# Patient Record
Sex: Female | Born: 1945 | Race: Black or African American | Hispanic: No | Marital: Married | Smoking: Never smoker
Health system: Southern US, Community
[De-identification: ages and names within clinical notes are randomized; demographics above are authoritative.]

## PROBLEM LIST (undated history)

## (undated) DIAGNOSIS — D649 Anemia, unspecified: Secondary | ICD-10-CM

## (undated) DIAGNOSIS — T7840XA Allergy, unspecified, initial encounter: Secondary | ICD-10-CM

## (undated) DIAGNOSIS — Z800 Family history of malignant neoplasm of digestive organs: Secondary | ICD-10-CM

## (undated) DIAGNOSIS — I10 Essential (primary) hypertension: Secondary | ICD-10-CM

## (undated) DIAGNOSIS — M1990 Unspecified osteoarthritis, unspecified site: Secondary | ICD-10-CM

## (undated) HISTORY — DX: Anemia, unspecified: D64.9

## (undated) HISTORY — PX: BUNIONECTOMY: SHX129

## (undated) HISTORY — PX: OTHER SURGICAL HISTORY: SHX169

## (undated) HISTORY — DX: Family history of malignant neoplasm of digestive organs: Z80.0

## (undated) HISTORY — PX: TOE SURGERY: SHX1073

## (undated) HISTORY — PX: COLONOSCOPY: SHX174

## (undated) HISTORY — DX: Allergy, unspecified, initial encounter: T78.40XA

---

## 2006-01-10 HISTORY — PX: JOINT REPLACEMENT: SHX530

## 2007-05-24 ENCOUNTER — Ambulatory Visit: Payer: Self-pay | Admitting: Orthopedic Surgery

## 2007-05-24 DIAGNOSIS — M23302 Other meniscus derangements, unspecified lateral meniscus, unspecified knee: Secondary | ICD-10-CM | POA: Insufficient documentation

## 2007-05-24 DIAGNOSIS — M25469 Effusion, unspecified knee: Secondary | ICD-10-CM | POA: Insufficient documentation

## 2007-05-24 DIAGNOSIS — M25569 Pain in unspecified knee: Secondary | ICD-10-CM | POA: Insufficient documentation

## 2007-05-24 DIAGNOSIS — M1710 Unilateral primary osteoarthritis, unspecified knee: Secondary | ICD-10-CM | POA: Insufficient documentation

## 2007-05-24 DIAGNOSIS — IMO0002 Reserved for concepts with insufficient information to code with codable children: Secondary | ICD-10-CM | POA: Insufficient documentation

## 2007-06-08 ENCOUNTER — Ambulatory Visit (HOSPITAL_COMMUNITY): Admission: RE | Admit: 2007-06-08 | Discharge: 2007-06-08 | Payer: Self-pay | Admitting: Orthopedic Surgery

## 2007-06-14 ENCOUNTER — Ambulatory Visit: Payer: Self-pay | Admitting: Orthopedic Surgery

## 2007-06-21 ENCOUNTER — Ambulatory Visit: Payer: Self-pay | Admitting: Orthopedic Surgery

## 2007-06-21 ENCOUNTER — Ambulatory Visit (HOSPITAL_COMMUNITY): Admission: RE | Admit: 2007-06-21 | Discharge: 2007-06-21 | Payer: Self-pay | Admitting: Orthopedic Surgery

## 2007-06-22 ENCOUNTER — Telehealth: Payer: Self-pay | Admitting: Orthopedic Surgery

## 2007-06-26 ENCOUNTER — Ambulatory Visit: Payer: Self-pay | Admitting: Orthopedic Surgery

## 2007-06-26 DIAGNOSIS — M23349 Other meniscus derangements, anterior horn of lateral meniscus, unspecified knee: Secondary | ICD-10-CM | POA: Insufficient documentation

## 2007-06-28 ENCOUNTER — Encounter: Payer: Self-pay | Admitting: Orthopedic Surgery

## 2007-07-11 ENCOUNTER — Telehealth: Payer: Self-pay | Admitting: Orthopedic Surgery

## 2007-07-19 ENCOUNTER — Ambulatory Visit: Payer: Self-pay | Admitting: Orthopedic Surgery

## 2007-08-16 ENCOUNTER — Telehealth: Payer: Self-pay | Admitting: Orthopedic Surgery

## 2007-08-20 ENCOUNTER — Ambulatory Visit: Payer: Self-pay | Admitting: Orthopedic Surgery

## 2007-09-24 ENCOUNTER — Ambulatory Visit (HOSPITAL_COMMUNITY): Admission: RE | Admit: 2007-09-24 | Discharge: 2007-09-24 | Payer: Self-pay | Admitting: Orthopedic Surgery

## 2007-09-24 ENCOUNTER — Telehealth: Payer: Self-pay | Admitting: Orthopedic Surgery

## 2007-09-24 ENCOUNTER — Ambulatory Visit: Payer: Self-pay | Admitting: Orthopedic Surgery

## 2007-09-24 DIAGNOSIS — I82409 Acute embolism and thrombosis of unspecified deep veins of unspecified lower extremity: Secondary | ICD-10-CM | POA: Insufficient documentation

## 2007-09-27 ENCOUNTER — Telehealth: Payer: Self-pay | Admitting: Orthopedic Surgery

## 2007-09-27 ENCOUNTER — Encounter: Payer: Self-pay | Admitting: Orthopedic Surgery

## 2007-11-28 ENCOUNTER — Ambulatory Visit: Payer: Self-pay | Admitting: Orthopedic Surgery

## 2007-11-28 DIAGNOSIS — M25819 Other specified joint disorders, unspecified shoulder: Secondary | ICD-10-CM | POA: Insufficient documentation

## 2007-11-28 DIAGNOSIS — M25519 Pain in unspecified shoulder: Secondary | ICD-10-CM | POA: Insufficient documentation

## 2007-11-28 DIAGNOSIS — M7580 Other shoulder lesions, unspecified shoulder: Secondary | ICD-10-CM

## 2008-01-28 ENCOUNTER — Telehealth: Payer: Self-pay | Admitting: Orthopedic Surgery

## 2008-01-30 ENCOUNTER — Ambulatory Visit: Payer: Self-pay | Admitting: Orthopedic Surgery

## 2008-01-30 DIAGNOSIS — M79604 Pain in right leg: Secondary | ICD-10-CM | POA: Insufficient documentation

## 2008-01-30 DIAGNOSIS — Q762 Congenital spondylolisthesis: Secondary | ICD-10-CM | POA: Insufficient documentation

## 2008-01-30 DIAGNOSIS — M5430 Sciatica, unspecified side: Secondary | ICD-10-CM | POA: Insufficient documentation

## 2008-01-30 DIAGNOSIS — M4800 Spinal stenosis, site unspecified: Secondary | ICD-10-CM | POA: Insufficient documentation

## 2008-01-30 DIAGNOSIS — M545 Low back pain: Secondary | ICD-10-CM

## 2008-01-30 DIAGNOSIS — M5416 Radiculopathy, lumbar region: Secondary | ICD-10-CM | POA: Insufficient documentation

## 2008-07-02 ENCOUNTER — Telehealth: Payer: Self-pay | Admitting: Orthopedic Surgery

## 2008-10-13 ENCOUNTER — Ambulatory Visit: Payer: Self-pay | Admitting: Orthopedic Surgery

## 2008-11-25 ENCOUNTER — Ambulatory Visit: Payer: Self-pay | Admitting: Orthopedic Surgery

## 2009-04-13 ENCOUNTER — Ambulatory Visit: Payer: Self-pay | Admitting: Orthopedic Surgery

## 2009-08-05 ENCOUNTER — Ambulatory Visit: Payer: Self-pay | Admitting: Orthopedic Surgery

## 2009-11-05 ENCOUNTER — Ambulatory Visit: Payer: Self-pay | Admitting: Orthopedic Surgery

## 2010-02-09 NOTE — Letter (Signed)
Summary: Historic Patient File  Historic Patient File   Imported By: Jacklynn Ganong 05/24/2007 14:57:22  _____________________________________________________________________  External Attachment:    Type:   Image     Comment:   history form

## 2010-02-09 NOTE — Assessment & Plan Note (Signed)
Summary: RT KNEE PAIN/REQ INJECTION/SELF-PAY/CAF   Visit Type:  Follow-up  CC:  right knee pain.  History of Present Illness: 60 years previous arthroscopy RIGHT knee in June of 2009 meniscectomy, had some arthritis  Comes in today complaining of pain in her back and leg as well as knee pain and increasing valgus deformity.  She would like an injection.  Current medications Norco 5 mg for pain  On July 27 I aspirated and injected the knee and it did well until about a week ago  Last film was 2009.  Knee exam shows increasing valgus deformity lateral joint line tenderness tenderness behind the knee and pain with weightbearing in front of the knee and an obvious limp favoring the RIGHT lower extremity.  Fortunately she has maintained her range of motion and strength in the limb and her ligaments are stable  Repeat injection RIGHT knee Verbal consent was obtained. The RIGHT knee was prepped with alcohol and ethyl chloride. 1 cc of depomedrol 40mg /cc and 4 cc of lidocaine 1% was injected. there were no complications.     Allergies: 1)  ! Celebrex   Impression & Recommendations:  Problem # 1:  LOWER LEG, ARTHRITIS, DEGEN./OSTEO (ICD-715.96) Assessment Deteriorated  The following medications were removed from the medication list:    Nabumetone 750 Mg Tabs (Nabumetone) .Marland Kitchen... 1 by mouth two times a day Her updated medication list for this problem includes:    Norco 5-325 Mg Tabs (Hydrocodone-acetaminophen) .Marland Kitchen... 1 by mouth q 4 as needed pain  Orders: Joint Aspirate / Injection, Large (20610) Depo- Medrol 40mg  (J1030)  Patient Instructions: 1)  You have received an injection of cortisone today. You may experience increased pain at the injection site. Apply ice pack to the area for 20 minutes every 2 hours and take 2 xtra strength tylenol every 8 hours. This increased pain will usually resolve in 24 hours. The injection will take effect in 3-10 days.  2)  Please schedule a  follow-up appointment as needed. Prescriptions: NORCO 5-325 MG TABS (HYDROCODONE-ACETAMINOPHEN) 1 by mouth q 4 as needed pain  #60 x 2   Entered and Authorized by:   Fuller Canada MD   Signed by:   Fuller Canada MD on 11/05/2009   Method used:   Print then Give to Patient   RxID:   [id number]    Orders Added: 1)  Joint Aspirate / Injection, Large [20610] 2)  Depo- Medrol 40mg  [J1030]

## 2010-02-09 NOTE — Progress Notes (Signed)
Summary: Patient's knee bothering her  Phone Note Call from Patient   Caller: Patient Summary of Call: Pt called c/o post op knee (from June 2009) "aching", following working 12 hrs on a few days last week; previously has been working reduced hours as advised.  States using ibuprofen. Asking if other recommendation or schedule appointment? If anything needs to be called in, pharmacy is [facility].   Please call pt on CELL # [phone]. Initial call taken by: Cammie Sickle,  January 28, 2008 3:08 PM  Follow-up for Phone Call        come in this week Follow-up by: Chasity Tereasa Coop,  January 28, 2008 3:22 PM  Additional Follow-up for Phone Call Additional follow up Details #1::        advised, and appt scheduled. Additional Follow-up by: Cammie Sickle,  January 28, 2008 4:30 PM

## 2010-02-09 NOTE — Assessment & Plan Note (Signed)
Summary: RT KNEE SWELLING/FLUID/POSS INJEC/[employer]/CAF   Visit Type:  Follow-up  CC:  right knee pain.  History of Present Illness: 60 years previous arthroscopy RIGHT knee in June of 2009 meniscectomy, have some arthritis  Currently on Norco 5 mg as needed for pain  She works 7 days a week complains of recurrent swelling pain and a back of the knee and along the joint line.  Currently using ibuprofen 800 twice a day and ice with equivocal results      Current Medications (verified): 1)  Amlodipine Besy-Benazepril Hcl 5-20 Mg  Caps (Amlodipine Besy-Benazepril Hcl) 2)  Neurontin 100 Mg Caps (Gabapentin) .Marland Kitchen.. 1 By Mouth Three Times A Day 3)  Norco 5-325 Mg Tabs (Hydrocodone-Acetaminophen) .Marland Kitchen.. 1 By Mouth Q 4 As Needed Pain 4)  Nabumetone 750 Mg Tabs (Nabumetone) .Marland Kitchen.. 1 By Mouth Two Times A Day  Allergies (verified): 1)  ! Celebrex  Physical Exam  Extremities:  large knee joint effusion, mildly antalgic gait.  Medial joint line tenderness.  Flexion arc is 120.  Motor strength grade 5.  Ligaments are normal and stable.  Sensation is normal and perfusion and pulse are normal Skin:  intact without lesions or rashes Psych:  alert and cooperative; normal mood and affect; normal attention span and concentration   Impression & Recommendations:  Problem # 1:  LOWER LEG, ARTHRITIS, DEGEN./OSTEO (ICD-715.96) Assessment Deteriorated  Her updated medication list for this problem includes:    Norco 5-325 Mg Tabs (Hydrocodone-acetaminophen) .Marland Kitchen... 1 by mouth q 4 as needed pain    Nabumetone 750 Mg Tabs (Nabumetone) .Marland Kitchen... 1 by mouth two times a day inject RIGHT knee after aspirating 30 cc of clear yellow fluid from a suprapatellar lateral approach  Verbal consent was obtained. The knee was prepped with alcohol and ethyl chloride. 1 cc of depomedrol 40mg /cc and 4 cc of lidocaine 1% was injected. there were no complications.  Orders: Est. Patient Level III (04540) Joint Aspirate / Injection,  Large (20610) Depo- Medrol 40mg  (J1030)  Patient Instructions: 1)  You have received an injection of cortisone today. You may experience increased pain at the injection site. Apply ice pack to the area for 20 minutes every 2 hours and take 2 xtra strength tylenol every 8 hours. This increased pain will usually resolve in 24 hours. The injection will take effect in 3-10 days.  2)  Please schedule a follow-up appointment as needed. Prescriptions: NORCO 5-325 MG TABS (HYDROCODONE-ACETAMINOPHEN) 1 by mouth q 4 as needed pain  #60 x 2   Entered and Authorized by:   Fuller Canada MD   Signed by:   Fuller Canada MD on 08/05/2009   Method used:   Print then Give to Patient   RxID:   [id number]

## 2010-02-09 NOTE — Progress Notes (Signed)
Summary: Call from patient c/o shoulder pain  ---- Converted from flag ---- ---- 07/02/2008 12:43 PM, Cammie Sickle wrote: advised patient. appointment scheduled.  ---- 07/02/2008 9:19 AM, Forde Dandy L Kandis Ban wrote: use ibuprofen and tylenol, use ice or heat which ever feels better, rest her shoulder  ---- 07/02/2008 8:59 AM, Cammie Sickle wrote: pt c/o shoulder pain (had injec+Xr at 11/28/07 visit) - wants injec - I offered her a couple of options for appts in am; states can only come pm Any recommendations in meantime till I can find a slot? ------------------------------

## 2010-02-09 NOTE — Assessment & Plan Note (Signed)
Summary: 2 m RE-CK KNEE/HAD KNEE SURG 06/21/07/BCBS/CAF    History of Present Illness: I saw Kaylee Warner in the office today for a 2 month followup visit.  She is a 65 years old woman with the complaint of:  post op right knee.  Patient states her knee is doing fine. She wants to try some arthritis medicine, but she cannot take Celebrex because it ran her blood pressure up. She aches at night. She tried the Lyrica , but she could not tell if it helped and she lost her prescription.   right knee post arthroscopy, DOS 06-21-07. she did have quite a bit of arthritis but at this point this is stable.  Her main complaint today is right shoulder pain exacerbated by forward elevation worse at night and with overhead activity    Updated Prior Medication List: AMLODIPINE BESY-BENAZEPRIL HCL 5-20 MG  CAPS (AMLODIPINE BESY-BENAZEPRIL HCL)  NABUMETONE 500 MG TABS (NABUMETONE) 1 by mouth two times a day  Current Allergies (reviewed today): No known allergies   Past Medical History:    Reviewed history from 05/24/2007 and no changes required:       htn  Past Surgical History:    Reviewed history from 09/24/2007 and no changes required:       [facility] 2009 Dr Romeo Apple      Review of Systems       improved knee function   Physical Exam  Constitutional: normal appearance   CDV: normal pulse and perfusion  Skin: normal  Neuro: normal sensation  Psyche: awake and alert  MSK:  .right knee examination reveals preserved knee flexion of 120 with near full extension no pain no swelling no tenderness  Right shoulder examination The shoulder is tender over the posterrolateral acromion, there is no swelling, the shoulder is stable, the SubScap is 5/5, the EXT/ROT are 5/5, the SSpinatus is 4/5. The impingement sign is positive. ROM:  FLEXION=  180 vs 150 (P/A)         ABDUCTION=          Impression & Recommendations:  Problem # 1:  SHOULDER PAIN (ICD-719.41) Assessment: New  Right  shoulder Verbal consent obtained/The shoulder was injected with depomedrol 40mg /cc and sensorcaine .25% . There were no complications  The following medications were removed from the medication list:    Norco 5-325 Mg Tabs (Hydrocodone-acetaminophen) .Marland Kitchen... 1-2 by mouth q4 as needed  Her updated medication list for this problem includes:    Nabumetone 500 Mg Tabs (Nabumetone) .Marland Kitchen... 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 1 by mouth two times a day  Other Orders: Est. Patient Level III (56213)   Patient Instructions: 1)  Please schedule a follow-up appointment in 6 months. 2)  x-rays of the right knee  3)  You have received an injection of cortisone today. You may experience increased pain at the injection site. Apply ice pack to the area for 20 minutes every 2 hours and take 2 xtra strength tylenol every 8 hours. This increased pain will usually resolve in 24 hours. The injection will take effect in 3-10 days.    Prescriptions: NABUMETONE 500 MG TABS (NABUMETONE) 1 by mouth two times a day  #60 x 5  Entered and Authorized by:   Fuller Canada MD   Signed by:   Fuller Canada MD on 11/28/2007   Method used:   Print then Give to Patient   RxID:   [id number]  ]

## 2010-02-09 NOTE — Assessment & Plan Note (Signed)
Summary: 4 week recheck knee/post op/bsf    History of Present Illness: I saw Kaylee Warner in the office today for a followup visit.  She is a 65 years old woman with the complaint of:  4 week recheck on right knee one month recheck on right knee post arthroscopy, DOS 06-21-07.   Rx: ice, ibuprofen and celebrex.   Wants refill on hydrocodone 7.5/650, helps.  Celebrex makes her BP run high, stopped taking that.  Right lower leg swelling for a week, no injury.   HISTORY   Last visit was advised to take one celebrex and one ultram a day for knee pain and ice for the swelling since she wanted to return to work early.  The fluid came back last week after working 6 hrs straight.  She is very clear that her preop pain has improved. We talked about surgical options in the future and we both agreed that she would like to keep this nonsurgical at least for 3 years; we may or may not make it to that point      Updated Prior Medication List: AMLODIPINE BESY-BENAZEPRIL HCL 5-20 MG  CAPS (AMLODIPINE BESY-BENAZEPRIL HCL)  NORCO 5-325 MG TABS (HYDROCODONE-ACETAMINOPHEN) 1-2 by mouth q4 as needed  Current Allergies: No known allergies   Past Medical History:    Reviewed history from 05/24/2007 and no changes required:       htn  Past Surgical History:    Reviewed history from 05/24/2007 and no changes required:       [facility] 2009 Dr Romeo Apple       Physical Exam  The knee actually looks good  No effusion No tenderness  the leg from the back of knee down is tender, in the calf and in the leg  the pulses are intact and both feet seem cool to touch     Impression & Recommendations:  Problem # 1:  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 Patient Level III (47829)   Medications Added to Medication List This Visit: 1)  Norco 5-325 Mg Tabs (Hydrocodone-acetaminophen) .Marland Kitchen.. 1-2 by mouth q4 as needed   Patient Instructions: 1)   Ultrasound for right leg r/o DVT  2)  if positive we will treat  3)  if negative just take the hydrocodone and   4)  return in 2 months    Prescriptions: NORCO 5-325 MG TABS (HYDROCODONE-ACETAMINOPHEN) 1-2 by mouth q4 as needed  #60 x 2   Entered and Authorized by:   Fuller Canada MD   Signed by:   Fuller Canada MD on 09/24/2007   Method used:   Print then Give to Patient   RxID:   [id number]  ]

## 2010-02-09 NOTE — Letter (Signed)
Summary: Surg order RT arth sched 06/21/07  Surg order RT arth sched 06/21/07   Imported By: Cammie Sickle 06/27/2007 17:56:58  _____________________________________________________________________  External Attachment:    Type:   Image     Comment:   External Document

## 2010-02-09 NOTE — Assessment & Plan Note (Signed)
Summary: mri results/disc here/bsf    History of Present Illness: I saw Kaylee Warner in the office today for a followup visit.  She is a 65 years old woman with the complaint of:  MRI results from [facility] on 06-08-07 of right knee.  Knee is somewhat better.  Pain level today is around 7 with standing, walking.  Rest and Ibuprofen helps.   IMPRESSION:   1.  Complex degenerative type tear involving the anterior horn and midbody regions of the lateral meniscus. 2.  Degenerative changes involving the the posterior horn of the medial meniscus without discrete tear. 3.  Intact ligamentous structures and no acute bony findings. 4.  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 4.  Low large joint effusion and moderate sized partially ruptured Baker's cyst. 5.  Septated cystic structure in the posterior joint space is most likely a ganglion cyst.  The   Read By:  Cyndie Chime,  M.D.     Released By:  Pecolia Ades,       Updated Prior Medication List: AMLODIPINE BESY-BENAZEPRIL HCL 5-20 MG  CAPS (AMLODIPINE BESY-BENAZEPRIL HCL)  FEXOFENADINE HCL 180 MG  TABS (FEXOFENADINE HCL)  INDOMETHACIN 50 MG  CAPS (INDOMETHACIN)   Current Allergies: No known allergies   Past Medical History:    Reviewed history from 05/24/2007 and no changes required:       htn  Past Surgical History:    Reviewed history from 05/24/2007 and no changes required:       none   Family History:    Reviewed history from 05/24/2007 and no changes required:       Family History of Diabetes       Family History of Arthritis  Social History:    Reviewed history from 05/24/2007 and no changes required:       Patient is married.        cook   Risk Factors: Tobacco use:  never Caffeine use:  2 drinks per day Alcohol use:  no    Physical Exam  Constitutional: vital signs see recorded values. General: normal development,  nutrition, and grooming. No deformity. Body Habitus is medium. CDV: Observation and palpation was normal  Lymph: palpation of the lymph nodes were normal Skin: inspection and palpation of the skin revealed no abnormalities  Neuro: coordination: normal              DTR's normal              Sensation was normal  Psyche: Mood was normal.  Affect: normal  MSK: Gait: ab- normal    The upper extremities have normal appearance, ROM, strength and stability.   The left knee has range of motion zero 125 with no ligamentous instability no tenderness swelling. Muscle function and tone normal. Alignment seems normal.  In contrast the right knee has swelling 5-10 loss of extension flexion only to 90 joint effusion. Perhaps valgus malalignment. Tenderness over the medial joint line. Positive McMurray sign. Muscle tone is normal.      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  Prognosis guarded due to arthritis.  I discussed this with her.  She agrees to go ahead with surgery   Orders: Est. Patient Level IV (40102)   Medications Added to Medication List This Visit: 1)  Celebrex 200 Mg Caps (Celecoxib) .Marland Kitchen.. 1 daily   Patient Instructions: 1)  Informed consent process: I have discussed the procedure with the patient. I have answered their questions. The risks of bleeding, infection, nerve and vascualr injury have been discussed. The diagnosis and reason for surgery have been explained. The patient demonstrates understanding of this discussion. Specific to this procedure risks include: pain swelling stiffness 2)  surgery June 11, post op June 16   Prescriptions: CELEBREX 200 MG  CAPS (CELECOXIB) 1 daily  #60 x 5   Entered and Authorized by:   Fuller Canada MD   Signed by:   Fuller Canada MD on 06/14/2007    Method used:   Print then Give to Patient   RxID:   [id number]  ]

## 2010-02-09 NOTE — Assessment & Plan Note (Signed)
Summary: RT SHOULDER PAIN,REQ INJEC/CAF   Visit Type:  Follow-up  CC:  rt shoulder pain .  History of Present Illness: I saw Kaylee Warner in the office today for a followup visit.  She is a 65 years old woman with the complaint of:  right shoulder pain.  The patient requests an injection for what is presumed to be shoulder impingement syndrome  She is also on Relafen 750 mg Norco 5 mg for pain and needs a refill  She continues to have night pain and pain with forward elevation.  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  She does have full passive range of motion there is some pain in the arc of motion of 120 plus, and the shoulder remained stable, the supraspinatus strength remains normal.  Skin is intact.  She is oriented times per some placement and affect are normal.    RIGHT shoulder subacromial injection Verbal consent obtained/The shoulder was injected with depomedrol 40mg /cc and sensorcaine .25% . There were no complications  Exercise program emphasized Norco for pain Follow up as needed    Allergies: 1)  ! 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 Patient Level III (28413) Joint Aspirate / Injection, Large (20610) Depo- Medrol 40mg  (J1030)  Problem # 2:  SHOULDER PAIN (ICD-719.41) Assessment: Deteriorated  Her updated medication list for this problem includes:    Norco 5-325 Mg Tabs (Hydrocodone-acetaminophen) .Marland Kitchen... 1 by mouth q 4 as needed pain    Nabumetone 750 Mg Tabs (Nabumetone) .Marland Kitchen... 1 by mouth two times a day  Orders: Est. Patient Level III (24401) Joint Aspirate / Injection, Large (20610) Depo- Medrol 40mg  (J1030)  Patient Instructions: 1)  You have received an injection of cortisone today. You  may experience increased pain at the injection site. Apply ice pack to the area for 20 minutes every 2 hours and take 2 xtra strength tylenol every 8 hours. This increased pain will usually resolve in 24 hours. The injection will take effect in 3-10 days.  2)  Please schedule a follow-up appointment as needed. Prescriptions: NORCO 5-325 MG TABS (HYDROCODONE-ACETAMINOPHEN) 1 by mouth q 4 as needed pain  #40 x 1   Entered and Authorized by:   Fuller Canada MD   Signed by:   Fuller Canada MD on 04/13/2009   Method used:   Print then Give to Patient   RxID:   [id number]

## 2010-02-09 NOTE — Assessment & Plan Note (Signed)
Summary: POST OP 1/ARTH RT KNEE/CAF    History of Present Illness: I saw Kaylee Warner in the office today for a followup visit.  She is a 65 years old woman with the complaint of:  post op #1 right knee.  DOS 06-21-07. Right knee arthroscopy, partial lateral meniscectomy.  Patient states that she feels okay, she has not started physical therapy.  She is taking lorcet plus and she has constipation.    Current Allergies: No known allergies       Physical Exam  the patient's knee is slightly swollen but looks good. Dressing changed ace wrap applied.    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 1 daily  Orders: Post-Op Check (84132)   Problem # 3:  DERANGEMENT OF ANTERIOR HORN OF LATERAL MENISCUS (ICD-717.42) Assessment: Improved  Orders: Post-Op Check (44010)   Medications Added to Medication List This Visit: 1)  Colace 100 Mg Caps (Docusate sodium) .Marland Kitchen.. 1 by mouth two times a day   Patient Instructions: 1)  take Milk of Magnesia 30 cc daily until bowel movement  2)  Resume celebrex  3)  Colace new prescription  4)  start PT  5)  return 3 weeks   Prescriptions: COLACE 100 MG  CAPS (DOCUSATE SODIUM) 1 by mouth two times a day  #60 x 0   Entered and Authorized by:   Fuller Canada MD   Signed by:   Fuller Canada MD on 06/26/2007   Method used:   Print then Give to Patient   RxID:   [id number] COLACE 100 MG  CAPS (DOCUSATE SODIUM) 1 by mouth two times a day  #60 x 0   Entered and Authorized by:   Fuller Canada MD   Signed by:   Fuller Canada MD on 06/26/2007   Method used:   Print then Give  to Patient   RxID:   [id number]  ]

## 2010-02-09 NOTE — Medication Information (Signed)
Summary: Tax adviser   Imported By: Elvera Maria 10/12/2007 09:01:57  _____________________________________________________________________  External Attachment:    Type:   Image     Comment:   hose

## 2010-02-09 NOTE — Progress Notes (Signed)
Summary: RE: U/S Results per Dr Romeo Apple  ---- Converted from flag ---- ---- 09/24/2007 2:00 PM, Cammie Sickle wrote: BEST PH # TO CALL PT W/ RESULTS U/S - Cell# [phone] ------------------------------  Phone Note Outgoing Call   Call placed to: Patient Summary of Call: As per Dr Alba Destine of U/S: results negative. Per Dr Romeo Apple, call pt to report:  Done Initial call taken by: Cammie Sickle,  September 24, 2007 5:30 PM

## 2010-02-09 NOTE — Assessment & Plan Note (Signed)
Summary: RE-CK RT SHOULDER/[employer]BS/CAF   Visit Type:  Follow-up  CC:  shoulder.  History of Present Illness: I saw Kaylee Warner in the office today for a followup visit.  She is a 65 years old woman with the complaint of:    DX: Right shoulder Impingement Syndrome.  Treatment: injection 6 weeks ago.  MEDS:  Norco 5 and Relafen 750 two times a day, doing good with the meds.  Complaints: doing better, around 80 percent better, ROM is better.  Today, scheduled for:  6 week recheck right shoulder after injection.   Allergies: 1)  ! Celebrex  Physical Exam  Additional Exam:  full forward elevation, abduction, external rotation with mild decrease in internal rotation RIGHT shoulder. No tenderness.   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 Patient Level II (04540)  Patient Instructions: 1)  Please schedule a follow-up appointment as needed.

## 2010-02-09 NOTE — Assessment & Plan Note (Signed)
Summary: RT KNEE PAIN,SWELLING/NO FILM/BCBS/CAF   Vital Signs:  Patient Profile:   65 Years Old Female Weight:      193 pounds Pulse rate:   88 / minute Resp:     16 per minute  Vitals Entered By: Fuller Canada MD (May 24, 2007 9:25 AM)                 Chief Complaint:  right knee pain.  History of Present Illness: I saw Kaylee Warner in the office today for an initial visit.  She is a 42 years black female with the complaint of:  right knee pain  05-24-07 went to [facility] and had fluid drawn off of knee       This is a 65 year old woman who presents with 3 weeks ofknee pain, no trauma.  The patient complains of left knee pain, but denies right knee pain, bilateral knee pain, left hip pain, right hip pain, bilateral hip pain, left ankle pain, right ankle pain, and bilateral ankle pain.  Patient notes history of grinding, swelling..  Previous treatment has included seen in [facility] hospital, seen by Primary Ross Ludwig still where she received an injection, and medications:ibuprofen 800 mg which helps, also received Indocin 50 mg which made her feel funny.  Knee pain improves with ibuprofen: and ice.  Diagnostics to date include plain films.      Updated Prior Medication List: AMLODIPINE BESY-BENAZEPRIL HCL 5-20 MG  CAPS (AMLODIPINE BESY-BENAZEPRIL HCL)  FEXOFENADINE HCL 180 MG  TABS (FEXOFENADINE HCL)  INDOMETHACIN 50 MG  CAPS (INDOMETHACIN)   Current Allergies (reviewed today): No known allergies   Past Medical History:    htn  Past Surgical History:    none   Family History:    Family History of Diabetes    Family History of Arthritis  Social History:    Patient is married.     cook   Risk Factors:  Tobacco use:  never Caffeine use:  2 drinks per day Alcohol use:  no   Review of Systems  General      Complains of weight gain.      Denies weight loss, fever, chills, and fatigue.  Cardiac      Complains of poor circulation.      Denies chest pain,  angina, heart attack, heart failure, blood clots, and phlebitis.  Resp      Denies short of breath, difficulty breathing, COPD, cough, and pneumonia.  GI      Complains of constipation and difficulty swallowing.      Denies nausea, vomiting, diarrhea, ulcers, GERD, and reflux.  GU      Denies kidney failure, kidney transplant, kidney stones, burning, poor stream, testicular cancer, blood in urine, and .  Neuro      Complains of dizziness.      Denies headache, migraines, numbness, weakness, tremor, and unsteady walking.  MS      Complains of joint pain and joint swelling.      Denies rheumatoid arthritis, gout, bone cancer, osteoporosis, and .  Endo      Denies thyroid disease, goiter, and diabetes.  Psych      Complains of depression.      Denies mood swings, anxiety, panic attack, bipolar, and schizophrenia.  Derm      Denies eczema, cancer, and itching.  EENT      Denies poor vision, cataracts, glaucoma, poor hearing, vertigo, ears ringing, sinusitis, hoarseness, toothaches, and bleeding gums.  Immunology  Complains of sinus problems.      Denies seasonal allergies and allergic to bee stings.  Lymphatic      Denies lymph node cancer and lymph edema.   Physical Exam  Constitutional: vital signs see recorded values. General: normal development, nutrition, and grooming. No deformity. Body Habitus is medium. CDV: Observation and palpation was normal  Lymph: palpation of the lymph nodes were normal Skin: inspection and palpation of the skin revealed no abnormalities  Neuro: coordination: normal              DTR's normal              Sensation was normal  Psyche: Mood was normal.  Affect: normal  MSK: Gait: ab- normal    The upper extremities have normal appearance, ROM, strength and stability.   The left knee has range of motion zero 125 with no ligamentous instability no tenderness swelling. Muscle function and tone normal. Alignment seems normal.  In  contrast the right knee has swelling 5-10 loss of extension flexion only to 90 joint effusion. Perhaps valgus malalignment. Tenderness over the medial joint line. Positive McMurray sign. Muscle tone is normal.      Impression & Recommendations:  Problem # 1:  KNEE PAIN (BJY-782.95) Assessment: New  Her updated medication list for this problem includes:    Indomethacin 50 Mg Caps (Indomethacin)  Orders: New Patient Level IV (62130) Knee x-ray,  3 views (86578)   Problem # 2:  JOINT EFFUSION, RIGHT KNEE (ICD-719.06) Assessment: New  Orders: New Patient Level IV (46962)   Problem # 3:  DERANGEMENT MENISCUS (ICD-717.5)  Orders: New Patient Level IV (95284)   Problem # 4:  LOWER LEG, ARTHRITIS, DEGEN./OSTEO (ICD-715.96) Assessment: New  Her updated medication list for this problem includes:    Indomethacin 50 Mg Caps (Indomethacin)  Orders: New Patient Level IV (13244) Knee x-ray,  3 views (73562)radiographs taken in the office. Degenerative changes are seen the patellofemoral joint there may be some degenerative changes in the tibiofemoral joint as well. impression is arthritis.  Plan: continue to ice, ibuprofen, relative rest. MRI to evaluate for meniscal tear with strong clinical symptoms and failure of ibuprofen, Indocin, injection.  Orders: New Patient Level IV (01027) Knee x-ray,  3 views (25366)   Medications Added to Medication List This Visit: 1)  Amlodipine Besy-benazepril Hcl 5-20 Mg Caps (Amlodipine besy-benazepril hcl) 2)  Fexofenadine Hcl 180 Mg Tabs (Fexofenadine hcl) 3)  Indomethacin 50 Mg Caps (Indomethacin)   Patient Instructions: 1)  Continue ice and ibuprofen  2)  MRI will be scheduled after the mri you will come back here for results and plan of treatment    ]

## 2010-02-24 ENCOUNTER — Telehealth: Payer: Self-pay | Admitting: Orthopedic Surgery

## 2010-03-03 NOTE — Progress Notes (Signed)
Summary: refill on norco 5 for a month  Phone Note Call from Patient   Caller: Kaylee Warner Summary of Call: would like to know if you can send her pain medication into [facility].  She states they have sent a fax however Steward Drone or I saw the fax.  Her number is [phone], and she said she was waiting on Medicaid to kick in before she came in to see the Doctor. Initial call taken by: Curtis Sites,  February 24, 2010 4:11 PM    Prescriptions: NORCO 5-325 MG TABS (HYDROCODONE-ACETAMINOPHEN) 1 by mouth q 4 as needed pain  #60 x 2   Entered by:   Ether Griffins   Authorized by:   Fuller Canada MD   Signed by:   Ether Griffins on 02/24/2010   Method used:   Telephoned to ...         RxID:   [id number]

## 2010-04-26 ENCOUNTER — Encounter: Payer: Self-pay | Admitting: Orthopedic Surgery

## 2010-05-12 ENCOUNTER — Ambulatory Visit: Payer: Self-pay | Admitting: Orthopedic Surgery

## 2010-05-12 ENCOUNTER — Encounter: Payer: Self-pay | Admitting: Orthopedic Surgery

## 2010-05-25 NOTE — H&P (Signed)
NAME:  Kaylee Warner, Kaylee Warner               ACCOUNT NO.:  [id number]   MEDICAL RECORD NO.:  [id number]          PATIENT TYPE:  AMB   LOCATION:  DAY                           FACILITY:  [facility]   PHYSICIAN:  Vickki Hearing, M.D.DATE OF BIRTH:  05/14/1945   DATE OF ADMISSION:  DATE OF DISCHARGE:  LH                              HISTORY & PHYSICAL   CHIEF COMPLAINT:  Right knee pain.  Surgery scheduled for June 21, 2007,  for arthroscopy right knee at [facility].   This is a 65 year old black female who started having right knee pain  back in May 2009.  She was seen in the emergency room on 1 occasion to  have fluid drawn from the knee.  She complains of gradual onset of pain  without trauma,  denies any radicular symptoms.  Notes history of  grinding and swelling.  She has also received an injection, been taking  ibuprofen, received Indocin.  She had plain films as well, which showed  mild degenerative changes in the patellofemoral joint and mild  degenerative changes in the tibiofemoral joint.  She was evaluated with  MRI, which was reviewed and showed complex degenerative tear anterior  horn and mid body of lateral meniscus, degenerative changes in the  posterior horn of the medial meniscus, but no tear.  Ligaments intact.  Three compartment arthritis, especially lateral and patellofemoral  joint.  Large joint effusion with moderate-sized partially ruptured  Baker's cyst.   Informed consent was done in the office.  Risk of stiffness, swelling,  and continued pain from arthritis and possible retear of meniscus noted.   MEDICATIONS:  1. Amlodipine 5-20 mg daily.  2. Fexofenadine 180 mg.   ALLERGIES:  No known drug allergies.   MEDICAL HISTORY:  Hypertension.   SURGICAL HISTORY:  No previous surgery.   FAMILY HISTORY:  Diabetes and arthritis.   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  No tobacco use.  Two drinks per day of caffeine.  No  alcohol use.   REVIEW OF SYSTEMS:  Denied constitutional symptoms,  cardiac,  respiratory, and GI symptoms with exception of occasional constipation  and difficulty swallowing.  Denied GU symptoms, complained of some  dizziness, joint pain, and depression.  Denied thyroid disease, goiter,  diabetes, eczema, cancer, itching, ear, nose, and throat problems.  Denies seasonal allergies.  Does have some sinusitis.  Lymph system:  Denied cancer and lymphedema.   PHYSICAL EXAM:  VITAL SIGNS:  Weight 193, pulse 88, respiratory rate 16.  CONSTITUTIONAL SIGNS:  Include normal development, nutrition, grooming,  and hygiene.  No deformity.  Body habitus medium.  CARDIOVASCULAR EXAM:  Showed normal palpation with good pulse perfusion.  Observed no swelling.  LYMPH NODES:  Negative.  SKIN INSPECTION:  Palpation revealed no abnormalities.  SENSATION:  Intact.  PSYCH:  Mood and affect were normal.  NEUROLOGIC:  She was alert and oriented x3.  Gait was abnormal.  She  favored the right leg with antalgic gait pattern.  EXTREMITIES:  Upper extremities were normal in appearance.  Range  of  motion, strength, stability.  Deep tendon reflexes were normal.   Left knee range of motion was 125.  No ligamentous instability, no  tenderness, swelling; and muscle function and tone were normal.  Alignment seemed normal.   In contrast, the right knee showed swelling with a 5-10 degrees loss of  extension, flexion to 90 degrees perhaps valgus malalignment.  Tenderness over the medial lateral joint lines with positive McMurray  sign.  Muscle tone was normal.   IMPRESSION:  1. Knee pain.  2. Joint effusion.  3. Derangement of meniscus  4. Arthritis.   PLAN:  1. Arthroscopy, right knee.  2. Partial lateral meniscectomy.   The patient will follow up on next Tuesday, June 26, 2007.      Vickki Hearing, M.D.  Electronically Signed     SEH/MEDQ  D:  06/20/2007  T:  06/21/2007  Job:  [id number]

## 2010-05-25 NOTE — Op Note (Signed)
NAME:  Kaylee Warner, Kaylee Warner NO.:  [id number]   MEDICAL RECORD NO.:  [id number]          PATIENT TYPE:  AMB   LOCATION:  DAY                           FACILITY:  [facility]   PHYSICIAN:  Vickki Hearing, M.D.DATE OF BIRTH:  1945-05-02   DATE OF PROCEDURE:  06/21/2007  DATE OF DISCHARGE:                               OPERATIVE REPORT   The history is as follows; this is a 65 year old female who was started  having right knee pain in May 2009.  She was seen in the emergency room  on two occasions.  On one occasion, an aspiration of the knee revealed  inflammatory fluid.  She complained of gradual onset of pain with no  major trauma, denied any radicular pain, had grinding swelling.  Symptoms were unrelieved by injection, ibuprofen, Indocin, rest, and  activity modification.   MRI showed a complex degenerative tear in anterior horn and body of  lateral meniscus with some degenerative changes in the three  compartments, especially lateral and patellofemoral.   PREOPERATIVE DIAGNOSES:  Lateral meniscal tear, osteoarthritis of the  right knee.   POSTOPERATIVE DIAGNOSIS:  Lateral meniscal tear, osteoarthritis of the  right knee.   PROCEDURES:  1. Arthroscopy right knee.  2. Partial lateral meniscectomy.   SURGEON:  Vickki Hearing, MD.   ANESTHETIC:  General by LMA.   OPERATIVE FINDINGS:  There was a complex tear of the mid body and  anterior horn of the lateral meniscus associated with grade 4 changes of  the lateral tibial plateau and grade 2 changes of the lateral femoral  condyle and medial compartment was essentially clean with grade 4 and 3  changes of the trochlea and patella respectively.   COMPLICATIONS:  None.   BLOOD LOSS:  Minimal.   COUNTS:  Correct.   The procedure was done as follows; the patient was identified as Kaylee Warner.  Her right knee was marked by the patient and countersigned by  the surgeon.  The history and physical was updated.   The patient was  taken to surgery, had IV antibiotics 1 g of Ancef and general LMA  anesthesia.  After successful induction of anesthesia, the right leg was  placed in an arthroscopic leg holder.  The left leg was placed and  padded in a well-leg holder.   The right lower extremity was then prepped with DuraPrep and draped  sterilely.   The time-out procedure was then performed.  The planned arthroscopy of  the right knee with lateral meniscal tear was verbalized along with  confirmation of antibiotics equipment imaging and all agreed.   The portal sites were injected with dilute Marcaine with epinephrine  solution.  A lateral portal was established with an 11 blade.  The scope  was introduced into the lateral compartment to the medial compartment  where diagnostic arthroscopy was started and completed by touring the  knee.  Through the medial portal, a probe was placed and the intra-  articular structures were evaluated visually and manually with a probe.  The lateral meniscal tear was flipped into the notch and then  a portion  was still folded onto the body of the meniscus.  The lateral tibial  plateau had a grade 4 fissure and the lateral femoral condyle had  diffuse grade 3 changes.   The patellofemoral joint was then evaluated where a grade 4 changes were  noted on the trochlea.  A grade 2 changes were noted on the patella.   Using a combination of instruments which included a duckbill forceps,  curved shaver, and 90-degree ArthroCare wand, the meniscal tear was  resected.  The meniscus was balanced until a stable rim was obtained.  No major chondroplasties were done.  The knee was irrigated and closed  with Steri-Strips.  We did inject Marcaine with epinephrine into the  joint, applied sterile dressings, Ace bandage, and cryo cuff, and the  patient was extubated and taken to recovery room in stable condition.   PLAN:  Physical therapy to be ordered on Tuesday during her  followup to  be done at [facility].  She was given Lorcet Plus and Phenergan for pain  and nausea.  She is instructed to remove her dressings tomorrow, apply  Band-Aids, and start range of motion exercises.  She is full  weightbearing with a walker.      Vickki Hearing, M.D.  Electronically Signed     SEH/MEDQ  D:  06/21/2007  T:  06/22/2007  Job:  [id number]

## 2010-06-22 ENCOUNTER — Ambulatory Visit (INDEPENDENT_AMBULATORY_CARE_PROVIDER_SITE_OTHER): Payer: Medicare Other | Admitting: Orthopedic Surgery

## 2010-06-22 DIAGNOSIS — M719 Bursopathy, unspecified: Secondary | ICD-10-CM

## 2010-06-22 DIAGNOSIS — M67919 Unspecified disorder of synovium and tendon, unspecified shoulder: Secondary | ICD-10-CM

## 2010-06-22 DIAGNOSIS — M75101 Unspecified rotator cuff tear or rupture of right shoulder, not specified as traumatic: Secondary | ICD-10-CM | POA: Insufficient documentation

## 2010-06-22 MED ORDER — NABUMETONE 750 MG PO TABS: 750.0000 mg | ORAL_TABLET | Freq: Two times a day (BID) | ORAL | Status: DC

## 2010-06-22 MED ORDER — HYDROCODONE-ACETAMINOPHEN 5-325 MG PO TABS: 1.0000 | ORAL_TABLET | ORAL | Status: AC | PRN

## 2010-06-22 NOTE — Progress Notes (Signed)
   Followup visit.  RIGHT shoulder pain.  Previous imaging x-rays no fracture, dislocation, or arthritis.  Impression rotator cuff syndrome.  Returns today for possible repeat injection.  Painful forward elevation, no real weakness. No neck pain no numbness no tingling.  Review of systems otherwise negative.  Forward elevation strength is normal. Positive impingement sign. Neurovascular exam is normal.  RIGHT shoulder subacromial injection.  Continue Relafen and Norco for pain.  Followup as needed

## 2010-06-22 NOTE — Procedures (Signed)
Separately identifiable procedure report  Informed consent was obtained verbally.  Time out was taken.  RIGHT shoulder was injected subacromial space.   Alcohol was used to prep the shoulder, along with ethyl chloride.  40 mg of Depo-Medrol and 3 cc of 1% lidocaine was injected into the subacromial space.  There were no complications 

## 2010-06-22 NOTE — Patient Instructions (Signed)
You have received a steroid shot. 15% of patients experience increased pain at the injection site with in the next 24 hours. This is best treated with ice and tylenol extra strength 2 tabs every 8 hours. If you are still having pain please call the office.    

## 2010-10-07 LAB — CBC
HCT: 34.4 — ABNORMAL LOW
Hemoglobin: 11.4 — ABNORMAL LOW
MCHC: 33.1
MCV: 75.2 — ABNORMAL LOW
Platelets: 249
RBC: 4.58
RDW: 17.2 — ABNORMAL HIGH
WBC: 5.9

## 2010-10-07 LAB — BASIC METABOLIC PANEL
BUN: 12
CO2: 24
Calcium: 9.7
Chloride: 108
Creatinine, Ser: 0.68
GFR calc Af Amer: >60
GFR calc non Af Amer: >60
Glucose, Bld: 84
Potassium: 3.6
Sodium: 140

## 2011-03-02 ENCOUNTER — Encounter: Payer: Self-pay | Admitting: Orthopedic Surgery

## 2011-03-02 ENCOUNTER — Ambulatory Visit (INDEPENDENT_AMBULATORY_CARE_PROVIDER_SITE_OTHER): Payer: Medicare Other | Admitting: Orthopedic Surgery

## 2011-03-02 DIAGNOSIS — M25569 Pain in unspecified knee: Secondary | ICD-10-CM | POA: Insufficient documentation

## 2011-03-02 DIAGNOSIS — M67919 Unspecified disorder of synovium and tendon, unspecified shoulder: Secondary | ICD-10-CM

## 2011-03-02 DIAGNOSIS — M75101 Unspecified rotator cuff tear or rupture of right shoulder, not specified as traumatic: Secondary | ICD-10-CM

## 2011-03-02 DIAGNOSIS — G5790 Unspecified mononeuropathy of unspecified lower limb: Secondary | ICD-10-CM | POA: Insufficient documentation

## 2011-03-02 MED ORDER — IBUPROFEN 800 MG PO TABS: 800.0000 mg | ORAL_TABLET | Freq: Three times a day (TID) | ORAL | Status: DC | PRN

## 2011-03-02 MED ORDER — HYDROCODONE-ACETAMINOPHEN 5-325 MG PO TABS: 1.0000 | ORAL_TABLET | ORAL | Status: DC | PRN

## 2011-03-02 MED ORDER — GABAPENTIN 100 MG PO CAPS: 100.0000 mg | ORAL_CAPSULE | Freq: Three times a day (TID) | ORAL | Status: DC

## 2011-03-02 NOTE — Progress Notes (Signed)
Patient ID: Kaylee Warner, female   DOB: Dec 11, 1945, 66 y.o.   MRN: [id number] Chief Complaint  Patient presents with  . Follow-up    Right knee pain.       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  Today however she would like an injection in her RIGHT shoulder and she would like to wait in terms of getting MRI of her back and just refill her medications which are hydrocodone Neurontin and ibuprofen  Review of systems she does note next if this and pain in the lumbar spine but no red flags.  She still has quite a bit of pain in the RIGHT shoulder especially at night and with forward elevation  BP 104/68  Ht 5\' 9"  (1.753 m)  Wt 177 lb (80.287 kg)  BMI 26.14 kg/m2 The exam shows that she indeed has decreased range of motion in the cervical spine there is increased muscle tension.  Has a positive impingement sign in the RIGHT shoulder despite normal internal and external rotation.  She does have forward elevation limitations of 150 with a positive impingement sign.  Inject RIGHT shoulder subacromial space  Refill 3 medications listed.

## 2011-03-02 NOTE — Patient Instructions (Signed)
You have been scheduled for an MRI scan.  Your insurance company requires advocate precertification prior to scheduling the MRI.  If her MRI scan is not improved we will let you know and make further treatment recommendations according to your insurance's guidelines.   We will call you with the results and review a new treatment plan

## 2011-03-02 NOTE — Progress Notes (Signed)
Shoulder Injection Procedure Note   Pre-operative Diagnosis: right  RC Syndrome  Post-operative Diagnosis: same  Indications: pain   Anesthesia: ethyl chloride   Procedure Details   Verbal consent was obtained for the procedure. The shoulder was prepped withalcohol and the skin was anesthetized. A 20 gauge needle was advanced into the subacromial space through posterior approach without difficulty  The space was then injected with 3 ml 1% lidocaine and 1 ml of depomedrol. The injection site was cleansed with isopropyl alcohol and a dressing was applied.  Complications:  None; patient tolerated the procedure well.   

## 2011-07-27 ENCOUNTER — Telehealth: Payer: Self-pay | Admitting: Orthopedic Surgery

## 2011-07-27 NOTE — Telephone Encounter (Signed)
No note/error/bsf

## 2011-10-07 ENCOUNTER — Other Ambulatory Visit: Payer: Self-pay | Admitting: *Deleted

## 2011-10-07 DIAGNOSIS — M25569 Pain in unspecified knee: Secondary | ICD-10-CM

## 2011-10-07 DIAGNOSIS — G5790 Unspecified mononeuropathy of unspecified lower limb: Secondary | ICD-10-CM

## 2011-10-07 MED ORDER — HYDROCODONE-ACETAMINOPHEN 5-325 MG PO TABS: 1.0000 | ORAL_TABLET | ORAL | Status: DC | PRN

## 2011-11-14 ENCOUNTER — Other Ambulatory Visit: Payer: Self-pay | Admitting: Orthopedic Surgery

## 2011-11-14 DIAGNOSIS — G5790 Unspecified mononeuropathy of unspecified lower limb: Secondary | ICD-10-CM

## 2011-11-14 DIAGNOSIS — M25569 Pain in unspecified knee: Secondary | ICD-10-CM

## 2011-11-14 MED ORDER — HYDROCODONE-ACETAMINOPHEN 5-325 MG PO TABS: 1.0000 | ORAL_TABLET | ORAL | Status: DC | PRN

## 2012-02-15 ENCOUNTER — Ambulatory Visit (INDEPENDENT_AMBULATORY_CARE_PROVIDER_SITE_OTHER): Payer: Medicare Other | Admitting: Orthopedic Surgery

## 2012-02-15 VITALS — BP 138/80 | Ht 69.0 in | Wt 180.0 lb

## 2012-02-15 DIAGNOSIS — M67919 Unspecified disorder of synovium and tendon, unspecified shoulder: Secondary | ICD-10-CM

## 2012-02-15 DIAGNOSIS — M25569 Pain in unspecified knee: Secondary | ICD-10-CM

## 2012-02-15 DIAGNOSIS — G5790 Unspecified mononeuropathy of unspecified lower limb: Secondary | ICD-10-CM

## 2012-02-15 DIAGNOSIS — M75101 Unspecified rotator cuff tear or rupture of right shoulder, not specified as traumatic: Secondary | ICD-10-CM

## 2012-02-15 MED ORDER — HYDROCODONE-ACETAMINOPHEN 5-325 MG PO TABS: 1.0000 | ORAL_TABLET | ORAL | Status: DC | PRN

## 2012-02-15 NOTE — Progress Notes (Signed)
Patient ID: Kaylee Warner, female   DOB: August 07, 1945, 68 y.o.   MRN: [id number] Chief Complaint  Patient presents with  . Follow-up    Recheck on right shoulder with injection.    Forward elevation is 150   Neer is positive  Shoulder Injection Procedure Note   Pre-operative Diagnosis: right  RC Syndrome  Post-operative Diagnosis: same  Indications: pain   Anesthesia: ethyl chloride   Procedure Details   Verbal consent was obtained for the procedure. The shoulder was prepped withalcohol and the skin was anesthetized. A 20 gauge needle was advanced into the subacromial space through posterior approach without difficulty  The space was then injected with 3 ml 1% lidocaine and 1 ml of depomedrol. The injection site was cleansed with isopropyl alcohol and a dressing was applied.  Complications:  None; patient tolerated the procedure well.

## 2012-02-15 NOTE — Patient Instructions (Addendum)
You have received a steroid shot. 15% of patients experience increased pain at the injection site with in the next 24 hours. This is best treated with ice and tylenol extra strength 2 tabs every 8 hours. If you are still having pain please call the office.  Impingement Syndrome, Rotator Cuff, Bursitis with Rehab Impingement syndrome is a condition that involves inflammation of the tendons of the rotator cuff and the subacromial bursa, that causes pain in the shoulder. The rotator cuff consists of four tendons and muscles that control much of the shoulder and upper arm function. The subacromial bursa is a fluid filled sac that helps reduce friction between the rotator cuff and one of the bones of the shoulder (acromion). Impingement syndrome is usually an overuse injury that causes swelling of the bursa (bursitis), swelling of the tendon (tendonitis), and/or a tear of the tendon (strain). Strains are classified into three categories. Grade 1 strains cause pain, but the tendon is not lengthened. Grade 2 strains include a lengthened ligament, due to the ligament being stretched or partially ruptured. With grade 2 strains there is still function, although the function may be decreased. Grade 3 strains include a complete tear of the tendon or muscle, and function is usually impaired. SYMPTOMS    Pain around the shoulder, often at the outer portion of the upper arm.   Pain that gets worse with shoulder function, especially when reaching overhead or lifting.   Sometimes, aching when not using the arm.   Pain that wakes you up at night.   Sometimes, tenderness, swelling, warmth, or redness over the affected area.   Loss of strength.   Limited motion of the shoulder, especially reaching behind the back (to the back pocket or to unhook bra) or across your body.   Crackling sound (crepitation) when moving the arm.   Biceps tendon pain and inflammation (in the front of the shoulder). Worse when bending the  elbow or lifting.  CAUSES   Impingement syndrome is often an overuse injury, in which chronic (repetitive) motions cause the tendons or bursa to become inflamed. A strain occurs when a force is paced on the tendon or muscle that is greater than it can withstand. Common mechanisms of injury include: Stress from sudden increase in duration, frequency, or intensity of training.  Direct hit (trauma) to the shoulder.   Aging, erosion of the tendon with normal use.   Bony bump on shoulder (acromial spur).  RISK INCREASES WITH:  Contact sports (football, wrestling, boxing).   Throwing sports (baseball, tennis, volleyball).   Weightlifting and bodybuilding.   Heavy labor.   Previous injury to the rotator cuff, including impingement.   Poor shoulder strength and flexibility.   Failure to warm up properly before activity.   Inadequate protective equipment.   Old age.   Bony bump on shoulder (acromial spur).  PREVENTION    Warm up and stretch properly before activity.   Allow for adequate recovery between workouts.   Maintain physical fitness:   Strength, flexibility, and endurance.   Cardiovascular fitness.   Learn and use proper exercise technique.  PROGNOSIS   If treated properly, impingement syndrome usually goes away within 6 weeks. Sometimes surgery is required.   RELATED COMPLICATIONS    Longer healing time if not properly treated, or if not given enough time to heal.   Recurring symptoms, that result in a chronic condition.   Shoulder stiffness, frozen shoulder, or loss of motion.   Rotator cuff tendon tear.  Recurring symptoms, especially if activity is resumed too soon, with overuse, with a direct blow, or when using poor technique.  TREATMENT   Treatment first involves the use of ice and medicine, to reduce pain and inflammation. The use of strengthening and stretching exercises may help reduce pain with activity. These exercises may be performed at home or  with a therapist. If non-surgical treatment is unsuccessful after more than 6 months, surgery may be advised. After surgery and rehabilitation, activity is usually possible in 3 months.   MEDICATION  If pain medicine is needed, nonsteroidal anti-inflammatory medicines (aspirin and ibuprofen), or other minor pain relievers (acetaminophen), are often advised.   Do not take pain medicine for 7 days before surgery.   Prescription pain relievers may be given, if your caregiver thinks they are needed. Use only as directed and only as much as you need.   Corticosteroid injections may be given by your caregiver. These injections should be reserved for the most serious cases, because they may only be given a certain number of times.  HEAT AND COLD  Cold treatment (icing) should be applied for 10 to 15 minutes every 2 to 3 hours for inflammation and pain, and immediately after activity that aggravates your symptoms. Use ice packs or an ice massage.   Heat treatment may be used before performing stretching and strengthening activities prescribed by your caregiver, physical therapist, or athletic trainer. Use a heat pack or a warm water soak.  SEEK MEDICAL CARE IF:    Symptoms get worse or do not improve in 4 to 6 weeks, despite treatment.   New, unexplained symptoms develop. (Drugs used in treatment may produce side effects.)  EXERCISES    Do 3 sets of 10 of each exercise RANGE OF MOTION (ROM) AND STRETCHING EXERCISES - Impingement Syndrome (Rotator Cuff  Tendinitis, Bursitis) These exercises may help you when beginning to rehabilitate your injury. Your symptoms may go away with or without further involvement from your physician, physical therapist or athletic trainer. While completing these exercises, remember:    Restoring tissue flexibility helps normal motion to return to the joints. This allows healthier, less painful movement and activity.   An effective stretch should be held for at least 30  seconds.   A stretch should never be painful. You should only feel a gentle lengthening or release in the stretched tissue.  STRETCH  Flexion, Standing  Stand with good posture. With an underhand grip on your right / left hand, and an overhand grip on the opposite hand, grasp a broomstick or cane so that your hands are a little more than shoulder width apart.   Keeping your right / left elbow straight and shoulder muscles relaxed, push the stick with your opposite hand, to raise your right / left arm in front of your body and then overhead. Raise your arm until you feel a stretch in your right / left shoulder, but before you have increased shoulder pain.   Try to avoid shrugging your right / left shoulder as your arm rises, by keeping your shoulder blade tucked down and toward your mid-back spine. Hold for __________ seconds.   Slowly return to the starting position.  Repeat __________ times. Complete this exercise __________ times per day. STRETCH  Abduction, Supine  Lie on your back. With an underhand grip on your right / left hand and an overhand grip on the opposite hand, grasp a broomstick or cane so that your hands are a little more than shoulder  width apart.   Keeping your right / left elbow straight and your shoulder muscles relaxed, push the stick with your opposite hand, to raise your right / left arm out to the side of your body and then overhead. Raise your arm until you feel a stretch in your right / left shoulder, but before you have increased shoulder pain.   Try to avoid shrugging your right / left shoulder as your arm rises, by keeping your shoulder blade tucked down and toward your mid-back spine. Hold for __________ seconds.   Slowly return to the starting position.  Repeat __________ times. Complete this exercise __________ times per day. ROM  Flexion, Active-Assisted  Lie on your back. You may bend your knees for comfort.   Grasp a broomstick or cane so your hands are  about shoulder width apart. Your right / left hand should grip the end of the stick, so that your hand is positioned "thumbs-up," as if you were about to shake hands.   Using your healthy arm to lead, raise your right / left arm overhead, until you feel a gentle stretch in your shoulder. Hold for __________ seconds.   Use the stick to assist in returning your right / left arm to its starting position.  Repeat __________ times. Complete this exercise __________ times per day.   ROM - Internal Rotation, Supine   Lie on your back on a firm surface. Place your right / left elbow about 60 degrees away from your side. Elevate your elbow with a folded towel, so that the elbow and shoulder are the same height.   Using a broomstick or cane and your strong arm, pull your right / left hand toward your body until you feel a gentle stretch, but no increase in your shoulder pain. Keep your shoulder and elbow in place throughout the exercise.   Hold for __________ seconds. Slowly return to the starting position.  Repeat __________ times. Complete this exercise __________ times per day. STRETCH - Internal Rotation  Place your right / left hand behind your back, palm up.   Throw a towel or belt over your opposite shoulder. Grasp the towel with your right / left hand.   While keeping an upright posture, gently pull up on the towel, until you feel a stretch in the front of your right / left shoulder.   Avoid shrugging your right / left shoulder as your arm rises, by keeping your shoulder blade tucked down and toward your mid-back spine.   Hold for __________ seconds. Release the stretch, by lowering your healthy hand.  Repeat __________ times. Complete this exercise __________ times per day. ROM - Internal Rotation   Using an underhand grip, grasp a stick behind your back with both hands.   While standing upright with good posture, slide the stick up your back until you feel a mild stretch in the front of  your shoulder.   Hold for __________ seconds. Slowly return to your starting position.  Repeat __________ times. Complete this exercise __________ times per day.   STRETCH  Posterior Shoulder Capsule   Stand or sit with good posture. Grasp your right / left elbow and draw it across your chest, keeping it at the same height as your shoulder.   Pull your elbow, so your upper arm comes in closer to your chest. Pull until you feel a gentle stretch in the back of your shoulder.   Hold for __________ seconds.  Repeat __________ times. Complete this exercise __________  times per day. STRENGTHENING EXERCISES - Impingement Syndrome (Rotator Cuff Tendinitis, Bursitis) These exercises may help you when beginning to rehabilitate your injury. They may resolve your symptoms with or without further involvement from your physician, physical therapist or athletic trainer. While completing these exercises, remember:  Muscles can gain both the endurance and the strength needed for everyday activities through controlled exercises.   Complete these exercises as instructed by your physician, physical therapist or athletic trainer. Increase the resistance and repetitions only as guided.   You may experience muscle soreness or fatigue, but the pain or discomfort you are trying to eliminate should never worsen during these exercises. If this pain does get worse, stop and make sure you are following the directions exactly. If the pain is still present after adjustments, discontinue the exercise until you can discuss the trouble with your clinician.   During your recovery, avoid activity or exercises which involve actions that place your injured hand or elbow above your head or behind your back or head. These positions stress the tissues which you are trying to heal.  STRENGTH - Scapular Depression and Adduction   With good posture, sit on a firm chair. Support your arms in front of you, with pillows, arm rests, or on  a table top. Have your elbows in line with the sides of your body.   Gently draw your shoulder blades down and toward your mid-back spine. Gradually increase the tension, without tensing the muscles along the top of your shoulders and the back of your neck.   Hold for __________ seconds. Slowly release the tension and relax your muscles completely before starting the next repetition.   After you have practiced this exercise, remove the arm support and complete the exercise in standing as well as sitting position.  Repeat __________ times. Complete this exercise __________ times per day.   STRENGTH - Shoulder Abductors, Isometric  With good posture, stand or sit about 4-6 inches from a wall, with your right / left side facing the wall.   Bend your right / left elbow. Gently press your right / left elbow into the wall. Increase the pressure gradually, until you are pressing as hard as you can, without shrugging your shoulder or increasing any shoulder discomfort.   Hold for __________ seconds.   Release the tension slowly. Relax your shoulder muscles completely before you begin the next repetition.  Repeat __________ times. Complete this exercise __________ times per day.   STRENGTH - External Rotators, Isometric  Keep your right / left elbow at your side and bend it 90 degrees.   Step into a door frame so that the outside of your right / left wrist can press against the door frame without your upper arm leaving your side.   Gently press your right / left wrist into the door frame, as if you were trying to swing the back of your hand away from your stomach. Gradually increase the tension, until you are pressing as hard as you can, without shrugging your shoulder or increasing any shoulder discomfort.   Hold for __________ seconds.   Release the tension slowly. Relax your shoulder muscles completely before you begin the next repetition.  Repeat __________ times. Complete this exercise  __________ times per day.   STRENGTH - Supraspinatus   Stand or sit with good posture. Grasp a __________ weight, or an exercise band or tubing, so that your hand is "thumbs-up," like you are shaking hands.   Slowly lift your right /  left arm in a "V" away from your thigh, diagonally into the space between your side and straight ahead. Lift your hand to shoulder height or as far as you can, without increasing any shoulder pain. At first, many people do not lift their hands above shoulder height.   Avoid shrugging your right / left shoulder as your arm rises, by keeping your shoulder blade tucked down and toward your mid-back spine.   Hold for __________ seconds. Control the descent of your hand, as you slowly return to your starting position.  Repeat __________ times. Complete this exercise __________ times per day.   STRENGTH - External Rotators  Secure a rubber exercise band or tubing to a fixed object (table, pole) so that it is at the same height as your right / left elbow when you are standing or sitting on a firm surface.   Stand or sit so that the secured exercise band is at your uninjured side.   Bend your right / left elbow 90 degrees. Place a folded towel or small pillow under your right / left arm, so that your elbow is a few inches away from your side.   Keeping the tension on the exercise band, pull it away from your body, as if pivoting on your elbow. Be sure to keep your body steady, so that the movement is coming only from your rotating shoulder.   Hold for __________ seconds. Release the tension in a controlled manner, as you return to the starting position.  Repeat __________ times. Complete this exercise __________ times per day.   STRENGTH - Internal Rotators   Secure a rubber exercise band or tubing to a fixed object (table, pole) so that it is at the same height as your right / left elbow when you are standing or sitting on a firm surface.   Stand or sit so that the  secured exercise band is at your right / left side.   Bend your elbow 90 degrees. Place a folded towel or small pillow under your right / left arm so that your elbow is a few inches away from your side.   Keeping the tension on the exercise band, pull it across your body, toward your stomach. Be sure to keep your body steady, so that the movement is coming only from your rotating shoulder.   Hold for __________ seconds. Release the tension in a controlled manner, as you return to the starting position.  Repeat __________ times. Complete this exercise __________ times per day.   STRENGTH - Scapular Protractors, Standing   Stand arms length away from a wall. Place your hands on the wall, keeping your elbows straight.   Begin by dropping your shoulder blades down and toward your mid-back spine.   To strengthen your protractors, keep your shoulder blades down, but slide them forward on your rib cage. It will feel as if you are lifting the back of your rib cage away from the wall. This is a subtle motion and can be challenging to complete. Ask your caregiver for further instruction, if you are not sure you are doing the exercise correctly.   Hold for __________ seconds. Slowly return to the starting position, resting the muscles completely before starting the next repetition.  Repeat __________ times. Complete this exercise __________ times per day. STRENGTH - Scapular Protractors, Supine  Lie on your back on a firm surface. Extend your right / left arm straight into the air while holding a __________ weight in your hand.  Keeping your head and back in place, lift your shoulder off the floor.   Hold for __________ seconds. Slowly return to the starting position, and allow your muscles to relax completely before starting the next repetition.  Repeat __________ times. Complete this exercise __________ times per day. STRENGTH - Scapular Protractors, Quadruped  Get onto your hands and knees, with  your shoulders directly over your hands (or as close as you can be, comfortably).   Keeping your elbows locked, lift the back of your rib cage up into your shoulder blades, so your mid-back rounds out. Keep your neck muscles relaxed.   Hold this position for __________ seconds. Slowly return to the starting position and allow your muscles to relax completely before starting the next repetition.  Repeat __________ times. Complete this exercise __________ times per day.   STRENGTH - Scapular Retractors  Secure a rubber exercise band or tubing to a fixed object (table, pole), so that it is at the height of your shoulders when you are either standing, or sitting on a firm armless chair.   With a palm down grip, grasp an end of the band in each hand. Straighten your elbows and lift your hands straight in front of you, at shoulder height. Step back, away from the secured end of the band, until it becomes tense.   Squeezing your shoulder blades together, draw your elbows back toward your sides, as you bend them. Keep your upper arms lifted away from your body throughout the exercise.   Hold for __________ seconds. Slowly ease the tension on the band, as you reverse the directions and return to the starting position.  Repeat __________ times. Complete this exercise __________ times per day. STRENGTH - Shoulder Extensors   Secure a rubber exercise band or tubing to a fixed object (table, pole) so that it is at the height of your shoulders when you are either standing, or sitting on a firm armless chair.   With a thumbs-up grip, grasp an end of the band in each hand. Straighten your elbows and lift your hands straight in front of you, at shoulder height. Step back, away from the secured end of the band, until it becomes tense.   Squeezing your shoulder blades together, pull your hands down to the sides of your thighs. Do not allow your hands to go behind you.   Hold for __________ seconds. Slowly ease  the tension on the band, as you reverse the directions and return to the starting position.  Repeat __________ times. Complete this exercise __________ times per day.   STRENGTH - Scapular Retractors and External Rotators   Secure a rubber exercise band or tubing to a fixed object (table, pole) so that it is at the height as your shoulders, when you are either standing, or sitting on a firm armless chair.   With a palm down grip, grasp an end of the band in each hand. Bend your elbows 90 degrees and lift your elbows to shoulder height, at your sides. Step back, away from the secured end of the band, until it becomes tense.   Squeezing your shoulder blades together, rotate your shoulders so that your upper arms and elbows remain stationary, but your fists travel upward to head height.   Hold for __________ seconds. Slowly ease the tension on the band, as you reverse the directions and return to the starting position.  Repeat __________ times. Complete this exercise __________ times per day.   STRENGTH - Scapular Retractors and  External Rotators, Rowing   Secure a rubber exercise band or tubing to a fixed object (table, pole) so that it is at the height of your shoulders, when you are either standing, or sitting on a firm armless chair.   With a palm down grip, grasp an end of the band in each hand. Straighten your elbows and lift your hands straight in front of you, at shoulder height. Step back, away from the secured end of the band, until it becomes tense.   Step 1: Squeeze your shoulder blades together. Bending your elbows, draw your hands to your chest, as if you are rowing a boat. At the end of this motion, your hands and elbow should be at shoulder height and your elbows should be out to your sides.   Step 2: Rotate your shoulders, to raise your hands above your head. Your forearms should be vertical and your upper arms should be horizontal.   Hold for __________ seconds. Slowly ease the  tension on the band, as you reverse the directions and return to the starting position.  Repeat __________ times. Complete this exercise __________ times per day.   STRENGTH  Scapular Depressors  Find a sturdy chair without wheels, such as a dining room chair.   Keeping your feet on the floor, and your hands on the chair arms, lift your bottom up from the seat, and lock your elbows.   Keeping your elbows straight, allow gravity to pull your body weight down. Your shoulders will rise toward your ears.   Raise your body against gravity by drawing your shoulder blades down your back, shortening the distance between your shoulders and ears. Although your feet should always maintain contact with the floor, your feet should progressively support less body weight, as you get stronger.   Hold for __________ seconds. In a controlled and slow manner, lower your body weight to begin the next repetition.  Repeat __________ times. Complete this exercise __________ times per day.   Document Released: 12/27/2004 Document Revised: 03/21/2011 Document Reviewed: 04/10/2008 [facility] Patient Information 2013 [place], [place].

## 2012-05-04 ENCOUNTER — Other Ambulatory Visit: Payer: Self-pay | Admitting: Podiatrist

## 2012-05-04 DIAGNOSIS — M659 Synovitis and tenosynovitis, unspecified: Secondary | ICD-10-CM

## 2012-05-04 DIAGNOSIS — M7750 Other enthesopathy of unspecified foot: Secondary | ICD-10-CM

## 2012-05-09 ENCOUNTER — Ambulatory Visit
Admission: RE | Admit: 2012-05-09 | Discharge: 2012-05-09 | Disposition: A | Discharge status: Home / Self Care | Payer: Medicare Other | Source: Ambulatory Visit | Attending: Podiatrist | Admitting: Podiatrist

## 2012-05-09 DIAGNOSIS — M7750 Other enthesopathy of unspecified foot: Secondary | ICD-10-CM

## 2012-05-09 DIAGNOSIS — M659 Synovitis and tenosynovitis, unspecified: Secondary | ICD-10-CM

## 2012-05-14 ENCOUNTER — Other Ambulatory Visit: Payer: Self-pay | Admitting: *Deleted

## 2012-05-14 DIAGNOSIS — M75101 Unspecified rotator cuff tear or rupture of right shoulder, not specified as traumatic: Secondary | ICD-10-CM

## 2012-05-14 MED ORDER — IBUPROFEN 800 MG PO TABS: 800.0000 mg | ORAL_TABLET | Freq: Three times a day (TID) | ORAL | Status: DC | PRN

## 2012-10-04 ENCOUNTER — Ambulatory Visit (INDEPENDENT_AMBULATORY_CARE_PROVIDER_SITE_OTHER): Payer: Medicare Other

## 2012-10-04 ENCOUNTER — Ambulatory Visit (INDEPENDENT_AMBULATORY_CARE_PROVIDER_SITE_OTHER): Payer: Medicare Other | Admitting: Orthopedic Surgery

## 2012-10-04 VITALS — BP 134/83 | Ht 69.0 in | Wt 180.0 lb

## 2012-10-04 DIAGNOSIS — M25561 Pain in right knee: Secondary | ICD-10-CM

## 2012-10-04 DIAGNOSIS — S8010XA Contusion of unspecified lower leg, initial encounter: Secondary | ICD-10-CM | POA: Insufficient documentation

## 2012-10-04 DIAGNOSIS — S8011XA Contusion of right lower leg, initial encounter: Secondary | ICD-10-CM

## 2012-10-04 DIAGNOSIS — G894 Chronic pain syndrome: Secondary | ICD-10-CM

## 2012-10-04 DIAGNOSIS — M25569 Pain in unspecified knee: Secondary | ICD-10-CM

## 2012-10-04 MED ORDER — HYDROCODONE-ACETAMINOPHEN 10-325 MG PO TABS: 1.0000 | ORAL_TABLET | ORAL | Status: DC | PRN
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
## 2012-10-04 NOTE — Patient Instructions (Signed)
Heat  Support hose

## 2012-10-18 ENCOUNTER — Ambulatory Visit: Payer: Medicare Other | Admitting: Orthopedic Surgery

## 2013-04-04 ENCOUNTER — Ambulatory Visit: Payer: Medicare Other | Admitting: Orthopedic Surgery

## 2013-05-09 ENCOUNTER — Encounter: Payer: Self-pay | Admitting: Orthopedic Surgery

## 2013-05-09 ENCOUNTER — Ambulatory Visit (INDEPENDENT_AMBULATORY_CARE_PROVIDER_SITE_OTHER): Payer: Medicare Other | Admitting: Orthopedic Surgery

## 2013-05-09 VITALS — BP 139/92 | Ht 69.0 in | Wt 180.0 lb

## 2013-05-09 DIAGNOSIS — M76829 Posterior tibial tendinitis, unspecified leg: Secondary | ICD-10-CM

## 2013-05-09 DIAGNOSIS — IMO0002 Reserved for concepts with insufficient information to code with codable children: Secondary | ICD-10-CM

## 2013-05-09 DIAGNOSIS — R609 Edema, unspecified: Secondary | ICD-10-CM

## 2013-05-09 DIAGNOSIS — M6789 Other specified disorders of synovium and tendon, multiple sites: Secondary | ICD-10-CM

## 2013-05-09 DIAGNOSIS — M1710 Unilateral primary osteoarthritis, unspecified knee: Secondary | ICD-10-CM | POA: Insufficient documentation

## 2013-05-09 DIAGNOSIS — M179 Osteoarthritis of knee, unspecified: Secondary | ICD-10-CM

## 2013-05-09 DIAGNOSIS — M1711 Unilateral primary osteoarthritis, right knee: Secondary | ICD-10-CM | POA: Insufficient documentation

## 2013-05-09 MED ORDER — MELOXICAM 7.5 MG PO TABS: 7.5000 mg | ORAL_TABLET | Freq: Every day | ORAL | Status: DC

## 2013-05-09 MED ORDER — HYDROCODONE-ACETAMINOPHEN 5-325 MG PO TABS: 1.0000 | ORAL_TABLET | Freq: Four times a day (QID) | ORAL | Status: DC | PRN

## 2013-05-09 NOTE — Progress Notes (Signed)
Patient ID: Kaylee Warner, female   DOB: 09-03-1945, 68 y.o.   MRN: [id number]  Chief Complaint  Patient presents with  . Knee Pain    Right knee pain, no injury   Along with pain over the medial ankle with swelling and distal and leg edema with swelling  Previous right knee arthroscopy complains of leg pain, "does take the whole leg off"  She's basically having peripheral edema with discoloration of both legs with intermittent swelling depending on how much she stands during the day. She also has pain over the medial malleolar area and foot with chronic pes planus bunion deformity and second digit hammertoe right foot  Constitutional symptoms no fever or chills neurologic symptoms no numbness or tingling she is having some radiating pain from the right hip into the right knee and right leg based on comments pain she's having in her knee  No past medical history on file. Vital signs:  BP 139/92  Ht 5\' 9"  (1.753 m)  Wt 180 lb (81.647 kg)  BMI 26.57 kg/m2  General the patient is well-developed and well-nourished grooming and hygiene are normal Oriented x3 Mood and affect normal Ambulation normal  Inspection of the right foot swelling over the medial malleolar area and posterior tibial tendon tenderness and flatfoot deformity bunion deformity and hammertoe deformity  Knee is in valgus exacerbated by standing. Gait pattern remains normal except for the valgus. Knee flexion ARC is 125, joint is stable. McMurray sign is negative. Motor exam is 5 muscle testing skin is clean dry and intact with discoloration bilaterally Cardiovascular bilateral discoloration of peripheral edema mild  Sensory exam normal  Encounter Diagnoses  Name Primary?  . OA (osteoarthritis) of knee Yes  . PTTD (posterior tibial tendon dysfunction)   . Edema    Meds ordered this encounter  Medications  . meloxicam (MOBIC) 7.5 MG tablet    Sig: Take 1 tablet (7.5 mg total) by mouth daily.    Dispense:  60 tablet     Refill:  5  . HYDROcodone-acetaminophen (NORCO) 5-325 MG per tablet    Sig: Take 1 tablet by mouth every 6 (six) hours as needed for moderate pain.    Dispense:  30 tablet    Refill:  0    Recommend injection right knee Foot orthotic TED hose  3 month followup  Knee  Injection Procedure Note  Pre-operative Diagnosis: right knee oa  Post-operative Diagnosis: same  Indications: pain  Anesthesia: ethyl chloride   Procedure Details   Verbal consent was obtained for the procedure. Time out was completed.The joint was prepped with alcohol, followed by  Ethyl chloride spray and A 20 gauge needle was inserted into the knee via lateral approach; 87ml 1% lidocaine and 1 ml of depomedrol  was then injected into the joint . The needle was removed and the area cleansed and dressed.  Complications:  None; patient tolerated the procedure well.

## 2013-05-09 NOTE — Patient Instructions (Signed)
You have received a steroid shot. 15% of patients experience increased pain at the injection site with in the next 24 hours. This is best treated with ice and tylenol extra strength 2 tabs every 8 hours. If you are still having pain please call the office.    

## 2013-05-16 ENCOUNTER — Ambulatory Visit: Payer: Medicare Other | Admitting: Orthopedic Surgery

## 2013-08-05 ENCOUNTER — Telehealth: Payer: Self-pay | Admitting: Orthopedic Surgery

## 2013-08-05 NOTE — Telephone Encounter (Signed)
Patient called to relay that she needs to cancel her upcoming appointment for 08/08/13, due to other medical issues; states having to have an MRI regarding nerve pain, then to see a neurosurgeon. States will call back to re-schedule.

## 2013-08-08 ENCOUNTER — Ambulatory Visit: Payer: Medicare Other | Admitting: Orthopedic Surgery

## 2014-01-04 ENCOUNTER — Emergency Department (HOSPITAL_COMMUNITY)
Admission: EM | Admit: 2014-01-04 | Discharge: 2014-01-04 | Disposition: A | Discharge status: Home / Self Care | Payer: Medicare Other | Attending: Emergency Medicine | Admitting: Emergency Medicine

## 2014-01-04 ENCOUNTER — Encounter (HOSPITAL_COMMUNITY): Payer: Self-pay | Admitting: Emergency Medicine

## 2014-01-04 ENCOUNTER — Emergency Department (HOSPITAL_COMMUNITY): Payer: Medicare Other

## 2014-01-04 DIAGNOSIS — Z791 Long term (current) use of non-steroidal anti-inflammatories (NSAID): Secondary | ICD-10-CM | POA: Insufficient documentation

## 2014-01-04 DIAGNOSIS — I10 Essential (primary) hypertension: Secondary | ICD-10-CM | POA: Diagnosis present

## 2014-01-04 DIAGNOSIS — R51 Headache: Secondary | ICD-10-CM | POA: Diagnosis not present

## 2014-01-04 DIAGNOSIS — Z79899 Other long term (current) drug therapy: Secondary | ICD-10-CM | POA: Diagnosis not present

## 2014-01-04 DIAGNOSIS — R519 Headache, unspecified: Secondary | ICD-10-CM

## 2014-01-04 HISTORY — DX: Essential (primary) hypertension: I10

## 2014-01-04 LAB — COMPREHENSIVE METABOLIC PANEL
ALT: 14 U/L (ref 0–35)
AST: 13 U/L (ref 0–37)
Albumin: 3.8 g/dL (ref 3.5–5.2)
Alkaline Phosphatase: 60 U/L (ref 39–117)
Anion gap: 5 (ref 5–15)
BUN: 13 mg/dL (ref 6–23)
CO2: 25 mmol/L (ref 19–32)
Calcium: 9 mg/dL (ref 8.4–10.5)
Chloride: 110 mEq/L (ref 96–112)
Creatinine, Ser: 0.52 mg/dL (ref 0.50–1.10)
GFR calc Af Amer: >90 mL/min (ref >90)
GFR calc non Af Amer: >90 mL/min (ref >90)
Glucose, Bld: 105 mg/dL — ABNORMAL HIGH (ref 70–99)
Potassium: 3.3 mmol/L — ABNORMAL LOW (ref 3.5–5.1)
Sodium: 140 mmol/L (ref 135–145)
Total Bilirubin: 0.6 mg/dL (ref 0.3–1.2)
Total Protein: 6.5 g/dL (ref 6.0–8.3)

## 2014-01-04 LAB — CBC WITH DIFFERENTIAL/PLATELET
Basophils Absolute: 0 10*3/uL (ref 0.0–0.1)
Basophils Relative: 0 % (ref 0–1)
Eosinophils Absolute: 0.2 10*3/uL (ref 0.0–0.7)
Eosinophils Relative: 2 % (ref 0–5)
HCT: 35.8 % — ABNORMAL LOW (ref 36.0–46.0)
Hemoglobin: 11.3 g/dL — ABNORMAL LOW (ref 12.0–15.0)
Lymphocytes Relative: 26 % (ref 12–46)
Lymphs Abs: 2.1 10*3/uL (ref 0.7–4.0)
MCH: 23.4 pg — ABNORMAL LOW (ref 26.0–34.0)
MCHC: 31.6 g/dL (ref 30.0–36.0)
MCV: 74.1 fL — ABNORMAL LOW (ref 78.0–100.0)
Monocytes Absolute: 0.5 10*3/uL (ref 0.1–1.0)
Monocytes Relative: 6 % (ref 3–12)
Neutro Abs: 5.3 10*3/uL (ref 1.7–7.7)
Neutrophils Relative %: 66 % (ref 43–77)
Platelets: 287 10*3/uL (ref 150–400)
RBC: 4.83 MIL/uL (ref 3.87–5.11)
RDW: 18.6 % — ABNORMAL HIGH (ref 11.5–15.5)
WBC: 8.1 10*3/uL (ref 4.0–10.5)

## 2014-01-04 MED ORDER — HYDRALAZINE HCL 20 MG/ML IJ SOLN
5.0000 mg | Freq: Once | INTRAMUSCULAR | Status: AC
  Administered 2014-01-04: 5 mg via INTRAVENOUS
  Filled 2014-01-04: qty 1

## 2014-01-04 MED ORDER — HYDROCODONE-ACETAMINOPHEN 5-325 MG PO TABS: 1.0000 | ORAL_TABLET | Freq: Four times a day (QID) | ORAL | Status: DC | PRN

## 2014-01-04 MED ORDER — LORAZEPAM 2 MG/ML IJ SOLN
0.5000 mg | Freq: Once | INTRAMUSCULAR | Status: AC
  Administered 2014-01-04: 0.5 mg via INTRAVENOUS
  Filled 2014-01-04: qty 1

## 2014-01-04 MED ORDER — HYDROMORPHONE HCL 1 MG/ML IJ SOLN
1.0000 mg | Freq: Once | INTRAMUSCULAR | Status: AC
  Administered 2014-01-04: 1 mg via INTRAVENOUS
  Filled 2014-01-04: qty 1

## 2014-01-04 NOTE — Discharge Instructions (Signed)
Follow up with your md next week to check bp

## 2014-01-04 NOTE — ED Notes (Signed)
Pt alert & oriented x4, stable gait. Patient given discharge instructions, paperwork & prescription(s). Patient verbalized understanding. Pt left department by wheelchair w/ no further questions. 

## 2014-01-04 NOTE — ED Notes (Signed)
Pt reports her b/p was 210/115 which she checked it at home a little while ago. Pt reports headaches at this time
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
## 2014-04-05 ENCOUNTER — Encounter (HOSPITAL_COMMUNITY): Payer: Self-pay | Admitting: Emergency Medicine

## 2014-04-05 ENCOUNTER — Emergency Department (HOSPITAL_COMMUNITY)
Admission: EM | Admit: 2014-04-05 | Discharge: 2014-04-05 | Disposition: A | Discharge status: Home / Self Care | Payer: Medicare Other | Attending: Emergency Medicine | Admitting: Emergency Medicine

## 2014-04-05 DIAGNOSIS — M545 Low back pain, unspecified: Secondary | ICD-10-CM

## 2014-04-05 DIAGNOSIS — I10 Essential (primary) hypertension: Secondary | ICD-10-CM | POA: Diagnosis not present

## 2014-04-05 DIAGNOSIS — Z79899 Other long term (current) drug therapy: Secondary | ICD-10-CM | POA: Insufficient documentation

## 2014-04-05 DIAGNOSIS — Z791 Long term (current) use of non-steroidal anti-inflammatories (NSAID): Secondary | ICD-10-CM | POA: Insufficient documentation

## 2014-04-05 DIAGNOSIS — M1990 Unspecified osteoarthritis, unspecified site: Secondary | ICD-10-CM | POA: Diagnosis not present

## 2014-04-05 HISTORY — DX: Unspecified osteoarthritis, unspecified site: M19.90

## 2014-04-05 MED ORDER — HYDROMORPHONE HCL 1 MG/ML IJ SOLN
1.0000 mg | Freq: Once | INTRAMUSCULAR | Status: AC
  Administered 2014-04-05: 1 mg via INTRAMUSCULAR
  Filled 2014-04-05: qty 1

## 2014-04-05 MED ORDER — ONDANSETRON 8 MG PO TBDP
8.0000 mg | ORAL_TABLET | Freq: Once | ORAL | Status: AC
  Administered 2014-04-05: 8 mg via ORAL
  Filled 2014-04-05: qty 1

## 2014-04-05 MED ORDER — HYDROMORPHONE HCL 4 MG PO TABS: 4.0000 mg | ORAL_TABLET | Freq: Four times a day (QID) | ORAL | Status: DC | PRN

## 2014-04-05 NOTE — Discharge Instructions (Signed)
Follow up with your md this week.  Take the pain medicine if the ibuprofen does not help

## 2014-04-05 NOTE — ED Notes (Signed)
Pt seen by Neurosurgeon for epidural injections on Wed. Pt states she is having low back pain with radiation into her groin and down her legs.

## 2014-04-05 NOTE — ED Provider Notes (Signed)
CSN: [id number]     Arrival date & time 04/05/14  1744 History   First MD Initiated Contact with Patient 04/05/14 1800     Chief Complaint  Patient presents with  . Back Pain     (Consider location/radiation/quality/duration/timing/severity/associated sxs/prior Treatment) Patient is a 69 y.o. female presenting with back pain. The history is provided by the patient (the pt complains of lower back pain.  she got an injection in her back by neurosurgery, but it has not helped).  Back Pain Location:  Lumbar spine Quality:  Aching Radiates to:  R posterior upper leg Pain severity:  Moderate Pain is:  Same all the time Onset quality:  Gradual Timing:  Constant Progression:  Worsening Associated symptoms: no abdominal pain, no chest pain and no headaches     Past Medical History  Diagnosis Date  . Hypertension   . Arthritis    Past Surgical History  Procedure Laterality Date  . Sark 2009      Dr. Jolee Ewing    Family History  Problem Relation Age of Onset  . Diabetes      family history   . Arthritis      family history    History  Substance Use Topics  . Smoking status: Never Smoker   . Smokeless tobacco: Never Used  . Alcohol Use: No   OB History    Gravida Para Term Preterm AB TAB SAB Ectopic Multiple Living   4 4 4             Review of Systems  Constitutional: Negative for appetite change and fatigue.  HENT: Negative for congestion, ear discharge and sinus pressure.   Eyes: Negative for discharge.  Respiratory: Negative for cough.   Cardiovascular: Negative for chest pain.  Gastrointestinal: Negative for abdominal pain and diarrhea.  Genitourinary: Negative for frequency and hematuria.  Musculoskeletal: Positive for back pain.  Skin: Negative for rash.  Neurological: Negative for seizures and headaches.  Psychiatric/Behavioral: Negative for hallucinations.      Allergies  Celecoxib and Codeine  Home Medications   Prior to Admission medications    Medication Sig Start Date End Date Taking? Authorizing Provider  amLODipine (NORVASC) 10 MG tablet  01/24/11   Historical Provider, MD  amLODipine-benazepril (LOTREL) 5-20 MG per capsule Take 1 capsule by mouth daily.      Historical Provider, MD  DULoxetine (CYMBALTA) 30 MG capsule Take 30 mg by mouth daily.    Historical Provider, MD  DULoxetine (CYMBALTA) 60 MG capsule Take 60 mg by mouth daily. 12/02/13   Historical Provider, MD  gabapentin (NEURONTIN) 100 MG capsule Take 1 capsule (100 mg total) by mouth 3 (three) times daily. 03/02/11 03/01/12  Carole Civil, MD  gabapentin (NEURONTIN) 100 MG tablet Take 100 mg by mouth 3 (three) times daily.      Historical Provider, MD  HYDROcodone-acetaminophen (NORCO/VICODIN) 5-325 MG per tablet Take 1 tablet by mouth every 6 (six) hours as needed for moderate pain. 01/04/14   Milton Ferguson, MD  HYDROmorphone (DILAUDID) 4 MG tablet Take 1 tablet (4 mg total) by mouth every 6 (six) hours as needed for severe pain. 04/05/14   Milton Ferguson, MD  ibuprofen (ADVIL,MOTRIN) 800 MG tablet Take 1 tablet (800 mg total) by mouth every 8 (eight) hours as needed. 05/14/12   Carole Civil, MD  lisinopril (PRINIVIL,ZESTRIL) 40 MG tablet  01/24/11   Historical Provider, MD  meloxicam (MOBIC) 7.5 MG tablet Take 1 tablet (7.5 mg total) by mouth daily.  05/09/13   Carole Civil, MD  methylPREDNIsolone (MEDROL DOSPACK) 4 MG tablet  02/09/12   Historical Provider, MD  nabumetone (RELAFEN) 750 MG tablet Take 1 tablet (750 mg total) by mouth 2 (two) times daily. 06/22/10   Carole Civil, MD  Sertraline HCl (ZOLOFT PO) Take by mouth.      Historical Provider, MD   BP 177/103 mmHg  Pulse 79  Temp(Src) 98.1 F (36.7 C) (Oral)  Resp 20  Ht 5\' 9"  (1.753 m)  Wt 170 lb (77.111 kg)  BMI 25.09 kg/m2  SpO2 100% Physical Exam  Constitutional: She is oriented to person, place, and time. She appears well-developed.  HENT:  Head: Normocephalic.  Eyes: Conjunctivae and  EOM are normal. No scleral icterus.  Neck: Neck supple. No thyromegaly present.  Cardiovascular: Normal rate and regular rhythm.  Exam reveals no gallop and no friction rub.   No murmur heard. Pulmonary/Chest: No stridor. She has no wheezes. She has no rales. She exhibits no tenderness.  Abdominal: She exhibits no distension. There is no tenderness. There is no rebound.  Musculoskeletal: Normal range of motion. She exhibits no edema.  Tender lower lumbar spine  Lymphadenopathy:    She has no cervical adenopathy.  Neurological: She is oriented to person, place, and time. She displays normal reflexes. She exhibits normal muscle tone. Coordination normal.  Post straight leg raise on the right.  Normal strength in all extr   Skin: No rash noted. No erythema.  Psychiatric: She has a normal mood and affect. Her behavior is normal.    ED Course  Procedures (including critical care time) Labs Review Labs Reviewed - No data to display  Imaging Review No results found.   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

## 2014-04-05 NOTE — ED Notes (Signed)
Patient vomited when sitting up.  Provided 8mg  Zofran ODT and ice chips. Waited until nausea subsided.  Left unit with steady gait.  Skin warm and dry.

## 2014-04-09 ENCOUNTER — Emergency Department (HOSPITAL_COMMUNITY)
Admission: EM | Admit: 2014-04-09 | Discharge: 2014-04-09 | Disposition: A | Discharge status: Home / Self Care | Payer: Medicare Other | Attending: Emergency Medicine | Admitting: Emergency Medicine

## 2014-04-09 ENCOUNTER — Encounter (HOSPITAL_COMMUNITY): Payer: Self-pay | Admitting: Emergency Medicine

## 2014-04-09 DIAGNOSIS — M1990 Unspecified osteoarthritis, unspecified site: Secondary | ICD-10-CM | POA: Insufficient documentation

## 2014-04-09 DIAGNOSIS — I10 Essential (primary) hypertension: Secondary | ICD-10-CM | POA: Insufficient documentation

## 2014-04-09 DIAGNOSIS — Z791 Long term (current) use of non-steroidal anti-inflammatories (NSAID): Secondary | ICD-10-CM | POA: Insufficient documentation

## 2014-04-09 DIAGNOSIS — M79604 Pain in right leg: Secondary | ICD-10-CM | POA: Diagnosis not present

## 2014-04-09 DIAGNOSIS — Z79899 Other long term (current) drug therapy: Secondary | ICD-10-CM | POA: Diagnosis not present

## 2014-04-09 DIAGNOSIS — R102 Pelvic and perineal pain: Secondary | ICD-10-CM | POA: Insufficient documentation

## 2014-04-09 DIAGNOSIS — K6289 Other specified diseases of anus and rectum: Secondary | ICD-10-CM | POA: Diagnosis not present

## 2014-04-09 LAB — URINALYSIS, ROUTINE W REFLEX MICROSCOPIC
Bilirubin Urine: NEGATIVE
Glucose, UA: NEGATIVE mg/dL
Hgb urine dipstick: NEGATIVE
Ketones, ur: NEGATIVE mg/dL
Leukocytes, UA: NEGATIVE
Nitrite: NEGATIVE
Protein, ur: NEGATIVE mg/dL
Specific Gravity, Urine: 1.026 (ref 1.005–1.030)
Urobilinogen, UA: 0.2 mg/dL (ref 0.0–1.0)
pH: 6 (ref 5.0–8.0)

## 2014-04-09 LAB — WET PREP, GENITAL
Clue Cells Wet Prep HPF POC: NONE SEEN
Trich, Wet Prep: NONE SEEN
Yeast Wet Prep HPF POC: NONE SEEN

## 2014-04-09 MED ORDER — IBUPROFEN 200 MG PO TABS
600.0000 mg | ORAL_TABLET | Freq: Once | ORAL | Status: AC
  Administered 2014-04-09: 600 mg via ORAL
  Filled 2014-04-09: qty 3

## 2014-04-09 MED ORDER — HYDROCODONE-ACETAMINOPHEN 5-325 MG PO TABS: 0.5000 | ORAL_TABLET | ORAL | Status: DC | PRN

## 2014-04-09 NOTE — ED Notes (Signed)
Patient verbalizes understanding of discharge instructions, prescription medications, home care and follow up care. Patient out of department at this time with family. 

## 2014-04-09 NOTE — ED Notes (Addendum)
Pt c/o rectum and pelvic pain that will radiate down her right leg. Pt states that she has been having problems over the past year but got worse in the past 2 weeks.  Pt had injection in her lower back past week and cant get back into see him until April 14th.  The doctor's office told her the pain isnt related to the injections. Pain is worse with movement

## 2014-04-09 NOTE — ED Provider Notes (Signed)
CSN: [id number]     Arrival date & time 04/09/14  1804 History   First MD Initiated Contact with Patient 04/09/14 1920     Chief Complaint  Patient presents with  . Rectal Pain  . Pelvic Pain  . Leg Pain     (Consider location/radiation/quality/duration/timing/severity/associated sxs/prior Treatment) Patient is a 69 y.o. female presenting with female genitourinary complaint.  Female GU Problem This is a new problem. Episode onset: 2 weeks ago. The problem occurs constantly. The problem has been gradually worsening. Pertinent negatives include no chest pain and no abdominal pain. Associated symptoms comments: No fevers, no urinary symptoms, no vaginal bleeding or discharge. Nothing aggravates the symptoms. Relieved by: Standing, walking, having bowel movements. Treatments tried: Ibuprofen. The treatment provided mild relief.    Past Medical History  Diagnosis Date  . Hypertension   . Arthritis    Past Surgical History  Procedure Laterality Date  . Sark 2009      Dr. Jolee Ewing    Family History  Problem Relation Age of Onset  . Diabetes      family history   . Arthritis      family history    History  Substance Use Topics  . Smoking status: Never Smoker   . Smokeless tobacco: Never Used  . Alcohol Use: No   OB History    Gravida Para Term Preterm AB TAB SAB Ectopic Multiple Living   4 4 4             Review of Systems  Cardiovascular: Negative for chest pain.  Gastrointestinal: Negative for abdominal pain.  All other systems reviewed and are negative.     Allergies  Celecoxib and Codeine  Home Medications   Prior to Admission medications   Medication Sig Start Date End Date Taking? Authorizing Provider  azithromycin (ZITHROMAX) 250 MG tablet Take 250-500 mg by mouth daily. Take 2 tablets (500 mg) on Day 1 then Take 1 tablet (250 mg) Daily for 4 Days.   Yes Historical Provider, MD  DICLOFENAC PO Take 10 mg by mouth daily.   Yes Historical Provider, MD   Diphenhydramine-APAP, sleep, (GOODYS PM PO) Take 1 packet by mouth daily as needed (headache).   Yes Historical Provider, MD  DULoxetine (CYMBALTA) 60 MG capsule Take 60 mg by mouth daily. 12/02/13  Yes Historical Provider, MD  ibuprofen (ADVIL,MOTRIN) 800 MG tablet Take 1 tablet (800 mg total) by mouth every 8 (eight) hours as needed. 05/14/12  Yes Carole Civil, MD  lisinopril (PRINIVIL,ZESTRIL) 40 MG tablet Take 40 mg by mouth daily.  01/24/11  Yes Historical Provider, MD  meloxicam (MOBIC) 7.5 MG tablet Take 1 tablet (7.5 mg total) by mouth daily. 05/09/13  Yes Carole Civil, MD  gabapentin (NEURONTIN) 100 MG capsule Take 1 capsule (100 mg total) by mouth 3 (three) times daily. 03/02/11 03/01/12  Carole Civil, MD  HYDROcodone-acetaminophen (NORCO/VICODIN) 5-325 MG per tablet Take 1 tablet by mouth every 6 (six) hours as needed for moderate pain. Patient not taking: Reported on 04/09/2014 01/04/14   Milton Ferguson, MD  HYDROmorphone (DILAUDID) 4 MG tablet Take 1 tablet (4 mg total) by mouth every 6 (six) hours as needed for severe pain. 04/05/14   Milton Ferguson, MD  nabumetone (RELAFEN) 750 MG tablet Take 1 tablet (750 mg total) by mouth 2 (two) times daily. Patient not taking: Reported on 04/09/2014 06/22/10   Carole Civil, MD   BP 164/100 mmHg  Pulse 79  Temp(Src) 98 F (  36.7 C) (Oral)  Resp 18  SpO2 94% Physical Exam  Constitutional: She is oriented to person, place, and time. She appears well-developed and well-nourished. No distress.  HENT:  Head: Normocephalic and atraumatic.  Mouth/Throat: Oropharynx is clear and moist.  Eyes: Conjunctivae are normal. Pupils are equal, round, and reactive to light. No scleral icterus.  Neck: Neck supple.  Cardiovascular: Normal rate, regular rhythm, normal heart sounds and intact distal pulses.   No murmur heard. Pulmonary/Chest: Effort normal and breath sounds normal. No stridor. No respiratory distress. She has no rales.   Abdominal: Soft. Bowel sounds are normal. She exhibits no distension. There is no tenderness.  Genitourinary: There is no rash or tenderness on the right labia. There is no rash or tenderness on the left labia. Uterus is tender. Cervix exhibits no motion tenderness. Right adnexum displays no mass and no tenderness. Left adnexum displays no mass and no tenderness. There is tenderness in the vagina. No bleeding in the vagina. No vaginal discharge found.  No visible masses  Musculoskeletal: Normal range of motion.  Neurological: She is alert and oriented to person, place, and time.  Skin: Skin is warm and dry. No rash noted.  Psychiatric: She has a normal mood and affect. Her behavior is normal.  Nursing note and vitals reviewed.   ED Course  Procedures (including critical care time) Labs Review Labs Reviewed  WET PREP, GENITAL - Abnormal; Notable for the following:    WBC, Wet Prep HPF POC MODERATE (*)    All other components within normal limits  URINALYSIS, ROUTINE W REFLEX MICROSCOPIC    Imaging Review No results found.   EKG Interpretation None      MDM   Final diagnoses:  Pelvic pain in female    69 year old female with history of multiple vaginal deliveries who presents with pelvic pain. She states been ongoing for the past 2 weeks, but on further history, she reports some discomfort for at least the past year. She thought it was related to her back pain which has been treated with spinal injections by her orthopedist. Well-appearing on exam, abdomen soft with mild pelvic tenderness. Pelvic exam is unremarkable. UA reassuring. History not consistent with appendicitis, bowel traction, colitis, or other emergent abdominal pathology. I suspect her pain is likely gynecologic. Advised, in follow-up and she has an appointment already in about a week. She was given return precautions.    Serita Grit, MD 04/10/14 [phone]

## 2014-04-09 NOTE — Discharge Instructions (Signed)
Pelvic Pain Female pelvic pain can be caused by many different things and start from a variety of places. Pelvic pain refers to pain that is located in the lower half of the abdomen and between your hips. The pain may occur over a short period of time (acute) or may be reoccurring (chronic). The cause of pelvic pain may be related to disorders affecting the female reproductive organs (gynecologic), but it may also be related to the bladder, kidney stones, an intestinal complication, or muscle or skeletal problems. Getting help right away for pelvic pain is important, especially if there has been severe, sharp, or a sudden onset of unusual pain. It is also important to get help right away because some types of pelvic pain can be life threatening.  CAUSES  Below are only some of the causes of pelvic pain. The causes of pelvic pain can be in one of several categories.   Gynecologic.  Pelvic inflammatory disease.  Sexually transmitted infection.  Ovarian cyst or a twisted ovarian ligament (ovarian torsion).  Uterine lining that grows outside the uterus (endometriosis).  Fibroids, cysts, or tumors.  Ovulation.  Pregnancy.  Pregnancy that occurs outside the uterus (ectopic pregnancy).  Miscarriage.  Labor.  Abruption of the placenta or ruptured uterus.  Infection.  Uterine infection (endometritis).  Bladder infection.  Diverticulitis.  Miscarriage related to a uterine infection (septic abortion).  Bladder.  Inflammation of the bladder (cystitis).  Kidney stone(s).  Gastrointestinal.  Constipation.  Diverticulitis.  Neurologic.  Trauma.  Feeling pelvic pain because of mental or emotional causes (psychosomatic).  Cancers of the bowel or pelvis. EVALUATION  Your caregiver will want to take a careful history of your concerns. This includes recent changes in your health, a careful gynecologic history of your periods (menses), and a sexual history. Obtaining your family  history and medical history is also important. Your caregiver may suggest a pelvic exam. A pelvic exam will help identify the location and severity of the pain. It also helps in the evaluation of which organ system may be involved. In order to identify the cause of the pelvic pain and be properly treated, your caregiver may order tests. These tests may include:   A pregnancy test.  Pelvic ultrasonography.  An X-ray exam of the abdomen.  A urinalysis or evaluation of vaginal discharge.  Blood tests. HOME CARE INSTRUCTIONS   Only take over-the-counter or prescription medicines for pain, discomfort, or fever as directed by your caregiver.   Rest as directed by your caregiver.   Eat a balanced diet.   Drink enough fluids to make your urine clear or pale yellow, or as directed.   Avoid sexual intercourse if it causes pain.   Apply warm or cold compresses to the lower abdomen depending on which one helps the pain.   Avoid stressful situations.   Keep a journal of your pelvic pain. Write down when it started, where the pain is located, and if there are things that seem to be associated with the pain, such as food or your menstrual cycle.  Follow up with your caregiver as directed.  SEEK MEDICAL CARE IF:  Your medicine does not help your pain.  You have abnormal vaginal discharge. SEEK IMMEDIATE MEDICAL CARE IF:   You have heavy bleeding from the vagina.   Your pelvic pain increases.   You feel light-headed or faint.   You have chills.   You have pain with urination or blood in your urine.   You have uncontrolled diarrhea   or vomiting.   You have a fever or persistent symptoms for more than 3 days.  You have a fever and your symptoms suddenly get worse.   You are being physically or sexually abused.  MAKE SURE YOU:  Understand these instructions.  Will watch your condition.  Will get help if you are not doing well or get worse. Document Released:  11/24/2003 Document Revised: 05/13/2013 Document Reviewed: 04/18/2011 ExitCare Patient Information 2015 [employer]. This information is not intended to replace advice given to you by your health care provider. Make sure you discuss any questions you have with your health care provider.  

## 2014-04-17 DIAGNOSIS — M4806 Spinal stenosis, lumbar region: Secondary | ICD-10-CM | POA: Diagnosis not present

## 2014-04-17 DIAGNOSIS — M461 Sacroiliitis, not elsewhere classified: Secondary | ICD-10-CM | POA: Diagnosis not present

## 2014-04-17 DIAGNOSIS — M47816 Spondylosis without myelopathy or radiculopathy, lumbar region: Secondary | ICD-10-CM | POA: Diagnosis not present

## 2014-04-18 ENCOUNTER — Ambulatory Visit: Payer: Self-pay | Admitting: Internal Medicine

## 2014-04-22 DIAGNOSIS — M47816 Spondylosis without myelopathy or radiculopathy, lumbar region: Secondary | ICD-10-CM | POA: Diagnosis not present

## 2014-05-02 DIAGNOSIS — D251 Intramural leiomyoma of uterus: Secondary | ICD-10-CM | POA: Diagnosis not present

## 2014-05-02 DIAGNOSIS — R102 Pelvic and perineal pain: Secondary | ICD-10-CM | POA: Diagnosis not present

## 2014-05-02 DIAGNOSIS — Z124 Encounter for screening for malignant neoplasm of cervix: Secondary | ICD-10-CM | POA: Diagnosis not present

## 2014-05-02 DIAGNOSIS — Z1231 Encounter for screening mammogram for malignant neoplasm of breast: Secondary | ICD-10-CM | POA: Diagnosis not present

## 2014-05-14 DIAGNOSIS — M5431 Sciatica, right side: Secondary | ICD-10-CM | POA: Diagnosis not present

## 2014-05-14 DIAGNOSIS — M545 Low back pain: Secondary | ICD-10-CM | POA: Diagnosis not present

## 2014-05-21 ENCOUNTER — Ambulatory Visit: Payer: Self-pay | Admitting: Internal Medicine

## 2014-05-21 DIAGNOSIS — M545 Low back pain: Secondary | ICD-10-CM | POA: Diagnosis not present

## 2014-05-21 DIAGNOSIS — M5431 Sciatica, right side: Secondary | ICD-10-CM | POA: Diagnosis not present

## 2014-05-28 DIAGNOSIS — M545 Low back pain: Secondary | ICD-10-CM | POA: Diagnosis not present

## 2014-05-28 DIAGNOSIS — M5431 Sciatica, right side: Secondary | ICD-10-CM | POA: Diagnosis not present

## 2014-06-11 ENCOUNTER — Encounter: Payer: Self-pay | Admitting: *Deleted

## 2014-06-12 DIAGNOSIS — M5431 Sciatica, right side: Secondary | ICD-10-CM | POA: Diagnosis not present

## 2014-06-12 DIAGNOSIS — M545 Low back pain: Secondary | ICD-10-CM | POA: Diagnosis not present

## 2014-06-13 ENCOUNTER — Encounter: Payer: Self-pay | Admitting: Internal Medicine

## 2014-06-13 ENCOUNTER — Ambulatory Visit (INDEPENDENT_AMBULATORY_CARE_PROVIDER_SITE_OTHER): Payer: Medicare Other | Admitting: Internal Medicine

## 2014-06-13 VITALS — BP 170/110 | HR 89 | Temp 98.0°F | Resp 18 | Ht 68.0 in | Wt 167.0 lb

## 2014-06-13 DIAGNOSIS — G8929 Other chronic pain: Secondary | ICD-10-CM | POA: Diagnosis not present

## 2014-06-13 DIAGNOSIS — M159 Polyosteoarthritis, unspecified: Secondary | ICD-10-CM | POA: Diagnosis not present

## 2014-06-13 DIAGNOSIS — M79604 Pain in right leg: Secondary | ICD-10-CM

## 2014-06-13 DIAGNOSIS — M545 Low back pain: Secondary | ICD-10-CM

## 2014-06-13 DIAGNOSIS — M549 Dorsalgia, unspecified: Secondary | ICD-10-CM

## 2014-06-13 DIAGNOSIS — F4329 Adjustment disorder with other symptoms: Secondary | ICD-10-CM

## 2014-06-13 DIAGNOSIS — I10 Essential (primary) hypertension: Secondary | ICD-10-CM

## 2014-06-13 MED ORDER — HYDROCODONE-ACETAMINOPHEN 5-325 MG PO TABS: 1.0000 | ORAL_TABLET | Freq: Four times a day (QID) | ORAL | Status: DC | PRN

## 2014-06-13 MED ORDER — TIZANIDINE HCL 4 MG PO TABS: 4.0000 mg | ORAL_TABLET | Freq: Three times a day (TID) | ORAL | Status: DC | PRN

## 2014-06-13 NOTE — Progress Notes (Signed)
Patient ID: Kaylee Warner, female   DOB: [birth year], 69 y.o.   MRN: [id number]    Location:    [facility]   Place of Service:   OFFICE   Advanced Directive information  NONE  Chief Complaint  Patient presents with  . Establish Care    Establish care    HPI:  69 yo female seen today as a new pt. She c/o severe back pain and sees neurology (Dr Maryruth Eve on [facility]). She has rec'd injections in back for spinal stenosis (last one in 4/16). Last MRI L spine about 6 mos ago. She is due for injection in next 2 weeks. She reports effects of steroid wears off in 1 month. Pain today is 10/10 on scale and interrupts sleep. Pain worsens with movement. She has been taking sister's pain medicine. No relief with tylenol. No longer takes goody powder and 800mg  ibuprofen. No loss of bowel/bladder control. No numbness or tingling. She has constipation. She goes to PT. She has not tried accupuncture yet. Topical -biofreeze, bengay, icy hot - agents temporarily helps. Heat helps more than ice  She has a hx arthritis. She had right knee injection in 2015 but underwent right knee arthroscopy several yrs ago. She was given norco by ortho last yr for knee pain  BP elevated and has been over the last few mos. She takes lisinopril daily. She has taken another med in past but does not recall the name.  She has increased stressors due to being self employed. 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    Past Medical History  Diagnosis Date  . Hypertension   . Arthritis     Past Surgical History  Procedure Laterality Date  . Sark 2009      Dr. Jolee Ewing   . Joint replacement  2008    Patient Care Team: Adrian Prows, PA-C as PCP - General (Physician Assistant)  History   Social History  . Marital Status: Married    Spouse Name: N/A  . Number of Children: N/A  . Years of Education: N/A   Occupational History  . cook     Social History Main Topics  . Smoking status: Never Smoker   . Smokeless  tobacco: Never Used  . Alcohol Use: No  . Drug Use: No  . Sexual Activity: Not on file   Other Topics Concern  . Not on file   Social History Narrative   Diet:Regular   Do you drink/eat things with caffeine? Yes   Marital status:  Married                             What year were you married?    Do you live in a house, apartment, assisted living, condo, trailer, etc)? House   Is it one or more stories? 2   How many persons live in your home? 2  Do you have any pets in your home? No   Current or past profession: Regulatory affairs officer   Do you exercise?   Yes                                                  Type & how often: stretches   Do you have a living will? No    Do you have a  DNR Form?  No   Do you have a POA/HPOA forms? No     reports that she has never smoked. She has never used smokeless tobacco. She reports that she does not drink alcohol or use illicit drugs.  Family History  Problem Relation Age of Onset  . Diabetes      family history   . Arthritis      family history   . Diabetes Sister   . Sjogren's syndrome Daughter   . Diabetes Son   . Hypertension Son   . Diabetes Son    Family Status  Relation Status Death Age  . Sister Alive   . Daughter Alive   . Son Alive   . Son Alive   . Son Alive   . Sister Alive   . Mother Alive   . Father Alive      There is no immunization history on file for this patient.  Allergies  Allergen Reactions  . Celecoxib     REACTION: blood pressure went up  . Codeine Itching    Medications: Patient's Medications  New Prescriptions   No medications on file  Previous Medications   AZITHROMYCIN (ZITHROMAX) 250 MG TABLET    Take 250-500 mg by mouth daily. Take 2 tablets (500 mg) on Day 1 then Take 1 tablet (250 mg) Daily for 4 Days.   DICLOFENAC PO    Take 10 mg by mouth daily.   DIPHENHYDRAMINE-APAP, SLEEP, (GOODYS PM PO)    Take 1 packet by mouth daily as needed (headache).   DULOXETINE (CYMBALTA) 60 MG CAPSULE     Take 60 mg by mouth daily.   GABAPENTIN (NEURONTIN) 100 MG CAPSULE    Take 1 capsule (100 mg total) by mouth 3 (three) times daily.   HYDROCODONE-ACETAMINOPHEN (NORCO/VICODIN) 5-325 MG PER TABLET    Take 0.5-1 tablets by mouth every 4 (four) hours as needed for moderate pain.   HYDROMORPHONE (DILAUDID) 4 MG TABLET    Take 1 tablet (4 mg total) by mouth every 6 (six) hours as needed for severe pain.   IBUPROFEN (ADVIL,MOTRIN) 800 MG TABLET    Take 1 tablet (800 mg total) by mouth every 8 (eight) hours as needed.   LISINOPRIL (PRINIVIL,ZESTRIL) 40 MG TABLET    Take 40 mg by mouth daily.    MELOXICAM (MOBIC) 7.5 MG TABLET    Take 1 tablet (7.5 mg total) by mouth daily.   NABUMETONE (RELAFEN) 750 MG TABLET    Take 1 tablet (750 mg total) by mouth 2 (two) times daily.  Modified Medications   No medications on file  Discontinued Medications   No medications on file    Review of Systems  Constitutional: Positive for fatigue. Negative for fever, chills, diaphoresis, activity change and appetite change.  HENT: Negative for ear pain and sore throat.   Eyes: Negative for visual disturbance.  Respiratory: Positive for cough. Negative for chest tightness and shortness of breath.   Cardiovascular: Negative for chest pain, palpitations and leg swelling.  Gastrointestinal: Positive for constipation. Negative for nausea, vomiting, abdominal pain, diarrhea and blood in stool.  Genitourinary: Negative for dysuria.  Musculoskeletal: Positive for back pain. Negative for arthralgias.  Neurological: Negative for dizziness, tremors, numbness and headaches.  Psychiatric/Behavioral: Positive for dysphoric mood (due to depression). Negative for sleep disturbance. The patient is nervous/anxious (due to stress).     Filed Vitals:   06/13/14 1101  BP: 170/110  Pulse: 89  Temp: 98 F (36.7 C)  TempSrc: Oral  Resp: 18  Height: 5\' 8"  (1.727 m)  Weight: 167 lb (75.751 kg)  SpO2: 97%   Body mass index is 25.4  kg/(m^2).  Physical Exam  Constitutional: She is oriented to person, place, and time. She appears well-developed and well-nourished. No distress.  Looks uncomfortable in NAD. Posture stooped forward  HENT:  Mouth/Throat: Oropharynx is clear and moist. No oropharyngeal exudate.  Eyes: Pupils are equal, round, and reactive to light. No scleral icterus.  Neck: Neck supple. Carotid bruit is not present. No tracheal deviation present. No thyromegaly present.  Cardiovascular: Normal rate, regular rhythm, normal heart sounds and intact distal pulses.  Exam reveals no gallop and no friction rub.   No murmur heard. No LE edema b/l. no calf TTP.   Pulmonary/Chest: Effort normal and breath sounds normal. No stridor. No respiratory distress. She has no wheezes. She has no rales.  Abdominal: Soft. Bowel sounds are normal. She exhibits no distension and no mass. There is no tenderness. There is no rebound and no guarding.  Musculoskeletal:       Back:  Gait antalgic; left short leg; right ASIS TTP with SI joint restriction; (+) SLR R>L; strength 3/5 in RLE and 4/5 in LLE; DTRs reduced R>L knee  Lymphadenopathy:    She has no cervical adenopathy.  Neurological: She is alert and oriented to person, place, and time. She has normal reflexes.  Skin: Skin is warm and dry. No rash noted.  Psychiatric: Her behavior is normal. Judgment and thought content normal. She exhibits a depressed mood.     Labs reviewed: Admission on 04/09/2014, Discharged on 04/09/2014  Component Date Value Ref Range Status  . Color, Urine 04/09/2014 YELLOW  YELLOW Final  . APPearance 04/09/2014 CLEAR  CLEAR Final  . Specific Gravity, Urine 04/09/2014 1.026  1.005 - 1.030 Final  . pH 04/09/2014 6.0  5.0 - 8.0 Final  . Glucose, UA 04/09/2014 NEGATIVE  NEGATIVE mg/dL Final  . Hgb urine dipstick 04/09/2014 NEGATIVE  NEGATIVE Final  . Bilirubin Urine 04/09/2014 NEGATIVE  NEGATIVE Final  . Ketones, ur 04/09/2014 NEGATIVE  NEGATIVE  mg/dL Final  . Protein, ur 04/09/2014 NEGATIVE  NEGATIVE mg/dL Final  . Urobilinogen, UA 04/09/2014 0.2  0.0 - 1.0 mg/dL Final  . Nitrite 04/09/2014 NEGATIVE  NEGATIVE Final  . Leukocytes, UA 04/09/2014 NEGATIVE  NEGATIVE Final   MICROSCOPIC NOT DONE ON URINES WITH NEGATIVE PROTEIN, BLOOD, LEUKOCYTES, NITRITE, OR GLUCOSE <1000 mg/dL.  [name] Wet Prep HPF POC 04/09/2014 NONE SEEN  NONE SEEN Final  . Trich, Wet Prep 04/09/2014 NONE SEEN  NONE SEEN Final  . Clue Cells Wet Prep HPF POC 04/09/2014 NONE SEEN  NONE SEEN Final  . WBC, Wet Prep HPF POC 04/09/2014 MODERATE* NONE SEEN Final    No results found.   Assessment/Plan    ICD-9-CM ICD-10-CM   1. Lumbar pain with radiation down right leg - uncontrolled 724.2 M54.5   2. Chronic back pain greater than 3 months duration - uncontrolled 724.5 M54.9    338.29 G89.29   3. Essential hypertension, benign - with elevated BP today 401.1 I10   4. Generalized OA  715.00 M15.9   5. Stress and adjustment reaction - stable 309.89 F43.29    --Recommend colace 200 mg daily and miralax daily to reduce constipation from narcotic use. Recommend f/u with neurology for epidural sooner rather than later. Continue PT as tolerated  --discussed advanced directives  --Continue blood pressure medicine as ordered  --Follow up  in 2 weeks for re-eval of blood pressure and pain. If pain med effective, she will need to sign a pain contract  Brier Firebaugh S. Perlie Gold  [facility] [address] [place], [place] [address] [phone] Cell (Monday-Friday 8 AM - 5 PM) [phone] After 5 PM and follow prompts

## 2014-06-13 NOTE — Patient Instructions (Signed)
Recommend colace 200 mg daily and miralax daily  Continue blood pressure medicine as ordered  Follow up in 2 weeks for re-eval of blood pressure and pain

## 2014-06-18 DIAGNOSIS — M47816 Spondylosis without myelopathy or radiculopathy, lumbar region: Secondary | ICD-10-CM | POA: Diagnosis not present

## 2014-06-25 DIAGNOSIS — M5431 Sciatica, right side: Secondary | ICD-10-CM | POA: Diagnosis not present

## 2014-06-25 DIAGNOSIS — M545 Low back pain: Secondary | ICD-10-CM | POA: Diagnosis not present

## 2014-06-27 ENCOUNTER — Ambulatory Visit: Payer: Medicare Other | Admitting: Internal Medicine

## 2014-07-02 DIAGNOSIS — M545 Low back pain: Secondary | ICD-10-CM | POA: Diagnosis not present

## 2014-07-02 DIAGNOSIS — M5431 Sciatica, right side: Secondary | ICD-10-CM | POA: Diagnosis not present

## 2014-07-09 DIAGNOSIS — M545 Low back pain: Secondary | ICD-10-CM | POA: Diagnosis not present

## 2014-07-09 DIAGNOSIS — M5431 Sciatica, right side: Secondary | ICD-10-CM | POA: Diagnosis not present

## 2014-07-11 ENCOUNTER — Ambulatory Visit (INDEPENDENT_AMBULATORY_CARE_PROVIDER_SITE_OTHER): Payer: Medicare Other | Admitting: Internal Medicine

## 2014-07-11 ENCOUNTER — Encounter: Payer: Self-pay | Admitting: Internal Medicine

## 2014-07-11 VITALS — BP 138/92 | HR 85 | Temp 97.9°F | Ht 68.0 in | Wt 168.0 lb

## 2014-07-11 DIAGNOSIS — M549 Dorsalgia, unspecified: Secondary | ICD-10-CM | POA: Diagnosis not present

## 2014-07-11 DIAGNOSIS — M159 Polyosteoarthritis, unspecified: Secondary | ICD-10-CM | POA: Diagnosis not present

## 2014-07-11 DIAGNOSIS — M545 Low back pain: Secondary | ICD-10-CM | POA: Diagnosis not present

## 2014-07-11 DIAGNOSIS — G8929 Other chronic pain: Secondary | ICD-10-CM | POA: Diagnosis not present

## 2014-07-11 DIAGNOSIS — M79604 Pain in right leg: Secondary | ICD-10-CM

## 2014-07-11 DIAGNOSIS — I10 Essential (primary) hypertension: Secondary | ICD-10-CM

## 2014-07-11 MED ORDER — HYDROCODONE-ACETAMINOPHEN 7.5-325 MG PO TABS: 1.0000 | ORAL_TABLET | Freq: Four times a day (QID) | ORAL | Status: DC | PRN

## 2014-07-11 NOTE — Progress Notes (Signed)
Patient ID: Kaylee Warner, female   DOB: August 20, 1945, 69 y.o.   MRN: [id number]    Location:    [facility]   Place of Service:   OFFICE  Chief Complaint  Patient presents with  . Follow-up    2 week follow-up on high blood pressure   . Medication Management    Discuss Zanaflex and side effects.     HPI:  69 yo female seen today for f/u BP.  She is currently taking lisinopril daily. Pain was better controlled while taking pain med which helped her BP. No HA, dizziness, CP or SOB.  She tried zanaflex but it caused her to be "evil" and she did not take it again. She ran out of pain pills and has been taking her spouse's. She reports interruption of sleep due to pain. She has paresthesias. No recent falls. No loss of bowel/bladder control.  Past Medical History  Diagnosis Date  . Hypertension   . Arthritis     Past Surgical History  Procedure Laterality Date  . Sark 2009      Dr. Jolee Ewing   . Joint replacement  2008    Patient Care Team: Adrian Prows, PA-C as PCP - General (Physician Assistant)  History   Social History  . Marital Status: Married    Spouse Name: N/A  . Number of Children: N/A  . Years of Education: N/A   Occupational History  . cook     Social History Main Topics  . Smoking status: Never Smoker   . Smokeless tobacco: Never Used  . Alcohol Use: No  . Drug Use: No  . Sexual Activity: Not on file   Other Topics Concern  . Not on file   Social History Narrative   Diet:Regular   Do you drink/eat things with caffeine? Yes   Marital status:  Married                             What year were you married?    Do you live in a house, apartment, assisted living, condo, trailer, etc)? House   Is it one or more stories? 2   How many persons live in your home? 2  Do you have any pets in your home? No   Current or past profession: Regulatory affairs officer   Do you exercise?   Yes                                                  Type & how often: stretches   Do you have  a living will? No    Do you have a DNR Form?  No   Do you have a POA/HPOA forms? No     reports that she has never smoked. She has never used smokeless tobacco. She reports that she does not drink alcohol or use illicit drugs.  Allergies  Allergen Reactions  . Celecoxib     REACTION: blood pressure went up  . Codeine Itching  . Penicillins Rash    Medications: Patient's Medications  New Prescriptions   No medications on file  Previous Medications   DULOXETINE (CYMBALTA) 60 MG CAPSULE    Take 60 mg by mouth daily.   HYDROCODONE-ACETAMINOPHEN (NORCO) 5-325 MG PER TABLET    Take 1-2 tablets by mouth every  6 (six) hours as needed for moderate pain or severe pain.   LISINOPRIL (PRINIVIL,ZESTRIL) 40 MG TABLET    Take 40 mg by mouth daily.    MELOXICAM (MOBIC) 7.5 MG TABLET    Take 1 tablet (7.5 mg total) by mouth daily.   TIZANIDINE (ZANAFLEX) 4 MG TABLET    Take 1 tablet (4 mg total) by mouth every 8 (eight) hours as needed for muscle spasms.  Modified Medications   No medications on file  Discontinued Medications   GABAPENTIN (NEURONTIN) 100 MG CAPSULE    Take 1 capsule (100 mg total) by mouth 3 (three) times daily.    Review of Systems  Constitutional: Positive for activity change and fatigue. Negative for fever, chills and appetite change.  Respiratory: Negative for shortness of breath.   Cardiovascular: Negative for chest pain.  Musculoskeletal: Positive for back pain, arthralgias and gait problem.  Skin: Negative for rash.  Neurological: Positive for weakness and numbness. Negative for dizziness, tremors, seizures and headaches.  Psychiatric/Behavioral: Positive for sleep disturbance, dysphoric mood and decreased concentration. Negative for behavioral problems and confusion. The patient is nervous/anxious.     Filed Vitals:   07/11/14 1540  BP: 138/92  Pulse: 85  Temp: 97.9 F (36.6 C)  TempSrc: Oral  Height: 5\' 8"  (1.727 m)  Weight: 168 lb (76.204 kg)  SpO2: 97%    Body mass index is 25.55 kg/(m^2).  Physical Exam  Constitutional: She appears well-developed and well-nourished. No distress.  Looks uncomfortable  Musculoskeletal:  Gait antalgic  Neurological: She is alert.  Skin: Skin is warm and dry. No rash noted.  Psychiatric: Her speech is normal and behavior is normal. Thought content normal. Her mood appears anxious.     Labs reviewed: No visits with results within 3 Month(s) from this visit. Latest known visit with results is:  Admission on 04/09/2014, Discharged on 04/09/2014  Component Date Value Ref Range Status  . Color, Urine 04/09/2014 YELLOW  YELLOW Final  . APPearance 04/09/2014 CLEAR  CLEAR Final  . Specific Gravity, Urine 04/09/2014 1.026  1.005 - 1.030 Final  . pH 04/09/2014 6.0  5.0 - 8.0 Final  . Glucose, UA 04/09/2014 NEGATIVE  NEGATIVE mg/dL Final  . Hgb urine dipstick 04/09/2014 NEGATIVE  NEGATIVE Final  . Bilirubin Urine 04/09/2014 NEGATIVE  NEGATIVE Final  . Ketones, ur 04/09/2014 NEGATIVE  NEGATIVE mg/dL Final  . Protein, ur 04/09/2014 NEGATIVE  NEGATIVE mg/dL Final  . Urobilinogen, UA 04/09/2014 0.2  0.0 - 1.0 mg/dL Final  . Nitrite 04/09/2014 NEGATIVE  NEGATIVE Final  . Leukocytes, UA 04/09/2014 NEGATIVE  NEGATIVE Final   MICROSCOPIC NOT DONE ON URINES WITH NEGATIVE PROTEIN, BLOOD, LEUKOCYTES, NITRITE, OR GLUCOSE <1000 mg/dL.  [name] Wet Prep HPF POC 04/09/2014 NONE SEEN  NONE SEEN Final  . Trich, Wet Prep 04/09/2014 NONE SEEN  NONE SEEN Final  . Clue Cells Wet Prep HPF POC 04/09/2014 NONE SEEN  NONE SEEN Final  . WBC, Wet Prep HPF POC 04/09/2014 MODERATE* NONE SEEN Final    No results found.   Assessment/Plan   ICD-9-CM ICD-10-CM   1. Lumbar pain with radiation down right leg - unchanged 724.2 M54.5   2. Chronic back pain greater than 3 months duration - pain improved on pain med 724.5 M54.9    338.29 G89.29   3. Generalized OA 715.00 M15.9   4. Essential hypertension, benign - BP improved today  with pain control 401.1 I10    --start topical pain med. Rx completed  and faxed to specialized pharmacy  --Continue current medications as ordered  --Resume muscle relaxer (zanaflex)  --No heavy lifting, pushing, pulling, climbing, stooping x 5 days  --Follow up in 1 month for routine visit  Lakiya Cottam S. Perlie Gold  [facility] [address] [place], [place] [address] [phone] Cell (Monday-Friday 8 AM - 5 PM) [phone] After 5 PM and follow prompts

## 2014-07-11 NOTE — Patient Instructions (Addendum)
Topical pain cream pharmacy will contact you regarding medicine  Continue current medications as ordered  Resume muscle relaxer (zanaflex)  No heavy lifting, pushing, pulling, climbing, stooping x 5 days  Follow up in 1 month for routine visit

## 2014-07-21 ENCOUNTER — Other Ambulatory Visit: Payer: Self-pay | Admitting: Orthopedic Surgery

## 2014-07-24 DIAGNOSIS — M545 Low back pain: Secondary | ICD-10-CM | POA: Diagnosis not present

## 2014-07-24 DIAGNOSIS — M5431 Sciatica, right side: Secondary | ICD-10-CM | POA: Diagnosis not present

## 2014-07-24 DIAGNOSIS — M47816 Spondylosis without myelopathy or radiculopathy, lumbar region: Secondary | ICD-10-CM | POA: Diagnosis not present

## 2014-07-28 ENCOUNTER — Other Ambulatory Visit: Payer: Self-pay | Admitting: *Deleted

## 2014-07-28 DIAGNOSIS — M1710 Unilateral primary osteoarthritis, unspecified knee: Secondary | ICD-10-CM

## 2014-07-28 DIAGNOSIS — M179 Osteoarthritis of knee, unspecified: Secondary | ICD-10-CM

## 2014-07-28 MED ORDER — MELOXICAM 7.5 MG PO TABS: 7.5000 mg | ORAL_TABLET | Freq: Every day | ORAL | Status: DC

## 2014-07-31 DIAGNOSIS — M545 Low back pain: Secondary | ICD-10-CM | POA: Diagnosis not present

## 2014-07-31 DIAGNOSIS — M5431 Sciatica, right side: Secondary | ICD-10-CM | POA: Diagnosis not present

## 2014-08-07 DIAGNOSIS — M545 Low back pain: Secondary | ICD-10-CM | POA: Diagnosis not present

## 2014-08-07 DIAGNOSIS — M5431 Sciatica, right side: Secondary | ICD-10-CM | POA: Diagnosis not present

## 2014-08-13 DIAGNOSIS — M47816 Spondylosis without myelopathy or radiculopathy, lumbar region: Secondary | ICD-10-CM | POA: Diagnosis not present

## 2014-08-13 DIAGNOSIS — I10 Essential (primary) hypertension: Secondary | ICD-10-CM | POA: Diagnosis not present

## 2014-08-15 ENCOUNTER — Ambulatory Visit: Payer: Medicare Other | Admitting: Internal Medicine

## 2014-08-27 ENCOUNTER — Encounter: Payer: Self-pay | Admitting: Internal Medicine

## 2014-08-27 ENCOUNTER — Ambulatory Visit (INDEPENDENT_AMBULATORY_CARE_PROVIDER_SITE_OTHER): Payer: Medicare Other | Admitting: Internal Medicine

## 2014-08-27 VITALS — BP 160/100 | HR 101 | Temp 98.0°F | Resp 20 | Ht 68.0 in | Wt 167.0 lb

## 2014-08-27 DIAGNOSIS — I10 Essential (primary) hypertension: Secondary | ICD-10-CM

## 2014-08-27 DIAGNOSIS — M5431 Sciatica, right side: Secondary | ICD-10-CM | POA: Diagnosis not present

## 2014-08-27 DIAGNOSIS — M159 Polyosteoarthritis, unspecified: Secondary | ICD-10-CM

## 2014-08-27 DIAGNOSIS — M25559 Pain in unspecified hip: Secondary | ICD-10-CM

## 2014-08-27 DIAGNOSIS — M545 Low back pain: Secondary | ICD-10-CM | POA: Diagnosis not present

## 2014-08-27 DIAGNOSIS — F4329 Adjustment disorder with other symptoms: Secondary | ICD-10-CM

## 2014-08-27 MED ORDER — OXYCODONE HCL 5 MG PO TABS: 5.0000 mg | ORAL_TABLET | ORAL | Status: DC | PRN

## 2014-08-27 NOTE — Progress Notes (Signed)
Patient ID: Kaylee Warner, female   DOB: 1945/07/07, 69 y.o.   MRN: [id number]    Location:    [facility]   Place of Service:  OFFICE   Chief Complaint  Patient presents with  . Medical Management of Chronic Issues    1 month follow-up    HPI:  69 yo female seen today for f/u. She had nerve block at beginning of August which has helped posture and pain migrated to groin R>L. She continues to have RLE pain radiating from back. Pain also intense in perineum and vaginal area. She never rec'd topical analgesic agent. She ran out norco and is taking a friend's dose. No loss of bowel/bladder control. No falls. She feels depressed due to the pain. 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 She does not recall the name of her previous BP med. Occasional HA but no dizziness, CP or SOB  Past Medical History  Diagnosis Date  . Hypertension   . Arthritis     Past Surgical History  Procedure Laterality Date  . Sark 2009      Dr. Jolee Ewing   . Joint replacement  2008    Patient Care Team: Adrian Prows, PA-C as PCP - General (Physician Assistant)  Social History   Social History  . Marital Status: Married    Spouse Name: N/A  . Number of Children: N/A  . Years of Education: N/A   Occupational History  . cook     Social History Main Topics  . Smoking status: Never Smoker   . Smokeless tobacco: Never Used  . Alcohol Use: No  . Drug Use: No  . Sexual Activity: Not on file   Other Topics Concern  . Not on file   Social History Narrative   Diet:Regular   Do you drink/eat things with caffeine? Yes   Marital status:  Married                             What year were you married?    Do you live in a house, apartment, assisted living, condo, trailer, etc)? House   Is it one or more stories? 2   How many persons live in your home? 2  Do you have any pets in your home? No   Current or past profession: Nurse, mental health   Do you exercise?   Yes                                                  Type & how often: stretches   Do you have a living will? No    Do you have a DNR Form?  No   Do you have a POA/HPOA forms? No     reports that she has never smoked. She has never used smokeless tobacco. She reports that she does not drink alcohol or use illicit drugs.  Allergies  Allergen Reactions  . Celecoxib     REACTION: blood pressure went up  . Codeine Itching  . Penicillins Rash    Medications: Patient's Medications  New Prescriptions   No medications on file  Previous Medications   DULOXETINE (CYMBALTA) 60 MG CAPSULE    Take 60 mg  by mouth daily.   HYDROCODONE-ACETAMINOPHEN (NORCO) 7.5-325 MG PER TABLET    Take 1 tablet by mouth every 6 (six) hours as needed for moderate pain or severe pain.   LISINOPRIL (PRINIVIL,ZESTRIL) 40 MG TABLET    Take 40 mg by mouth daily.    MELOXICAM (MOBIC) 7.5 MG TABLET    Take 1 tablet (7.5 mg total) by mouth daily.   TIZANIDINE (ZANAFLEX) 4 MG TABLET    Take 1 tablet (4 mg total) by mouth every 8 (eight) hours as needed for muscle spasms.  Modified Medications   No medications on file  Discontinued Medications   No medications on file    Review of Systems  Constitutional: Positive for activity change and appetite change. Negative for fever, chills, diaphoresis and fatigue.  HENT: Negative for ear pain and sore throat.   Eyes: Negative for visual disturbance.  Respiratory: Negative for cough, chest tightness and shortness of breath.   Cardiovascular: Negative for chest pain, palpitations and leg swelling.  Gastrointestinal: Negative for nausea, vomiting, abdominal pain, diarrhea, constipation and blood in stool.  Genitourinary: Negative for dysuria.  Musculoskeletal: Positive for back pain, arthralgias and gait problem.  Neurological: Positive for weakness, numbness and headaches. Negative for dizziness and tremors.  Psychiatric/Behavioral: Positive for  sleep disturbance and dysphoric mood. The patient is not nervous/anxious.     Filed Vitals:   08/27/14 1601  BP: 160/100  Pulse: 101  Temp: 98 F (36.7 C)  TempSrc: Oral  Resp: 20  Height: 5\' 8"  (1.727 m)  Weight: 167 lb (75.751 kg)  SpO2: 96%   Body mass index is 25.4 kg/(m^2).  Physical Exam  Constitutional: She is oriented to person, place, and time. She appears well-developed and well-nourished.  Looks uncomfortable in NAD  HENT:  Mouth/Throat: Oropharynx is clear and moist. No oropharyngeal exudate.  Eyes: Pupils are equal, round, and reactive to light. No scleral icterus.  Neck: Neck supple. Carotid bruit is not present. No tracheal deviation present. No thyromegaly present.  Cardiovascular: Regular rhythm and intact distal pulses.  Tachycardia present.  Exam reveals no gallop and no friction rub.   Murmur heard.  Systolic murmur is present with a grade of 1/6  No LE edema b/l. no calf TTP.   Pulmonary/Chest: Effort normal and breath sounds normal. No stridor. No respiratory distress. She has no wheezes. She has no rales.  Abdominal: Soft. Bowel sounds are normal. She exhibits no distension and no mass. There is no hepatomegaly. There is no tenderness. There is no rebound and no guarding.  Musculoskeletal: She exhibits edema and tenderness.  Reduced right hip internal rotation; no pubic symphysis TTP; left short leg; (+) left SLR; gait antalgic; right knee swelling and warmth to touch with reduced ROM; no greater trochanteric TTP; right iliotibial band hypertrophy with ropy tissue texture changes  Lymphadenopathy:    She has no cervical adenopathy.  Neurological: She is alert and oriented to person, place, and time. She has normal reflexes.  Skin: Skin is warm and dry. No rash noted.  Psychiatric: Thought content normal. She is agitated. She exhibits a depressed mood.     Labs reviewed: No visits with results within 3 Month(s) from this visit. Latest known visit with  results is:  Admission on 04/09/2014, Discharged on 04/09/2014  Component Date Value Ref Range Status  . Color, Urine 04/09/2014 YELLOW  YELLOW Final  . APPearance 04/09/2014 CLEAR  CLEAR Final  . Specific Gravity, Urine 04/09/2014 1.026  1.005 - 1.030 Final  .  pH 04/09/2014 6.0  5.0 - 8.0 Final  . Glucose, UA 04/09/2014 NEGATIVE  NEGATIVE mg/dL Final  . Hgb urine dipstick 04/09/2014 NEGATIVE  NEGATIVE Final  . Bilirubin Urine 04/09/2014 NEGATIVE  NEGATIVE Final  . Ketones, ur 04/09/2014 NEGATIVE  NEGATIVE mg/dL Final  . Protein, ur 04/09/2014 NEGATIVE  NEGATIVE mg/dL Final  . Urobilinogen, UA 04/09/2014 0.2  0.0 - 1.0 mg/dL Final  . Nitrite 04/09/2014 NEGATIVE  NEGATIVE Final  . Leukocytes, UA 04/09/2014 NEGATIVE  NEGATIVE Final   MICROSCOPIC NOT DONE ON URINES WITH NEGATIVE PROTEIN, BLOOD, LEUKOCYTES, NITRITE, OR GLUCOSE <1000 mg/dL.  [name] Wet Prep HPF POC 04/09/2014 NONE SEEN  NONE SEEN Final  . Trich, Wet Prep 04/09/2014 NONE SEEN  NONE SEEN Final  . Clue Cells Wet Prep HPF POC 04/09/2014 NONE SEEN  NONE SEEN Final  . WBC, Wet Prep HPF POC 04/09/2014 MODERATE* NONE SEEN Final    No results found.   Assessment/Plan   ICD-9-CM ICD-10-CM   1. Pain in joint, pelvic region and thigh, unspecified laterality - back pain and sciatica improved but pelvic pain now present 719.45 M25.559 oxyCODONE (OXY IR/ROXICODONE) 5 MG immediate release tablet     DG HIPS BILAT WITH PELVIS 3-4 VIEWS  2. Generalized OA 715.00 M15.9 oxyCODONE (OXY IR/ROXICODONE) 5 MG immediate release tablet     DG HIPS BILAT WITH PELVIS 3-4 VIEWS  3. Essential hypertension, benign - suboptimally controlled due to pain 401.1 I10   4. Stress and adjustment reaction - due to uncontrolled pain 309.89 F43.29     Check blood pressure at home daily. If remains >160/100, call office.  Check pelvic xrays and hip xrays  Follow up in 2 weeks and as needed  Take new pain medicine with food. Do not drive or operate heavy  machinery until you know how medicine will affect you.  Camree Wigington S. Perlie Gold  [facility] [address] [place], [place] [address] [phone] Cell (Monday-Friday 8 AM - 5 PM) [phone] After 5 PM and follow prompts

## 2014-08-27 NOTE — Patient Instructions (Signed)
Check blood pressure at home daily. If remains >160/100, call office.  Follow up in 2 weeks and as needed  Take new pain medicine with food. Do not drive or operate heavy machinery until you know how medicine will affect you.

## 2014-09-03 DIAGNOSIS — M545 Low back pain: Secondary | ICD-10-CM | POA: Diagnosis not present

## 2014-09-03 DIAGNOSIS — M5431 Sciatica, right side: Secondary | ICD-10-CM | POA: Diagnosis not present

## 2014-09-04 ENCOUNTER — Telehealth: Payer: Self-pay

## 2014-09-04 NOTE — Telephone Encounter (Signed)
Message left on triage voicemail- patient with ongoing pain and would like rx for cream to help with pain. Patient would like rx sent to [facility] on file. Please advise   Last OV 08/27/14, pending OV 09/10/14

## 2014-09-04 NOTE — Telephone Encounter (Signed)
There is a specialty pharmacy that handles pain cream. Form will need to be completed when I am in the office tomorrow

## 2014-09-05 NOTE — Telephone Encounter (Signed)
Form completed and given to Dr.Carter to sign, then to be faxed.   Left message on voicemail for patient to return call when available, reason for call: give status update

## 2014-09-08 NOTE — Telephone Encounter (Signed)
Spoke with patient, patient aware form faxed to speciality pharmacy

## 2014-09-09 ENCOUNTER — Encounter: Payer: Self-pay | Admitting: Orthopedic Surgery

## 2014-09-09 ENCOUNTER — Ambulatory Visit (INDEPENDENT_AMBULATORY_CARE_PROVIDER_SITE_OTHER): Payer: Medicare Other | Admitting: Orthopedic Surgery

## 2014-09-09 ENCOUNTER — Ambulatory Visit (INDEPENDENT_AMBULATORY_CARE_PROVIDER_SITE_OTHER): Payer: Medicare Other

## 2014-09-09 VITALS — BP 139/90 | Ht 68.0 in | Wt 167.0 lb

## 2014-09-09 DIAGNOSIS — M25561 Pain in right knee: Secondary | ICD-10-CM | POA: Diagnosis not present

## 2014-09-09 DIAGNOSIS — G894 Chronic pain syndrome: Secondary | ICD-10-CM | POA: Diagnosis not present

## 2014-09-09 DIAGNOSIS — M4806 Spinal stenosis, lumbar region: Secondary | ICD-10-CM

## 2014-09-09 DIAGNOSIS — M48061 Spinal stenosis, lumbar region without neurogenic claudication: Secondary | ICD-10-CM

## 2014-09-09 MED ORDER — METHYLPREDNISOLONE ACETATE 40 MG/ML IJ SUSP
40.0000 mg | Freq: Once | INTRAMUSCULAR | Status: AC
  Administered 2014-09-09: 40 mg via INTRAMUSCULAR

## 2014-09-09 MED ORDER — HYDROCODONE-ACETAMINOPHEN 5-325 MG PO TABS
1.0000 | ORAL_TABLET | Freq: Four times a day (QID) | ORAL | Status: DC | PRN
Start: 2014-09-09 — End: 2014-09-10

## 2014-09-09 NOTE — Progress Notes (Signed)
Patient ID: Kaylee Warner, female   DOB: 1945-07-20, 69 y.o.   MRN: [id number]  Follow up visit  Chief Complaint  Patient presents with  . Follow-up    follow up right knee pain + swelling    BP 139/90 mmHg  Ht 5\' 8"  (1.727 m)  Wt 167 lb (75.751 kg)  BMI 25.40 kg/m2  Encounter Diagnoses  Name Primary?  . Right knee pain Yes  . Chronic pain syndrome   . Spinal stenosis of lumbar region     The patient was sent back to me for evaluation of her right knee for swelling. 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 She has had injections, nerve ablation in physical therapy she has not improved and was "looking for miracle" from me today. Her knee is not swollen in terms of an effusion she is not having knee pain.  However she is in a lot of pain.  I did go ahead and reevaluate the knee however. She seems to walk normally has minimal joint line tenderness laterally she has a functional range of motion her knee is stable motor exam is normal she's neurovascularly intact  I advised her to seek neurosurgical follow-up as her nonoperative therapies all seem to be helping upper on 60 tablets of hydrocodone 5 mg every 4 when necessary continue her anti-inflammatory follow-up with me as needed.

## 2014-09-09 NOTE — Patient Instructions (Signed)
Return to [facility], ask to see the neurosurgeon

## 2014-09-09 NOTE — Addendum Note (Signed)
Addended by: Baldomero Lamy B on: 09/09/2014 05:29 PM   Modules accepted: Orders

## 2014-09-10 ENCOUNTER — Ambulatory Visit
Admission: RE | Admit: 2014-09-10 | Discharge: 2014-09-10 | Disposition: A | Discharge status: Home / Self Care | Payer: Medicare Other | Source: Ambulatory Visit | Attending: Internal Medicine | Admitting: Internal Medicine

## 2014-09-10 ENCOUNTER — Encounter: Payer: Self-pay | Admitting: Internal Medicine

## 2014-09-10 ENCOUNTER — Ambulatory Visit (INDEPENDENT_AMBULATORY_CARE_PROVIDER_SITE_OTHER): Payer: Medicare Other | Admitting: Internal Medicine

## 2014-09-10 VITALS — BP 130/82 | HR 95 | Temp 97.9°F | Resp 20 | Ht 68.0 in | Wt 169.2 lb

## 2014-09-10 DIAGNOSIS — M25559 Pain in unspecified hip: Secondary | ICD-10-CM | POA: Diagnosis not present

## 2014-09-10 DIAGNOSIS — M159 Polyosteoarthritis, unspecified: Secondary | ICD-10-CM

## 2014-09-10 DIAGNOSIS — I10 Essential (primary) hypertension: Secondary | ICD-10-CM

## 2014-09-10 DIAGNOSIS — M545 Low back pain, unspecified: Secondary | ICD-10-CM

## 2014-09-10 DIAGNOSIS — M25552 Pain in left hip: Secondary | ICD-10-CM | POA: Diagnosis not present

## 2014-09-10 DIAGNOSIS — M5431 Sciatica, right side: Secondary | ICD-10-CM | POA: Diagnosis not present

## 2014-09-10 DIAGNOSIS — M79604 Pain in right leg: Secondary | ICD-10-CM

## 2014-09-10 DIAGNOSIS — M25551 Pain in right hip: Secondary | ICD-10-CM | POA: Diagnosis not present

## 2014-09-10 MED ORDER — HYDROCODONE-ACETAMINOPHEN 10-325 MG PO TABS: ORAL_TABLET | ORAL | Status: DC

## 2014-09-10 NOTE — Patient Instructions (Signed)
Resume Norco  Start topical analgesic as directed  Continue other medications as ordered  Follow up in 3 weeks or prn for pain. Get pelvic xray as soon as possible

## 2014-09-10 NOTE — Progress Notes (Signed)
Patient ID: Kaylee Warner, female   DOB: Nov 12, 1945, 69 y.o.   MRN: [id number]    Location:    [facility]   Place of Service:  OFFICE   Chief Complaint  Patient presents with  . Medical Management of Chronic Issues    2 week for medical management    HPI:  69 yo female seen today for f/u pelvic pain. She never went for pelvic xray after her last OV. She saw Ortho for right knee swelling and was told effusion not large enough for drainage. It was recommended that she return to back specialist for further eval of sciatica. She still has pain radiating into vaginal area. Pain is 6-7/10 on scale. Posture is better. She ran out of oxyIR but states it only lasted for 2 hrs before she had to take more pills. Currently taking goody powder, ibuprofen. She takes cymbalta, meloxicam. norco worked better. She rec'd the topical analgesic by [employer] today but has not tried it yet.  She resumed amlodipine 10mg  daily 1 week ago and BP improved.  Past Medical History  Diagnosis Date  . Hypertension   . Arthritis     Past Surgical History  Procedure Laterality Date  . Sark 2009      Dr. Jolee Ewing   . Joint replacement  2008    Patient Care Team: Gildardo Cranker, DO as PCP - General (Internal Medicine)  Social History   Social History  . Marital Status: Married    Spouse Name: N/A  . Number of Children: N/A  . Years of Education: N/A   Occupational History  . cook     Social History Main Topics  . Smoking status: Never Smoker   . Smokeless tobacco: Never Used  . Alcohol Use: No  . Drug Use: No  . Sexual Activity: Not on file   Other Topics Concern  . Not on file   Social History Narrative   Diet:Regular   Do you drink/eat things with caffeine? Yes   Marital status:  Married                             What year were you married?    Do you live in a house, apartment, assisted living, condo, trailer, etc)? House   Is it one or more stories? 2   How many persons live in your home? 2  Do you  have any pets in your home? No   Current or past profession: Regulatory affairs officer   Do you exercise?   Yes                                                  Type & how often: stretches   Do you have a living will? No    Do you have a DNR Form?  No   Do you have a POA/HPOA forms? No     reports that she has never smoked. She has never used smokeless tobacco. She reports that she does not drink alcohol or use illicit drugs.  Allergies  Allergen Reactions  . Celecoxib     REACTION: blood pressure went up  . Codeine Itching  . Penicillins Rash    Medications: Patient's Medications  New Prescriptions   No medications on file  Previous Medications   DULOXETINE (CYMBALTA)  60 MG CAPSULE    Take 60 mg by mouth daily.   HYDROCODONE-ACETAMINOPHEN (NORCO/VICODIN) 5-325 MG PER TABLET    Take 1 tablet by mouth every 6 (six) hours as needed for moderate pain.   LISINOPRIL (PRINIVIL,ZESTRIL) 40 MG TABLET    Take 40 mg by mouth daily.    MELOXICAM (MOBIC) 7.5 MG TABLET    Take 1 tablet (7.5 mg total) by mouth daily.  Modified Medications   No medications on file  Discontinued Medications   No medications on file    Review of Systems  Constitutional: Positive for activity change. Negative for fever, chills and appetite change.  Gastrointestinal: Negative for nausea, vomiting and abdominal pain.  Genitourinary: Positive for pelvic pain. Negative for dysuria, frequency, hematuria, flank pain, difficulty urinating and dyspareunia.  Musculoskeletal: Positive for back pain, joint swelling, arthralgias and gait problem.  Psychiatric/Behavioral: Positive for sleep disturbance and dysphoric mood. The patient is not nervous/anxious.     Filed Vitals:   09/10/14 1425  BP: 130/82  Pulse: 95  Temp: 97.9 F (36.6 C)  TempSrc: Oral  Resp: 20  Height: 5\' 8"  (1.727 m)  Weight: 169 lb 3.2 oz (76.749 kg)  SpO2: 98%   Body mass index is 25.73 kg/(m^2).  Physical Exam  Constitutional: She is oriented to  person, place, and time. She appears well-developed and well-nourished. No distress.  Musculoskeletal: She exhibits edema and tenderness.  Posture improved. Gait is antalgic  Neurological: She is alert and oriented to person, place, and time.  Skin: Skin is warm and dry. No rash noted.  Psychiatric: Her speech is normal and behavior is normal. Judgment and thought content normal. Cognition and memory are normal. She exhibits a depressed mood.     Labs reviewed: No visits with results within 3 Month(s) from this visit. Latest known visit with results is:  Admission on 04/09/2014, Discharged on 04/09/2014  Component Date Value Ref Range Status  . Color, Urine 04/09/2014 YELLOW  YELLOW Final  . APPearance 04/09/2014 CLEAR  CLEAR Final  . Specific Gravity, Urine 04/09/2014 1.026  1.005 - 1.030 Final  . pH 04/09/2014 6.0  5.0 - 8.0 Final  . Glucose, UA 04/09/2014 NEGATIVE  NEGATIVE mg/dL Final  . Hgb urine dipstick 04/09/2014 NEGATIVE  NEGATIVE Final  . Bilirubin Urine 04/09/2014 NEGATIVE  NEGATIVE Final  . Ketones, ur 04/09/2014 NEGATIVE  NEGATIVE mg/dL Final  . Protein, ur 04/09/2014 NEGATIVE  NEGATIVE mg/dL Final  . Urobilinogen, UA 04/09/2014 0.2  0.0 - 1.0 mg/dL Final  . Nitrite 04/09/2014 NEGATIVE  NEGATIVE Final  . Leukocytes, UA 04/09/2014 NEGATIVE  NEGATIVE Final   MICROSCOPIC NOT DONE ON URINES WITH NEGATIVE PROTEIN, BLOOD, LEUKOCYTES, NITRITE, OR GLUCOSE <1000 mg/dL.  [name] Wet Prep HPF POC 04/09/2014 NONE SEEN  NONE SEEN Final  . Dalbert Batman, Wet Prep 04/09/2014 NONE SEEN  NONE SEEN Final  . Clue Cells Wet Prep HPF POC 04/09/2014 NONE SEEN  NONE SEEN Final  . WBC, Wet Prep HPF POC 04/09/2014 MODERATE* NONE SEEN Final    Dg Knee Ap/lat W/sunrise Right  09/09/2014   3 views of the right knee  The patient is well centered patella with patellofemoral disease involving  the medial facet although lateral and medial osteophytes are seen.  The knee is in excessive valgus with  osteophytes medially and laterally  joint space narrowing lateral compartment moderate to severe greater than  70%.  Impression valgus osteoarthritis with some patellofemoral arthritis as  well. (14 valgus )  Assessment/Plan   ICD-9-CM ICD-10-CM   1. Pain in joint, pelvic region and thigh, unspecified laterality- failing to change as exected 719.45 M25.559 HYDROcodone-acetaminophen (NORCO) 10-325 MG per tablet  2. Generalized OA - stable 715.00 M15.9 HYDROcodone-acetaminophen (NORCO) 10-325 MG per tablet  3. Essential hypertension, benign - stable 401.1 I10   4. Lumbar pain with radiation down right leg - failing to change as expected 724.2 M54.5 HYDROcodone-acetaminophen (NORCO) 10-325 MG per tablet    --pain control - resume norco at 10/325 q4-6hrs prn mod - sev pain  --cont other meds as ordered  --get pelvic xrays  --follow up in 3 weeks. If pain no better, will try hysingla.  Bharat Antillon S. Perlie Gold  [facility] [address] [place], [place] [address] [phone] Cell (Monday-Friday 8 AM - 5 PM) [phone] After 5 PM and follow prompts

## 2014-09-18 DIAGNOSIS — M47816 Spondylosis without myelopathy or radiculopathy, lumbar region: Secondary | ICD-10-CM | POA: Diagnosis not present

## 2014-09-18 DIAGNOSIS — M4806 Spinal stenosis, lumbar region: Secondary | ICD-10-CM | POA: Diagnosis not present

## 2014-09-18 DIAGNOSIS — M461 Sacroiliitis, not elsewhere classified: Secondary | ICD-10-CM | POA: Diagnosis not present

## 2014-09-24 DIAGNOSIS — M5431 Sciatica, right side: Secondary | ICD-10-CM | POA: Diagnosis not present

## 2014-09-24 DIAGNOSIS — M545 Low back pain: Secondary | ICD-10-CM | POA: Diagnosis not present

## 2014-10-01 ENCOUNTER — Ambulatory Visit (INDEPENDENT_AMBULATORY_CARE_PROVIDER_SITE_OTHER): Payer: Medicare Other | Admitting: Internal Medicine

## 2014-10-01 ENCOUNTER — Encounter: Payer: Self-pay | Admitting: Internal Medicine

## 2014-10-01 VITALS — BP 136/86 | HR 91 | Temp 97.9°F | Resp 18 | Ht 68.0 in | Wt 166.4 lb

## 2014-10-01 DIAGNOSIS — M5431 Sciatica, right side: Secondary | ICD-10-CM | POA: Diagnosis not present

## 2014-10-01 DIAGNOSIS — M25559 Pain in unspecified hip: Secondary | ICD-10-CM | POA: Diagnosis not present

## 2014-10-01 DIAGNOSIS — M545 Low back pain: Secondary | ICD-10-CM | POA: Diagnosis not present

## 2014-10-01 DIAGNOSIS — M79604 Pain in right leg: Secondary | ICD-10-CM

## 2014-10-01 NOTE — Patient Instructions (Signed)
Continue current medications as ordered  Follow up with PT as scheduled  Follow up in 8 weeks for routine visit

## 2014-10-01 NOTE — Progress Notes (Signed)
Patient ID: Kaylee Warner, female   DOB: 03-07-1945, 69 y.o.   MRN: [id number]    Location:    [facility]   Place of Service:   OFFICE  Chief Complaint  Patient presents with  . Medical Management of Chronic Issues    3 week follow-up for pain    HPI:  69 yo female seen today for f/u hip/thigh pain. She returned to pain specialist after her last OV and was given medrol dose pak which helped tremendously. Pain is 2-3/10 on scale today. She is still going to PT. She is able to stand to work for 4-5 hrs prior to pain returning. Currently takes 2 pain pills daily and muscle relaxer at bedtime. She is sleeping well. No constipation but is taking miralax daily.  She is planning a trip to [place] this weekend.   She has 5 time per night nocturia and has end stream aching sensation. Not interested in starting med at this time. She has never seen urology  Past Medical History  Diagnosis Date  . Hypertension   . Arthritis     Past Surgical History  Procedure Laterality Date  . Sark 2009      Dr. Jolee Ewing   . Joint replacement  2008    Patient Care Team: Gildardo Cranker, DO as PCP - General (Internal Medicine)  Social History   Social History  . Marital Status: Married    Spouse Name: N/A  . Number of Children: N/A  . Years of Education: N/A   Occupational History  . cook     Social History Main Topics  . Smoking status: Never Smoker   . Smokeless tobacco: Never Used  . Alcohol Use: No  . Drug Use: No  . Sexual Activity: Not on file   Other Topics Concern  . Not on file   Social History Narrative   Diet:Regular   Do you drink/eat things with caffeine? Yes   Marital status:  Married                             What year were you married?    Do you live in a house, apartment, assisted living, condo, trailer, etc)? House   Is it one or more stories? 2   How many persons live in your home? 2  Do you have any pets in your home? No   Current or past profession: Regulatory affairs officer     Do you exercise?   Yes                                                  Type & how often: stretches   Do you have a living will? No    Do you have a DNR Form?  No   Do you have a POA/HPOA forms? No     reports that she has never smoked. She has never used smokeless tobacco. She reports that she does not drink alcohol or use illicit drugs.  Allergies  Allergen Reactions  . Celecoxib     REACTION: blood pressure went up  . Codeine Itching  . Penicillins Rash    Medications: Patient's Medications  New Prescriptions   No medications on file  Previous Medications   AMLODIPINE (NORVASC) 10 MG TABLET    Take 10  mg by mouth daily.   DULOXETINE (CYMBALTA) 60 MG CAPSULE    Take 60 mg by mouth daily.   HYDROCODONE-ACETAMINOPHEN (NORCO) 10-325 MG PER TABLET    Take 1 tab q4 - 6hrs prn moderate - severe pain   LISINOPRIL (PRINIVIL,ZESTRIL) 40 MG TABLET    Take 40 mg by mouth daily.    MELOXICAM (MOBIC) 7.5 MG TABLET    Take 1 tablet (7.5 mg total) by mouth daily.   TIZANIDINE (ZANAFLEX) 4 MG TABLET    Take 1 tablet by mouth every 8 hours as needed for muscle spasm  Modified Medications   No medications on file  Discontinued Medications   No medications on file    Review of Systems  Constitutional: Negative for activity change, appetite change and fatigue.  Musculoskeletal: Positive for back pain, arthralgias and gait problem.  Neurological: Negative for weakness and numbness.    Filed Vitals:   10/01/14 1336  BP: 136/86  Pulse: 91  Temp: 97.9 F (36.6 C)  TempSrc: Oral  Resp: 18  Height: 5\' 8"  (1.727 m)  Weight: 166 lb 6.4 oz (75.479 kg)  SpO2: 96%   Body mass index is 25.31 kg/(m^2).  Physical Exam  Constitutional: She is oriented to person, place, and time. She appears well-developed and well-nourished. No distress.  Neurological: She is alert and oriented to person, place, and time.  Skin: Skin is warm and dry. No rash noted.  Psychiatric: She has a normal mood and  affect. Her behavior is normal. Judgment and thought content normal.     Labs reviewed: No visits with results within 3 Month(s) from this visit. Latest known visit with results is:  Admission on 04/09/2014, Discharged on 04/09/2014  Component Date Value Ref Range Status  . Color, Urine 04/09/2014 YELLOW  YELLOW Final  . APPearance 04/09/2014 CLEAR  CLEAR Final  . Specific Gravity, Urine 04/09/2014 1.026  1.005 - 1.030 Final  . pH 04/09/2014 6.0  5.0 - 8.0 Final  . Glucose, UA 04/09/2014 NEGATIVE  NEGATIVE mg/dL Final  . Hgb urine dipstick 04/09/2014 NEGATIVE  NEGATIVE Final  . Bilirubin Urine 04/09/2014 NEGATIVE  NEGATIVE Final  . Ketones, ur 04/09/2014 NEGATIVE  NEGATIVE mg/dL Final  . Protein, ur 04/09/2014 NEGATIVE  NEGATIVE mg/dL Final  . Urobilinogen, UA 04/09/2014 0.2  0.0 - 1.0 mg/dL Final  . Nitrite 04/09/2014 NEGATIVE  NEGATIVE Final  . Leukocytes, UA 04/09/2014 NEGATIVE  NEGATIVE Final   MICROSCOPIC NOT DONE ON URINES WITH NEGATIVE PROTEIN, BLOOD, LEUKOCYTES, NITRITE, OR GLUCOSE <1000 mg/dL.  Kaylee Warner Wet Prep HPF POC 04/09/2014 NONE SEEN  NONE SEEN Final  . Trich, Wet Prep 04/09/2014 NONE SEEN  NONE SEEN Final  . Clue Cells Wet Prep HPF POC 04/09/2014 NONE SEEN  NONE SEEN Final  . WBC, Wet Prep HPF POC 04/09/2014 MODERATE* NONE SEEN Final    Dg Hips Bilat With Pelvis Min 5 Views  09/10/2014   CLINICAL DATA:  Bilateral hip and vaginal pain for 1 month since a nerve ablation. Pain is worse on the right. Initial encounter.  EXAM: DG HIP (WITH OR WITHOUT PELVIS) 5+V BILAT  COMPARISON:  None.  FINDINGS: There is no evidence of hip fracture or dislocation. There is no evidence of arthropathy or other focal bone abnormality.  IMPRESSION: Negative exam.   Electronically Signed   By: Inge Rise M.D.   On: 09/10/2014 16:31   Dg Knee Ap/lat W/sunrise Right  09/09/2014   3 views of the right knee  The patient is well centered patella with patellofemoral disease involving  the  medial facet although lateral and medial osteophytes are seen.  The knee is in excessive valgus with osteophytes medially and laterally  joint space narrowing lateral compartment moderate to severe greater than  70%.  Impression valgus osteoarthritis with some patellofemoral arthritis as  well. (14 valgus )    Assessment/Plan   ICD-9-CM ICD-10-CM   1. Pain in joint, pelvic region and thigh, unspecified laterality - improved 719.45 M25.559   2. Lumbar pain with radiation down right leg - stable 724.2 M54.5     Continue current medications as ordered  Follow up with PT as scheduled  Follow up in 8 weeks for routine visit  Sign pain contract  She declined flu shot today  Modjeska S. Perlie Gold  [facility] [address] [place], [place] [address] [phone] Cell (Monday-Friday 8 AM - 5 PM) [phone] After 5 PM and follow prompts

## 2014-10-14 DIAGNOSIS — M545 Low back pain: Secondary | ICD-10-CM | POA: Diagnosis not present

## 2014-10-14 DIAGNOSIS — M5431 Sciatica, right side: Secondary | ICD-10-CM | POA: Diagnosis not present

## 2014-10-29 ENCOUNTER — Other Ambulatory Visit: Payer: Self-pay | Admitting: *Deleted

## 2014-10-29 DIAGNOSIS — M25559 Pain in unspecified hip: Secondary | ICD-10-CM

## 2014-10-29 DIAGNOSIS — M79604 Pain in right leg: Secondary | ICD-10-CM

## 2014-10-29 DIAGNOSIS — M545 Low back pain: Secondary | ICD-10-CM

## 2014-10-29 DIAGNOSIS — M159 Polyosteoarthritis, unspecified: Secondary | ICD-10-CM

## 2014-10-29 MED ORDER — HYDROCODONE-ACETAMINOPHEN 10-325 MG PO TABS: ORAL_TABLET | ORAL | Status: DC

## 2014-10-29 NOTE — Telephone Encounter (Signed)
Patient was a walk in for a refill on her hydrocodone medication.

## 2014-11-26 ENCOUNTER — Ambulatory Visit (INDEPENDENT_AMBULATORY_CARE_PROVIDER_SITE_OTHER): Payer: Medicare Other | Admitting: Internal Medicine

## 2014-11-26 ENCOUNTER — Encounter: Payer: Self-pay | Admitting: Internal Medicine

## 2014-11-26 VITALS — BP 102/76 | HR 103 | Temp 98.2°F | Resp 20 | Ht 68.0 in | Wt 165.8 lb

## 2014-11-26 DIAGNOSIS — M159 Polyosteoarthritis, unspecified: Secondary | ICD-10-CM | POA: Diagnosis not present

## 2014-11-26 DIAGNOSIS — I10 Essential (primary) hypertension: Secondary | ICD-10-CM | POA: Diagnosis not present

## 2014-11-26 DIAGNOSIS — M545 Low back pain: Secondary | ICD-10-CM | POA: Diagnosis not present

## 2014-11-26 DIAGNOSIS — F4329 Adjustment disorder with other symptoms: Secondary | ICD-10-CM

## 2014-11-26 DIAGNOSIS — M79604 Pain in right leg: Secondary | ICD-10-CM

## 2014-11-26 MED ORDER — HYDROCODONE BITARTRATE ER 40 MG PO T24A: 30.0000 | EXTENDED_RELEASE_TABLET | Freq: Every day | ORAL | Status: DC

## 2014-11-26 NOTE — Patient Instructions (Addendum)
START  HYSINGLA for pain daily. May take norco for breakthrough pain for 1 st 3 days  Reduce norvasc to 5mg  daily  Continue other medications as ordered  Follow up in 1 month to reck pain

## 2014-11-26 NOTE — Progress Notes (Signed)
Patient ID: Kaylee Warner, female   DOB: September 12, 1945, 69 y.o.   MRN: [id number]    Location:    [facility]   Place of Service:  OFFICE   Chief Complaint  Patient presents with  . Medical Management of Chronic Issues    8 week follow-up for Pain in joint, pelvic region and thigh, unspecified laterality ,  Lumbar pain with radiation down right leg    HPI:  69 yo female seen today for f/u of chronic pain . She is currently taking norco, meloxicam and tizanidine. She also take cymbalta. No falls. No nee numbness or tingling. She is taking at least 4 tabs norco daily which helps with pain.  HTN - BP running low. 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 Hypertension   . Arthritis     Past Surgical History  Procedure Laterality Date  . Sark 2009      Dr. Jolee Ewing   . Joint replacement  2008    Patient Care Team: Gildardo Cranker, DO as PCP - General (Internal Medicine)  Social History   Social History  . Marital Status: Married    Spouse Name: N/A  . Number of Children: N/A  . Years of Education: N/A   Occupational History  . cook     Social History Main Topics  . Smoking status: Never Smoker   . Smokeless tobacco: Never Used  . Alcohol Use: No  . Drug Use: No  . Sexual Activity: Not on file   Other Topics Concern  . Not on file   Social History Narrative   Diet:Regular   Do you drink/eat things with caffeine? Yes   Marital status:  Married                             What year were you married?    Do you live in a house, apartment, assisted living, condo, trailer, etc)? House   Is it one or more stories? 2   How many persons live in your home? 2  Do you have any pets in your home? No   Current or past profession: Regulatory affairs officer   Do you exercise?   Yes                                                  Type &  how often: stretches   Do you have a living will? No    Do you have a DNR Form?  No   Do you have a POA/HPOA forms? No     reports that she has never smoked. She has never used smokeless tobacco. She reports that she does not drink alcohol or use illicit drugs.  Allergies  Allergen Reactions  . Celecoxib     REACTION: blood pressure went up  . Codeine Itching  . Penicillins Rash    Medications: Patient's Medications  New Prescriptions   No medications on file  Previous Medications   AMLODIPINE (NORVASC) 10 MG TABLET    Take 10 mg by mouth daily.   DULOXETINE (CYMBALTA) 60 MG CAPSULE    Take  60 mg by mouth daily.   HYDROCODONE-ACETAMINOPHEN (NORCO) 10-325 MG TABLET    Take 1 tab q4 - 6hrs prn moderate - severe pain   LISINOPRIL (PRINIVIL,ZESTRIL) 40 MG TABLET    Take 40 mg by mouth daily.    MELOXICAM (MOBIC) 7.5 MG TABLET    Take 1 tablet (7.5 mg total) by mouth daily.   TIZANIDINE (ZANAFLEX) 4 MG TABLET    Take 1 tablet by mouth every 8 hours as needed for muscle spasm  Modified Medications   No medications on file  Discontinued Medications   No medications on file    Review of Systems  Musculoskeletal: Positive for back pain, joint swelling, arthralgias and gait problem.  Psychiatric/Behavioral: Positive for sleep disturbance and dysphoric mood.  All other systems reviewed and are negative.   Filed Vitals:   11/26/14 1542  BP: 102/76  Pulse: 103  Temp: 98.2 F (36.8 C)  TempSrc: Oral  Resp: 20  Height: 5\' 8"  (1.727 m)  Weight: 165 lb 12.8 oz (75.206 kg)  SpO2: 97%   Body mass index is 25.22 kg/(m^2).  Physical Exam  Constitutional: She is oriented to person, place, and time. She appears well-developed and well-nourished. No distress.  Cardiovascular: Regular rhythm and intact distal pulses.  Tachycardia present.  Exam reveals no gallop and no friction rub.   No murmur heard. No LE edema b/l. No calf TTP  Pulmonary/Chest: Effort normal. No respiratory  distress. She has no wheezes. She has no rales.  Musculoskeletal: She exhibits edema and tenderness.  Neurological: She is alert and oriented to person, place, and time.  Skin: Skin is warm and dry. No rash noted.  Psychiatric: She has a normal mood and affect. Her behavior is normal. Judgment and thought content normal.     Labs reviewed: No visits with results within 3 Month(s) from this visit. Latest known visit with results is:  Admission on 04/09/2014, Discharged on 04/09/2014  Component Date Value Ref Range Status  . Color, Urine 04/09/2014 YELLOW  YELLOW Final  . APPearance 04/09/2014 CLEAR  CLEAR Final  . Specific Gravity, Urine 04/09/2014 1.026  1.005 - 1.030 Final  . pH 04/09/2014 6.0  5.0 - 8.0 Final  . Glucose, UA 04/09/2014 NEGATIVE  NEGATIVE mg/dL Final  . Hgb urine dipstick 04/09/2014 NEGATIVE  NEGATIVE Final  . Bilirubin Urine 04/09/2014 NEGATIVE  NEGATIVE Final  . Ketones, ur 04/09/2014 NEGATIVE  NEGATIVE mg/dL Final  . Protein, ur 04/09/2014 NEGATIVE  NEGATIVE mg/dL Final  . Urobilinogen, UA 04/09/2014 0.2  0.0 - 1.0 mg/dL Final  . Nitrite 04/09/2014 NEGATIVE  NEGATIVE Final  . Leukocytes, UA 04/09/2014 NEGATIVE  NEGATIVE Final   MICROSCOPIC NOT DONE ON URINES WITH NEGATIVE PROTEIN, BLOOD, LEUKOCYTES, NITRITE, OR GLUCOSE <1000 mg/dL.  Kaylee Warner Wet Prep HPF POC 04/09/2014 NONE SEEN  NONE SEEN Final  . Trich, Wet Prep 04/09/2014 NONE SEEN  NONE SEEN Final  . Clue Cells Wet Prep HPF POC 04/09/2014 NONE SEEN  NONE SEEN Final  . WBC, Wet Prep HPF POC 04/09/2014 MODERATE* NONE SEEN Final    No results found.   Assessment/Plan   ICD-9-CM ICD-10-CM   1. Essential hypertension, benign - BP low 401.1 I10   2. Lumbar pain with radiation down right leg 724.2 M54.5   3. Generalized OA 715.00 M15.9   4. 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 May take norco for breakthrough pain for 1 st 3 days  Reduce norvasc to 5mg   daily  Continue other medications as ordered  Follow up in 1 month to reck pain  Kaylee Warner  [facility] and [facility] [address] [place], [place] [address] [phone] Cell (Monday-Friday 8 AM - 5 PM) [phone] After 5 PM and follow prompts

## 2014-12-18 ENCOUNTER — Telehealth: Payer: Self-pay

## 2014-12-18 DIAGNOSIS — Z6825 Body mass index (BMI) 25.0-25.9, adult: Secondary | ICD-10-CM | POA: Diagnosis not present

## 2014-12-18 DIAGNOSIS — M461 Sacroiliitis, not elsewhere classified: Secondary | ICD-10-CM | POA: Diagnosis not present

## 2014-12-18 DIAGNOSIS — M25551 Pain in right hip: Secondary | ICD-10-CM | POA: Diagnosis not present

## 2014-12-18 DIAGNOSIS — M47816 Spondylosis without myelopathy or radiculopathy, lumbar region: Secondary | ICD-10-CM | POA: Diagnosis not present

## 2014-12-18 DIAGNOSIS — M4806 Spinal stenosis, lumbar region: Secondary | ICD-10-CM | POA: Diagnosis not present

## 2014-12-18 NOTE — Telephone Encounter (Signed)
I called patient to question when she had her last colonoscopy. Patient not sure, patient had colonoscopy at [facility] in [place]. Patient will call [facility] and she when she had her last one and if it was normal or abnormal.

## 2014-12-23 DIAGNOSIS — M47816 Spondylosis without myelopathy or radiculopathy, lumbar region: Secondary | ICD-10-CM | POA: Diagnosis not present

## 2014-12-23 DIAGNOSIS — M4806 Spinal stenosis, lumbar region: Secondary | ICD-10-CM | POA: Diagnosis not present

## 2014-12-31 ENCOUNTER — Telehealth: Payer: Self-pay

## 2014-12-31 DIAGNOSIS — M545 Low back pain, unspecified: Secondary | ICD-10-CM

## 2014-12-31 DIAGNOSIS — M159 Polyosteoarthritis, unspecified: Secondary | ICD-10-CM

## 2014-12-31 DIAGNOSIS — M79604 Pain in right leg: Secondary | ICD-10-CM

## 2014-12-31 DIAGNOSIS — M25559 Pain in unspecified hip: Secondary | ICD-10-CM

## 2014-12-31 MED ORDER — SERTRALINE HCL 100 MG PO TABS: 100.0000 mg | ORAL_TABLET | Freq: Every day | ORAL | Status: DC

## 2014-12-31 MED ORDER — HYDROCODONE-ACETAMINOPHEN 10-325 MG PO TABS: ORAL_TABLET | ORAL | Status: DC

## 2014-12-31 NOTE — Telephone Encounter (Signed)
1.) Patient called requesting refill on hydrocodone, patient states Hysingla did not work for her. Side note: patient was suppose to switch from hydrocodone to hysingla and follow-up in 1 month.  2.) Patient stopped cymbalta because it was ineffective and switched back to Zoloft 100 mg, patient would like to know if you can give her a RX for Zoloft 100 mg. Please advise  No pending appointment, note made on hydrocodone rx to schedule follow-up

## 2014-12-31 NOTE — Telephone Encounter (Signed)
RX printed for zoloft per patient request, patient will pick-up

## 2014-12-31 NOTE — Telephone Encounter (Signed)
Noted. May send Rx for zoloft to take 1 daily with 4 RF

## 2015-01-30 ENCOUNTER — Encounter: Payer: Self-pay | Admitting: Internal Medicine

## 2015-01-30 ENCOUNTER — Ambulatory Visit (INDEPENDENT_AMBULATORY_CARE_PROVIDER_SITE_OTHER): Payer: Medicare Other | Admitting: Internal Medicine

## 2015-01-30 VITALS — BP 116/72 | HR 95 | Temp 97.9°F | Resp 20 | Ht 68.0 in | Wt 168.6 lb

## 2015-01-30 DIAGNOSIS — M25559 Pain in unspecified hip: Secondary | ICD-10-CM

## 2015-01-30 DIAGNOSIS — M159 Polyosteoarthritis, unspecified: Secondary | ICD-10-CM

## 2015-01-30 DIAGNOSIS — L259 Unspecified contact dermatitis, unspecified cause: Secondary | ICD-10-CM

## 2015-01-30 DIAGNOSIS — J301 Allergic rhinitis due to pollen: Secondary | ICD-10-CM | POA: Diagnosis not present

## 2015-01-30 DIAGNOSIS — M545 Low back pain: Secondary | ICD-10-CM | POA: Diagnosis not present

## 2015-01-30 DIAGNOSIS — M79604 Pain in right leg: Secondary | ICD-10-CM

## 2015-01-30 DIAGNOSIS — F4329 Adjustment disorder with other symptoms: Secondary | ICD-10-CM | POA: Diagnosis not present

## 2015-01-30 MED ORDER — HYDROCODONE-ACETAMINOPHEN 10-325 MG PO TABS: ORAL_TABLET | ORAL | Status: DC

## 2015-01-30 MED ORDER — CITALOPRAM HYDROBROMIDE 20 MG PO TABS: 20.0000 mg | ORAL_TABLET | Freq: Every day | ORAL | Status: DC

## 2015-01-30 NOTE — Progress Notes (Signed)
Patient ID: Kaylee Warner, female   DOB: 01/26/1945, 70 y.o.   MRN: [id number]    Location:    [facility]   Place of Service:  OFFICE   Chief Complaint  Patient presents with  . Medical Management of Chronic Issues    1 month follow-up for Hypertension, patient would like to to check her right ear lobe    HPI:  70 yo female seen today for f/u. She retired from [employer] on December 23rd after 43 years. She feels "like a new person". She did receive an epidural injection prior to her retirement and she feels much better. Joints feel stiff now. Takes 2 pain pills daily. She also takes meloxicam and zanaflex  No HA, dizziness. She has sinus congestion occasionally  Mood - switched from cymbalta to sertraline due to cost. She finds that it does not help her self-esteem.  HTN - stable BP on amlodipine, lisinopril   Past Medical History  Diagnosis Date  . Hypertension   . Arthritis     Past Surgical History  Procedure Laterality Date  . Sark 2009      Dr. Jolee Ewing   . Joint replacement  2008    Patient Care Team: Gildardo Cranker, DO as PCP - General (Internal Medicine)  Social History   Social History  . Marital Status: Married    Spouse Name: N/A  . Number of Children: N/A  . Years of Education: N/A   Occupational History  . cook     Social History Main Topics  . Smoking status: Never Smoker   . Smokeless tobacco: Never Used  . Alcohol Use: No  . Drug Use: No  . Sexual Activity: Not on file   Other Topics Concern  . Not on file   Social History Narrative   Diet:Regular   Do you drink/eat things with caffeine? Yes   Marital status:  Married                             What year were you married?    Do you live in a house, apartment, assisted living, condo, trailer, etc)? House   Is it one or more stories? 2   How many persons live in your home? 2  Do you have any pets in your home? No   Current or past profession: Regulatory affairs officer   Do you exercise?   Yes                                                   Type & how often: stretches   Do you have a living will? No    Do you have a DNR Form?  No   Do you have a POA/HPOA forms? No     reports that she has never smoked. She has never used smokeless tobacco. She reports that she does not drink alcohol or use illicit drugs.  Allergies  Allergen Reactions  . Celecoxib     REACTION: blood pressure went up  . Codeine Itching  . Penicillins Rash    Medications: Patient's Medications  New Prescriptions   No medications on file  Previous Medications   AMLODIPINE (NORVASC) 10 MG TABLET    Take 5 mg by mouth daily.    HYDROCODONE-ACETAMINOPHEN (NORCO) 10-325 MG TABLET  Take 1 tab q4 - 6hrs prn moderate - severe pain   LISINOPRIL (PRINIVIL,ZESTRIL) 40 MG TABLET    Take 40 mg by mouth daily.    MELOXICAM (MOBIC) 7.5 MG TABLET    Take 1 tablet (7.5 mg total) by mouth daily.   SERTRALINE (ZOLOFT) 100 MG TABLET    Take 1 tablet (100 mg total) by mouth daily.   TIZANIDINE (ZANAFLEX) 4 MG TABLET    Take 1 tablet by mouth every 8 hours as needed for muscle spasm  Modified Medications   No medications on file  Discontinued Medications   No medications on file    Review of Systems  HENT: Positive for congestion and ear pain.   Musculoskeletal: Positive for back pain and arthralgias.  Skin: Positive for wound.  Psychiatric/Behavioral: Positive for dysphoric mood.  All other systems reviewed and are negative.   Filed Vitals:   01/30/15 1528  BP: 116/72  Pulse: 95  Temp: 97.9 F (36.6 C)  TempSrc: Oral  Resp: 20  Height: 5\' 8"  (1.727 m)  Weight: 168 lb 9.6 oz (76.476 kg)  SpO2: 95%   Body mass index is 25.64 kg/(m^2).  Physical Exam  Constitutional: She is oriented to person, place, and time. She appears well-developed and well-nourished.  HENT:  Ears:  Mouth/Throat: Oropharynx is clear and moist. No oropharyngeal exudate.  Eyes: Pupils are equal, round, and reactive to light. No  scleral icterus.  Neck: Neck supple. Carotid bruit is not present. No tracheal deviation present. No thyromegaly present.  Cardiovascular: Normal rate, regular rhythm, normal heart sounds and intact distal pulses.  Exam reveals no gallop and no friction rub.   No murmur heard. No LE edema b/l. no calf TTP.   Pulmonary/Chest: Effort normal and breath sounds normal. No stridor. No respiratory distress. She has no wheezes. She has no rales.  Abdominal: Soft. Bowel sounds are normal. She exhibits no distension and no mass. There is no hepatomegaly. There is no tenderness. There is no rebound and no guarding.  Musculoskeletal: She exhibits edema and tenderness.  Lymphadenopathy:    She has no cervical adenopathy.  Neurological: She is alert and oriented to person, place, and time.  Skin: Skin is warm and dry. No rash noted.  Psychiatric: She has a normal mood and affect. Her behavior is normal. Judgment and thought content normal.     Labs reviewed: No visits with results within 3 Month(s) from this visit. Latest known visit with results is:  Admission on 04/09/2014, Discharged on 04/09/2014  Component Date Value Ref Range Status  . Color, Urine 04/09/2014 YELLOW  YELLOW Final  . APPearance 04/09/2014 CLEAR  CLEAR Final  . Specific Gravity, Urine 04/09/2014 1.026  1.005 - 1.030 Final  . pH 04/09/2014 6.0  5.0 - 8.0 Final  . Glucose, UA 04/09/2014 NEGATIVE  NEGATIVE mg/dL Final  . Hgb urine dipstick 04/09/2014 NEGATIVE  NEGATIVE Final  . Bilirubin Urine 04/09/2014 NEGATIVE  NEGATIVE Final  . Ketones, ur 04/09/2014 NEGATIVE  NEGATIVE mg/dL Final  . Protein, ur 04/09/2014 NEGATIVE  NEGATIVE mg/dL Final  . Urobilinogen, UA 04/09/2014 0.2  0.0 - 1.0 mg/dL Final  . Nitrite 04/09/2014 NEGATIVE  NEGATIVE Final  . Leukocytes, UA 04/09/2014 NEGATIVE  NEGATIVE Final   MICROSCOPIC NOT DONE ON URINES WITH NEGATIVE PROTEIN, BLOOD, LEUKOCYTES, NITRITE, OR GLUCOSE <1000 mg/dL.  [name] Wet Prep HPF POC  04/09/2014 NONE SEEN  NONE SEEN Final  . Trich, Wet Prep 04/09/2014 NONE SEEN  NONE SEEN  Final  . Clue Cells Wet Prep HPF POC 04/09/2014 NONE SEEN  NONE SEEN Final  . WBC, Wet Prep HPF POC 04/09/2014 MODERATE* NONE SEEN Final    No results found.   Assessment/Plan   ICD-9-CM ICD-10-CM   1. Pain in joint, pelvic region and thigh, unspecified laterality 719.45 M25.559 HYDROcodone-acetaminophen (NORCO) 10-325 MG tablet  2. Generalized OA 715.00 M15.9 HYDROcodone-acetaminophen (NORCO) 10-325 MG tablet  3. Lumbar pain with radiation down right leg 724.2 M54.5 HYDROcodone-acetaminophen (NORCO) 10-325 MG tablet  4. Allergic rhinitis due to pollen 477.0 J30.1   5. 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 Contact dermatitis 692.9 L25.9    right ear lobe   Continue current medications as ordered  Resume allegra for seasonal allergy  May use OTC hydrocortisone cream for ear lobe rash.  Follow up with back specialist as scheduled  Follow up in 3 mos for routine visit  Lennox Dolberry S. Perlie Gold  [facility] and [facility] Medicine [address] [place], [place] [address] [phone] Cell (Monday-Friday 8 AM - 5 PM) [phone] After 5 PM and follow prompts

## 2015-01-30 NOTE — Patient Instructions (Signed)
Continue current medications as ordered  Resume allegra for seasonal allergy  May use OTC hydrocortisone cream for ear lobe rash  Follow up with back specialist as scheduled  Follow up in 3 mos for routine visit

## 2015-02-10 ENCOUNTER — Telehealth: Payer: Self-pay | Admitting: *Deleted

## 2015-02-10 MED ORDER — AMBULATORY NON FORMULARY MEDICATION: Status: DC

## 2015-02-10 NOTE — Telephone Encounter (Signed)
Patient called and stated that she just saw you. She is still having the Drainage in the back of her throat and it wakes her up at night. Nasty taste, no fever, no cough. Patient is taking her Allegra. Would like to know if there is something else she can do for the discomfort. Please Advise.

## 2015-02-10 NOTE — Telephone Encounter (Signed)
Recommend medrol dosepak #1 take as directed no RF

## 2015-02-10 NOTE — Telephone Encounter (Signed)
Patient notified and faxed to pharmacy.  

## 2015-02-17 ENCOUNTER — Other Ambulatory Visit: Payer: Self-pay | Admitting: Orthopedic Surgery

## 2015-02-25 ENCOUNTER — Other Ambulatory Visit: Payer: Self-pay | Admitting: *Deleted

## 2015-02-25 DIAGNOSIS — M9902 Segmental and somatic dysfunction of thoracic region: Secondary | ICD-10-CM | POA: Diagnosis not present

## 2015-02-25 DIAGNOSIS — M9903 Segmental and somatic dysfunction of lumbar region: Secondary | ICD-10-CM | POA: Diagnosis not present

## 2015-02-25 DIAGNOSIS — M1710 Unilateral primary osteoarthritis, unspecified knee: Secondary | ICD-10-CM

## 2015-02-25 DIAGNOSIS — M179 Osteoarthritis of knee, unspecified: Secondary | ICD-10-CM

## 2015-02-25 DIAGNOSIS — M546 Pain in thoracic spine: Secondary | ICD-10-CM | POA: Diagnosis not present

## 2015-02-25 DIAGNOSIS — M9901 Segmental and somatic dysfunction of cervical region: Secondary | ICD-10-CM | POA: Diagnosis not present

## 2015-02-25 DIAGNOSIS — M47812 Spondylosis without myelopathy or radiculopathy, cervical region: Secondary | ICD-10-CM | POA: Diagnosis not present

## 2015-02-25 MED ORDER — MELOXICAM 7.5 MG PO TABS: 7.5000 mg | ORAL_TABLET | Freq: Every day | ORAL | Status: DC

## 2015-02-27 DIAGNOSIS — M9901 Segmental and somatic dysfunction of cervical region: Secondary | ICD-10-CM | POA: Diagnosis not present

## 2015-02-27 DIAGNOSIS — M47812 Spondylosis without myelopathy or radiculopathy, cervical region: Secondary | ICD-10-CM | POA: Diagnosis not present

## 2015-02-27 DIAGNOSIS — M546 Pain in thoracic spine: Secondary | ICD-10-CM | POA: Diagnosis not present

## 2015-02-27 DIAGNOSIS — M9903 Segmental and somatic dysfunction of lumbar region: Secondary | ICD-10-CM | POA: Diagnosis not present

## 2015-02-27 DIAGNOSIS — M9902 Segmental and somatic dysfunction of thoracic region: Secondary | ICD-10-CM | POA: Diagnosis not present

## 2015-03-12 DIAGNOSIS — H5203 Hypermetropia, bilateral: Secondary | ICD-10-CM | POA: Diagnosis not present

## 2015-03-12 DIAGNOSIS — H524 Presbyopia: Secondary | ICD-10-CM | POA: Diagnosis not present

## 2015-03-12 DIAGNOSIS — H2513 Age-related nuclear cataract, bilateral: Secondary | ICD-10-CM | POA: Diagnosis not present

## 2015-03-12 DIAGNOSIS — H52223 Regular astigmatism, bilateral: Secondary | ICD-10-CM | POA: Diagnosis not present

## 2015-04-08 ENCOUNTER — Telehealth: Payer: Self-pay

## 2015-04-08 DIAGNOSIS — M25559 Pain in unspecified hip: Secondary | ICD-10-CM

## 2015-04-08 DIAGNOSIS — M545 Low back pain: Secondary | ICD-10-CM

## 2015-04-08 DIAGNOSIS — M159 Polyosteoarthritis, unspecified: Secondary | ICD-10-CM

## 2015-04-08 DIAGNOSIS — Z1211 Encounter for screening for malignant neoplasm of colon: Secondary | ICD-10-CM

## 2015-04-08 DIAGNOSIS — M79604 Pain in right leg: Secondary | ICD-10-CM

## 2015-04-08 MED ORDER — HYDROCODONE-ACETAMINOPHEN 10-325 MG PO TABS: ORAL_TABLET | ORAL | Status: DC

## 2015-04-08 NOTE — Telephone Encounter (Signed)
Refill hydrocodone and needs FOBT kit. Patient will pick up on Friday

## 2015-05-01 ENCOUNTER — Encounter: Payer: Self-pay | Admitting: Internal Medicine

## 2015-05-01 ENCOUNTER — Ambulatory Visit (INDEPENDENT_AMBULATORY_CARE_PROVIDER_SITE_OTHER): Payer: Medicare Other | Admitting: Internal Medicine

## 2015-05-01 VITALS — BP 140/82 | HR 85 | Temp 97.7°F | Resp 20 | Ht 68.0 in | Wt 172.4 lb

## 2015-05-01 DIAGNOSIS — F4329 Adjustment disorder with other symptoms: Secondary | ICD-10-CM

## 2015-05-01 DIAGNOSIS — R413 Other amnesia: Secondary | ICD-10-CM | POA: Diagnosis not present

## 2015-05-01 DIAGNOSIS — M79604 Pain in right leg: Secondary | ICD-10-CM

## 2015-05-01 DIAGNOSIS — I10 Essential (primary) hypertension: Secondary | ICD-10-CM

## 2015-05-01 DIAGNOSIS — M549 Dorsalgia, unspecified: Secondary | ICD-10-CM | POA: Diagnosis not present

## 2015-05-01 DIAGNOSIS — Z1211 Encounter for screening for malignant neoplasm of colon: Secondary | ICD-10-CM

## 2015-05-01 DIAGNOSIS — M1710 Unilateral primary osteoarthritis, unspecified knee: Secondary | ICD-10-CM

## 2015-05-01 DIAGNOSIS — M545 Low back pain, unspecified: Secondary | ICD-10-CM

## 2015-05-01 DIAGNOSIS — G8929 Other chronic pain: Secondary | ICD-10-CM | POA: Diagnosis not present

## 2015-05-01 DIAGNOSIS — M179 Osteoarthritis of knee, unspecified: Secondary | ICD-10-CM | POA: Diagnosis not present

## 2015-05-01 MED ORDER — PAROXETINE HCL 20 MG PO TABS: 20.0000 mg | ORAL_TABLET | Freq: Every day | ORAL | Status: DC

## 2015-05-01 MED ORDER — MELOXICAM 7.5 MG PO TABS: 7.5000 mg | ORAL_TABLET | Freq: Every day | ORAL | Status: DC

## 2015-05-01 MED ORDER — DONEPEZIL HCL 5 MG PO TABS: 5.0000 mg | ORAL_TABLET | Freq: Every day | ORAL | Status: DC

## 2015-05-01 NOTE — Patient Instructions (Addendum)
Change citalopram to paroxetine 20mg  daily  Start low dose aricept at bedtime  continue other medications as ordered  Follow up with back specialist as scheduled  Follow up in 1 month for memory loss/pain

## 2015-05-01 NOTE — Progress Notes (Signed)
Location:    [facility]   Place of Service:   OFFICE  Chief Complaint  Patient presents with  . Medical Management of Chronic Issues    MMSE done    HPI:  70 yo female seen today for f/u. She retired from [employer] on December 23rd after 43 years. She feels "like a new person". She did receive an epidural injection prior to her retirement and she was feeling better. Back hurting again and has appt with specialist next week. Joints feel stiff now. Takes 3 pain pills daily. She also takes meloxicam and zanaflex. She needs new rx meloxicam. 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  No HA, dizziness. She has sinus congestion occasionally  Mood - switched from cymbalta to sertraline due to cost. She finds that it does not help her self-esteem. celexa ineffective and would like to try something different  HTN - stable BP on amlodipine, lisinopril  Memory loss - MMSE 28/30. She still c/o short term memory loss. She was taking vayacog rx by previous PCP but too expensive   Past Medical History  Diagnosis Date  . Hypertension   . Arthritis     Past Surgical History  Procedure Laterality Date  . Sark 2009      Dr. Jolee Ewing   . Joint replacement  2008    Patient Care Team: Gildardo Cranker, DO as PCP - General (Internal Medicine)  Social History   Social History  . Marital Status: Married    Spouse Name: N/A  . Number of Children: N/A  . Years of Education: N/A   Occupational History  . cook     Social History Main Topics  . Smoking status: Never Smoker   . Smokeless tobacco: Never Used  . Alcohol Use: No  . Drug Use: No  . Sexual Activity: Not on file   Other Topics Concern  . Not on file   Social History Narrative   Diet:Regular   Do you drink/eat things with caffeine? Yes   Marital status:  Married                             What year were you married?    Do you live in a house, apartment, assisted living, condo, trailer, etc)? House   Is it one or more  stories? 2   How many persons live in your home? 2  Do you have any pets in your home? No   Current or past profession: Regulatory affairs officer   Do you exercise?   Yes                                                  Type & how often: stretches   Do you have a living will? No    Do you have a DNR Form?  No   Do you have a POA/HPOA forms? No     reports that she has never smoked. She has never used smokeless tobacco. She reports that she does not drink alcohol or use illicit drugs.  Allergies  Allergen Reactions  . Celecoxib     REACTION: blood pressure went up  . Codeine Itching  . Penicillins Rash    Medications: Patient's Medications  New Prescriptions   No medications on file  Previous Medications   AMBULATORY NON FORMULARY MEDICATION    Medrol Dosepak #1 Take as Directed   AMLODIPINE (NORVASC) 10 MG TABLET    Take 5 mg by mouth daily.    CITALOPRAM (CELEXA) 20 MG TABLET    Take 1 tablet (20 mg total) by mouth daily.   HYDROCODONE-ACETAMINOPHEN (NORCO) 10-325 MG TABLET    Take 1 tab q4 - 6hrs prn moderate - severe pain   LISINOPRIL (PRINIVIL,ZESTRIL) 40 MG TABLET    Take 40 mg by mouth daily.    MELOXICAM (MOBIC) 7.5 MG TABLET    Take 1 tablet (7.5 mg total) by mouth daily.   TIZANIDINE (ZANAFLEX) 4 MG TABLET    Take 1 tablet by mouth every 8 hours as needed for muscle spasm  Modified Medications   No medications on file  Discontinued Medications   No medications on file    Review of Systems  Unable to perform ROS: Other  memory loss  Filed Vitals:   05/01/15 1358  BP: 140/82  Pulse: 85  Temp: 97.7 F (36.5 C)  TempSrc: Oral  Resp: 20  Height: 5\' 8"  (1.727 m)  Weight: 172 lb 6.4 oz (78.2 kg)  SpO2: 96%   Body mass index is 26.22 kg/(m^2).  Physical Exam  Constitutional: She appears well-developed and well-nourished. No distress.  Neurological: She is alert.  Skin: Skin is warm and dry. No rash noted.  Psychiatric: Her speech is normal and behavior is normal.  Thought content normal. Cognition and memory are impaired. She exhibits a depressed mood.    MMSE - Mini Mental State Exam 05/01/2015  Not completed: (No Data)  Orientation to time 4  Orientation to Place 4  Registration 3  Attention/ Calculation 5  Recall 3  Language- name 2 objects 2  Language- repeat 1  Language- follow 3 step command 3  Language- read & follow direction 1  Write a sentence 1  Copy design 1  Total score 28    Labs reviewed: No visits with results within 3 Month(s) from this visit. Latest known visit with results is:  Admission on 04/09/2014, Discharged on 04/09/2014  Component Date Value Ref Range Status  . Color, Urine 04/09/2014 YELLOW  YELLOW Final  . APPearance 04/09/2014 CLEAR  CLEAR Final  . Specific Gravity, Urine 04/09/2014 1.026  1.005 - 1.030 Final  . pH 04/09/2014 6.0  5.0 - 8.0 Final  . Glucose, UA 04/09/2014 NEGATIVE  NEGATIVE mg/dL Final  . Hgb urine dipstick 04/09/2014 NEGATIVE  NEGATIVE Final  . Bilirubin Urine 04/09/2014 NEGATIVE  NEGATIVE Final  . Ketones, ur 04/09/2014 NEGATIVE  NEGATIVE mg/dL Final  . Protein, ur 04/09/2014 NEGATIVE  NEGATIVE mg/dL Final  . Urobilinogen, UA 04/09/2014 0.2  0.0 - 1.0 mg/dL Final  . Nitrite 04/09/2014 NEGATIVE  NEGATIVE Final  . Leukocytes, UA 04/09/2014 NEGATIVE  NEGATIVE Final   MICROSCOPIC NOT DONE ON URINES WITH NEGATIVE PROTEIN, BLOOD, LEUKOCYTES, NITRITE, OR GLUCOSE <1000 mg/dL.  [name] Wet Prep HPF POC 04/09/2014 NONE SEEN  NONE SEEN Final  . Trich, Wet Prep 04/09/2014 NONE SEEN  NONE SEEN Final  . Clue Cells Wet Prep HPF POC 04/09/2014 NONE SEEN  NONE SEEN Final  . WBC, Wet Prep HPF POC 04/09/2014 MODERATE* NONE SEEN Final    No results found.   Assessment/Plan   ICD-9-CM ICD-10-CM   1. Stress and adjustment reaction 309.89 F43.29 PARoxetine (PAXIL) 20 MG tablet     CMP     CBC with Differential  2. Chronic back pain greater than 3 months duration 724.5 M54.9    338.29 G89.29   3.  Lumbar pain with radiation down right leg 724.2 M54.5   4. Essential hypertension, benign 401.1 I10   5. Memory loss or impairment 780.93 R41.3 donepezil (ARICEPT) 5 MG tablet     CMP     TSH     CBC with Differential  6. Osteoarthritis of knee, unspecified laterality, unspecified osteoarthritis type 715.96 M17.9 meloxicam (MOBIC) 7.5 MG tablet  7. Screening for colon cancer V76.51 Z12.11 Fecal occult blood, imunochemical   Change citalopram to paroxetine 20mg  daily  Start low dose aricept at bedtime  continue other medications as ordered  Follow up with back specialist as scheduled  Follow up in 1 month for memory loss/pain  Sidney Silberman S. Perlie Gold  [facility] [address] [place], [place] [address] [phone] Cell (Monday-Friday 8 AM - 5 PM) [phone] After 5 PM and follow prompts

## 2015-05-02 LAB — CBC WITH DIFFERENTIAL/PLATELET
Basophils Absolute: 0 10*3/uL (ref 0.0–0.2)
Basos: 0 %
EOS (ABSOLUTE): 0.1 10*3/uL (ref 0.0–0.4)
Eos: 3 %
Hematocrit: 38.5 % (ref 34.0–46.6)
Hemoglobin: 12 g/dL (ref 11.1–15.9)
Immature Grans (Abs): 0 10*3/uL (ref 0.0–0.1)
Immature Granulocytes: 0 %
Lymphocytes Absolute: 1.8 10*3/uL (ref 0.7–3.1)
Lymphs: 35 %
MCH: 23.6 pg — ABNORMAL LOW (ref 26.6–33.0)
MCHC: 31.2 g/dL — ABNORMAL LOW (ref 31.5–35.7)
MCV: 76 fL — ABNORMAL LOW (ref 79–97)
Monocytes Absolute: 0.4 10*3/uL (ref 0.1–0.9)
Monocytes: 8 %
Neutrophils Absolute: 2.7 10*3/uL (ref 1.4–7.0)
Neutrophils: 54 %
Platelets: 333 10*3/uL (ref 150–379)
RBC: 5.09 x10E6/uL (ref 3.77–5.28)
RDW: 17.4 % — ABNORMAL HIGH (ref 12.3–15.4)
WBC: 5 10*3/uL (ref 3.4–10.8)

## 2015-05-02 LAB — FECAL OCCULT BLOOD, IMMUNOCHEMICAL: Fecal Occult Bld: NEGATIVE

## 2015-05-02 LAB — COMPREHENSIVE METABOLIC PANEL
ALT: 9 IU/L (ref 0–32)
AST: 15 IU/L (ref 0–40)
Albumin/Globulin Ratio: 1.5 (ref 1.2–2.2)
Albumin: 4 g/dL (ref 3.5–4.8)
Alkaline Phosphatase: 73 IU/L (ref 39–117)
BUN/Creatinine Ratio: 17 (ref 12–28)
BUN: 11 mg/dL (ref 8–27)
Bilirubin Total: 0.5 mg/dL (ref 0.0–1.2)
CO2: 23 mmol/L (ref 18–29)
Calcium: 10 mg/dL (ref 8.7–10.3)
Chloride: 105 mmol/L (ref 96–106)
Creatinine, Ser: 0.65 mg/dL (ref 0.57–1.00)
GFR calc Af Amer: 104 mL/min/{1.73_m2} (ref >59)
GFR calc non Af Amer: 90 mL/min/{1.73_m2} (ref >59)
Globulin, Total: 2.7 g/dL (ref 1.5–4.5)
Glucose: 88 mg/dL (ref 65–99)
Potassium: 4.5 mmol/L (ref 3.5–5.2)
Sodium: 145 mmol/L — ABNORMAL HIGH (ref 134–144)
Total Protein: 6.7 g/dL (ref 6.0–8.5)

## 2015-05-02 LAB — TSH: TSH: 1.26 u[IU]/mL (ref 0.450–4.500)

## 2015-05-04 ENCOUNTER — Encounter: Payer: Self-pay | Admitting: *Deleted

## 2015-05-05 DIAGNOSIS — M4806 Spinal stenosis, lumbar region: Secondary | ICD-10-CM | POA: Diagnosis not present

## 2015-05-05 DIAGNOSIS — I10 Essential (primary) hypertension: Secondary | ICD-10-CM | POA: Diagnosis not present

## 2015-05-27 ENCOUNTER — Telehealth: Payer: Self-pay

## 2015-05-27 MED ORDER — AMLODIPINE BESYLATE 10 MG PO TABS: 5.0000 mg | ORAL_TABLET | Freq: Every day | ORAL | Status: DC

## 2015-05-27 NOTE — Telephone Encounter (Signed)
Patient called stating she needs a refill on one of her b/p medications. Patient stated she thinks its amitriptyline. I reviewed medication list and patient is on Amlodipine. RX sent

## 2015-06-10 ENCOUNTER — Ambulatory Visit: Payer: Medicare Other | Admitting: Internal Medicine

## 2015-07-15 ENCOUNTER — Ambulatory Visit (INDEPENDENT_AMBULATORY_CARE_PROVIDER_SITE_OTHER): Payer: Medicare Other | Admitting: Internal Medicine

## 2015-07-15 ENCOUNTER — Encounter: Payer: Self-pay | Admitting: Internal Medicine

## 2015-07-15 VITALS — BP 142/82 | HR 68 | Temp 98.1°F | Ht 68.0 in | Wt 172.4 lb

## 2015-07-15 DIAGNOSIS — G8929 Other chronic pain: Secondary | ICD-10-CM | POA: Diagnosis not present

## 2015-07-15 DIAGNOSIS — I10 Essential (primary) hypertension: Secondary | ICD-10-CM

## 2015-07-15 DIAGNOSIS — J301 Allergic rhinitis due to pollen: Secondary | ICD-10-CM

## 2015-07-15 DIAGNOSIS — R413 Other amnesia: Secondary | ICD-10-CM

## 2015-07-15 DIAGNOSIS — H6691 Otitis media, unspecified, right ear: Secondary | ICD-10-CM | POA: Diagnosis not present

## 2015-07-15 DIAGNOSIS — M549 Dorsalgia, unspecified: Secondary | ICD-10-CM

## 2015-07-15 MED ORDER — DONEPEZIL HCL 5 MG PO TABS: 5.0000 mg | ORAL_TABLET | Freq: Every day | ORAL | Status: DC

## 2015-07-15 MED ORDER — CEFUROXIME AXETIL 250 MG PO TABS: 250.0000 mg | ORAL_TABLET | Freq: Two times a day (BID) | ORAL | Status: DC

## 2015-07-15 NOTE — Progress Notes (Signed)
Patient ID: Kaylee Warner, female   DOB: 22-Aug-1945, 70 y.o.   MRN: [id number]    Location:  [facility] Place of Service: OFFICE  Chief Complaint  Patient presents with  . Medical Management of Chronic Issues    1 month follow up    HPI:  70 yo female seen today for f/u. 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 She reports increased back pain since resuming work schedule. She has an appt with back specialist for epidural later this month. She has been taking more hydrocodone tabs  She has increased post nasal drip that smells foul. No sore throat. Right ear itching more. No pain. No sinus pressure/pain. No HA or dizziness. She has a cough occasionally productive. No wheezing  Mood - switched from cymbalta to sertraline due to cost. She finds that it does not help her self-esteem. celexa ineffective and would like to try something different  HTN - stable BP on amlodipine, lisinopril  Memory loss - MMSE 28/30. She still c/o short term memory loss. She was taking vayacog rx by previous PCP but too expensive. She stopped her aricept due to c/a out-of-body experience.    Past Medical History  Diagnosis Date  . Hypertension   . Arthritis     Past Surgical History  Procedure Laterality Date  . Sark 2009      Dr. Jolee Ewing   . Joint replacement  2008    Patient Care Team: Gildardo Cranker, DO as PCP - General (Internal Medicine)  Social History   Social History  . Marital Status: Married    Spouse Name: N/A  . Number of Children: N/A  . Years of Education: N/A   Occupational History  . cook     Social History Main Topics  . Smoking status: Never Smoker   . Smokeless tobacco: Never Used  . Alcohol Use: No  . Drug Use: No  . Sexual Activity: Not on file   Other Topics Concern  . Not on file   Social History Narrative   Diet:Regular   Do you drink/eat things with caffeine? Yes   Marital status:  Married                             What year were you  married?    Do you live in a house, apartment, assisted living, condo, trailer, etc)? House   Is it one or more stories? 2   How many persons live in your home? 2  Do you have any pets in your home? No   Current or past profession: Regulatory affairs officer   Do you exercise?   Yes                                                  Type & how often: stretches   Do you have a living will? No    Do you have a DNR Form?  No   Do you have a POA/HPOA forms? No     reports that she has never smoked. She has never used smokeless tobacco. She reports that she does not drink alcohol or use illicit drugs.  Family History  Problem Relation Age of Onset  . Diabetes      family history   .  Arthritis      family history   . Diabetes Sister   . Sjogren's syndrome Daughter   . Diabetes Son   . Hypertension Son   . Diabetes Son    Family Status  Relation Status Death Age  . Sister Alive   . Daughter Alive   . Son Alive   . Son Alive   . Son Alive   . Sister Alive   . Mother Alive   . Father Alive      Allergies  Allergen Reactions  . Celecoxib     REACTION: blood pressure went up  . Codeine Itching  . Penicillins Rash    Medications: Patient's Medications  New Prescriptions   No medications on file  Previous Medications   AMLODIPINE (NORVASC) 10 MG TABLET    Take 0.5 tablets (5 mg total) by mouth daily.   HYDROCODONE-ACETAMINOPHEN (NORCO) 10-325 MG TABLET    Take 1 tab q4 - 6hrs prn moderate - severe pain   LISINOPRIL (PRINIVIL,ZESTRIL) 40 MG TABLET    Take 40 mg by mouth daily.    MELOXICAM (MOBIC) 7.5 MG TABLET    Take 1 tablet (7.5 mg total) by mouth daily.   PAROXETINE (PAXIL) 20 MG TABLET    Take 1 tablet (20 mg total) by mouth daily.   TIZANIDINE (ZANAFLEX) 4 MG TABLET    Take 1 tablet by mouth every 8 hours as needed for muscle spasm  Modified Medications   No medications on file  Discontinued Medications   AMBULATORY NON FORMULARY MEDICATION    Medrol Dosepak #1 Take as  Directed   DONEPEZIL (ARICEPT) 5 MG TABLET    Take 1 tablet (5 mg total) by mouth at bedtime.    Review of Systems  Unable to perform ROS: Other  impaired memory  Filed Vitals:   07/15/15 1529  BP: 142/82  Pulse: 68  Temp: 98.1 F (36.7 C)  TempSrc: Oral  Height: 5\' 8"  (1.727 m)  Weight: 172 lb 6.4 oz (78.2 kg)  SpO2: 91%   Body mass index is 26.22 kg/(m^2).  Physical Exam  Constitutional: She appears well-developed and well-nourished. No distress.  HENT:  Mouth/Throat: No oropharyngeal exudate.  Right TM red and retracted. Left TM appears nml. Nares with red enlarged turbinates. No sinus TTP. Oropharynx with cobblestoning and red but no exudate  Eyes: Pupils are equal, round, and reactive to light. No scleral icterus.  Neck: Neck supple. Carotid bruit is not present. No tracheal deviation present.  Cardiovascular: Normal rate, regular rhythm, normal heart sounds and intact distal pulses.  Exam reveals no gallop and no friction rub.   No murmur heard. No LE edema b/l. no calf TTP.   Pulmonary/Chest: Effort normal. No stridor. No respiratory distress. She has wheezes (with cough). She has no rales.  Abdominal: Soft. Bowel sounds are normal. She exhibits no distension and no mass. There is no hepatomegaly. There is no tenderness. There is no rebound and no guarding.  Musculoskeletal: She exhibits edema and tenderness.  Lymphadenopathy:    She has cervical adenopathy (right proximal cervical).  Neurological: She is alert.  Skin: Skin is warm and dry. No rash noted.  Psychiatric: Her speech is normal and behavior is normal. Thought content normal. Cognition and memory are impaired. She exhibits a depressed mood.     Labs reviewed: Office Visit on 05/01/2015  Component Date Value Ref Range Status  . Glucose 05/01/2015 88  65 - 99 mg/dL Final  . BUN 05/01/2015  11  8 - 27 mg/dL Final  . Creatinine, Ser 05/01/2015 0.65  0.57 - 1.00 mg/dL Final  . GFR calc non Af Amer  05/01/2015 90  >59 mL/min/1.73 Final  . GFR calc Af Amer 05/01/2015 104  >59 mL/min/1.73 Final  . BUN/Creatinine Ratio 05/01/2015 17  12 - 28 Final  . Sodium 05/01/2015 145* 134 - 144 mmol/L Final  . Potassium 05/01/2015 4.5  3.5 - 5.2 mmol/L Final  . Chloride 05/01/2015 105  96 - 106 mmol/L Final  . CO2 05/01/2015 23  18 - 29 mmol/L Final  . Calcium 05/01/2015 10.0  8.7 - 10.3 mg/dL Final  . Total Protein 05/01/2015 6.7  6.0 - 8.5 g/dL Final  . Albumin 05/01/2015 4.0  3.5 - 4.8 g/dL Final  . Globulin, Total 05/01/2015 2.7  1.5 - 4.5 g/dL Final  . Albumin/Globulin Ratio 05/01/2015 1.5  1.2 - 2.2 Final  . Bilirubin Total 05/01/2015 0.5  0.0 - 1.2 mg/dL Final  . Alkaline Phosphatase 05/01/2015 73  39 - 117 IU/L Final  . AST 05/01/2015 15  0 - 40 IU/L Final  . ALT 05/01/2015 9  0 - 32 IU/L Final  . TSH 05/01/2015 1.260  0.450 - 4.500 uIU/mL Final  . WBC 05/01/2015 5.0  3.4 - 10.8 x10E3/uL Final  . RBC 05/01/2015 5.09  3.77 - 5.28 x10E6/uL Final  . Hemoglobin 05/01/2015 12.0  11.1 - 15.9 g/dL Final  . Hematocrit 05/01/2015 38.5  34.0 - 46.6 % Final  . MCV 05/01/2015 76* 79 - 97 fL Final  . MCH 05/01/2015 23.6* 26.6 - 33.0 pg Final  . MCHC 05/01/2015 31.2* 31.5 - 35.7 g/dL Final  . RDW 05/01/2015 17.4* 12.3 - 15.4 % Final  . Platelets 05/01/2015 333  150 - 379 x10E3/uL Final  . Neutrophils 05/01/2015 54   Final  . Lymphs 05/01/2015 35   Final  . Monocytes 05/01/2015 8   Final  . Eos 05/01/2015 3   Final  . Basos 05/01/2015 0   Final  . Neutrophils Absolute 05/01/2015 2.7  1.4 - 7.0 x10E3/uL Final  . Lymphocytes Absolute 05/01/2015 1.8  0.7 - 3.1 x10E3/uL Final  . Monocytes Absolute 05/01/2015 0.4  0.1 - 0.9 x10E3/uL Final  . EOS (ABSOLUTE) 05/01/2015 0.1  0.0 - 0.4 x10E3/uL Final  . Basophils Absolute 05/01/2015 0.0  0.0 - 0.2 x10E3/uL Final  . Immature Granulocytes 05/01/2015 0   Final  . Immature Grans (Abs) 05/01/2015 0.0  0.0 - 0.1 x10E3/uL Final  . Fecal Occult Bld 05/01/2015  Negative  Negative Final    No results found.   Assessment/Plan   ICD-9-CM ICD-10-CM   1. Acute right otitis media, recurrence not specified, unspecified otitis media type 382.9 H66.91 cefUROXime (CEFTIN) 250 MG tablet  2. Memory loss or impairment 780.93 R41.3 donepezil (ARICEPT) 5 MG tablet  3. Chronic back pain greater than 3 months duration 724.5 M54.9    338.29 G89.29   4. Essential hypertension, benign 401.1 I10   5. Seasonal allergic rhinitis due to pollen 477.0 J30.1    Resume aricept  Recommend change allergy pill from allegra to zyrtec or xyzal daily  She notes allergy to PCN only NOT entire penicillin class. She can take amoxicillin without reaction  Continue other medications as ordered  Follow up with back specialist as scheduled  Push fluids and rest  Follow up in 3 mos for routine visi   Rickard Kennerly S. Rae Lips., Fredric Mare  [facility] [address] [place], [place] [address] [phone] Cell (Monday-Friday 8 AM - 5 PM) [phone] After 5 PM and follow prompts

## 2015-07-15 NOTE — Patient Instructions (Addendum)
Recommend change allergy pill from allegra to zyrtec or xyzal daily  Continue other medications as ordered  Follow up with back specialist as scheduled  Push fluids and rest  Follow up in 3 mos for routine visit

## 2015-07-16 ENCOUNTER — Other Ambulatory Visit: Payer: Self-pay | Admitting: *Deleted

## 2015-07-16 DIAGNOSIS — M159 Polyosteoarthritis, unspecified: Secondary | ICD-10-CM

## 2015-07-16 DIAGNOSIS — M545 Low back pain: Secondary | ICD-10-CM

## 2015-07-16 DIAGNOSIS — M25559 Pain in unspecified hip: Secondary | ICD-10-CM

## 2015-07-16 DIAGNOSIS — M79604 Pain in right leg: Secondary | ICD-10-CM

## 2015-07-16 MED ORDER — HYDROCODONE-ACETAMINOPHEN 10-325 MG PO TABS: ORAL_TABLET | ORAL | Status: DC

## 2015-07-16 NOTE — Telephone Encounter (Signed)
Patient called to get her pain medication(hydrocodone) she stated that she forgot to get it at her appointment yesterday. She also stated that she did not received her antiboitic, I informed her that I would call the pharmacy to check on these medications.

## 2015-08-05 DIAGNOSIS — M4806 Spinal stenosis, lumbar region: Secondary | ICD-10-CM | POA: Diagnosis not present

## 2015-08-31 ENCOUNTER — Telehealth: Payer: Self-pay | Admitting: *Deleted

## 2015-08-31 NOTE — Telephone Encounter (Signed)
She needs to resume her lisinopril and take all meds at same time every day. Cramps in legs probably related to uncontrolled BP. If continues, recommend OV +/- UC. Take BP 1 hr after taking meds for more accurate reading and keep log.  F/u as scheduled.

## 2015-08-31 NOTE — Telephone Encounter (Signed)
Patient called and stated that she cannot get her blood pressures under control been running between 155-179/110. Patient has not been keeping a log or saving them. States that she takes it first thing in the morning when she gets up before taking her BP medications. Also stated that she was out of her Lisinopril for awhile and have just started taking it again about a week ago. Also complains about having cramps in her legs at night. Please Advise.

## 2015-09-01 NOTE — Telephone Encounter (Signed)
LMOM to return call.

## 2015-09-01 NOTE — Telephone Encounter (Signed)
Patient notified and agreed.  

## 2015-09-07 ENCOUNTER — Ambulatory Visit (INDEPENDENT_AMBULATORY_CARE_PROVIDER_SITE_OTHER): Payer: Medicare Other | Admitting: Nurse Practitioner

## 2015-09-07 VITALS — BP 152/84 | HR 74 | Temp 98.1°F | Ht 68.0 in | Wt 170.6 lb

## 2015-09-07 DIAGNOSIS — I10 Essential (primary) hypertension: Secondary | ICD-10-CM | POA: Diagnosis not present

## 2015-09-07 DIAGNOSIS — G894 Chronic pain syndrome: Secondary | ICD-10-CM

## 2015-09-07 LAB — BASIC METABOLIC PANEL WITH GFR
BUN: 13 mg/dL (ref 7–25)
CO2: 24 mmol/L (ref 20–31)
Calcium: 9.7 mg/dL (ref 8.6–10.4)
Chloride: 108 mmol/L (ref 98–110)
Creat: 0.64 mg/dL (ref 0.60–0.93)
GFR, Est African American: >89 mL/min (ref >=60)
GFR, Est Non African American: >89 mL/min (ref >=60)
Glucose, Bld: 74 mg/dL (ref 65–99)
Potassium: 4.7 mmol/L (ref 3.5–5.3)
Sodium: 142 mmol/L (ref 135–146)

## 2015-09-07 LAB — CBC WITH DIFFERENTIAL/PLATELET
Basophils Absolute: 0 cells/uL (ref 0–200)
Basophils Relative: 0 %
Eosinophils Absolute: 122 cells/uL (ref 15–500)
Eosinophils Relative: 2 %
HCT: 39.7 % (ref 35.0–45.0)
Hemoglobin: 12.4 g/dL (ref 11.7–15.5)
Lymphocytes Relative: 29 %
Lymphs Abs: 1769 cells/uL (ref 850–3900)
MCH: 23.9 pg — ABNORMAL LOW (ref 27.0–33.0)
MCHC: 31.2 g/dL — ABNORMAL LOW (ref 32.0–36.0)
MCV: 76.6 fL — ABNORMAL LOW (ref 80.0–100.0)
MPV: 10.6 fL (ref 7.5–12.5)
Monocytes Absolute: 305 cells/uL (ref 200–950)
Monocytes Relative: 5 %
Neutro Abs: 3904 cells/uL (ref 1500–7800)
Neutrophils Relative %: 64 %
Platelets: 346 10*3/uL (ref 140–400)
RBC: 5.18 MIL/uL — ABNORMAL HIGH (ref 3.80–5.10)
RDW: 17.2 % — ABNORMAL HIGH (ref 11.0–15.0)
WBC: 6.1 10*3/uL (ref 3.8–10.8)

## 2015-09-07 MED ORDER — TIZANIDINE HCL 4 MG PO TABS: 2.0000 mg | ORAL_TABLET | Freq: Three times a day (TID) | ORAL | 3 refills | Status: DC | PRN

## 2015-09-07 MED ORDER — METOPROLOL TARTRATE 25 MG PO TABS: 25.0000 mg | ORAL_TABLET | Freq: Two times a day (BID) | ORAL | 0 refills | Status: DC

## 2015-09-07 NOTE — Progress Notes (Signed)
Careteam: Patient Care Team: Gildardo Cranker, DO as PCP - General (Internal Medicine)   Allergies  Allergen Reactions  . Celecoxib     REACTION: blood pressure went up  . Codeine Itching  . Penicillins Rash    Chief Complaint  Patient presents with  . Acute Visit    Complains of Leg Cramps and Elevated blood pressure     HPI: Patient is a 70 y.o. female seen in the office today due to elevated blood pressure. Has been taking blood pressure at home. Blood pressure ranging 138-162/80-97 after she takes medication. Taking norvasc 10 mg daily and lisinopril 40 mg daily at 630 in the morning.  Does not eat a lot of processed food or foods high in sodium Compliant with medication.  Starting taking blood pressure when she started having headaches, then realized her blood pressure was running high.   Having increase cramps in foot and legs. Off and on all of her life. Really bad in the last 2 months. Not doing her stretches or exercises as she used to and it has gotten worse.   Declines influenza and pneumonia vaccines today Review of Systems:  Review of Systems  Constitutional: Negative for activity change, appetite change, fatigue and unexpected weight change.  HENT: Negative for congestion and hearing loss.   Eyes: Negative.   Respiratory: Negative for cough and shortness of breath.   Cardiovascular: Negative for chest pain, palpitations and leg swelling.  Gastrointestinal: Negative for abdominal pain, constipation and diarrhea.  Genitourinary: Negative for difficulty urinating and dysuria.  Musculoskeletal: Negative for arthralgias and myalgias.  Skin: Negative for color change and wound.  Neurological: Positive for headaches. Negative for dizziness and weakness.  Psychiatric/Behavioral: Negative for agitation, behavioral problems and confusion.    Past Medical History:  Diagnosis Date  . Arthritis   . Hypertension    Past Surgical History:  Procedure Laterality Date   . JOINT REPLACEMENT  2008  . SARK 2009     Dr. Jolee Ewing    Social History:   reports that she has never smoked. She has never used smokeless tobacco. She reports that she does not drink alcohol or use drugs.  Family History  Problem Relation Age of Onset  . Diabetes      family history   . Arthritis      family history   . Diabetes Sister   . Sjogren's syndrome Daughter   . Diabetes Son   . Hypertension Son   . Diabetes Son     Medications: Patient's Medications  New Prescriptions   No medications on file  Previous Medications   AMLODIPINE (NORVASC) 10 MG TABLET    Take 0.5 tablets (5 mg total) by mouth daily.   DONEPEZIL (ARICEPT) 5 MG TABLET    Take 1 tablet (5 mg total) by mouth at bedtime.   HYDROCODONE-ACETAMINOPHEN (NORCO) 10-325 MG TABLET    Take 1 tab q4 - 6hrs prn moderate - severe pain   LISINOPRIL (PRINIVIL,ZESTRIL) 40 MG TABLET    Take 40 mg by mouth daily.    MELOXICAM (MOBIC) 7.5 MG TABLET    Take 1 tablet (7.5 mg total) by mouth daily.   PAROXETINE (PAXIL) 20 MG TABLET    Take 1 tablet (20 mg total) by mouth daily.   TIZANIDINE (ZANAFLEX) 4 MG TABLET    Take 1 tablet by mouth every 8 hours as needed for muscle spasm  Modified Medications   No medications on file  Discontinued Medications  CEFUROXIME (CEFTIN) 250 MG TABLET    Take 1 tablet (250 mg total) by mouth 2 (two) times daily with a meal.     Physical Exam:  Vitals:   09/07/15 1255  BP: (!) 152/84  Pulse: 74  Temp: 98.1 F (36.7 C)  TempSrc: Oral  Weight: 170 lb 9.6 oz (77.4 kg)  Height: _0  (1.727 m)   Body mass index is 25.94 kg/m.  Physical Exam  Constitutional: She is oriented to person, place, and time. She appears well-developed and well-nourished. No distress.  Cardiovascular: Normal rate, regular rhythm and normal heart sounds.  Exam reveals no gallop and no friction rub.   No murmur heard. Pulmonary/Chest: Effort normal and breath sounds normal. No respiratory distress. She  has no wheezes. She has no rales.  Musculoskeletal: She exhibits no edema.  Neurological: She is alert and oriented to person, place, and time.  Skin: Skin is warm and dry. No rash noted.  Psychiatric: She has a normal mood and affect. Her behavior is normal. Judgment and thought content normal.   Labs reviewed: Basic Metabolic Panel:  Recent Labs  05/01/15 1458  NA 145*  K 4.5  CL 105  CO2 23  GLUCOSE 88  BUN 11  CREATININE 0.65  CALCIUM 10.0  TSH 1.260   Liver Function Tests:  Recent Labs  05/01/15 1458  AST 15  ALT 9  ALKPHOS 73  BILITOT 0.5  PROT 6.7  ALBUMIN 4.0   No results for input(s): LIPASE, AMYLASE in the last 8760 hours. No results for input(s): AMMONIA in the last 8760 hours. CBC:  Recent Labs  05/01/15 1458  WBC 5.0  NEUTROABS 2.7  HCT 38.5  MCV 76*  PLT 333   Lipid Panel: No results for input(s): CHOL, HDL, LDLCALC, TRIG, CHOLHDL, LDLDIRECT in the last 8760 hours. TSH:  Recent Labs  05/01/15 1458  TSH 1.260   A1C: No results found for: HGBA1C   Assessment/Plan 1. Essential hypertension -blood pressure elevated despite medications, dicussed diet compliance.  -will add metoprolol tartrate (LOPRESSOR) 25 MG tablet; Take 1 tablet (25 mg total) by mouth 2 (two) times daily.  Dispense: 60 tablet; Refill: 0 To cont lisinopril and norvasc - BMP with eGFR - CBC with Differential/Platelets  2. Chronic pain syndrome -with increase leg cramps, worse since she has stopped stretching. Encouraged to get back into stretching regimen.  - tiZANidine (ZANAFLEX) 4 MG tablet; Take 0.5-1 tablets (2-4 mg total) by mouth every 8 (eight) hours as needed for muscle spasms. as needed for muscle spasm  Dispense: 60 tablet; Refill: 3 -will follow up labs today  Follow up in 2 weeks on blood pressure   Shericka Johnstone K. Harle Battiest  [facility] [phone] 8 am - 5 pm) [phone] (after hours)

## 2015-09-07 NOTE — Patient Instructions (Addendum)
Cont to write down blood pressure and heart rate after your medication   Low-Sodium Eating Plan Sodium raises blood pressure and causes water to be held in the body. Getting less sodium from food will help lower your blood pressure, reduce any swelling, and protect your heart, liver, and kidneys. We get sodium by adding salt (sodium chloride) to food. Most of our sodium comes from canned, boxed, and frozen foods. Restaurant foods, fast foods, and pizza are also very high in sodium. Even if you take medicine to lower your blood pressure or to reduce fluid in your body, getting less sodium from your food is important. WHAT IS MY PLAN? Most people should limit their sodium intake to 2,300 mg a day.   WHAT DO I NEED TO KNOW ABOUT THIS EATING PLAN? For the low-sodium eating plan, you will follow these general guidelines:  Choose foods with a % Daily Value for sodium of less than 5% (as listed on the food label).   Use salt-free seasonings or herbs instead of table salt or sea salt.   Check with your health care provider or pharmacist before using salt substitutes.   Eat fresh foods.  Eat more vegetables and fruits.  Limit canned vegetables. If you do use them, rinse them well to decrease the sodium.   Limit cheese to 1 oz (28 g) per day.   Eat lower-sodium products, often labeled as "lower sodium" or "no salt added."  Avoid foods that contain monosodium glutamate (MSG). MSG is sometimes added to [place] food and some canned foods.  Check food labels (Nutrition Facts labels) on foods to learn how much sodium is in one serving.  Eat more home-cooked food and less restaurant, buffet, and fast food.  When eating at a restaurant, ask that your food be prepared with less salt, or no salt if possible.  HOW DO I READ FOOD LABELS FOR SODIUM INFORMATION? The Nutrition Facts label lists the amount of sodium in one serving of the food. If you eat more than one serving, you must multiply  the listed amount of sodium by the number of servings. Food labels may also identify foods as:  Sodium free--Less than 5 mg in a serving.  Very low sodium--35 mg or less in a serving.  Low sodium--140 mg or less in a serving.  Light in sodium--50% less sodium in a serving. For example, if a food that usually has 300 mg of sodium is changed to become light in sodium, it will have 150 mg of sodium.  Reduced sodium--25% less sodium in a serving. For example, if a food that usually has 400 mg of sodium is changed to reduced sodium, it will have 300 mg of sodium. WHAT FOODS CAN I EAT? Grains Low-sodium cereals, including oats, puffed wheat and rice, and shredded wheat cereals. Low-sodium crackers. Unsalted rice and pasta. Lower-sodium bread.  Vegetables Frozen or fresh vegetables. Low-sodium or reduced-sodium canned vegetables. Low-sodium or reduced-sodium tomato sauce and paste. Low-sodium or reduced-sodium tomato and vegetable juices.  Fruits Fresh, frozen, and canned fruit. Fruit juice.  Meat and Other Protein Products Low-sodium canned tuna and salmon. Fresh or frozen meat, poultry, seafood, and fish. Lamb. Unsalted nuts. Dried beans, peas, and lentils without added salt. Unsalted canned beans. Homemade soups without salt. Eggs.  Dairy Milk. Soy milk. Ricotta cheese. Low-sodium or reduced-sodium cheeses. Yogurt.  Condiments Fresh and dried herbs and spices. Salt-free seasonings. Onion and garlic powders. Low-sodium varieties of mustard and ketchup. Fresh or refrigerated horseradish. [place]  juice.  Fats and Oils Reduced-sodium salad dressings. Unsalted butter.  Other Unsalted popcorn and pretzels.  The items listed above may not be a complete list of recommended foods or beverages. Contact your dietitian for more options. WHAT FOODS ARE NOT RECOMMENDED? Grains Instant hot cereals. Bread stuffing, pancake, and biscuit mixes. Croutons. Seasoned rice or pasta mixes. Noodle soup  cups. Boxed or frozen macaroni and cheese. Self-rising flour. Regular salted crackers. Vegetables Regular canned vegetables. Regular canned tomato sauce and paste. Regular tomato and vegetable juices. Frozen vegetables in sauces. Salted [place] fries. Olives. [name]. Relishes. Sauerkraut. Salsa. Meat and Other Protein Products Salted, canned, smoked, spiced, or pickled meats, seafood, or fish. Bacon, ham, sausage, hot dogs, corned beef, chipped beef, and packaged luncheon meats. Salt pork. Jerky. Pickled herring. Anchovies, regular canned tuna, and sardines. Salted nuts. Dairy Processed cheese and cheese spreads. Cheese curds. Blue cheese and cottage cheese. Buttermilk.  Condiments Onion and garlic salt, seasoned salt, table salt, and sea salt. Canned and packaged gravies. Worcestershire sauce. Tartar sauce. Barbecue sauce. Teriyaki sauce. Soy sauce, including reduced sodium. Steak sauce. Fish sauce. Oyster sauce. Cocktail sauce. Horseradish that you find on the shelf. Regular ketchup and mustard. Meat flavorings and tenderizers. Bouillon cubes. Hot sauce. Tabasco sauce. Marinades. Taco seasonings. Relishes. Fats and Oils Regular salad dressings. Salted butter. Margarine. Ghee. Bacon fat.  Other Potato and tortilla chips. Corn chips and puffs. Salted popcorn and pretzels. Canned or dried soups. Pizza. Frozen entrees and pot pies.  The items listed above may not be a complete list of foods and beverages to avoid. Contact your dietitian for more information.   This information is not intended to replace advice given to you by your health care provider. Make sure you discuss any questions you have with your health care provider.   Document Released: 06/18/2001 Document Revised: 01/17/2014 Document Reviewed: 10/31/2012 Elsevier Interactive Patient Education [employer].

## 2015-09-24 ENCOUNTER — Encounter: Payer: Self-pay | Admitting: Nurse Practitioner

## 2015-09-24 ENCOUNTER — Ambulatory Visit (INDEPENDENT_AMBULATORY_CARE_PROVIDER_SITE_OTHER): Payer: Medicare Other | Admitting: Nurse Practitioner

## 2015-09-24 ENCOUNTER — Other Ambulatory Visit: Payer: Self-pay | Admitting: Internal Medicine

## 2015-09-24 VITALS — BP 128/82 | HR 62 | Temp 98.0°F | Resp 17 | Ht 68.0 in | Wt 175.6 lb

## 2015-09-24 DIAGNOSIS — Z23 Encounter for immunization: Secondary | ICD-10-CM

## 2015-09-24 DIAGNOSIS — F4329 Adjustment disorder with other symptoms: Secondary | ICD-10-CM

## 2015-09-24 DIAGNOSIS — M79604 Pain in right leg: Secondary | ICD-10-CM

## 2015-09-24 DIAGNOSIS — M545 Low back pain: Secondary | ICD-10-CM

## 2015-09-24 DIAGNOSIS — I10 Essential (primary) hypertension: Secondary | ICD-10-CM

## 2015-09-24 MED ORDER — HYDROCODONE-ACETAMINOPHEN 10-325 MG PO TABS: ORAL_TABLET | ORAL | 0 refills | Status: DC

## 2015-09-24 MED ORDER — METOPROLOL TARTRATE 25 MG PO TABS: 25.0000 mg | ORAL_TABLET | Freq: Two times a day (BID) | ORAL | 2 refills | Status: DC

## 2015-09-24 MED ORDER — LISINOPRIL 40 MG PO TABS: 40.0000 mg | ORAL_TABLET | Freq: Every day | ORAL | 2 refills | Status: DC

## 2015-09-24 MED ORDER — AMLODIPINE BESYLATE 10 MG PO TABS: 5.0000 mg | ORAL_TABLET | Freq: Every day | ORAL | 1 refills | Status: DC

## 2015-09-24 MED ORDER — PAROXETINE HCL 20 MG PO TABS: 20.0000 mg | ORAL_TABLET | Freq: Every day | ORAL | 3 refills | Status: DC

## 2015-09-24 NOTE — Patient Instructions (Signed)
Cont lisinipril, norvasc and metoprolol  Keep follow up with Dr Eulas Post

## 2015-09-24 NOTE — Progress Notes (Signed)
Careteam: Patient Care Team: Gildardo Cranker, DO as PCP - General (Internal Medicine)  Advanced Directive information Does patient have an advance directive?: Yes, Type of Advance Directive: [place];Living will;Out of facility DNR (pink MOST or yellow form)  Allergies  Allergen Reactions  . Celecoxib     REACTION: blood pressure went up  . Codeine Itching  . Penicillins Rash    Chief Complaint  Patient presents with  . Medical Management of Chronic Issues    2 week check up for blood pressure. needs refill on paxil, norco  . Other    Does not want flu vaccine     HPI: Patient is a 70 y.o. female seen in the office today to follow up blood pressure.  Blood pressure was elevated at last visit. Pt was on norvasc and lisinopril. Metoprolol was added.  Pt reports blood pressure is better at home. HR staying in the 60s Denies side effects   Willing to get flu shot Review of Systems:  Review of Systems  Constitutional: Negative for activity change, appetite change, fatigue and unexpected weight change.  HENT: Negative for congestion and hearing loss.   Eyes: Negative.   Respiratory: Negative for cough and shortness of breath.   Cardiovascular: Negative for chest pain, palpitations and leg swelling.  Gastrointestinal: Negative for abdominal pain, constipation and diarrhea.  Genitourinary: Negative for difficulty urinating and dysuria.  Musculoskeletal: Negative for arthralgias and myalgias.  Skin: Negative for color change and wound.  Neurological: Negative for dizziness, weakness and headaches.  Psychiatric/Behavioral: Negative for agitation, behavioral problems and confusion.    Past Medical History:  Diagnosis Date  . Arthritis   . Hypertension    Past Surgical History:  Procedure Laterality Date  . JOINT REPLACEMENT  2008  . SARK 2009     Dr. Jolee Ewing    Social History:   reports that she has never smoked. She has never used smokeless  tobacco. She reports that she does not drink alcohol or use drugs.  Family History  Problem Relation Age of Onset  . Diabetes Sister   . Sjogren's syndrome Daughter   . Diabetes Son   . Hypertension Son   . Diabetes Son   . Diabetes      family history   . Arthritis      family history     Medications: Patient's Medications  New Prescriptions   No medications on file  Previous Medications   AMLODIPINE (NORVASC) 10 MG TABLET    Take 0.5 tablets (5 mg total) by mouth daily.   DONEPEZIL (ARICEPT) 5 MG TABLET    Take 1 tablet (5 mg total) by mouth at bedtime.   HYDROCODONE-ACETAMINOPHEN (NORCO) 10-325 MG TABLET    Take 1 tab q4 - 6hrs prn moderate - severe pain   LISINOPRIL (PRINIVIL,ZESTRIL) 40 MG TABLET    Take 40 mg by mouth daily.    MELOXICAM (MOBIC) 7.5 MG TABLET    Take 1 tablet (7.5 mg total) by mouth daily.   METOPROLOL TARTRATE (LOPRESSOR) 25 MG TABLET    Take 1 tablet (25 mg total) by mouth 2 (two) times daily.   PAROXETINE (PAXIL) 20 MG TABLET    Take 1 tablet (20 mg total) by mouth daily.   TIZANIDINE (ZANAFLEX) 4 MG TABLET    Take 0.5-1 tablets (2-4 mg total) by mouth every 8 (eight) hours as needed for muscle spasms. as needed for muscle spasm  Modified Medications   No medications on  file  Discontinued Medications   No medications on file     Physical Exam:  Vitals:   09/24/15 1319  BP: 128/82  Pulse: 62  Resp: 17  Temp: 98 F (36.7 C)  TempSrc: Oral  SpO2: 95%  Weight: 175 lb 9.6 oz (79.7 kg)  Height: 5\' 8"  (1.727 m)   Body mass index is 26.7 kg/m.  Physical Exam  Constitutional: She is oriented to person, place, and time. She appears well-developed and well-nourished. No distress.  Cardiovascular: Normal rate, regular rhythm and normal heart sounds.  Exam reveals no gallop and no friction rub.   No murmur heard. Pulmonary/Chest: Effort normal and breath sounds normal. No respiratory distress. She has no wheezes. She has no rales.    Musculoskeletal: She exhibits no edema.  Neurological: She is alert and oriented to person, place, and time.  Skin: Skin is warm and dry. No rash noted.  Psychiatric: She has a normal mood and affect. Her behavior is normal. Judgment and thought content normal.    Labs reviewed: Basic Metabolic Panel:  Recent Labs  05/01/15 1458 09/07/15 1343  NA 145* 142  K 4.5 4.7  CL 105 108  CO2 23 24  GLUCOSE 88 74  BUN 11 13  CREATININE 0.65 0.64  CALCIUM 10.0 9.7  TSH 1.260  --    Liver Function Tests:  Recent Labs  05/01/15 1458  AST 15  ALT 9  ALKPHOS 73  BILITOT 0.5  PROT 6.7  ALBUMIN 4.0   No results for input(s): LIPASE, AMYLASE in the last 8760 hours. No results for input(s): AMMONIA in the last 8760 hours. CBC:  Recent Labs  05/01/15 1458 09/07/15 1343  WBC 5.0 6.1  NEUTROABS 2.7 3,904  HGB  --  12.4  HCT 38.5 39.7  MCV 76* 76.6*  PLT 333 346   Lipid Panel: No results for input(s): CHOL, HDL, LDLCALC, TRIG, CHOLHDL, LDLDIRECT in the last 8760 hours. TSH:  Recent Labs  05/01/15 1458  TSH 1.260   A1C: No results found for: HGBA1C   Assessment/Plan 1. Lumbar pain with radiation down right leg Worse while she is working longer hours. Plans to see specialist for injection. - refill provided for HYDROcodone-acetaminophen (NORCO) 10-325 MG tablet; Take 1 tablet every 4 hours as needed moderate - severe pain  Dispense: 120 tablet; Refill: 0  2. Essential hypertension Improved with lopressor, cont medication and lifestyle modifications  - metoprolol tartrate (LOPRESSOR) 25 MG tablet; Take 1 tablet (25 mg total) by mouth 2 (two) times daily.  Dispense: 60 tablet; Refill: 2 - lisinopril (PRINIVIL,ZESTRIL) 40 MG tablet; Take 1 tablet (40 mg total) by mouth daily.  Dispense: 30 tablet; Refill: 2 - amLODipine (NORVASC) 10 MG tablet; Take 0.5 tablets (5 mg total) by mouth daily.  Dispense: 45 tablet; Refill: 1  3. Stress and adjustment reaction - PARoxetine  (PAXIL) 20 MG tablet; Take 1 tablet (20 mg total) by mouth daily.  Dispense: 30 tablet; Refill: 3  4. Encounter for immunization - Flu Vaccine QUAD 36+ mos IM  Jessica K. Harle Battiest  [facility] [phone] 8 am - 5 pm) [phone] (after hours)

## 2015-10-16 ENCOUNTER — Ambulatory Visit: Payer: Medicare Other | Admitting: Internal Medicine

## 2015-10-21 DIAGNOSIS — M5416 Radiculopathy, lumbar region: Secondary | ICD-10-CM | POA: Diagnosis not present

## 2015-10-21 DIAGNOSIS — M48062 Spinal stenosis, lumbar region with neurogenic claudication: Secondary | ICD-10-CM | POA: Diagnosis not present

## 2015-12-16 ENCOUNTER — Encounter: Payer: Self-pay | Admitting: Internal Medicine

## 2015-12-16 ENCOUNTER — Ambulatory Visit (INDEPENDENT_AMBULATORY_CARE_PROVIDER_SITE_OTHER): Payer: Medicare Other

## 2015-12-16 ENCOUNTER — Ambulatory Visit (INDEPENDENT_AMBULATORY_CARE_PROVIDER_SITE_OTHER): Payer: Medicare Other | Admitting: Internal Medicine

## 2015-12-16 VITALS — BP 170/80 | HR 80 | Temp 97.8°F | Ht 68.0 in | Wt 171.2 lb

## 2015-12-16 VITALS — BP 170/80 | HR 80 | Temp 97.8°F | Ht 68.0 in | Wt 171.0 lb

## 2015-12-16 DIAGNOSIS — M25552 Pain in left hip: Secondary | ICD-10-CM | POA: Diagnosis not present

## 2015-12-16 DIAGNOSIS — Z23 Encounter for immunization: Secondary | ICD-10-CM | POA: Diagnosis not present

## 2015-12-16 DIAGNOSIS — I10 Essential (primary) hypertension: Secondary | ICD-10-CM

## 2015-12-16 DIAGNOSIS — R413 Other amnesia: Secondary | ICD-10-CM

## 2015-12-16 DIAGNOSIS — M79604 Pain in right leg: Secondary | ICD-10-CM

## 2015-12-16 DIAGNOSIS — M545 Low back pain: Secondary | ICD-10-CM | POA: Diagnosis not present

## 2015-12-16 DIAGNOSIS — G894 Chronic pain syndrome: Secondary | ICD-10-CM

## 2015-12-16 DIAGNOSIS — Z0000 Encounter for general adult medical examination without abnormal findings: Secondary | ICD-10-CM | POA: Diagnosis not present

## 2015-12-16 DIAGNOSIS — F4329 Adjustment disorder with other symptoms: Secondary | ICD-10-CM

## 2015-12-16 DIAGNOSIS — K1370 Unspecified lesions of oral mucosa: Secondary | ICD-10-CM

## 2015-12-16 LAB — COMPLETE METABOLIC PANEL WITH GFR
ALT: 12 U/L (ref 6–29)
AST: 14 U/L (ref 10–35)
Albumin: 4 g/dL (ref 3.6–5.1)
Alkaline Phosphatase: 53 U/L (ref 33–130)
BUN: 18 mg/dL (ref 7–25)
CO2: 23 mmol/L (ref 20–31)
Calcium: 9.2 mg/dL (ref 8.6–10.4)
Chloride: 108 mmol/L (ref 98–110)
Creat: 0.61 mg/dL (ref 0.60–0.93)
GFR, Est African American: >89 mL/min (ref >=60)
GFR, Est Non African American: >89 mL/min (ref >=60)
Glucose, Bld: 85 mg/dL (ref 65–99)
Potassium: 4 mmol/L (ref 3.5–5.3)
Sodium: 140 mmol/L (ref 135–146)
Total Bilirubin: 0.4 mg/dL (ref 0.2–1.2)
Total Protein: 6.5 g/dL (ref 6.1–8.1)

## 2015-12-16 MED ORDER — HYDROCODONE-ACETAMINOPHEN 10-325 MG PO TABS: ORAL_TABLET | ORAL | 0 refills | Status: DC

## 2015-12-16 MED ORDER — HYDROCHLOROTHIAZIDE 12.5 MG PO CAPS: 12.5000 mg | ORAL_CAPSULE | Freq: Every day | ORAL | 6 refills | Status: DC

## 2015-12-16 NOTE — Patient Instructions (Signed)
START hydrochlorothiazide 1 cap daily  Continue current medications as ordered  GET hip XRAY  Follow up in 2 mos for blood pressure. Will call with lab results

## 2015-12-16 NOTE — Progress Notes (Signed)
Patient ID: Kaylee Warner, female   DOB: 1945/05/23, 70 y.o.   MRN: [id number]    Location:  [facility] Place of Service: OFFICE  Chief Complaint  Patient presents with  . Medical Management of Chronic Issues    3 months routine visit  . Other    MMSE 30/30 passed clock drawing  . Medication Management    Paxil is not working, would like to be back on Zoloft    HPI:  70 yo female seen today for f/u. She is still working and has taken over the [employer]. She reports increased stressors. Pain has been severe in lower back and she is out of pain meds. She is scheduled for epidural injection on Dec 19th with Dr Maryruth Eve.   She has 2 month hx oral ulceration. No relief with gargling warm salt water  She has increased post nasal drip that smells foul. No sore throat. Right ear itching more. No pain. No sinus pressure/pain. No HA or dizziness. She has a cough occasionally productive. No wheezing  Mood - switched from cymbalta to sertraline due to cost. She finds that it does not help her self-esteem. celexa ineffective. She is currently on paxil with some improvement  HTN - uncontrolled on amlodipine, lisinopril, and metoprolol  Memory loss - MMSE 30/30 (previous 28/30). She was taking vayacog rx by previous PCP but too expensive. She takes aricept   Past Medical History:  Diagnosis Date  . Arthritis   . Hypertension     Past Surgical History:  Procedure Laterality Date  . JOINT REPLACEMENT  2008  . SARK 2009     Dr. Jolee Ewing     Patient Care Team: Gildardo Cranker, DO as PCP - General (Internal Medicine)  Social History   Social History  . Marital status: Married    Spouse name: N/A  . Number of children: N/A  . Years of education: N/A   Occupational History  . cook     Social History Main Topics  . Smoking status: Never Smoker  . Smokeless tobacco: Never Used  . Alcohol use 4.2 oz/week    7 Glasses of wine per week     Comment: 1 Glass a day   . Drug use: No  .  Sexual activity: No   Other Topics Concern  . Not on file   Social History Narrative   Diet:Regular   Do you drink/eat things with caffeine? Yes   Marital status:  Married                             What year were you married?    Do you live in a house, apartment, assisted living, condo, trailer, etc)? House   Is it one or more stories? 2   How many persons live in your home? 2  Do you have any pets in your home? No   Current or past profession: Regulatory affairs officer   Do you exercise?   Yes                                                  Type & how often: stretches   Do you have a living will? No    Do you have a DNR Form?  No   Do you have a POA/HPOA  forms? No     reports that she has never smoked. She has never used smokeless tobacco. She reports that she drinks about 4.2 oz of alcohol per week . She reports that she does not use drugs.  Family History  Problem Relation Age of Onset  . Diabetes Sister   . Sjogren's syndrome Daughter   . Diabetes Son   . Hypertension Son   . Diabetes Son   . Diabetes      family history   . Arthritis      family history    Family Status  Relation Status  . Sister Alive  . Daughter Alive  . Son Alive  . Son Alive  . Son Alive  . Sister Alive  . Mother Alive  . Father Alive  .       Allergies  Allergen Reactions  . Celecoxib     REACTION: blood pressure went up  . Codeine Itching  . Penicillins Rash    Medications: Patient's Medications  New Prescriptions   No medications on file  Previous Medications   AMLODIPINE (NORVASC) 10 MG TABLET    Take 0.5 tablets (5 mg total) by mouth daily.   DONEPEZIL (ARICEPT) 5 MG TABLET    Take 1 tablet (5 mg total) by mouth at bedtime.   HYDROCODONE-ACETAMINOPHEN (NORCO) 10-325 MG TABLET    Take 1 tablet every 4 hours as needed moderate - severe pain   LISINOPRIL (PRINIVIL,ZESTRIL) 40 MG TABLET    Take 1 tablet (40 mg total) by mouth daily.   MELOXICAM (MOBIC) 7.5 MG TABLET    Take 1 tablet  (7.5 mg total) by mouth daily.   METOPROLOL TARTRATE (LOPRESSOR) 25 MG TABLET    Take 1 tablet (25 mg total) by mouth 2 (two) times daily.   PAROXETINE (PAXIL) 20 MG TABLET    Take 1 tablet (20 mg total) by mouth daily.   TIZANIDINE (ZANAFLEX) 4 MG TABLET    Take 0.5-1 tablets (2-4 mg total) by mouth every 8 (eight) hours as needed for muscle spasms. as needed for muscle spasm  Modified Medications   No medications on file  Discontinued Medications   No medications on file    Review of Systems  Constitutional: Positive for fatigue.  HENT: Positive for mouth sores (x 2 mos).   Musculoskeletal: Positive for arthralgias, back pain, gait problem and joint swelling.  All other systems reviewed and are negative.   Vitals:   12/16/15 1454  BP: (!) 170/80  Pulse: 80  Temp: 97.8 F (36.6 C)  TempSrc: Oral  SpO2: 97%  Weight: 171 lb (77.6 kg)  Height: _0  (1.727 m)   Body mass index is 26 kg/m.  Physical Exam  Constitutional: She is oriented to person, place, and time. She appears well-developed and well-nourished.  HENT:  Mouth/Throat: Oropharynx is clear and moist. Oral lesions (left buccal) present. No oropharyngeal exudate.    Eyes: Pupils are equal, round, and reactive to light. No scleral icterus.  Neck: Neck supple. Carotid bruit is not present. No tracheal deviation present. No thyromegaly present.  Cardiovascular: Normal rate, regular rhythm, normal heart sounds and intact distal pulses.  Exam reveals no gallop and no friction rub.   No murmur heard. No LE edema b/l. no calf TTP.   Pulmonary/Chest: Effort normal and breath sounds normal. No stridor. No respiratory distress. She has no wheezes. She has no rales.  Abdominal: Soft. Bowel sounds are normal. She exhibits no distension and no mass.  There is no hepatomegaly. There is no tenderness. There is no rebound and no guarding.  Musculoskeletal: She exhibits edema and tenderness.       Back:  Reduced left hip internal  rotation with pain on ROM  Lymphadenopathy:       Head (right side): No submental and no submandibular adenopathy present.       Head (left side): No submental and no submandibular adenopathy present.    She has no cervical adenopathy.  Neurological: She is alert and oriented to person, place, and time. She has normal reflexes. Gait abnormal.  Skin: Skin is warm and dry. No rash noted.  Psychiatric: She has a normal mood and affect. Her behavior is normal. Judgment and thought content normal.     Labs reviewed: No visits with results within 3 Month(s) from this visit.  Latest known visit with results is:  Office Visit on 09/07/2015  Component Date Value Ref Range Status  . Sodium 09/07/2015 142  135 - 146 mmol/L Final  . Potassium 09/07/2015 4.7  3.5 - 5.3 mmol/L Final  . Chloride 09/07/2015 108  98 - 110 mmol/L Final  . CO2 09/07/2015 24  20 - 31 mmol/L Final  . Glucose, Bld 09/07/2015 74  65 - 99 mg/dL Final  . BUN 09/07/2015 13  7 - 25 mg/dL Final  . Creat 09/07/2015 0.64  0.60 - 0.93 mg/dL Final   Comment:   For patients > or = 70 years of age: The upper reference limit for Creatinine is approximately 13% higher for people identified as African-American.     . Calcium 09/07/2015 9.7  8.6 - 10.4 mg/dL Final  . GFR, Est African American 09/07/2015 >89  >=60 mL/min Final  . GFR, Est Non African American 09/07/2015 >89  >=60 mL/min Final  . WBC 09/07/2015 6.1  3.8 - 10.8 K/uL Final  . RBC 09/07/2015 5.18* 3.80 - 5.10 MIL/uL Final  . Hemoglobin 09/07/2015 12.4  11.7 - 15.5 g/dL Final  . HCT 09/07/2015 39.7  35.0 - 45.0 % Final  . MCV 09/07/2015 76.6* 80.0 - 100.0 fL Final  . MCH 09/07/2015 23.9* 27.0 - 33.0 pg Final  . MCHC 09/07/2015 31.2* 32.0 - 36.0 g/dL Final  . RDW 09/07/2015 17.2* 11.0 - 15.0 % Final  . Platelets 09/07/2015 346  140 - 400 K/uL Final  . MPV 09/07/2015 10.6  7.5 - 12.5 fL Final  . Neutro Abs 09/07/2015 3904  1,500 - 7,800 cells/uL Final  . Lymphs Abs  09/07/2015 1769  850 - 3,900 cells/uL Final  . Monocytes Absolute 09/07/2015 305  200 - 950 cells/uL Final  . Eosinophils Absolute 09/07/2015 122  15 - 500 cells/uL Final  . Basophils Absolute 09/07/2015 0  0 - 200 cells/uL Final  . Neutrophils Relative % 09/07/2015 64  % Final  . Lymphocytes Relative 09/07/2015 29  % Final  . Monocytes Relative 09/07/2015 5  % Final  . Eosinophils Relative 09/07/2015 2  % Final  . Basophils Relative 09/07/2015 0  % Final  . Smear Review 09/07/2015 Criteria for review not met   Final    No results found.   Assessment/Plan   ICD-9-CM ICD-10-CM   1. Essential hypertension 401.9 I10 hydrochlorothiazide (MICROZIDE) 12.5 MG capsule     CMP with eGFR  2. Lumbar pain with radiation down right leg 724.2 M54.5 HYDROcodone-acetaminophen (NORCO) 10-325 MG tablet  3. Chronic pain syndrome 338.4 G89.4 CMP with eGFR  4. Memory loss or impairment 780.93 R41.3  CMP with eGFR  5. Stress and adjustment reaction 309.89 F43.29   6. Left hip pain 719.45 M25.552 DG HIP UNILAT W OR W/O PELVIS MIN 4 VIEWS LEFT  7. Oral lesion 528.9 K13.70 Ambulatory referral to ENT   START hydrochlorothiazide 1 cap daily  Continue current medications as ordered  GET hip XRAY  Will call with referral to ENT  Follow up in 2 mos for blood pressure. Will call with lab results    [facility] S. Perlie Gold  [facility] [address] [place], [place] [address] [phone] Cell (Monday-Friday 8 AM - 5 PM) [phone] After 5 PM and follow prompts

## 2015-12-16 NOTE — Progress Notes (Signed)
Subjective:   Kaylee Warner is a 70 y.o. female who presents for an Initial Medicare Annual Wellness Visit.  Review of Systems     Cardiac Risk Factors include: advanced age (>38men, >76 women);hypertension     Objective:    Today's Vitals   12/16/15 1447  BP: (!) 170/80  Pulse: 80  Temp: 97.8 F (36.6 C)  TempSrc: Oral  SpO2: 97%  Weight: 171 lb 3.2 oz (77.7 kg)  Height: 5\' 8"  (1.727 m)  PainSc: 8    Body mass index is 26.03 kg/m.   Current Medications (verified) Outpatient Encounter Prescriptions as of 12/16/2015  Medication Sig  . amLODipine (NORVASC) 10 MG tablet Take 0.5 tablets (5 mg total) by mouth daily.  Marland Kitchen donepezil (ARICEPT) 5 MG tablet Take 1 tablet (5 mg total) by mouth at bedtime.  Marland Kitchen HYDROcodone-acetaminophen (NORCO) 10-325 MG tablet Take 1 tablet every 4 hours as needed moderate - severe pain  . lisinopril (PRINIVIL,ZESTRIL) 40 MG tablet Take 1 tablet (40 mg total) by mouth daily.  . meloxicam (MOBIC) 7.5 MG tablet Take 1 tablet (7.5 mg total) by mouth daily.  . metoprolol tartrate (LOPRESSOR) 25 MG tablet Take 1 tablet (25 mg total) by mouth 2 (two) times daily.  Marland Kitchen PARoxetine (PAXIL) 20 MG tablet Take 1 tablet (20 mg total) by mouth daily.  Marland Kitchen tiZANidine (ZANAFLEX) 4 MG tablet Take 0.5-1 tablets (2-4 mg total) by mouth every 8 (eight) hours as needed for muscle spasms. as needed for muscle spasm   No facility-administered encounter medications on file as of 12/16/2015.     Allergies (verified) Celecoxib; Codeine; and Penicillins   History: Past Medical History:  Diagnosis Date  . Arthritis   . Hypertension    Past Surgical History:  Procedure Laterality Date  . JOINT REPLACEMENT  2008  . SARK 2009     Dr. Jolee Ewing    Family History  Problem Relation Age of Onset  . Diabetes Sister   . Sjogren's syndrome Daughter   . Diabetes Son   . Hypertension Son   . Diabetes Son   . Diabetes      family history   . Arthritis      family history      Social History   Occupational History  . cook     Social History Main Topics  . Smoking status: Never Smoker  . Smokeless tobacco: Never Used  . Alcohol use 4.2 oz/week    7 Glasses of wine per week     Comment: 1 Glass a day   . Drug use: No  . Sexual activity: No    Tobacco Counseling Counseling given: No   Activities of Daily Living In your present state of health, do you have any difficulty performing the following activities: 12/16/2015  Hearing? N  Vision? N  Difficulty concentrating or making decisions? Y  Walking or climbing stairs? N  Dressing or bathing? N  Doing errands, shopping? N  Preparing Food and eating ? N  Using the Toilet? N  In the past six months, have you accidently leaked urine? N  Do you have problems with loss of bowel control? N  Managing your Medications? N  Managing your Finances? N  Housekeeping or managing your Housekeeping? N  Some recent data might be hidden    Immunizations and Health Maintenance Immunization History  Administered Date(s) Administered  . Influenza,inj,Quad PF,36+ Mos 09/24/2015  . Pneumococcal Conjugate-13 12/16/2015   Health Maintenance Due  Topic Date  Due  . Hepatitis C Screening  Nov 06, 1945  . COLONOSCOPY  04/04/1995  . DEXA SCAN  04/04/2010    Patient Care Team: Gildardo Cranker, DO as PCP - General (Internal Medicine)  Indicate any recent Medical Services you may have received from other than [facility] providers in the past year (date may be approximate).     Assessment:   This is a routine wellness examination for Kaylee Warner.  Hearing/Vision screen  Visual Acuity Screening   Right eye Left eye Both eyes  Without correction:     With correction: 20/30-1 20/30 20/30  Comments: Last eye exam done w/ Dr. Mayford Knife at [facility] in [place].   Hearing Screening Comments: Pt has never had a hearing screen done. Denies complications.   Dietary issues and exercise activities discussed: Current Exercise  Habits: Home exercise routine, Type of exercise: treadmill, Time (Minutes): 15, Frequency (Times/Week): 1, Weekly Exercise (Minutes/Week): 15, Intensity: Mild, Exercise limited by: orthopedic condition(s)  Goals    . Reduce fat intake to X grams per day          Starting 12/16/15, I will attempt to decrease my fat intake, (ie. Eating less snacks foods)       Depression Screen PHQ 2/9 Scores 12/16/2015 07/15/2015 06/13/2014  PHQ - 2 Score 1 0 0    Fall Risk Fall Risk  12/16/2015 09/24/2015 07/15/2015 01/30/2015 11/26/2014  Falls in the past year? No Yes Yes Yes No  Number falls in past yr: - 1 1 1  -  Injury with Fall? - - Yes Yes -    Cognitive Function: MMSE - Mini Mental State Exam 05/01/2015  Not completed: (No Data)  Orientation to time 4  Orientation to Place 4  Registration 3  Attention/ Calculation 5  Recall 3  Language- name 2 objects 2  Language- repeat 1  Language- follow 3 step command 3  Language- read & follow direction 1  Write a sentence 1  Copy design 1  Total score 28        Screening Tests Health Maintenance  Topic Date Due  . Hepatitis C Screening  1945/07/08  . COLONOSCOPY  04/04/1995  . DEXA SCAN  04/04/2010  . TETANUS/TDAP  01/09/2017 (Originally 04/03/1964)  . ZOSTAVAX  10/10/2018 (Originally 04/03/2005)  . MAMMOGRAM  04/10/2016  . PNA vac Low Risk Adult (2 of 2 - PPSV23) 12/15/2016  . INFLUENZA VACCINE  Completed      Plan:    I have personally reviewed and addressed the Medicare Annual Wellness questionnaire and have noted the following in the patient's chart:  A. Medical and social history B. Use of alcohol, tobacco or illicit drugs  C. Current medications and supplements D. Functional ability and status E.  Nutritional status F.  Physical activity G. Advance directives H. List of other physicians I.  Hospitalizations, surgeries, and ER visits in previous 12 months J.  [place] to include hearing, vision, cognitive,  depression L. Referrals and appointments - none  In addition, I have reviewed and discussed with patient certain preventive protocols, quality metrics, and best practice recommendations. A written personalized care plan for preventive services as well as general preventive health recommendations were provided to patient.  See attached scanned questionnaire for additional information.   Signed,   Allyn Kenner, LPN Health Advisor   I have reviewed the health advisor's note and was available for consultation. I agree with documentation and plan.   Hedy Garro S. Eulas Post, D. O., F. A. C.  [place]  [facility] [place],  [address] [phone] Cell (Monday-Friday 8 AM - 5 PM) [phone] After 5 PM and follow prompts

## 2015-12-16 NOTE — Progress Notes (Signed)
Quick Notes   Health Maintenance:   Pt states she has had colonoscopy, MMG, and  Dexa done w/in the past 3 yrs. Will work on getting records. Due for Hep C. Pn13 given today.    Abnormal Screen:  None; MMSE-30/30 Passed Clock Test   Patient Concerns:  Pt would like to change her Paxil, states it does not help. Previously on Zoloft, stopped b/c of decreased sexual desire, would like to re-start again.     Nurse Concerns:  None

## 2015-12-16 NOTE — Patient Instructions (Addendum)
Ms. Kraushaar , Thank you for taking time to come for your Medicare Wellness Visit. I appreciate your ongoing commitment to your health goals. Please review the following plan we discussed and let me know if I can assist you in the future.   These are the goals we discussed: Goals    . Reduce fat intake to X grams per day          Starting 12/16/15, I will attempt to decrease my fat intake, (ie. Eating less snacks foods)        This is a list of the screening recommended for you and due dates:  Health Maintenance  Topic Date Due  .  Hepatitis C: One time screening is recommended by Center for Disease Control  (CDC) for  adults born from [birth year] through 1965.   Jul 03, 1945  . Colon Cancer Screening  04/04/1995  . DEXA scan (bone density measurement)  04/04/2010  . Tetanus Vaccine  01/09/2017*  . Shingles Vaccine  10/10/2018*  . Mammogram  04/10/2016  . Pneumonia vaccines (2 of 2 - PPSV23) 12/15/2016  . Flu Shot  Completed  *Topic was postponed. The date shown is not the original due date.  Preventive Care for Adults  A healthy lifestyle and preventive care can promote health and wellness. Preventive health guidelines for adults include the following key practices.  . A routine yearly physical is a good way to check with your health care provider about your health and preventive screening. It is a chance to share any concerns and updates on your health and to receive a thorough exam.  . Visit your dentist for a routine exam and preventive care every 6 months. Brush your teeth twice a day and floss once a day. Good oral hygiene prevents tooth decay and gum disease.  . The frequency of eye exams is based on your age, health, family medical history, use  of contact lenses, and other factors. Follow your health care provider's ecommendations for frequency of eye exams.  . Eat a healthy diet. Foods like vegetables, fruits, whole grains, low-fat dairy products, and lean protein foods contain the  nutrients you need without too many calories. Decrease your intake of foods high in solid fats, added sugars, and salt. Eat the right amount of calories for you. Get information about a proper diet from your health care provider, if necessary.  . Regular physical exercise is one of the most important things you can do for your health. Most adults should get at least 150 minutes of moderate-intensity exercise (any activity that increases your heart rate and causes you to sweat) each week. In addition, most adults need muscle-strengthening exercises on 2 or more days a week.  Silver Sneakers may be a benefit available to you. To determine eligibility, you may visit the website: www.silversneakers.com or contact program at 769-101-0304 Mon-Fri between 8AM-8PM.   . Maintain a healthy weight. The body mass index (BMI) is a screening tool to identify possible weight problems. It provides an estimate of body fat based on height and weight. Your health care provider can find your BMI and can help you achieve or maintain a healthy weight.   For adults 20 years and older: ? A BMI below 18.5 is considered underweight. ? A BMI of 18.5 to 24.9 is normal. ? A BMI of 25 to 29.9 is considered overweight. ? A BMI of 30 and above is considered obese.   . Maintain normal blood lipids and cholesterol levels by exercising and  minimizing your intake of saturated fat. Eat a balanced diet with plenty of fruit and vegetables. Blood tests for lipids and cholesterol should begin at age 44 and be repeated every 5 years. If your lipid or cholesterol levels are high, you are over 50, or you are at high risk for heart disease, you may need your cholesterol levels checked more frequently. Ongoing high lipid and cholesterol levels should be treated with medicines if diet and exercise are not working.  . If you smoke, find out from your health care provider how to quit. If you do not use tobacco, please do not start.  . If you  choose to drink alcohol, please do not consume more than 2 drinks per day. One drink is considered to be 12 ounces (355 mL) of beer, 5 ounces (148 mL) of wine, or 1.5 ounces (44 mL) of liquor.  . If you are 67-71 years old, ask your health care provider if you should take aspirin to prevent strokes.  . Use sunscreen. Apply sunscreen liberally and repeatedly throughout the day. You should seek shade when your shadow is shorter than you. Protect yourself by wearing long sleeves, pants, a wide-brimmed hat, and sunglasses year round, whenever you are outdoors.  . Once a month, do a whole body skin exam, using a mirror to look at the skin on your back. Tell your health care provider of new moles, moles that have irregular borders, moles that are larger than a pencil eraser, or moles that have changed in shape or color.

## 2015-12-25 ENCOUNTER — Other Ambulatory Visit: Payer: Self-pay | Admitting: Pharmacist

## 2015-12-25 NOTE — Patient Outreach (Signed)
Outreach call to [employer] regarding her request for follow up from the [facility] Medication Adherence Campaign. Called and spoke with patient. HIPAA identifiers verified and verbal consent received.  Ms. Lomeli reports that she does not have time to talk right now or in the next week. Let patient know about the reason for my call. Ms. Lagorio states that she would like to talk to me, but asks if she can call me back after the holiday season. Provide patient with my phone number.   Will close pharmacy episode at this time.  Harlow Asa, PharmD, [facility] [phone]

## 2015-12-29 DIAGNOSIS — M5416 Radiculopathy, lumbar region: Secondary | ICD-10-CM | POA: Diagnosis not present

## 2015-12-29 DIAGNOSIS — M48062 Spinal stenosis, lumbar region with neurogenic claudication: Secondary | ICD-10-CM | POA: Diagnosis not present

## 2016-01-18 ENCOUNTER — Other Ambulatory Visit: Payer: Self-pay | Admitting: Nurse Practitioner

## 2016-01-18 DIAGNOSIS — F4329 Adjustment disorder with other symptoms: Secondary | ICD-10-CM

## 2016-02-24 ENCOUNTER — Ambulatory Visit (INDEPENDENT_AMBULATORY_CARE_PROVIDER_SITE_OTHER): Payer: Medicare Other | Admitting: Internal Medicine

## 2016-02-24 ENCOUNTER — Encounter: Payer: Self-pay | Admitting: Internal Medicine

## 2016-02-24 VITALS — BP 130/80 | HR 61 | Temp 97.7°F | Ht 68.0 in | Wt 161.2 lb

## 2016-02-24 DIAGNOSIS — R413 Other amnesia: Secondary | ICD-10-CM

## 2016-02-24 DIAGNOSIS — G894 Chronic pain syndrome: Secondary | ICD-10-CM

## 2016-02-24 DIAGNOSIS — I10 Essential (primary) hypertension: Secondary | ICD-10-CM | POA: Diagnosis not present

## 2016-02-24 DIAGNOSIS — M1710 Unilateral primary osteoarthritis, unspecified knee: Secondary | ICD-10-CM

## 2016-02-24 DIAGNOSIS — M545 Low back pain: Secondary | ICD-10-CM | POA: Diagnosis not present

## 2016-02-24 DIAGNOSIS — M79604 Pain in right leg: Secondary | ICD-10-CM

## 2016-02-24 DIAGNOSIS — K1370 Unspecified lesions of oral mucosa: Secondary | ICD-10-CM | POA: Diagnosis not present

## 2016-02-24 DIAGNOSIS — M179 Osteoarthritis of knee, unspecified: Secondary | ICD-10-CM

## 2016-02-24 MED ORDER — TIZANIDINE HCL 4 MG PO TABS: 2.0000 mg | ORAL_TABLET | Freq: Three times a day (TID) | ORAL | 3 refills | Status: DC | PRN

## 2016-02-24 MED ORDER — DONEPEZIL HCL 5 MG PO TABS: 5.0000 mg | ORAL_TABLET | Freq: Every day | ORAL | 6 refills | Status: DC

## 2016-02-24 MED ORDER — MELOXICAM 7.5 MG PO TABS: 7.5000 mg | ORAL_TABLET | Freq: Every day | ORAL | 5 refills | Status: DC

## 2016-02-24 MED ORDER — HYDROCODONE-ACETAMINOPHEN 10-325 MG PO TABS: ORAL_TABLET | ORAL | 0 refills | Status: DC

## 2016-02-24 MED ORDER — LISINOPRIL 40 MG PO TABS: 40.0000 mg | ORAL_TABLET | Freq: Every day | ORAL | 6 refills | Status: DC

## 2016-02-24 MED ORDER — METOPROLOL TARTRATE 25 MG PO TABS: 25.0000 mg | ORAL_TABLET | Freq: Two times a day (BID) | ORAL | 2 refills | Status: DC

## 2016-02-24 MED ORDER — HYDROCHLOROTHIAZIDE 12.5 MG PO CAPS: 12.5000 mg | ORAL_CAPSULE | Freq: Every day | ORAL | 6 refills | Status: DC

## 2016-02-24 NOTE — Progress Notes (Signed)
Patient ID: Kaylee Warner, female   DOB: January 31, 1945, 71 y.o.   MRN: [id number]    Location:  [facility] Place of Service: OFFICE  Chief Complaint  Patient presents with  . Follow-up    Follow up on B/P    HPI:  71 yo female seen today for f/u. She has painful callus on right 2nd toe that interferes with ambulation. She plans to see podiatry. No other concerns  Chronic pain syndrome - She is still working and has taken over the [employer]. She reports increased stressors. Pain has been severe in lower back and she is out of pain meds. She had an epidural injection on Dec 19th with Dr Maryruth Eve.   She continues to struggle with post nasal drip. She has a cough occasionally productive. No wheezing  Left buccal lesion - she cancelled her appt with ENT due to family death and personal concerns. She has not noticed any change in lesion  Mood - switched from cymbalta to sertraline due to cost. She finds that it does not help her self-esteem. celexa ineffective. She is currently on paxil with improvement  HTN - improved on HCTZ, amlodipine, lisinopril, and metoprolol. She admits that she ran out of meds on yesterday  Memory loss - MMSE 30/30 (previous 28/30). She was taking vayacog rx by previous PCP but too expensive. She takes aricept and feels that is helping her  She is a poor historian due to memory loss. Hx obtained from chart   Past Medical History:  Diagnosis Date  . Arthritis   . Hypertension     Past Surgical History:  Procedure Laterality Date  . JOINT REPLACEMENT  2008  . SARK 2009     Dr. Jolee Ewing     Patient Care Team: Gildardo Cranker, DO as PCP - General (Internal Medicine)  Social History   Social History  . Marital status: Married    Spouse name: N/A  . Number of children: N/A  . Years of education: N/A   Occupational History  . cook     Social History Main Topics  . Smoking status: Never Smoker  . Smokeless tobacco: Never Used  . Alcohol use 4.2  oz/week    7 Glasses of wine per week     Comment: 1 Glass a day   . Drug use: No  . Sexual activity: No   Other Topics Concern  . Not on file   Social History Narrative   Diet:Regular   Do you drink/eat things with caffeine? Yes   Marital status:  Married                             What year were you married?    Do you live in a house, apartment, assisted living, condo, trailer, etc)? House   Is it one or more stories? 2   How many persons live in your home? 2  Do you have any pets in your home? No   Current or past profession: Regulatory affairs officer   Do you exercise?   Yes                                                  Type & how often: stretches   Do you have a living will? No  Do you have a DNR Form?  No   Do you have a POA/HPOA forms? No     reports that she has never smoked. She has never used smokeless tobacco. She reports that she drinks about 4.2 oz of alcohol per week . She reports that she does not use drugs.  Family History  Problem Relation Age of Onset  . Diabetes Sister   . Sjogren's syndrome Daughter   . Diabetes Son   . Hypertension Son   . Diabetes Son   . Diabetes      family history   . Arthritis      family history    Family Status  Relation Status  . Sister Alive  . Daughter Alive  . Son Alive  . Son Alive  . Son Alive  . Sister Alive  . Mother Alive  . Father Alive  .       Allergies  Allergen Reactions  . Celecoxib     REACTION: blood pressure went up  . Codeine Itching  . Penicillins Rash    Medications: Patient's Medications  New Prescriptions   No medications on file  Previous Medications   AMLODIPINE (NORVASC) 10 MG TABLET    Take 0.5 tablets (5 mg total) by mouth daily.   DONEPEZIL (ARICEPT) 5 MG TABLET    Take 1 tablet (5 mg total) by mouth at bedtime.   HYDROCHLOROTHIAZIDE (MICROZIDE) 12.5 MG CAPSULE    Take 1 capsule (12.5 mg total) by mouth daily.   HYDROCODONE-ACETAMINOPHEN (NORCO) 10-325 MG TABLET    Take 1 tablet  every 4 hours as needed moderate - severe pain   LISINOPRIL (PRINIVIL,ZESTRIL) 40 MG TABLET    Take 1 tablet (40 mg total) by mouth daily.   MELOXICAM (MOBIC) 7.5 MG TABLET    Take 1 tablet (7.5 mg total) by mouth daily.   METOPROLOL TARTRATE (LOPRESSOR) 25 MG TABLET    Take 1 tablet (25 mg total) by mouth 2 (two) times daily.   PAROXETINE (PAXIL) 20 MG TABLET    TAKE ONE TABLET BY MOUTH ONCE DAILY   TIZANIDINE (ZANAFLEX) 4 MG TABLET    Take 0.5-1 tablets (2-4 mg total) by mouth every 8 (eight) hours as needed for muscle spasms. as needed for muscle spasm  Modified Medications   No medications on file  Discontinued Medications   No medications on file    Review of Systems  Unable to perform ROS: Dementia    Vitals:   02/24/16 1523  BP: 130/80  Pulse: 61  Temp: 97.7 F (36.5 C)  TempSrc: Oral  SpO2: 96%  Weight: 161 lb 3.2 oz (73.1 kg)  Height: 5\' 8"  (1.727 m)   Body mass index is 24.51 kg/m.  Physical Exam  Constitutional: She is oriented to person, place, and time. She appears well-developed and well-nourished.  HENT:  Mouth/Throat: Oropharynx is clear and moist. Oral lesions (left buccal) present. No oropharyngeal exudate.    Eyes: Pupils are equal, round, and reactive to light. No scleral icterus.  Neck: Neck supple. Carotid bruit is not present. No tracheal deviation present. No thyromegaly present.  Cardiovascular: Normal rate, regular rhythm, normal heart sounds and intact distal pulses.  Exam reveals no gallop and no friction rub.   No murmur heard. No LE edema b/l. no calf TTP.   Pulmonary/Chest: Effort normal and breath sounds normal. No stridor. No respiratory distress. She has no wheezes. She has no rales.  Abdominal: Soft. Bowel sounds are normal. She exhibits  no distension and no mass. There is no hepatomegaly. There is no tenderness. There is no rebound and no guarding.  Musculoskeletal: She exhibits edema and tenderness.       Back:  Reduced left hip  internal rotation with pain on ROM  Lymphadenopathy:       Head (right side): No submental and no submandibular adenopathy present.       Head (left side): No submental and no submandibular adenopathy present.    She has cervical adenopathy (right lateral).  Neurological: She is alert and oriented to person, place, and time. She has normal reflexes. Gait abnormal.  Skin: Skin is warm and dry. No rash noted.  Psychiatric: She has a normal mood and affect. Her behavior is normal. Judgment and thought content normal.     Labs reviewed: Office Visit on 12/16/2015  Component Date Value Ref Range Status  . Sodium 12/16/2015 140  135 - 146 mmol/L Final  . Potassium 12/16/2015 4.0  3.5 - 5.3 mmol/L Final  . Chloride 12/16/2015 108  98 - 110 mmol/L Final  . CO2 12/16/2015 23  20 - 31 mmol/L Final  . Glucose, Bld 12/16/2015 85  65 - 99 mg/dL Final  . BUN 12/16/2015 18  7 - 25 mg/dL Final  . Creat 12/16/2015 0.61  0.60 - 0.93 mg/dL Final   Comment:   For patients > or = 71 years of age: The upper reference limit for Creatinine is approximately 13% higher for people identified as African-American.     . Total Bilirubin 12/16/2015 0.4  0.2 - 1.2 mg/dL Final  . Alkaline Phosphatase 12/16/2015 53  33 - 130 U/L Final  . AST 12/16/2015 14  10 - 35 U/L Final  . ALT 12/16/2015 12  6 - 29 U/L Final  . Total Protein 12/16/2015 6.5  6.1 - 8.1 g/dL Final  . Albumin 12/16/2015 4.0  3.6 - 5.1 g/dL Final  . Calcium 12/16/2015 9.2  8.6 - 10.4 mg/dL Final  . GFR, Est African American 12/16/2015 >89  >=60 mL/min Final  . GFR, Est Non African American 12/16/2015 >89  >=60 mL/min Final    No results found.   Assessment/Plan   ICD-9-CM ICD-10-CM   1. Oral lesion 528.9 K13.70   2. Essential hypertension 401.9 I10 hydrochlorothiazide (MICROZIDE) 12.5 MG capsule     lisinopril (PRINIVIL,ZESTRIL) 40 MG tablet     metoprolol tartrate (LOPRESSOR) 25 MG tablet  3. Memory loss or impairment 780.93 R41.3  donepezil (ARICEPT) 5 MG tablet  4. Lumbar pain with radiation down right leg 724.2 M54.5 HYDROcodone-acetaminophen (NORCO) 10-325 MG tablet  5. Osteoarthritis of knee, unspecified laterality, unspecified osteoarthritis type 715.36 M17.10 meloxicam (MOBIC) 7.5 MG tablet  6. Chronic pain syndrome 338.4 G89.4 tiZANidine (ZANAFLEX) 4 MG tablet   Recommend follow up with ENT for mouth sore  [facility]; Dr. Izora Gala [address] [place], [place] [address] Phone: [phone] Fax: [phone])  Continue current medications as ordered  Follow up with back specialists as scheduled  Follow up in 3 mos for routine visit    Valiant Dills S. Perlie Gold  [facility] [address] [place],  [address] [phone] Cell (Monday-Friday 8 AM - 5 PM) [phone] After 5 PM and follow prompts

## 2016-02-24 NOTE — Patient Instructions (Addendum)
Recommend follow up with ENT for mouth sore  [facility]; Dr. Izora Gala [address] [place], [place] [address] Phone: [phone] Fax: [phone])  Continue current medications as ordered  Follow up with back specialists as scheduled  Follow up in 3 mos for routine visit

## 2016-03-22 ENCOUNTER — Other Ambulatory Visit: Payer: Self-pay | Admitting: Nurse Practitioner

## 2016-03-22 DIAGNOSIS — I10 Essential (primary) hypertension: Secondary | ICD-10-CM

## 2016-04-05 DIAGNOSIS — M5416 Radiculopathy, lumbar region: Secondary | ICD-10-CM | POA: Diagnosis not present

## 2016-04-05 DIAGNOSIS — M48062 Spinal stenosis, lumbar region with neurogenic claudication: Secondary | ICD-10-CM | POA: Diagnosis not present

## 2016-05-12 ENCOUNTER — Telehealth: Payer: Self-pay | Admitting: *Deleted

## 2016-05-12 NOTE — Telephone Encounter (Signed)
Pt calling requesting appt due to blood pressure increased for 2 days, pt states her DIL is a nurse and will tell her what to do. I advised we didn't have any openings or any providers in the office pt scheduled appt with Jessica for 05/16/16.

## 2016-05-16 ENCOUNTER — Encounter: Payer: Self-pay | Admitting: Nurse Practitioner

## 2016-05-16 ENCOUNTER — Ambulatory Visit (INDEPENDENT_AMBULATORY_CARE_PROVIDER_SITE_OTHER): Payer: Medicare Other | Admitting: Nurse Practitioner

## 2016-05-16 VITALS — BP 156/86 | HR 60 | Temp 98.3°F | Resp 18 | Ht 68.0 in | Wt 169.4 lb

## 2016-05-16 DIAGNOSIS — M545 Low back pain: Secondary | ICD-10-CM

## 2016-05-16 DIAGNOSIS — I10 Essential (primary) hypertension: Secondary | ICD-10-CM

## 2016-05-16 DIAGNOSIS — M79604 Pain in right leg: Secondary | ICD-10-CM

## 2016-05-16 LAB — BASIC METABOLIC PANEL WITH GFR
BUN: 16 mg/dL (ref 7–25)
CO2: 28 mmol/L (ref 20–31)
Calcium: 9.3 mg/dL (ref 8.6–10.4)
Chloride: 108 mmol/L (ref 98–110)
Creat: 0.8 mg/dL (ref 0.60–0.93)
GFR, Est African American: 86 mL/min (ref >=60)
GFR, Est Non African American: 74 mL/min (ref >=60)
Glucose, Bld: 99 mg/dL (ref 65–99)
Potassium: 3.7 mmol/L (ref 3.5–5.3)
Sodium: 144 mmol/L (ref 135–146)

## 2016-05-16 LAB — CBC WITH DIFFERENTIAL/PLATELET
Basophils Absolute: 0 cells/uL (ref 0–200)
Basophils Relative: 0 %
Eosinophils Absolute: 134 cells/uL (ref 15–500)
Eosinophils Relative: 2 %
HCT: 37.1 % (ref 35.0–45.0)
Hemoglobin: 11.5 g/dL — ABNORMAL LOW (ref 11.7–15.5)
Lymphocytes Relative: 31 %
Lymphs Abs: 2077 cells/uL (ref 850–3900)
MCH: 24.3 pg — ABNORMAL LOW (ref 27.0–33.0)
MCHC: 31 g/dL — ABNORMAL LOW (ref 32.0–36.0)
MCV: 78.4 fL — ABNORMAL LOW (ref 80.0–100.0)
MPV: 10 fL (ref 7.5–12.5)
Monocytes Absolute: 402 cells/uL (ref 200–950)
Monocytes Relative: 6 %
Neutro Abs: 4087 cells/uL (ref 1500–7800)
Neutrophils Relative %: 61 %
Platelets: 342 10*3/uL (ref 140–400)
RBC: 4.73 MIL/uL (ref 3.80–5.10)
RDW: 16.2 % — ABNORMAL HIGH (ref 11.0–15.0)
WBC: 6.7 10*3/uL (ref 3.8–10.8)

## 2016-05-16 MED ORDER — HYDROCODONE-ACETAMINOPHEN 10-325 MG PO TABS: 1.0000 | ORAL_TABLET | Freq: Four times a day (QID) | ORAL | 0 refills | Status: DC | PRN

## 2016-05-16 NOTE — Patient Instructions (Addendum)
Really work on diet Take blood pressure after you have been sitting for at least 5 mins and you have taken your blood pressure medication Bring log to next OV with Dr Eulas Post   [facility] Eating Plan DASH stands for "Dietary Approaches to Stop Hypertension." The DASH eating plan is a healthy eating plan that has been shown to reduce high blood pressure (hypertension). It may also reduce your risk for type 2 diabetes, heart disease, and stroke. The DASH eating plan may also help with weight loss. What are tips for following this plan? General guidelines   Avoid eating more than 2,300 mg (milligrams) of salt (sodium) a day. If you have hypertension, you may need to reduce your sodium intake to 1,500 mg a day.  Limit alcohol intake to no more than 1 drink a day for nonpregnant women and 2 drinks a day for men. One drink equals 12 oz of beer, 5 oz of wine, or 1 oz of hard liquor.  Work with your health care provider to maintain a healthy body weight or to lose weight. Ask what an ideal weight is for you.  Get at least 30 minutes of exercise that causes your heart to beat faster (aerobic exercise) most days of the week. Activities may include walking, swimming, or biking.  Work with your health care provider or diet and nutrition specialist (dietitian) to adjust your eating plan to your individual calorie needs. Reading food labels   Check food labels for the amount of sodium per serving. Choose foods with less than 5 percent of the Daily Value of sodium. Generally, foods with less than 300 mg of sodium per serving fit into this eating plan.  To find whole grains, look for the word "whole" as the first word in the ingredient list. Shopping   Buy products labeled as "low-sodium" or "no salt added."  Buy fresh foods. Avoid canned foods and premade or frozen meals. Cooking   Avoid adding salt when cooking. Use salt-free seasonings or herbs instead of table salt or sea salt. Check with your health  care provider or pharmacist before using salt substitutes.  Do not fry foods. Cook foods using healthy methods such as baking, boiling, grilling, and broiling instead.  Cook with heart-healthy oils, such as olive, canola, soybean, or sunflower oil. Meal planning    Eat a balanced diet that includes:  5 or more servings of fruits and vegetables each day. At each meal, try to fill half of your plate with fruits and vegetables.  Up to 6-8 servings of whole grains each day.  Less than 6 oz of lean meat, poultry, or fish each day. A 3-oz serving of meat is about the same size as a deck of cards. One egg equals 1 oz.  2 servings of low-fat dairy each day.  A serving of nuts, seeds, or beans 5 times each week.  Heart-healthy fats. Healthy fats called Omega-3 fatty acids are found in foods such as flaxseeds and coldwater fish, like sardines, salmon, and mackerel.  Limit how much you eat of the following:  Canned or prepackaged foods.  Food that is high in trans fat, such as fried foods.  Food that is high in saturated fat, such as fatty meat.  Sweets, desserts, sugary drinks, and other foods with added sugar.  Full-fat dairy products.  Do not salt foods before eating.  Try to eat at least 2 vegetarian meals each week.  Eat more home-cooked food and less restaurant, buffet, and fast  food.  When eating at a restaurant, ask that your food be prepared with less salt or no salt, if possible. What foods are recommended? The items listed may not be a complete list. Talk with your dietitian about what dietary choices are best for you. Grains  Whole-grain or whole-wheat bread. Whole-grain or whole-wheat pasta. Brown rice. [name]. Bulgur. Whole-grain and low-sodium cereals. Pita bread. Low-fat, low-sodium crackers. Whole-wheat flour tortillas. Vegetables  Fresh or frozen vegetables (raw, steamed, roasted, or grilled). Low-sodium or reduced-sodium tomato and vegetable juice.  Low-sodium or reduced-sodium tomato sauce and tomato paste. Low-sodium or reduced-sodium canned vegetables. Fruits  All fresh, dried, or frozen fruit. Canned fruit in natural juice (without added sugar). Meat and other protein foods  Skinless chicken or [place]. Ground chicken or [place]. Pork with fat trimmed off. Fish and seafood. Egg whites. Dried beans, peas, or lentils. Unsalted nuts, nut butters, and seeds. Unsalted canned beans. Lean cuts of beef with fat trimmed off. Low-sodium, lean deli meat. Dairy  Low-fat (1%) or fat-free (skim) milk. Fat-free, low-fat, or reduced-fat cheeses. Nonfat, low-sodium ricotta or cottage cheese. Low-fat or nonfat yogurt. Low-fat, low-sodium cheese. Fats and oils  Soft margarine without trans fats. Vegetable oil. Low-fat, reduced-fat, or light mayonnaise and salad dressings (reduced-sodium). Canola, safflower, olive, soybean, and sunflower oils. Avocado. Seasoning and other foods  Herbs. Spices. Seasoning mixes without salt. Unsalted popcorn and pretzels. Fat-free sweets. What foods are not recommended? The items listed may not be a complete list. Talk with your dietitian about what dietary choices are best for you. Grains  Baked goods made with fat, such as croissants, muffins, or some breads. Dry pasta or rice meal packs. Vegetables  Creamed or fried vegetables. Vegetables in a cheese sauce. Regular canned vegetables (not low-sodium or reduced-sodium). Regular canned tomato sauce and paste (not low-sodium or reduced-sodium). Regular tomato and vegetable juice (not low-sodium or reduced-sodium). [name]. Olives. Fruits  Canned fruit in a light or heavy syrup. Fried fruit. Fruit in cream or butter sauce. Meat and other protein foods  Fatty cuts of meat. Ribs. Fried meat. [name]. Sausage. Bologna and other processed lunch meats. Salami. Fatback. Hotdogs. Bratwurst. Salted nuts and seeds. Canned beans with added salt. Canned or smoked fish. Whole eggs or egg  yolks. Chicken or [place] with skin. Dairy  Whole or 2% milk, cream, and half-and-half. Whole or full-fat cream cheese. Whole-fat or sweetened yogurt. Full-fat cheese. Nondairy creamers. Whipped toppings. Processed cheese and cheese spreads. Fats and oils  Butter. Stick margarine. Lard. Shortening. Ghee. Bacon fat. Tropical oils, such as coconut, palm kernel, or palm oil. Seasoning and other foods  Salted popcorn and pretzels. Onion salt, garlic salt, seasoned salt, table salt, and sea salt. Worcestershire sauce. Tartar sauce. Barbecue sauce. Teriyaki sauce. Soy sauce, including reduced-sodium. Steak sauce. Canned and packaged gravies. Fish sauce. Oyster sauce. Cocktail sauce. Horseradish that you find on the shelf. Ketchup. Mustard. Meat flavorings and tenderizers. Bouillon cubes. Hot sauce and Tabasco sauce. Premade or packaged marinades. Premade or packaged taco seasonings. Relishes. Regular salad dressings. Where to find more information:  National Heart, Lung, and [place]: https://wilson-eaton.com/  American Heart Association: www.heart.org Summary  The DASH eating plan is a healthy eating plan that has been shown to reduce high blood pressure (hypertension). It may also reduce your risk for type 2 diabetes, heart disease, and stroke.  With the DASH eating plan, you should limit salt (sodium) intake to 2,300 mg a day. If you have hypertension, you may need to  reduce your sodium intake to 1,500 mg a day.  When on the DASH eating plan, aim to eat more fresh fruits and vegetables, whole grains, lean proteins, low-fat dairy, and heart-healthy fats.  Work with your health care provider or diet and nutrition specialist (dietitian) to adjust your eating plan to your individual calorie needs. This information is not intended to replace advice given to you by your health care provider. Make sure you discuss any questions you have with your health care provider. Document Released: 12/16/2010 Document  Revised: 12/21/2015 Document Reviewed: 12/21/2015 Elsevier Interactive Patient Education  2017 [employer].

## 2016-05-16 NOTE — Progress Notes (Signed)
Careteam: Patient Care Team: Gildardo Cranker, DO as PCP - General (Internal Medicine)  Advanced Directive information Does Patient Have a Medical Advance Directive?: No  Allergies  Allergen Reactions  . Celecoxib     REACTION: blood pressure went up  . Codeine Itching  . Penicillins Rash    Chief Complaint  Patient presents with  . Acute Visit    Pt is being seen due to increased blood pressure for 1 week. Pt reports taking all medications correctly. Pt states that she has had a strange headache that prompted her to check BP.      HPI: Patient is a 71 y.o. female seen in the office today due to elevated blood pressure.  Reports she was having headaches and decided to take her blood pressure and figured out it was elevated.  Does not like the fact she is on 4 blood pressure medications  Reports her blood pressure today was 154/110.   Pt reports she has been in a lot of pain for the last 2 weeks.  Is not able to sleep well at night.  Reports she does not eat a lot of sodium - -then states she eats fried chicken, fried pork No chest pains, shortness of breath, changes in vision.  Review of Systems:  Review of Systems  Constitutional: Negative for activity change, appetite change, fatigue and unexpected weight change.  HENT: Negative for congestion and hearing loss.   Eyes: Negative.   Respiratory: Negative for cough and shortness of breath.   Cardiovascular: Negative for chest pain, palpitations and leg swelling.  Gastrointestinal: Negative for abdominal pain, constipation and diarrhea.  Genitourinary: Negative for difficulty urinating and dysuria.  Musculoskeletal: Negative for arthralgias and myalgias.  Skin: Negative for color change and wound.  Neurological: Positive for headaches. Negative for dizziness, facial asymmetry, weakness and light-headedness.  Psychiatric/Behavioral: Negative for agitation, behavioral problems and confusion.    Past Medical History:    Diagnosis Date  . Arthritis   . Hypertension    Past Surgical History:  Procedure Laterality Date  . JOINT REPLACEMENT  2008  . SARK 2009     Dr. Jolee Ewing    Social History:   reports that she has never smoked. She has never used smokeless tobacco. She reports that she drinks about 4.2 oz of alcohol per week . She reports that she does not use drugs.  Family History  Problem Relation Age of Onset  . Diabetes Sister   . Sjogren's syndrome Daughter   . Diabetes Son   . Hypertension Son   . Diabetes Son   . Diabetes      family history   . Arthritis      family history     Medications: Patient's Medications  New Prescriptions   No medications on file  Previous Medications   AMLODIPINE (NORVASC) 10 MG TABLET    TAKE ONE-HALF TABLET BY MOUTH ONCE DAILY   DONEPEZIL (ARICEPT) 5 MG TABLET    Take 1 tablet (5 mg total) by mouth at bedtime.   HYDROCHLOROTHIAZIDE (MICROZIDE) 12.5 MG CAPSULE    Take 1 capsule (12.5 mg total) by mouth daily.   HYDROCODONE-ACETAMINOPHEN (NORCO) 10-325 MG TABLET    Take 1 tablet every 4 hours as needed moderate - severe pain   LISINOPRIL (PRINIVIL,ZESTRIL) 40 MG TABLET    Take 1 tablet (40 mg total) by mouth daily.   MELOXICAM (MOBIC) 7.5 MG TABLET    Take 1 tablet (7.5 mg total) by mouth daily.  METOPROLOL TARTRATE (LOPRESSOR) 25 MG TABLET    Take 1 tablet (25 mg total) by mouth 2 (two) times daily.   PAROXETINE (PAXIL) 20 MG TABLET    TAKE ONE TABLET BY MOUTH ONCE DAILY   TIZANIDINE (ZANAFLEX) 4 MG TABLET    Take 0.5-1 tablets (2-4 mg total) by mouth every 8 (eight) hours as needed for muscle spasms. as needed for muscle spasm  Modified Medications   No medications on file  Discontinued Medications   No medications on file     Physical Exam:  Vitals:   05/16/16 1618  BP: (!) 156/86  Pulse: 60  Resp: 18  Temp: 98.3 F (36.8 C)  TempSrc: Oral  SpO2: 98%  Weight: 169 lb 6.4 oz (76.8 kg)  Height: '5\' 8"'  (1.727 m)   Body mass index is  25.76 kg/m.  Physical Exam  Constitutional: She appears well-developed and well-nourished. No distress.  Cardiovascular: Normal rate, regular rhythm and normal heart sounds.  Exam reveals no gallop and no friction rub.   No murmur heard. Pulmonary/Chest: Effort normal and breath sounds normal. No respiratory distress. She has no wheezes. She has no rales.  Musculoskeletal: She exhibits no edema.  Neurological: She is alert.  Skin: Skin is warm and dry. No rash noted.  Psychiatric: She has a normal mood and affect.    Labs reviewed: Basic Metabolic Panel:  Recent Labs  09/07/15 1343 12/16/15 1627  NA 142 140  K 4.7 4.0  CL 108 108  CO2 24 23  GLUCOSE 74 85  BUN 13 18  CREATININE 0.64 0.61  CALCIUM 9.7 9.2   Liver Function Tests:  Recent Labs  12/16/15 1627  AST 14  ALT 12  ALKPHOS 53  BILITOT 0.4  PROT 6.5  ALBUMIN 4.0   No results for input(s): LIPASE, AMYLASE in the last 8760 hours. No results for input(s): AMMONIA in the last 8760 hours. CBC:  Recent Labs  09/07/15 1343  WBC 6.1  NEUTROABS 3,904  HGB 12.4  HCT 39.7  MCV 76.6*  PLT 346   Lipid Panel: No results for input(s): CHOL, HDL, LDLCALC, TRIG, CHOLHDL, LDLDIRECT in the last 8760 hours. TSH: No results for input(s): TSH in the last 8760 hours. A1C: No results found for: HGBA1C   Assessment/Plan 1. Essential hypertension -blood pressure elevated, pt on multiple medication for blood pressure and wants to be taken off medication despite coming in for elevated blood pressure. Reports she fries food often and eats pork and other foods high in sodium. Dash diet given and encouraged to make dietary changes - BMP with eGFR - CBC with Differential/Platelets  2. Lumbar pain with radiation down right leg -refill provided - HYDROcodone-acetaminophen (NORCO) 10-325 MG tablet; Take 1 tablet by mouth every 6 (six) hours as needed. As needed moderate - severe pain  Dispense: 120 tablet; Refill: 0  To  keep follow up with Dr Eulas Post, sooner if needed  [employer] K. Harle Battiest  [facility] [phone] 8 am - 5 pm) [phone] (after hours)

## 2016-05-17 ENCOUNTER — Other Ambulatory Visit: Payer: Self-pay

## 2016-05-17 MED ORDER — FERROUS SULFATE 325 (65 FE) MG PO TABS: 325.0000 mg | ORAL_TABLET | Freq: Every day | ORAL | 3 refills | Status: DC

## 2016-05-17 NOTE — Telephone Encounter (Signed)
Per Janett Billow patient needs to take iron supplement daily.

## 2016-06-07 ENCOUNTER — Other Ambulatory Visit: Payer: Self-pay

## 2016-06-07 NOTE — Patient Outreach (Signed)
[facility]) Care Management  06/07/2016  JAQUEL COOMER Jan 08, 1946 [id number]  Medication Adherence to Mrs. Kourtlynn Gillison,we call Mrs. Loveall she is showing she is past due on her lisinopril 40 mg patient said she is going will pick up from [facility] and she will  get a 90 days supply we call [facility] to verify that she is getting 90 days supply on her lisinopril 40 mg patient will received a 90 days supply from [facility] [phone]  Fax [phone] [email or web address]

## 2016-06-24 ENCOUNTER — Encounter: Payer: Self-pay | Admitting: Internal Medicine

## 2016-06-24 ENCOUNTER — Ambulatory Visit (INDEPENDENT_AMBULATORY_CARE_PROVIDER_SITE_OTHER): Payer: Medicare Other | Admitting: Internal Medicine

## 2016-06-24 VITALS — BP 140/82 | HR 72 | Temp 97.9°F | Ht 68.0 in | Wt 168.8 lb

## 2016-06-24 DIAGNOSIS — R413 Other amnesia: Secondary | ICD-10-CM

## 2016-06-24 DIAGNOSIS — M545 Low back pain, unspecified: Secondary | ICD-10-CM

## 2016-06-24 DIAGNOSIS — I10 Essential (primary) hypertension: Secondary | ICD-10-CM

## 2016-06-24 DIAGNOSIS — D649 Anemia, unspecified: Secondary | ICD-10-CM

## 2016-06-24 DIAGNOSIS — M79604 Pain in right leg: Secondary | ICD-10-CM

## 2016-06-24 DIAGNOSIS — Z1211 Encounter for screening for malignant neoplasm of colon: Secondary | ICD-10-CM | POA: Diagnosis not present

## 2016-06-24 DIAGNOSIS — E2839 Other primary ovarian failure: Secondary | ICD-10-CM

## 2016-06-24 DIAGNOSIS — M75101 Unspecified rotator cuff tear or rupture of right shoulder, not specified as traumatic: Secondary | ICD-10-CM | POA: Diagnosis not present

## 2016-06-24 LAB — IRON: Iron: 96 ug/dL (ref 45–160)

## 2016-06-24 MED ORDER — METOPROLOL-HYDROCHLOROTHIAZIDE 50-25 MG PO TABS: 1.0000 | ORAL_TABLET | Freq: Every day | ORAL | 6 refills | Status: DC

## 2016-06-24 MED ORDER — DONEPEZIL HCL 10 MG PO TABS: 10.0000 mg | ORAL_TABLET | Freq: Every day | ORAL | 6 refills | Status: DC

## 2016-06-24 MED ORDER — HYDROCODONE-ACETAMINOPHEN 10-325 MG PO TABS: 1.0000 | ORAL_TABLET | Freq: Four times a day (QID) | ORAL | 0 refills | Status: DC | PRN

## 2016-06-24 NOTE — Patient Instructions (Addendum)
STOP METOPROLOL AND HYDROCHLOROTHIAZIDE  START METOPROLOL HCT 50/25 MG 1 TAB DAILY  INCREASE ARICEPT (DONEPIZIL) TO 10MG  (2 TABLETS OF 5MG  UNTIL BOTTLE EMPTY THEN GET NEW BOTTLE FORM PHARMACY)  AT BEDTIME  Will Call with bone density and GI referral (colonoscopy)   Will call with lab results  Continue other medications as ordered  May take stool softener for constipation  Follow up in 2 mos for chronic pain, HTN

## 2016-06-24 NOTE — Progress Notes (Signed)
Patient ID: Fredderick Erb, female   DOB: 02-12-1945, 71 y.o.   MRN: [id number]    Location:  [facility] Place of Service: OFFICE  Chief Complaint  Patient presents with  . Medical Management of Chronic Issues    3 month follow-up, discuss why patient is taking 4 B/P medications, memory medication is not helping. + fall risk   . Health Maintenance    Refused Hep C screening and Mammogram. Order placed for BMD and colonoscopy.   . Medication Refill    No refills needed     HPI:  71 yo female seen today for f/u. 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 Less stress involved.    Chronic pain syndrome - She is still working and has taken over the [employer]. Less stress. Pain has been severe in lower back. She had an epidural injection on Dec 19th with Dr Maryruth Eve. She has appt with Dr Maryruth Eve on June 26th  She continues to struggle with post nasal drip. She has a cough occasionally productive. No wheezing  Left buccal lesion - she cancelled her appt with ENT due to family death and personal concerns. She has not noticed any change in lesion. She told her dentist about it and he is treating it.   Mood - switched from cymbalta to sertraline due to cost. She finds that it does not help her self-esteem. celexa ineffective. She is currently on paxil with improvement.  HTN - improved on HCTZ, amlodipine, lisinopril, and metoprolol.  Memory loss - MMSE 30/30 (previous 28/30). She was taking vayacog rx by previous PCP but too expensive. She takes aricept and feels that it is not helping her as much as it was.  She is a poor historian due to memory loss. Hx obtained from chart   Past Medical History:  Diagnosis Date  . Arthritis   . Hypertension     Past Surgical History:  Procedure Laterality Date  . JOINT REPLACEMENT  2008  . SARK 2009     Dr. Jolee Ewing     Patient Care Team: Gildardo Cranker, DO as PCP - General (Internal Medicine)  Social History   Social  History  . Marital status: Married    Spouse name: N/A  . Number of children: N/A  . Years of education: N/A   Occupational History  . cook     Social History Main Topics  . Smoking status: Never Smoker  . Smokeless tobacco: Never Used  . Alcohol use 4.2 oz/week    7 Glasses of wine per week     Comment: 1 Glass a day   . Drug use: No  . Sexual activity: No   Other Topics Concern  . Not on file   Social History Narrative   Diet:Regular   Do you drink/eat things with caffeine? Yes   Marital status:  Married                             What year were you married?    Do you live in a house, apartment, assisted living, condo, trailer, etc)? House   Is it one or more stories? 2   How many persons live in your home? 2  Do you have any pets in your home? No   Current or past profession: Regulatory affairs officer   Do you exercise?   Yes  Type & how often: stretches   Do you have a living will? No    Do you have a DNR Form?  No   Do you have a POA/HPOA forms? No     reports that she has never smoked. She has never used smokeless tobacco. She reports that she drinks about 4.2 oz of alcohol per week . She reports that she does not use drugs.  Family History  Problem Relation Age of Onset  . Diabetes Sister   . Sjogren's syndrome Daughter   . Diabetes Son   . Hypertension Son   . Diabetes Son   . Diabetes Unknown        family history   . Arthritis Unknown        family history    Family Status  Relation Status  . Sister Alive  . Daughter Alive  . Son Alive  . Son Alive  . Son Alive  . Sister Alive  . Mother Alive  . Father Alive  . Unknown (Not Specified)     Allergies  Allergen Reactions  . Celecoxib     REACTION: blood pressure went up  . Codeine Itching  . Penicillins Rash    Medications: Patient's Medications  New Prescriptions   No medications on file  Previous Medications   AMLODIPINE (NORVASC) 10 MG TABLET     TAKE ONE-HALF TABLET BY MOUTH ONCE DAILY   DONEPEZIL (ARICEPT) 5 MG TABLET    Take 1 tablet (5 mg total) by mouth at bedtime.   FERROUS SULFATE 325 (65 FE) MG TABLET    Take 1 tablet (325 mg total) by mouth daily with breakfast.   HYDROCHLOROTHIAZIDE (MICROZIDE) 12.5 MG CAPSULE    Take 1 capsule (12.5 mg total) by mouth daily.   HYDROCODONE-ACETAMINOPHEN (NORCO) 10-325 MG TABLET    Take 1 tablet by mouth every 6 (six) hours as needed. As needed moderate - severe pain   LISINOPRIL (PRINIVIL,ZESTRIL) 40 MG TABLET    Take 1 tablet (40 mg total) by mouth daily.   MELOXICAM (MOBIC) 7.5 MG TABLET    Take 1 tablet (7.5 mg total) by mouth daily.   METOPROLOL TARTRATE (LOPRESSOR) 25 MG TABLET    Take 1 tablet (25 mg total) by mouth 2 (two) times daily.   PAROXETINE (PAXIL) 20 MG TABLET    TAKE ONE TABLET BY MOUTH ONCE DAILY   TIZANIDINE (ZANAFLEX) 4 MG TABLET    Take 0.5-1 tablets (2-4 mg total) by mouth every 8 (eight) hours as needed for muscle spasms. as needed for muscle spasm  Modified Medications   No medications on file  Discontinued Medications   No medications on file    Review of Systems  Vitals:   06/24/16 1506  BP: 140/82  Pulse: 72  Temp: 97.9 F (36.6 C)  TempSrc: Oral  SpO2: 97%  Weight: 168 lb 12.8 oz (76.6 kg)  Height: _0  (1.727 m)   Body mass index is 25.67 kg/m.  Physical Exam  Constitutional: She is oriented to person, place, and time. She appears well-developed and well-nourished.  HENT:  Mouth/Throat: Oropharynx is clear and moist. Oral lesions (left buccal) present. No oropharyngeal exudate.    Eyes: Pupils are equal, round, and reactive to light. No scleral icterus.  Neck: Neck supple. Carotid bruit is not present. No tracheal deviation present. No thyromegaly present.  Cardiovascular: Normal rate, regular rhythm, normal heart sounds and intact distal pulses.  Exam reveals no gallop and no friction rub.  No murmur heard. No LE edema b/l. no calf TTP.     Pulmonary/Chest: Effort normal and breath sounds normal. No stridor. No respiratory distress. She has no wheezes. She has no rales.  Abdominal: Soft. Bowel sounds are normal. She exhibits no distension and no mass. There is no hepatomegaly. There is no tenderness. There is no rebound and no guarding.  Musculoskeletal: She exhibits edema and tenderness.       Back:  Reduced left hip internal rotation with pain on ROM  Lymphadenopathy:       Head (right side): No submental and no submandibular adenopathy present.       Head (left side): No submental and no submandibular adenopathy present.    She has cervical adenopathy (right lateral).  Neurological: She is alert and oriented to person, place, and time. She has normal reflexes. Gait abnormal.  Skin: Skin is warm and dry. No rash noted.  Psychiatric: She has a normal mood and affect. Her behavior is normal. Judgment and thought content normal.     Labs reviewed: Office Visit on 05/16/2016  Component Date Value Ref Range Status  . Sodium 05/16/2016 144  135 - 146 mmol/L Final  . Potassium 05/16/2016 3.7  3.5 - 5.3 mmol/L Final  . Chloride 05/16/2016 108  98 - 110 mmol/L Final  . CO2 05/16/2016 28  20 - 31 mmol/L Final  . Glucose, Bld 05/16/2016 99  65 - 99 mg/dL Final  . BUN 05/16/2016 16  7 - 25 mg/dL Final  . Creat 05/16/2016 0.80  0.60 - 0.93 mg/dL Final   Comment:   For patients > or = 71 years of age: The upper reference limit for Creatinine is approximately 13% higher for people identified as African-American.     . Calcium 05/16/2016 9.3  8.6 - 10.4 mg/dL Final  . GFR, Est African American 05/16/2016 86  >=60 mL/min Final  . GFR, Est Non African American 05/16/2016 74  >=60 mL/min Final  . WBC 05/16/2016 6.7  3.8 - 10.8 K/uL Final  . RBC 05/16/2016 4.73  3.80 - 5.10 MIL/uL Final  . Hemoglobin 05/16/2016 11.5* 11.7 - 15.5 g/dL Final  . HCT 05/16/2016 37.1  35.0 - 45.0 % Final  . MCV 05/16/2016 78.4* 80.0 - 100.0 fL Final   . MCH 05/16/2016 24.3* 27.0 - 33.0 pg Final  . MCHC 05/16/2016 31.0* 32.0 - 36.0 g/dL Final  . RDW 05/16/2016 16.2* 11.0 - 15.0 % Final  . Platelets 05/16/2016 342  140 - 400 K/uL Final  . MPV 05/16/2016 10.0  7.5 - 12.5 fL Final  . Neutro Abs 05/16/2016 4087  1,500 - 7,800 cells/uL Final  . Lymphs Abs 05/16/2016 2077  850 - 3,900 cells/uL Final  . Monocytes Absolute 05/16/2016 402  200 - 950 cells/uL Final  . Eosinophils Absolute 05/16/2016 134  15 - 500 cells/uL Final  . Basophils Absolute 05/16/2016 0  0 - 200 cells/uL Final  . Neutrophils Relative % 05/16/2016 61  % Final  . Lymphocytes Relative 05/16/2016 31  % Final  . Monocytes Relative 05/16/2016 6  % Final  . Eosinophils Relative 05/16/2016 2  % Final  . Basophils Relative 05/16/2016 0  % Final  . Smear Review 05/16/2016 Criteria for review not met   Final    No results found.   Assessment/Plan   ICD-10-CM   1. Memory loss or impairment R41.3 donepezil (ARICEPT) 10 MG tablet  2. Colon cancer screening Z12.11 Ambulatory referral to Gastroenterology  3. Essential hypertension I10 metoprolol-hydrochlorothiazide (LOPRESSOR HCT) 50-25 MG tablet  4. Rotator cuff syndrome of right shoulder M75.101   5. Estrogen deficiency E28.39 DG Bone Density  6. Lumbar pain with radiation down right leg M54.5 HYDROcodone-acetaminophen (NORCO) 10-325 MG tablet  7. Anemia, unspecified type D64.9 Ferritin    Iron   STOP METOPROLOL AND HYDROCHLOROTHIAZIDE  START METOPROLOL HCT 50/25 MG 1 TAB DAILY  INCREASE ARICEPT (DONEPIZIL) TO 10MG (2 TABLETS OF 5MG UNTIL BOTTLE EMPTY THEN GET NEW BOTTLE FORM PHARMACY)  AT BEDTIME  Will Call with bone density and GI referral (colonoscopy)   Will call with lab results  Continue other medications as ordered  May take stool softener for constipation  Follow up in 2 mos for chronic pain, HTN   Matthan Sledge S. Perlie Gold  [facility] and Adult Medicine [address] [place], [place] [address] [phone] Cell (Monday-Friday 8 AM - 5 PM) [phone] After 5 PM and follow prompts

## 2016-06-25 LAB — FERRITIN: Ferritin: 37 ng/mL (ref 20–288)

## 2016-07-01 DIAGNOSIS — H52223 Regular astigmatism, bilateral: Secondary | ICD-10-CM | POA: Diagnosis not present

## 2016-07-01 DIAGNOSIS — H2513 Age-related nuclear cataract, bilateral: Secondary | ICD-10-CM | POA: Diagnosis not present

## 2016-07-01 DIAGNOSIS — H524 Presbyopia: Secondary | ICD-10-CM | POA: Diagnosis not present

## 2016-07-01 DIAGNOSIS — H5203 Hypermetropia, bilateral: Secondary | ICD-10-CM | POA: Diagnosis not present

## 2016-07-04 DIAGNOSIS — M5416 Radiculopathy, lumbar region: Secondary | ICD-10-CM | POA: Diagnosis not present

## 2016-07-04 DIAGNOSIS — M48062 Spinal stenosis, lumbar region with neurogenic claudication: Secondary | ICD-10-CM | POA: Diagnosis not present

## 2016-07-15 ENCOUNTER — Other Ambulatory Visit: Payer: Self-pay | Admitting: Nurse Practitioner

## 2016-07-15 DIAGNOSIS — F4329 Adjustment disorder with other symptoms: Secondary | ICD-10-CM

## 2016-07-20 DIAGNOSIS — I10 Essential (primary) hypertension: Secondary | ICD-10-CM | POA: Diagnosis not present

## 2016-07-20 DIAGNOSIS — M48062 Spinal stenosis, lumbar region with neurogenic claudication: Secondary | ICD-10-CM | POA: Diagnosis not present

## 2016-07-20 DIAGNOSIS — M5416 Radiculopathy, lumbar region: Secondary | ICD-10-CM | POA: Diagnosis not present

## 2016-08-10 ENCOUNTER — Telehealth: Payer: Self-pay | Admitting: *Deleted

## 2016-08-10 DIAGNOSIS — Z1211 Encounter for screening for malignant neoplasm of colon: Secondary | ICD-10-CM

## 2016-08-10 NOTE — Telephone Encounter (Signed)
She can be referred to GI for screening colonoscopy and they will decide when she is scheduled

## 2016-08-10 NOTE — Telephone Encounter (Signed)
Order placed

## 2016-08-10 NOTE — Telephone Encounter (Signed)
Patient called requesting a referral for a screening Colonoscopy. Patient stated that her last one was back in 2002. Patient wants to go to [facility] for it on a Monday. Please Advise.

## 2016-08-17 ENCOUNTER — Ambulatory Visit
Admission: RE | Admit: 2016-08-17 | Discharge: 2016-08-17 | Disposition: A | Discharge status: Home / Self Care | Payer: Medicare Other | Source: Ambulatory Visit | Attending: Internal Medicine | Admitting: Internal Medicine

## 2016-08-17 DIAGNOSIS — Z780 Asymptomatic menopausal state: Secondary | ICD-10-CM | POA: Diagnosis not present

## 2016-08-17 DIAGNOSIS — Z13820 Encounter for screening for osteoporosis: Secondary | ICD-10-CM | POA: Diagnosis not present

## 2016-08-17 DIAGNOSIS — E2839 Other primary ovarian failure: Secondary | ICD-10-CM

## 2016-08-18 ENCOUNTER — Encounter: Payer: Self-pay | Admitting: Gastroenterology

## 2016-08-18 ENCOUNTER — Telehealth: Payer: Self-pay | Admitting: *Deleted

## 2016-08-18 NOTE — Telephone Encounter (Signed)
Patient called and left message on Clinical Intake line stating that she wanted to get a Mammogram.   I called patient back and left message stating that she can call and schedule her mammogram at her convenience that she did not need a referral.

## 2016-08-22 ENCOUNTER — Other Ambulatory Visit: Payer: Self-pay

## 2016-08-22 DIAGNOSIS — Z1239 Encounter for other screening for malignant neoplasm of breast: Secondary | ICD-10-CM

## 2016-08-30 ENCOUNTER — Encounter: Payer: Self-pay | Admitting: Internal Medicine

## 2016-08-30 ENCOUNTER — Ambulatory Visit (INDEPENDENT_AMBULATORY_CARE_PROVIDER_SITE_OTHER): Payer: Medicare Other | Admitting: Internal Medicine

## 2016-08-30 VITALS — BP 142/74 | HR 53 | Temp 98.3°F | Resp 18 | Ht 68.0 in | Wt 170.0 lb

## 2016-08-30 DIAGNOSIS — J301 Allergic rhinitis due to pollen: Secondary | ICD-10-CM | POA: Diagnosis not present

## 2016-08-30 DIAGNOSIS — M545 Low back pain: Secondary | ICD-10-CM

## 2016-08-30 DIAGNOSIS — I10 Essential (primary) hypertension: Secondary | ICD-10-CM

## 2016-08-30 DIAGNOSIS — G894 Chronic pain syndrome: Secondary | ICD-10-CM

## 2016-08-30 DIAGNOSIS — M79604 Pain in right leg: Secondary | ICD-10-CM

## 2016-08-30 MED ORDER — TIZANIDINE HCL 4 MG PO TABS: 2.0000 mg | ORAL_TABLET | Freq: Three times a day (TID) | ORAL | 3 refills | Status: DC | PRN

## 2016-08-30 MED ORDER — METOPROLOL-HYDROCHLOROTHIAZIDE 50-25 MG PO TABS: 1.0000 | ORAL_TABLET | Freq: Every day | ORAL | 1 refills | Status: DC

## 2016-08-30 MED ORDER — HYDROCODONE-ACETAMINOPHEN 10-325 MG PO TABS: 1.0000 | ORAL_TABLET | Freq: Four times a day (QID) | ORAL | 0 refills | Status: DC | PRN

## 2016-08-30 MED ORDER — MONTELUKAST SODIUM 5 MG PO CHEW: 5.0000 mg | CHEWABLE_TABLET | Freq: Every day | ORAL | 6 refills | Status: DC

## 2016-08-30 NOTE — Progress Notes (Signed)
Patient ID: Kaylee Warner, female   DOB: [birth year], 71 y.o.   MRN: [id number]    Location:  [facility] Place of Service: OFFICE  Chief Complaint  Patient presents with  . Medical Management of Chronic Issues    2 mo f/u for chronic pain , HTN    HPI:  71 yo female sen today for f/u. She reports increased stressors due to work/finance situation.   Chronic pain syndrome - She is still working as much as she can. Pain has been severe in lower back. She had an epidural injection on June 26th with Dr Maryruth Eve. no radiation of pain at this time.  She continues to struggle with post nasal drip. She uses OTC allegra daily. She has not tried any other medication.  Left buccal lesion - she cancelled her appt with ENT due to family death and personal concerns. She rescheduled appt for 8/24th. She has not noticed any change in lesion. She told her dentist about it and he is treating it.  Mood - stable on paxil. In the past, switched from cymbalta to sertraline due to cost. She finds that it does not help her self-esteem. celexa ineffective.   HTN -she has not been taking amlodipine as she thought she was supposed to stop it. She has taken lisinopril, and metoprolol hct.  Memory loss - MMSE 30/30 (previous 28/30). She was taking vayacog rx by previous PCP but too expensive. She takes aricept 10mg  and feels that it is not helping her as much as it was. She has difficulty remembering her schedule and conversations.  She is a poor historian due to memory loss. Hx obtained from chart  Past Medical History:  Diagnosis Date  . Arthritis   . Hypertension     Past Surgical History:  Procedure Laterality Date  . JOINT REPLACEMENT  2008  . SARK 2009     Dr. Jolee Ewing     Patient Care Team: Gildardo Cranker, DO as PCP - General (Internal Medicine)  Social History   Social History  . Marital status: Married    Spouse name: N/A  . Number of children: N/A  . Years of education: N/A   Occupational  History  . cook     Social History Main Topics  . Smoking status: Never Smoker  . Smokeless tobacco: Never Used  . Alcohol use 4.2 oz/week    7 Glasses of wine per week     Comment: 1 Glass a day   . Drug use: No  . Sexual activity: No   Other Topics Concern  . Not on file   Social History Narrative   Diet:Regular   Do you drink/eat things with caffeine? Yes   Marital status:  Married                             What year were you married?    Do you live in a house, apartment, assisted living, condo, trailer, etc)? House   Is it one or more stories? 2   How many persons live in your home? 2  Do you have any pets in your home? No   Current or past profession: Regulatory affairs officer   Do you exercise?   Yes  Type & how often: stretches   Do you have a living will? No    Do you have a DNR Form?  No   Do you have a POA/HPOA forms? No     reports that she has never smoked. She has never used smokeless tobacco. She reports that she drinks about 4.2 oz of alcohol per week . She reports that she does not use drugs.  Family History  Problem Relation Age of Onset  . Diabetes Sister   . Sjogren's syndrome Daughter   . Diabetes Son   . Hypertension Son   . Diabetes Son   . Diabetes Unknown        family history   . Arthritis Unknown        family history    Family Status  Relation Status  . Sister Alive  . Daughter Alive  . Son Alive  . Son Alive  . Son Alive  . Sister Alive  . Mother Alive  . Father Alive  . Unknown (Not Specified)     Allergies  Allergen Reactions  . Celecoxib     REACTION: blood pressure went up  . Codeine Itching  . Penicillins Rash    Medications: Patient's Medications  New Prescriptions   No medications on file  Previous Medications   AMLODIPINE (NORVASC) 10 MG TABLET    TAKE ONE-HALF TABLET BY MOUTH ONCE DAILY   DONEPEZIL (ARICEPT) 10 MG TABLET    Take 1 tablet (10 mg total) by mouth at  bedtime.   FERROUS SULFATE 325 (65 FE) MG TABLET    Take 1 tablet (325 mg total) by mouth daily with breakfast.   LISINOPRIL (PRINIVIL,ZESTRIL) 40 MG TABLET    Take 1 tablet (40 mg total) by mouth daily.   MELOXICAM (MOBIC) 7.5 MG TABLET    Take 1 tablet (7.5 mg total) by mouth daily.   PAROXETINE (PAXIL) 20 MG TABLET    TAKE ONE TABLET BY MOUTH ONCE DAILY  Modified Medications   Modified Medication Previous Medication   HYDROCODONE-ACETAMINOPHEN (NORCO) 10-325 MG TABLET HYDROcodone-acetaminophen (NORCO) 10-325 MG tablet      Take 1 tablet by mouth every 6 (six) hours as needed. As needed moderate - severe pain    Take 1 tablet by mouth every 6 (six) hours as needed. As needed moderate - severe pain   METOPROLOL-HYDROCHLOROTHIAZIDE (LOPRESSOR HCT) 50-25 MG TABLET metoprolol-hydrochlorothiazide (LOPRESSOR HCT) 50-25 MG tablet      Take 1 tablet by mouth daily. FOR BLOOD PRESSURE    Take 1 tablet by mouth daily. FOR BLOOD PRESSURE   TIZANIDINE (ZANAFLEX) 4 MG TABLET tiZANidine (ZANAFLEX) 4 MG tablet      Take 0.5-1 tablets (2-4 mg total) by mouth every 8 (eight) hours as needed for muscle spasms. as needed for muscle spasm    Take 0.5-1 tablets (2-4 mg total) by mouth every 8 (eight) hours as needed for muscle spasms. as needed for muscle spasm  Discontinued Medications   No medications on file    Review of Systems  Vitals:   08/30/16 1455  BP: (!) 142/74  Pulse: (!) 53  Resp: 18  Temp: 98.3 F (36.8 C)  TempSrc: Oral  SpO2: 97%  Weight: 170 lb (77.1 kg)  Height: 5\' 8"  (1.727 m)   Body mass index is 25.85 kg/m.  Physical Exam  Constitutional: She appears well-developed and well-nourished.  HENT:  Mouth/Throat: Oropharynx is clear and moist. Oral lesions (left buccal) present. No oropharyngeal exudate.  Eyes: Pupils are equal, round, and reactive to light. No scleral icterus.  Neck: Neck supple. Carotid bruit is not present. No tracheal deviation present. No thyromegaly  present.  Cardiovascular: Normal rate, regular rhythm, normal heart sounds and intact distal pulses.  Exam reveals no gallop and no friction rub.   No murmur heard. No LE edema b/l. no calf TTP.   Pulmonary/Chest: Effort normal and breath sounds normal. No stridor. No respiratory distress. She has no wheezes. She has no rales.  Abdominal: Soft. Bowel sounds are normal. She exhibits no distension and no mass. There is no hepatomegaly. There is no tenderness. There is no rebound and no guarding.  Musculoskeletal: She exhibits edema and tenderness.       Back:  Reduced left hip internal rotation with pain on ROM  Lymphadenopathy:       Head (right side): No submental and no submandibular adenopathy present.       Head (left side): No submental and no submandibular adenopathy present.    She has cervical adenopathy (rmin L>R).  Neurological: She is alert. Gait abnormal.  Skin: Skin is warm and dry. No rash noted.  Psychiatric: She has a normal mood and affect. Her behavior is normal. Judgment and thought content normal.     Labs reviewed: Office Visit on 06/24/2016  Component Date Value Ref Range Status  . Ferritin 06/24/2016 37  20 - 288 ng/mL Final  . Iron 06/24/2016 96  45 - 160 ug/dL Final    Dg Bone Density  Result Date: 08/17/2016 EXAM: DUAL X-RAY ABSORPTIOMETRY (DXA) FOR BONE MINERAL DENSITY IMPRESSION: Referring Physician:  Clifford Coudriet PATIENT: Name: Kaylee Warner, Kaylee Warner Patient ID: [id number] Birth Date: Apr 30, 1945 Height: 68.0 in. Sex: Female Measured: 08/17/2016 Weight: 168.9 lbs. Indications: Advanced Age, Estrogen Deficient, Postmenopausal Fractures: None Treatments: None ASSESSMENT: The BMD measured at Femur Neck Left is 1.007 g/cm2 with a T-score of -0.2. This patient is considered NORMAL according to [facility]) criteria. L3 and L4 were excluded due to degenerative changes. Site Region Measured Date Measured Age YA T-score BMD Significant CHANGE DualFemur Neck  Left 08/17/2016    71.3         -0.2    1.007 g/cm2 AP Spine  L1-L2     08/17/2016    71.3         1.6     1.366 g/cm2 World Health Organization [facility]) criteria for post-menopausal, Caucasian Women: Normal       T-score at or above -1 SD Osteopenia   T-score between -1 and -2.5 SD Osteoporosis T-score at or below -2.5 SD RECOMMENDATION: [facility] recommends that FDA-approved medical therapies be considered in postmenopausal women and men age 17 or older with a: 1. Hip or vertebral (clinical or morphometric) fracture. 2. T-score of <-2.5 at the spine or hip. 3. Ten-year fracture probability by FRAX of 3% or greater for hip fracture or 20% or greater for major osteoporotic fracture. All treatment decisions require clinical judgment and consideration of individual patient factors, including patient preferences, co-morbidities, previous drug use, risk factors not captured in the FRAX model (e.g. falls, vitamin D deficiency, increased bone turnover, interval significant decline in bone density) and possible under - or over-estimation of fracture risk by FRAX. All patients should ensure an adequate intake of dietary calcium (1200 mg/d) and vitamin D (800 IU daily) unless contraindicated. FOLLOW-UP: People with diagnosed cases of osteoporosis or at high risk for fracture should have regular bone mineral density tests.  For patients eligible for Medicare, routine testing is allowed once every 2 years. The testing frequency can be increased to one year for patients who have rapidly progressing disease, those who are receiving or discontinuing medical therapy to restore bone mass, or have additional risk factors. I have reviewed this report, and agree with the above findings. Mark A. Thornton Papas, M.D. [facility] Radiology Electronically Signed   By: Lavonia Dana M.D.   On: 08/17/2016 15:05     Assessment/Plan   ICD-10-CM   1. Essential hypertension I10 metoprolol-hydrochlorothiazide (LOPRESSOR HCT) 50-25  MG tablet  2. Seasonal allergic rhinitis due to pollen - failing to change as expected J30.1 montelukast (SINGULAIR) 5 MG chewable tablet  3. Chronic pain syndrome G89.4 tiZANidine (ZANAFLEX) 4 MG tablet  4. Lumbar pain with radiation down right leg M54.5 HYDROcodone-acetaminophen (NORCO) 10-325 MG tablet   START SINGULAIR (montelukast)  5MG  at bedtime to help with seasonal allergy and post nasal drip  Follow up with ENT as scheduled for left cheek lesion  Resume blood pressure medication amlodipine along with metoprolol HCT and lisinopril  Continue other medications as ordered  May take calcium with vitamin D 600mg  2 times daily for supplement bone health  Follow up with neurology as scheduled  Follow up in 2 mos for back pain, HTN, seasonal allergy.  Deyvi Bonanno S. Perlie Gold  [facility] and Adult Medicine [address] [place], [place] [address] [phone] Cell (Monday-Friday 8 AM - 5 PM) [phone] After 5 PM and follow prompts

## 2016-08-30 NOTE — Patient Instructions (Addendum)
START SINGULAIR (montelukast)  5MG  at bedtime to help with seasonal allergy and post nasal drip  Follow up with ENT as scheduled for left cheek lesion  Resume blood pressure medication amlodipine along with metoprolol HCT and lisinopril  Continue other medications as ordered  May take calcium with vitamin D 600mg  2 times daily for supplement bone health  Follow up with neurology as scheduled  DON'T FORGET TO GO TO YOUR MAMMOGRAM AND COLONOSCOPY APPTS (GI SPECIALIST)  Follow up in 2 mos for back pain, HTN, seasonal allergy.

## 2016-09-01 DIAGNOSIS — R0982 Postnasal drip: Secondary | ICD-10-CM | POA: Diagnosis not present

## 2016-09-01 DIAGNOSIS — L439 Lichen planus, unspecified: Secondary | ICD-10-CM | POA: Diagnosis not present

## 2016-09-05 ENCOUNTER — Ambulatory Visit
Admission: RE | Admit: 2016-09-05 | Discharge: 2016-09-05 | Disposition: A | Discharge status: Home / Self Care | Payer: Medicare Other | Source: Ambulatory Visit | Attending: Internal Medicine | Admitting: Internal Medicine

## 2016-09-05 DIAGNOSIS — Z1239 Encounter for other screening for malignant neoplasm of breast: Secondary | ICD-10-CM

## 2016-09-05 DIAGNOSIS — Z1231 Encounter for screening mammogram for malignant neoplasm of breast: Secondary | ICD-10-CM | POA: Diagnosis not present

## 2016-09-21 DIAGNOSIS — M48062 Spinal stenosis, lumbar region with neurogenic claudication: Secondary | ICD-10-CM | POA: Diagnosis not present

## 2016-09-21 DIAGNOSIS — M5416 Radiculopathy, lumbar region: Secondary | ICD-10-CM | POA: Diagnosis not present

## 2016-10-04 ENCOUNTER — Ambulatory Visit (AMBULATORY_SURGERY_CENTER): Payer: Self-pay | Admitting: *Deleted

## 2016-10-04 ENCOUNTER — Encounter: Payer: Self-pay | Admitting: Gastroenterology

## 2016-10-04 VITALS — Ht 69.0 in | Wt 171.0 lb

## 2016-10-04 DIAGNOSIS — Z1211 Encounter for screening for malignant neoplasm of colon: Secondary | ICD-10-CM

## 2016-10-04 MED ORDER — NA SULFATE-K SULFATE-MG SULF 17.5-3.13-1.6 GM/177ML PO SOLN: 1.0000 [IU] | Freq: Once | ORAL | 0 refills | Status: AC

## 2016-10-04 NOTE — Progress Notes (Signed)
No egg or soy allergy known to patient  No issues with past sedation with any surgeries  or procedures, no intubation problems  No diet pills per patient No home 02 use per patient  No blood thinners per patient  Pt denies issues with constipation  No A fib or A flutter  EMMI video sent to pt's e mail  

## 2016-10-17 ENCOUNTER — Encounter: Payer: Self-pay | Admitting: Gastroenterology

## 2016-10-17 ENCOUNTER — Ambulatory Visit (AMBULATORY_SURGERY_CENTER): Payer: Medicare Other | Admitting: Gastroenterology

## 2016-10-17 VITALS — BP 114/67 | HR 60 | Temp 98.0°F | Resp 19 | Ht 69.0 in | Wt 171.0 lb

## 2016-10-17 DIAGNOSIS — Z1211 Encounter for screening for malignant neoplasm of colon: Secondary | ICD-10-CM | POA: Diagnosis present

## 2016-10-17 DIAGNOSIS — K5730 Diverticulosis of large intestine without perforation or abscess without bleeding: Secondary | ICD-10-CM | POA: Diagnosis not present

## 2016-10-17 DIAGNOSIS — D126 Benign neoplasm of colon, unspecified: Secondary | ICD-10-CM

## 2016-10-17 DIAGNOSIS — D122 Benign neoplasm of ascending colon: Secondary | ICD-10-CM | POA: Diagnosis not present

## 2016-10-17 DIAGNOSIS — K635 Polyp of colon: Secondary | ICD-10-CM | POA: Diagnosis not present

## 2016-10-17 DIAGNOSIS — D125 Benign neoplasm of sigmoid colon: Secondary | ICD-10-CM

## 2016-10-17 MED ORDER — SODIUM CHLORIDE 0.9 % IV SOLN: 500.0000 mL | INTRAVENOUS | Status: DC

## 2016-10-17 NOTE — Progress Notes (Signed)
Called to room to assist during endoscopic procedure.  Patient ID and intended procedure confirmed with present staff. Received instructions for my participation in the procedure from the performing physician. maw 

## 2016-10-17 NOTE — Progress Notes (Signed)
Pt. states no medical or surgical changes since previsit or office visit.

## 2016-10-17 NOTE — Progress Notes (Signed)
Report to PACU, RN, vss, BBS= Clear.  

## 2016-10-17 NOTE — Op Note (Signed)
[facility] Patient Name: Kaylee Warner Procedure Date: 10/17/2016 10:51 AM MRN: [id number] Endoscopist: Milus Banister , MD Age: 71 Referring MD:  Date of Birth: 1945-07-19 Gender: Female Account #: [id number] Procedure:                Colonoscopy Indications:              Screening for colorectal malignant neoplasm Medicines:                Monitored Anesthesia Care Procedure:                Pre-Anesthesia Assessment:                           - Prior to the procedure, a History and Physical                            was performed, and patient medications and                            allergies were reviewed. The patient's tolerance of                            previous anesthesia was also reviewed. The risks                            and benefits of the procedure and the sedation                            options and risks were discussed with the patient.                            All questions were answered, and informed consent                            was obtained. Prior Anticoagulants: The patient has                            taken no previous anticoagulant or antiplatelet                            agents. ASA Grade Assessment: II - A patient with                            mild systemic disease. After reviewing the risks                            and benefits, the patient was deemed in                            satisfactory condition to undergo the procedure.                           After obtaining informed consent, the colonoscope  was passed under direct vision. Throughout the                            procedure, the patient's blood pressure, pulse, and                            oxygen saturations were monitored continuously. The                            Colonoscope was introduced through the anus and                            advanced to the the cecum, identified by                            appendiceal orifice and  ileocecal valve. The                            colonoscopy was performed without difficulty. The                            patient tolerated the procedure well. The quality                            of the bowel preparation was excellent. The                            ileocecal valve, appendiceal orifice, and rectum                            were photographed. Scope In: 10:56:20 AM Scope Out: 11:07:22 AM Scope Withdrawal Time: 0 hours 8 minutes 38 seconds  Total Procedure Duration: 0 hours 11 minutes 2 seconds  Findings:                 Two sessile polyps were found in the sigmoid colon                            and ascending colon. The polyps were 2 to 3 mm in                            size. These polyps were removed with a cold snare.                            Resection and retrieval were complete.                           Multiple small-mouthed diverticula were found in                            the left colon.                           The exam was otherwise without abnormality on  direct and retroflexion views. Complications:            No immediate complications. Estimated blood loss:                            None. Estimated Blood Loss:     Estimated blood loss: none. Impression:               - Two 2 to 3 mm polyps in the sigmoid colon and in                            the ascending colon, removed with a cold snare.                            Resected and retrieved.                           - Diverticulosis in the left colon.                           - The examination was otherwise normal on direct                            and retroflexion views. Recommendation:           - Patient has a contact number available for                            emergencies. The signs and symptoms of potential                            delayed complications were discussed with the                            patient. Return to normal activities tomorrow.                             Written discharge instructions were provided to the                            patient.                           - Resume previous diet.                           - Continue present medications.                           You will receive a letter within 2-3 weeks with the                            pathology results and my final recommendations.                           If the polyp(s) is proven to be 'pre-cancerous' on  pathology, you will need repeat colonoscopy in 5                            years. If the polyp(s) is NOT 'precancerous' on                            pathology then you should repeat colon cancer                            screening in 10 years with colonoscopy without need                            for colon cancer screening by any method prior to                            then (including stool testing). Milus Banister, MD 10/17/2016 11:11:46 AM This report has been signed electronically.

## 2016-10-17 NOTE — Patient Instructions (Signed)
**   Handouts given on polyps and diverticulosis **   YOU HAD AN ENDOSCOPIC PROCEDURE TODAY AT THE [facility]:   Refer to the procedure report that was given to you for any specific questions about what was found during the examination.  If the procedure report does not answer your questions, please call your gastroenterologist to clarify.  If you requested that your care partner not be given the details of your procedure findings, then the procedure report has been included in a sealed envelope for you to review at your convenience later.  YOU SHOULD EXPECT: Some feelings of bloating in the abdomen. Passage of more gas than usual.  Walking can help get rid of the air that was put into your GI tract during the procedure and reduce the bloating. If you had a lower endoscopy (such as a colonoscopy or flexible sigmoidoscopy) you may notice spotting of blood in your stool or on the toilet paper. If you underwent a bowel prep for your procedure, you may not have a normal bowel movement for a few days.  Please Note:  You might notice some irritation and congestion in your nose or some drainage.  This is from the oxygen used during your procedure.  There is no need for concern and it should clear up in a day or so.  SYMPTOMS TO REPORT IMMEDIATELY:   Following lower endoscopy (colonoscopy or flexible sigmoidoscopy):  Excessive amounts of blood in the stool  Significant tenderness or worsening of abdominal pains  Swelling of the abdomen that is new, acute  Fever of 100F or higher  For urgent or emergent issues, a gastroenterologist can be reached at any hour by calling [phone].   DIET:  We do recommend a small meal at first, but then you may proceed to your regular diet.  Drink plenty of fluids but you should avoid alcoholic beverages for 24 hours.  ACTIVITY:  You should plan to take it easy for the rest of today and you should NOT DRIVE or use heavy machinery until tomorrow (because  of the sedation medicines used during the test).    FOLLOW UP: Our staff will call the number listed on your records the next business day following your procedure to check on you and address any questions or concerns that you may have regarding the information given to you following your procedure. If we do not reach you, we will leave a message.  However, if you are feeling well and you are not experiencing any problems, there is no need to return our call.  We will assume that you have returned to your regular daily activities without incident.  If any biopsies were taken you will be contacted by phone or by letter within the next 1-3 weeks.  Please call us at [phone] if you have not heard about the biopsies in 3 weeks.    SIGNATURES/CONFIDENTIALITY: You and/or your care partner have signed paperwork which will be entered into your electronic medical record.  These signatures attest to the fact that that the information above on your After Visit Summary has been reviewed and is understood.  Full responsibility of the confidentiality of this discharge information lies with you and/or your care-partner. 

## 2016-10-18 ENCOUNTER — Telehealth: Payer: Self-pay | Admitting: *Deleted

## 2016-10-18 NOTE — Telephone Encounter (Signed)
  Follow up Call-  Call back number 10/17/2016  Post procedure Call Back phone  # [phone]  Permission to leave phone message Yes  Some recent data might be hidden     Patient questions:  Do you have a fever, pain , or abdominal swelling? No. Pain Score  0 *  Have you tolerated food without any problems? Yes.    Have you been able to return to your normal activities? Yes.    Do you have any questions about your discharge instructions: Diet   No. Medications  No. Follow up visit  No.  Do you have questions or concerns about your Care? No.  Actions: * If pain score is 4 or above: No action needed, pain <4.

## 2016-10-20 ENCOUNTER — Telehealth: Payer: Self-pay | Admitting: Internal Medicine

## 2016-10-20 ENCOUNTER — Encounter: Payer: Self-pay | Admitting: Gastroenterology

## 2016-10-20 NOTE — Telephone Encounter (Signed)
I left a message asking the pt to confirm whether or not she can make this AWV-S with nurse on 12/15/16. (Last AWV 12/16/15). VDM (DD)

## 2016-10-21 ENCOUNTER — Other Ambulatory Visit: Payer: Self-pay | Admitting: Internal Medicine

## 2016-10-21 DIAGNOSIS — I10 Essential (primary) hypertension: Secondary | ICD-10-CM

## 2016-11-02 ENCOUNTER — Ambulatory Visit: Payer: Medicare Other | Admitting: Internal Medicine

## 2016-12-06 DIAGNOSIS — S01511A Laceration without foreign body of lip, initial encounter: Secondary | ICD-10-CM | POA: Diagnosis not present

## 2016-12-15 ENCOUNTER — Ambulatory Visit (INDEPENDENT_AMBULATORY_CARE_PROVIDER_SITE_OTHER): Payer: Medicare Other

## 2016-12-15 VITALS — BP 125/70 | HR 59 | Temp 98.1°F | Ht 68.0 in | Wt 169.0 lb

## 2016-12-15 DIAGNOSIS — M545 Low back pain: Secondary | ICD-10-CM | POA: Diagnosis not present

## 2016-12-15 DIAGNOSIS — M79604 Pain in right leg: Secondary | ICD-10-CM

## 2016-12-15 DIAGNOSIS — Z0000 Encounter for general adult medical examination without abnormal findings: Secondary | ICD-10-CM | POA: Diagnosis not present

## 2016-12-15 DIAGNOSIS — Z23 Encounter for immunization: Secondary | ICD-10-CM

## 2016-12-15 MED ORDER — HYDROCODONE-ACETAMINOPHEN 10-325 MG PO TABS: 1.0000 | ORAL_TABLET | Freq: Four times a day (QID) | ORAL | 0 refills | Status: DC | PRN

## 2016-12-15 MED ORDER — TETANUS-DIPHTH-ACELL PERTUSSIS 5-2.5-18.5 LF-MCG/0.5 IM SUSP: 0.5000 mL | Freq: Once | INTRAMUSCULAR | 0 refills | Status: AC

## 2016-12-15 NOTE — Patient Instructions (Signed)
Ms. Kaylee Warner , Thank you for taking time to come for your Medicare Wellness Visit. I appreciate your ongoing commitment to your health goals. Please review the following plan we discussed and let me know if I can assist you in the future.   Screening recommendations/referrals: Colonoscopy up to date. You will age out at 14 Mammogram up to date. Due 09/06/2018 Bone Density up to date Recommended yearly ophthalmology/optometry visit for glaucoma screening and checkup Recommended yearly dental visit for hygiene and checkup  Vaccinations: Influenza vaccine given today. Due 2019 fall season Pneumococcal vaccine 23 due, get at your January visit Tdap vaccine due, prescription sent to pharmacy Shingles vaccine due, declined-stated you never had chicken pox    Advanced directives: Please bring [place] a copy of your living will and health care power of attorney  Conditions/risks identified: None  Next appointment: Dr. Eulas Post 01/17/2017 @ 2:15pm   Preventive Care 65 Years and Older, Female Preventive care refers to lifestyle choices and visits with your health care provider that can promote health and wellness. What does preventive care include?  A yearly physical exam. This is also called an annual well check.  Dental exams once or twice a year.  Routine eye exams. Ask your health care provider how often you should have your eyes checked.  Personal lifestyle choices, including:  Daily care of your teeth and gums.  Regular physical activity.  Eating a healthy diet.  Avoiding tobacco and drug use.  Limiting alcohol use.  Practicing safe sex.  Taking low-dose aspirin every day.  Taking vitamin and mineral supplements as recommended by your health care provider. What happens during an annual well check? The services and screenings done by your health care provider during your annual well check will depend on your age, overall health, lifestyle risk factors, and family history of  disease. Counseling  Your health care provider may ask you questions about your:  Alcohol use.  Tobacco use.  Drug use.  Emotional well-being.  Home and relationship well-being.  Sexual activity.  Eating habits.  History of falls.  Memory and ability to understand (cognition).  Work and work Statistician.  Reproductive health. Screening  You may have the following tests or measurements:  Height, weight, and BMI.  Blood pressure.  Lipid and cholesterol levels. These may be checked every 5 years, or more frequently if you are over 29 years old.  Skin check.  Lung cancer screening. You may have this screening every year starting at age 24 if you have a 30-pack-year history of smoking and currently smoke or have quit within the past 15 years.  Fecal occult blood test (FOBT) of the stool. You may have this test every year starting at age 42.  Flexible sigmoidoscopy or colonoscopy. You may have a sigmoidoscopy every 5 years or a colonoscopy every 10 years starting at age 9.  Hepatitis C blood test.  Hepatitis B blood test.  Sexually transmitted disease (STD) testing.  Diabetes screening. This is done by checking your blood sugar (glucose) after you have not eaten for a while (fasting). You may have this done every 1-3 years.  Bone density scan. This is done to screen for osteoporosis. You may have this done starting at age 104.  Mammogram. This may be done every 1-2 years. Talk to your health care provider about how often you should have regular mammograms. Talk with your health care provider about your test results, treatment options, and if necessary, the need for more tests. Vaccines  Your health care provider may recommend certain vaccines, such as:  Influenza vaccine. This is recommended every year.  Tetanus, diphtheria, and acellular pertussis (Tdap, Td) vaccine. You may need a Td booster every 10 years.  Zoster vaccine. You may need this after age  74.  Pneumococcal 13-valent conjugate (PCV13) vaccine. One dose is recommended after age 19.  Pneumococcal polysaccharide (PPSV23) vaccine. One dose is recommended after age 2. Talk to your health care provider about which screenings and vaccines you need and how often you need them. This information is not intended to replace advice given to you by your health care provider. Make sure you discuss any questions you have with your health care provider. Document Released: 01/23/2015 Document Revised: 09/16/2015 Document Reviewed: 10/28/2014 Elsevier Interactive Patient Education  2017 [employer] Prevention in the Home Falls can cause injuries. They can happen to people of all ages. There are many things you can do to make your home safe and to help prevent falls. What can I do on the outside of my home?  Regularly fix the edges of walkways and driveways and fix any cracks.  Remove anything that might make you trip as you walk through a door, such as a raised step or threshold.  Trim any bushes or trees on the path to your home.  Use bright outdoor lighting.  Clear any walking paths of anything that might make someone trip, such as rocks or tools.  Regularly check to see if handrails are loose or broken. Make sure that both sides of any steps have handrails.  Any raised decks and porches should have guardrails on the edges.  Have any leaves, snow, or ice cleared regularly.  Use sand or salt on walking paths during winter.  Clean up any spills in your garage right away. This includes oil or grease spills. What can I do in the bathroom?  Use night lights.  Install grab bars by the toilet and in the tub and shower. Do not use towel bars as grab bars.  Use non-skid mats or decals in the tub or shower.  If you need to sit down in the shower, use a plastic, non-slip stool.  Keep the floor dry. Clean up any water that spills on the floor as soon as it happens.  Remove  soap buildup in the tub or shower regularly.  Attach bath mats securely with double-sided non-slip rug tape.  Do not have throw rugs and other things on the floor that can make you trip. What can I do in the bedroom?  Use night lights.  Make sure that you have a light by your bed that is easy to reach.  Do not use any sheets or blankets that are too big for your bed. They should not hang down onto the floor.  Have a firm chair that has side arms. You can use this for support while you get dressed.  Do not have throw rugs and other things on the floor that can make you trip. What can I do in the kitchen?  Clean up any spills right away.  Avoid walking on wet floors.  Keep items that you use a lot in easy-to-reach places.  If you need to reach something above you, use a strong step stool that has a grab bar.  Keep electrical cords out of the way.  Do not use floor polish or wax that makes floors slippery. If you must use wax, use non-skid floor wax.  Do  not have throw rugs and other things on the floor that can make you trip. What can I do with my stairs?  Do not leave any items on the stairs.  Make sure that there are handrails on both sides of the stairs and use them. Fix handrails that are broken or loose. Make sure that handrails are as long as the stairways.  Check any carpeting to make sure that it is firmly attached to the stairs. Fix any carpet that is loose or worn.  Avoid having throw rugs at the top or bottom of the stairs. If you do have throw rugs, attach them to the floor with carpet tape.  Make sure that you have a light switch at the top of the stairs and the bottom of the stairs. If you do not have them, ask someone to add them for you. What else can I do to help prevent falls?  Wear shoes that:  Do not have high heels.  Have rubber bottoms.  Are comfortable and fit you well.  Are closed at the toe. Do not wear sandals.  If you use a  stepladder:  Make sure that it is fully opened. Do not climb a closed stepladder.  Make sure that both sides of the stepladder are locked into place.  Ask someone to hold it for you, if possible.  Clearly mark and make sure that you can see:  Any grab bars or handrails.  First and last steps.  Where the edge of each step is.  Use tools that help you move around (mobility aids) if they are needed. These include:  Canes.  Walkers.  Scooters.  Crutches.  Turn on the lights when you go into a dark area. Replace any light bulbs as soon as they burn out.  Set up your furniture so you have a clear path. Avoid moving your furniture around.  If any of your floors are uneven, fix them.  If there are any pets around you, be aware of where they are.  Review your medicines with your doctor. Some medicines can make you feel dizzy. This can increase your chance of falling. Ask your doctor what other things that you can do to help prevent falls. This information is not intended to replace advice given to you by your health care provider. Make sure you discuss any questions you have with your health care provider. Document Released: 10/23/2008 Document Revised: 06/04/2015 Document Reviewed: 01/31/2014 Elsevier Interactive Patient Education  2017 [employer].

## 2016-12-15 NOTE — Progress Notes (Signed)
Subjective:   Kaylee Warner is a 71 y.o. female who presents for Medicare Annual (Subsequent) preventive examination.  Last AWV-12/15/2015    Objective:     Vitals: BP 125/70 (BP Location: Left Arm, Patient Position: Sitting)   Pulse (!) 59   Temp 98.1 F (36.7 C) (Oral)   Ht 5\' 8"  (1.727 m)   Wt 169 lb (76.7 kg)   SpO2 96%   BMI 25.70 kg/m   Body mass index is 25.7 kg/m.  Advanced Directives 12/15/2016 10/17/2016 10/04/2016 08/30/2016 05/16/2016 02/24/2016 12/16/2015  Does Patient Have a Medical Advance Directive? No No No No No No No  Type of Advance Directive - - - - - - -  Copy of Healthcare Power of Attorney in Chart? - - - - - - -  Would patient like information on creating a medical advance directive? Yes (MAU/Ambulatory/Procedural Areas - Information given) - - No - Patient declined - No - Patient declined Yes (MAU/Ambulatory/Procedural Areas - Information given)    Tobacco Social History   Tobacco Use  Smoking Status Never Smoker  Smokeless Tobacco Never Used     Counseling given: Not Answered   Clinical Intake:  Pre-visit preparation completed: No  Pain : 0-10 Pain Score: 9  Pain Location: Back Pain Orientation: Lower Pain Descriptors / Indicators: Aching Pain Onset: More than a month ago Pain Frequency: Intermittent     Nutritional Risks: None Diabetes: No  Activities of Daily Living: Independent Ambulation: Independent with device- listed below Home Assistive Devices/Equipment: Eyeglasses, Dentures (specify type)(partial) Medication Administration: Independent Home Management: Independent  Barriers to Care Management & Learning: None  Do you feel unsafe in your current relationship?: No Do you feel physically threatened by others?: No Anyone hurting you at home, work, or school?: No Unable to ask?: No Information provided on Community resources: No  How often do you need to have someone help you when you read instructions, pamphlets, or  other written materials from your doctor or pharmacy?: 1 - Never What is the last grade level you completed in school?: 12th grade  Interpreter Needed?: No  Information entered by :: Rich Reining, RN  Past Medical History:  Diagnosis Date  . Allergy   . Anemia   . Arthritis   . Hypertension    Past Surgical History:  Procedure Laterality Date  . COLONOSCOPY    . JOINT REPLACEMENT  2008  . SARK 2009     Dr. Jolee Ewing    Family History  Problem Relation Age of Onset  . Diabetes Sister   . Sjogren's syndrome Daughter   . Diabetes Son   . Hypertension Son   . Diabetes Son   . Diabetes Unknown        family history   . Arthritis Unknown        family history   . Colon polyps Neg Hx   . Colon cancer Neg Hx   . Esophageal cancer Neg Hx   . Stomach cancer Neg Hx   . Rectal cancer Neg Hx    Social History   Socioeconomic History  . Marital status: Married    Spouse name: None  . Number of children: None  . Years of education: None  . Highest education level: None  Social Needs  . Financial resource strain: Not hard at all  . Food insecurity - worry: Never true  . Food insecurity - inability: Never true  . Transportation needs - medical: No  . Transportation needs -  non-medical: No  Occupational History  . Occupation: cook   Tobacco Use  . Smoking status: Never Smoker  . Smokeless tobacco: Never Used  Substance and Sexual Activity  . Alcohol use: Yes    Alcohol/week: 4.2 oz    Types: 7 Glasses of wine per week    Comment: 1 Glass a day   . Drug use: No  . Sexual activity: No  Other Topics Concern  . None  Social History Narrative   Diet:Regular   Do you drink/eat things with caffeine? Yes   Marital status:  Married                             What year were you married?    Do you live in a house, apartment, assisted living, condo, trailer, etc)? House   Is it one or more stories? 2   How many persons live in your home? 2  Do you have any pets in your home?  No   Current or past profession: Regulatory affairs officer   Do you exercise?   Yes                                                  Type & how often: stretches   Do you have a living will? No    Do you have a DNR Form?  No   Do you have a POA/HPOA forms? No    Outpatient Encounter Medications as of 12/15/2016  Medication Sig  . amLODipine (NORVASC) 10 MG tablet TAKE ONE-HALF TABLET BY MOUTH ONCE DAILY  . donepezil (ARICEPT) 10 MG tablet Take 1 tablet (10 mg total) by mouth at bedtime.  . ferrous sulfate 325 (65 FE) MG tablet Take 1 tablet (325 mg total) by mouth daily with breakfast.  . meloxicam (MOBIC) 7.5 MG tablet Take 1 tablet (7.5 mg total) by mouth daily.  Marland Kitchen PARoxetine (PAXIL) 20 MG tablet TAKE ONE TABLET BY MOUTH ONCE DAILY  . HYDROcodone-acetaminophen (NORCO) 10-325 MG tablet Take 1 tablet by mouth every 6 (six) hours as needed. As needed moderate - severe pain (Patient not taking: Reported on 10/17/2016)  . lisinopril (PRINIVIL,ZESTRIL) 40 MG tablet TAKE 1 TABLET BY MOUTH ONCE DAILY (Patient not taking: Reported on 12/15/2016)  . metoprolol-hydrochlorothiazide (LOPRESSOR HCT) 50-25 MG tablet Take 1 tablet by mouth daily. FOR BLOOD PRESSURE (Patient not taking: Reported on 12/15/2016)  . tiZANidine (ZANAFLEX) 4 MG tablet Take 0.5-1 tablets (2-4 mg total) by mouth every 8 (eight) hours as needed for muscle spasms. as needed for muscle spasm (Patient not taking: Reported on 10/17/2016)   No facility-administered encounter medications on file as of 12/15/2016.     Activities of Daily Living In your present state of health, do you have any difficulty performing the following activities: 12/15/2016 12/16/2015  Hearing? N N  Vision? N N  Difficulty concentrating or making decisions? Y Y  Comment - Occasional forgetfulness  Walking or climbing stairs? N N  Dressing or bathing? N N  Doing errands, shopping? N N  Comment - Pt still drives vehicle.   Preparing Food and eating ? N N  Using the  Toilet? N N  In the past six months, have you accidently leaked urine? N N  Do you have problems with loss of bowel control?  N N  Managing your Medications? N N  Managing your Finances? N N  Housekeeping or managing your Housekeeping? N N  Some recent data might be hidden    Timed Get Up and Go performed: 14 seconds, within normal limits  Patient Care Team: Gildardo Cranker, DO as PCP - General (Internal Medicine)    Assessment:     Exercise Activities and Dietary recommendations Current Exercise Habits: Structured exercise class, Type of exercise: Other - see comments(water aerobics), Time (Minutes): 30, Frequency (Times/Week): 1, Weekly Exercise (Minutes/Week): 30, Intensity: Mild, Exercise limited by: None identified  Goals    . Patient Stated     Patient will increase water aerobics and increase water intake      Fall Risk Fall Risk  12/15/2016 06/24/2016 05/16/2016 02/24/2016 12/16/2015  Falls in the past year? Yes Yes No No No  Number falls in past yr: 1 2 or more - - -  Injury with Fall? Yes No - - -   Is the patient's home free of loose throw rugs in walkways, pet beds, electrical cords, etc?   yes      Grab bars in the bathroom? yes      Handrails on the stairs?   yes      Adequate lighting?   yes  Depression Screen PHQ 2/9 Scores 12/15/2016 12/16/2015 07/15/2015 06/13/2014  PHQ - 2 Score 0 1 0 0     Cognitive Function MMSE - Mini Mental State Exam 12/16/2015 05/01/2015  Not completed: - (No Data)  Orientation to time 5 4  Orientation to Place 5 4  Registration 3 3  Attention/ Calculation 5 5  Recall 3 3  Language- name 2 objects 2 2  Language- repeat 1 1  Language- follow 3 step command 3 3  Language- read & follow direction 1 1  Write a sentence 1 1  Copy design 1 1  Total score 30 28        Immunization History  Administered Date(s) Administered  . Influenza,inj,Quad PF,6+ Mos 09/24/2015  . Pneumococcal Conjugate-13 12/16/2015   Screening Tests Health  Maintenance  Topic Date Due  . Hepatitis C Screening  [birth year]  . INFLUENZA VACCINE  08/10/2016  . PNA vac Low Risk Adult (2 of 2 - PPSV23) 12/15/2016  . TETANUS/TDAP  01/09/2017 (Originally 04[birth year]03/1964)  . MAMMOGRAM  09/06/2018  . COLONOSCOPY  10/17/2021  . DEXA SCAN  Completed   Cancer Screenings: Lung:  Low Dose CT Chest recommended if Age 1-80 years, 30 pack-year currently smoking OR have quit w/in 15years. Patient does not qualify. Breast: Up to date on Mammogram? Yes  Up to date of Bone Density/Dexa? Yes Colorectal: up to date.  Additional Screenings: Hepatitis B/HIV/Syphillis:Declined Hepatitis C Screening: Declined     Plan:    I have personally reviewed and addressed the Medicare Annual Wellness questionnaire and have noted the following in the patient's chart:  A. Medical and social history B. Use of alcohol, tobacco or illicit drugs  C. Current medications and supplements D. Functional ability and status E.  Nutritional status F.  Physical activity G. Advance directives H. List of other physicians I.  Hospitalizations, surgeries, and ER visits in previous 12 months J.  [place] to include hearing, vision, cognitive, depression L. Referrals and appointments - none  In addition, I have reviewed and discussed with patient certain preventive protocols, quality metrics, and best practice recommendations. A written personalized care plan for preventive services as well as general preventive  health recommendations were provided to patient.  See attached scanned questionnaire for additional information.   Signed,   Rich Reining, RN Nurse Health Advisor   Quick Notes   Health Maintenance: Norco refilled. TDAP prescription sent to pharmacy. Shingrix declined-stated she never had chicken pox .Flu vaccine given. Hep C declined.     Abnormal Screen: MMSE 27/30. Did not pass clock drawing     Patient Concerns: Pt would like to discuss Aricept with  doctor     Nurse Concerns: None

## 2016-12-21 DIAGNOSIS — M5416 Radiculopathy, lumbar region: Secondary | ICD-10-CM | POA: Diagnosis not present

## 2016-12-21 DIAGNOSIS — M48062 Spinal stenosis, lumbar region with neurogenic claudication: Secondary | ICD-10-CM | POA: Diagnosis not present

## 2017-01-17 ENCOUNTER — Ambulatory Visit: Payer: Medicare Other | Admitting: Internal Medicine

## 2017-01-17 ENCOUNTER — Encounter: Payer: Self-pay | Admitting: Internal Medicine

## 2017-01-17 VITALS — BP 136/88 | HR 58 | Temp 97.9°F | Ht 68.0 in | Wt 175.0 lb

## 2017-01-17 DIAGNOSIS — Z79899 Other long term (current) drug therapy: Secondary | ICD-10-CM

## 2017-01-17 DIAGNOSIS — R413 Other amnesia: Secondary | ICD-10-CM

## 2017-01-17 DIAGNOSIS — Z23 Encounter for immunization: Secondary | ICD-10-CM | POA: Diagnosis not present

## 2017-01-17 DIAGNOSIS — W19XXXA Unspecified fall, initial encounter: Secondary | ICD-10-CM

## 2017-01-17 DIAGNOSIS — S01511A Laceration without foreign body of lip, initial encounter: Secondary | ICD-10-CM | POA: Diagnosis not present

## 2017-01-17 DIAGNOSIS — G8929 Other chronic pain: Secondary | ICD-10-CM

## 2017-01-17 DIAGNOSIS — M25511 Pain in right shoulder: Secondary | ICD-10-CM

## 2017-01-17 DIAGNOSIS — I10 Essential (primary) hypertension: Secondary | ICD-10-CM | POA: Diagnosis not present

## 2017-01-17 DIAGNOSIS — L659 Nonscarring hair loss, unspecified: Secondary | ICD-10-CM

## 2017-01-17 DIAGNOSIS — M549 Dorsalgia, unspecified: Secondary | ICD-10-CM | POA: Diagnosis not present

## 2017-01-17 LAB — COMPLETE METABOLIC PANEL WITH GFR
AG Ratio: 1.6 (calc) (ref 1.0–2.5)
ALT: 11 U/L (ref 6–29)
AST: 14 U/L (ref 10–35)
Albumin: 4 g/dL (ref 3.6–5.1)
Alkaline phosphatase (APISO): 54 U/L (ref 33–130)
BUN: 11 mg/dL (ref 7–25)
CO2: 26 mmol/L (ref 20–32)
Calcium: 9.4 mg/dL (ref 8.6–10.4)
Chloride: 105 mmol/L (ref 98–110)
Creat: 0.69 mg/dL (ref 0.60–0.93)
GFR, Est African American: 102 mL/min/{1.73_m2} (ref >=60)
GFR, Est Non African American: 88 mL/min/{1.73_m2} (ref >=60)
Globulin: 2.5 g/dL (calc) (ref 1.9–3.7)
Glucose, Bld: 85 mg/dL (ref 65–99)
Potassium: 4.1 mmol/L (ref 3.5–5.3)
Sodium: 138 mmol/L (ref 135–146)
Total Bilirubin: 0.5 mg/dL (ref 0.2–1.2)
Total Protein: 6.5 g/dL (ref 6.1–8.1)

## 2017-01-17 NOTE — Patient Instructions (Addendum)
TAKE ARICEPT (DONEPIZIL) 1/2 TABLET AT BEDTIME X 4 WEEKS THEN INCREASE TO 1 TABLET AT BEDTIME FOR MEMORY LOSS  Will call with lab results and imaging results  Continue other medications as ordered  Follow up in 3 mos for memory loss, HTN, mood d/o, chronic pain syndrome

## 2017-01-17 NOTE — Progress Notes (Signed)
Patient ID: Kaylee Warner, female   DOB: [birth year], 72 y.o.   MRN: [id number]   Location:  [facility] OFFICE  Provider: DR Arletha Grippe  Code Status: FULL CODE Goals of Care:  Advanced Directives 12/15/2016  Does Patient Have a Medical Advance Directive? No  Type of Advance Directive -  Copy of [place] in Chart? -  Would patient like information on creating a medical advance directive? Yes (MAU/Ambulatory/Procedural Areas - Information given)     Chief Complaint  Patient presents with  . Medical Management of Chronic Issues    5 month follow-up, discuss B/P medication, patient c/o hair loss and night sweats. + fall risk  . Medication Management    Patient is requesting a full review of all medications, patient off dementia medication since last office visit, need to discuss     HPI: Patient is a 72 y.o. female seen today for medical management of chronic diseases.  She went to [facility] after Christmas when she fell and inured upper mouth with teeth. She rec'd stitches and states she can still fell them. She then fell again about 1 week ago while walking on wet pavement and injured right shoulder and left knee. She did not seek medical attention. She is a poor historian due to memory loss. Hx obtained from chart.  Chronic pain syndrome - She is still working as much as she can. Pain severe in lower back. She had an epidural injection on July 05, 2016 with Dr Maryruth Eve. No radiation of pain at this time.  Seasonal allergy - takes allegra daily prn  Left buccal lichen planus - she saw ENT Dr Redmond Baseman in Aug 2018 and he dx left cheek lesion as lichen planus. No bx recommended as she did not have sx's. Her dentist is also following the area.  Mood - stable on paxil. In the past, switched from cymbalta to sertraline due to cost. She finds that it does not help her self-esteem. celexa ineffective.   HTN - BP stable on amlodipine, lisinopril, and metoprolol hct.  Memory loss - MMSE  30/30 (previous 28/30). She was taking vayacog rx by previous PCP but too expensive. She stopped taking her aricept shortly after her last OV. Memory has worsened. She has difficulty remembering her schedule and conversations. She does not remember when she fell last yr   Past Medical History:  Diagnosis Date  . Allergy   . Anemia   . Arthritis   . Hypertension     Past Surgical History:  Procedure Laterality Date  . COLONOSCOPY    . JOINT REPLACEMENT  2008  . SARK 2009     Dr. Jolee Ewing      reports that  has never smoked. she has never used smokeless tobacco. She reports that she drinks about 4.2 oz of alcohol per week. She reports that she does not use drugs. Social History   Socioeconomic History  . Marital status: Married    Spouse name: Not on file  . Number of children: Not on file  . Years of education: Not on file  . Highest education level: Not on file  Social Needs  . Financial resource strain: Not hard at all  . Food insecurity - worry: Never true  . Food insecurity - inability: Never true  . Transportation needs - medical: No  . Transportation needs - non-medical: No  Occupational History  . Occupation: cook   Tobacco Use  . Smoking status: Never Smoker  .  Smokeless tobacco: Never Used  Substance and Sexual Activity  . Alcohol use: Yes    Alcohol/week: 4.2 oz    Types: 7 Glasses of wine per week    Comment: 1 Glass a day   . Drug use: No  . Sexual activity: No  Other Topics Concern  . Not on file  Social History Narrative   Diet:Regular   Do you drink/eat things with caffeine? Yes   Marital status:  Married                             What year were you married?    Do you live in a house, apartment, assisted living, condo, trailer, etc)? House   Is it one or more stories? 2   How many persons live in your home? 2  Do you have any pets in your home? No   Current or past profession: Regulatory affairs officer   Do you exercise?   Yes                                                   Type & how often: stretches   Do you have a living will? No    Do you have a DNR Form?  No   Do you have a POA/HPOA forms? No    Family History  Problem Relation Age of Onset  . Diabetes Sister   . Sjogren's syndrome Daughter   . Diabetes Son   . Hypertension Son   . Diabetes Son   . Diabetes Unknown        family history   . Arthritis Unknown        family history   . Colon polyps Neg Hx   . Colon cancer Neg Hx   . Esophageal cancer Neg Hx   . Stomach cancer Neg Hx   . Rectal cancer Neg Hx     Allergies  Allergen Reactions  . Celecoxib     REACTION: blood pressure went up  . Codeine Itching  . Fexofenadine Other (See Comments)    unknown  . Penicillins Rash    Outpatient Encounter Medications as of 01/17/2017  Medication Sig  . amLODipine (NORVASC) 10 MG tablet TAKE ONE-HALF TABLET BY MOUTH ONCE DAILY  . ferrous sulfate 325 (65 FE) MG EC tablet Take 325 mg by mouth as needed.  Marland Kitchen HYDROcodone-acetaminophen (NORCO) 10-325 MG tablet Take 1 tablet by mouth every 6 (six) hours as needed. As needed moderate - severe pain  . meloxicam (MOBIC) 7.5 MG tablet Take 1 tablet (7.5 mg total) by mouth daily.  . metoprolol-hydrochlorothiazide (LOPRESSOR HCT) 50-25 MG tablet Take 1 tablet by mouth daily. FOR BLOOD PRESSURE  . PARoxetine (PAXIL) 20 MG tablet TAKE ONE TABLET BY MOUTH ONCE DAILY  . donepezil (ARICEPT) 10 MG tablet Take 1 tablet (10 mg total) by mouth at bedtime. (Patient not taking: Reported on 01/17/2017)  . lisinopril (PRINIVIL,ZESTRIL) 40 MG tablet TAKE 1 TABLET BY MOUTH ONCE DAILY (Patient not taking: Reported on 12/15/2016)  . [DISCONTINUED] ferrous sulfate 325 (65 FE) MG tablet Take 1 tablet (325 mg total) by mouth daily with breakfast.  . [DISCONTINUED] tiZANidine (ZANAFLEX) 4 MG tablet Take 0.5-1 tablets (2-4 mg total) by mouth every 8 (eight) hours as needed for muscle spasms. as needed  for muscle spasm (Patient not taking: Reported on 10/17/2016)    No facility-administered encounter medications on file as of 01/17/2017.     Review of Systems:  Review of Systems  Unable to perform ROS: Other (memory loss)    Health Maintenance  Topic Date Due  . TETANUS/TDAP  04/03/1964  . PNA vac Low Risk Adult (2 of 2 - PPSV23) 12/15/2016  . Hepatitis C Screening  01/17/2018 (Originally Jul 19, 1945)  . MAMMOGRAM  09/06/2018  . COLONOSCOPY  10/17/2021  . INFLUENZA VACCINE  Completed  . DEXA SCAN  Completed    Physical Exam: Vitals:   01/17/17 1348  BP: 136/88  Pulse: (!) 58  Temp: 97.9 F (36.6 C)  TempSrc: Oral  SpO2: 95%  Weight: 175 lb (79.4 kg)  Height: 5' 8" (1.727 m)   Body mass index is 26.61 kg/m. Physical Exam  Constitutional: She is oriented to person, place, and time. She appears well-developed and well-nourished.  HENT:  Mouth/Throat: Oropharynx is clear and moist and mucous membranes are normal. No oropharyngeal exudate.  MMM; no oral thrush; scar tissue noted inner upper lip  Eyes: Pupils are equal, round, and reactive to light. No scleral icterus.  Neck: Neck supple. Carotid bruit is not present. No tracheal deviation present. No thyromegaly present.  Cardiovascular: Normal rate, regular rhythm and intact distal pulses. Exam reveals no gallop and no friction rub.  Murmur (1/6 SEM) heard. No LE edema b/l. no calf TTP.   Pulmonary/Chest: Effort normal and breath sounds normal. No stridor. No respiratory distress. She has no wheezes. She has no rales.  Abdominal: Soft. Normal appearance and bowel sounds are normal. She exhibits no distension and no mass. There is no hepatomegaly. There is no tenderness. There is no rigidity, no rebound and no guarding. No hernia.  Musculoskeletal: She exhibits edema and tenderness.  Markedly reduced ROM right shoulder with swelling and TTP at [facility] joint and coracoid process. No deltoid tendon TTP  Lymphadenopathy:    She has no cervical adenopathy.  Neurological: She is alert and  oriented to person, place, and time.  Skin: Skin is warm and dry. No rash noted.  Hair thin but no bald spots  Psychiatric: She has a normal mood and affect. Her behavior is normal. Thought content normal.    Labs reviewed: Basic Metabolic Panel: Recent Labs    05/16/16 1652  NA 144  K 3.7  CL 108  CO2 28  GLUCOSE 99  BUN 16  CREATININE 0.80  CALCIUM 9.3   Liver Function Tests: No results for input(s): AST, ALT, ALKPHOS, BILITOT, PROT, ALBUMIN in the last 8760 hours. No results for input(s): LIPASE, AMYLASE in the last 8760 hours. No results for input(s): AMMONIA in the last 8760 hours. CBC: Recent Labs    05/16/16 1652  WBC 6.7  NEUTROABS 4,087  HGB 11.5*  HCT 37.1  MCV 78.4*  PLT 342   Lipid Panel: No results for input(s): CHOL, HDL, LDLCALC, TRIG, CHOLHDL, LDLDIRECT in the last 8760 hours. No results found for: HGBA1C  Procedures since last visit: No results found.  Assessment/Plan   ICD-10-CM   1. Memory loss or impairment - worse R41.3   2. Right shoulder pain, unspecified chronicity M25.511 DG Shoulder Right  3. Chronic back pain greater than 3 months duration M54.9    G89.29   4. Essential hypertension I10   5. Fall, initial encounter W19.XXXA DG Shoulder Right    Td : Tetanus/diphtheria >7yo Preservative  free  6.  High risk medication use Z79.899 CMP with eGFR  7. 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 Lip laceration, initial encounter S01.511A Td : Tetanus/diphtheria >7yo Preservative  free  9. Need for tetanus booster Z23 Td : Tetanus/diphtheria >7yo Preservative  free   TAKE ARICEPT (DONEPIZIL) 1/2 TABLET AT BEDTIME X 4 WEEKS THEN INCREASE TO 1 TABLET AT BEDTIME FOR MEMORY LOSS  Will call with lab results and imaging results  Continue other medications as ordered  Follow up in 3 mos for memory loss, HTN, mood d/o, chronic pain syndrome      S. Perlie Gold  [facility] [address] [place],  [place] [address] [phone] Cell (Monday-Friday 8 AM - 5 PM) [phone] After 5 PM and follow prompts

## 2017-01-18 LAB — TSH: TSH: 0.87 mIU/L (ref 0.40–4.50)

## 2017-01-21 ENCOUNTER — Encounter: Payer: Self-pay | Admitting: Internal Medicine

## 2017-01-21 DIAGNOSIS — G8929 Other chronic pain: Secondary | ICD-10-CM | POA: Insufficient documentation

## 2017-01-21 DIAGNOSIS — S01511A Laceration without foreign body of lip, initial encounter: Secondary | ICD-10-CM | POA: Insufficient documentation

## 2017-01-21 DIAGNOSIS — Z79899 Other long term (current) drug therapy: Secondary | ICD-10-CM | POA: Insufficient documentation

## 2017-01-21 DIAGNOSIS — W19XXXA Unspecified fall, initial encounter: Secondary | ICD-10-CM | POA: Insufficient documentation

## 2017-01-21 DIAGNOSIS — M549 Dorsalgia, unspecified: Secondary | ICD-10-CM

## 2017-01-21 DIAGNOSIS — L659 Nonscarring hair loss, unspecified: Secondary | ICD-10-CM | POA: Insufficient documentation

## 2017-01-25 DIAGNOSIS — I10 Essential (primary) hypertension: Secondary | ICD-10-CM | POA: Diagnosis not present

## 2017-01-25 DIAGNOSIS — M5416 Radiculopathy, lumbar region: Secondary | ICD-10-CM | POA: Diagnosis not present

## 2017-01-25 DIAGNOSIS — M48062 Spinal stenosis, lumbar region with neurogenic claudication: Secondary | ICD-10-CM | POA: Diagnosis not present

## 2017-01-31 ENCOUNTER — Other Ambulatory Visit: Payer: Self-pay | Admitting: Internal Medicine

## 2017-01-31 DIAGNOSIS — F4329 Adjustment disorder with other symptoms: Secondary | ICD-10-CM

## 2017-02-17 ENCOUNTER — Encounter (HOSPITAL_COMMUNITY): Payer: Self-pay | Admitting: Emergency Medicine

## 2017-02-17 ENCOUNTER — Other Ambulatory Visit: Payer: Self-pay

## 2017-02-17 ENCOUNTER — Emergency Department (HOSPITAL_COMMUNITY)
Admission: EM | Admit: 2017-02-17 | Discharge: 2017-02-17 | Disposition: A | Discharge status: Home / Self Care | Payer: Medicare Other | Attending: Emergency Medicine | Admitting: Emergency Medicine

## 2017-02-17 DIAGNOSIS — I10 Essential (primary) hypertension: Secondary | ICD-10-CM | POA: Diagnosis not present

## 2017-02-17 DIAGNOSIS — M791 Myalgia, unspecified site: Secondary | ICD-10-CM | POA: Diagnosis present

## 2017-02-17 DIAGNOSIS — J111 Influenza due to unidentified influenza virus with other respiratory manifestations: Secondary | ICD-10-CM | POA: Diagnosis not present

## 2017-02-17 DIAGNOSIS — Z79899 Other long term (current) drug therapy: Secondary | ICD-10-CM | POA: Diagnosis not present

## 2017-02-17 DIAGNOSIS — R69 Illness, unspecified: Secondary | ICD-10-CM

## 2017-02-17 MED ORDER — OSELTAMIVIR PHOSPHATE 75 MG PO CAPS: 75.0000 mg | ORAL_CAPSULE | Freq: Two times a day (BID) | ORAL | 0 refills | Status: DC

## 2017-02-17 MED ORDER — NAPROXEN 500 MG PO TABS: 500.0000 mg | ORAL_TABLET | Freq: Two times a day (BID) | ORAL | 0 refills | Status: DC

## 2017-02-17 MED ORDER — DM-GUAIFENESIN ER 30-600 MG PO TB12: 1.0000 | ORAL_TABLET | Freq: Two times a day (BID) | ORAL | 1 refills | Status: DC

## 2017-02-17 NOTE — ED Triage Notes (Signed)
Pt c/o of cough, sore throat, congestion with headache since yesterday. Pt states her grandchildren had same symptoms.

## 2017-02-17 NOTE — ED Notes (Signed)
Pt with fever last night, dry cough and runny nose since last night.

## 2017-02-17 NOTE — Discharge Instructions (Signed)
Symptoms seem to be consistent with influenza.  Take the Tamiflu as directed.  Also recommend taking Mucinex DM for cough and congestion this will not interfere with your blood pressure.  And if you are able to take medicines like Motrin then would recommend taking the prescription of Naprosyn because it last for 12 hours for the body aches.  Recommend plenty of rest and drinking plenty of fluids.  Return for any new or worse symptoms.

## 2017-02-17 NOTE — ED Notes (Signed)
Pt states she took a Goody powder this morning which has helped with HA and body aches per pt.

## 2017-02-17 NOTE — ED Provider Notes (Signed)
[facility] EMERGENCY DEPARTMENT Provider Note   CSN: [id number] Arrival date & time: 02/17/17  1432     History   Chief Complaint Chief Complaint  Patient presents with  . Cough    HPI Kaylee Warner is a 72 y.o. female.  Patient with onset of flulike symptoms on Wednesday evening.  Patient had exposure through nephews.  However she did have the flu vaccine this year.  Patient's symptoms are body aches chills headache cough and congestion no nausea vomiting or diarrhea.  Patient's been taking Goody powders.  She was taking Alka-Seltzer plus but her pharmacy did recommend she not take that due to having history of  high blood pressure.      Past Medical History:  Diagnosis Date  . Allergy   . Anemia   . Arthritis   . Hypertension     Patient Active Problem List   Diagnosis Date Noted  . Chronic back pain greater than 3 months duration 01/21/2017  . Fall 01/21/2017  . High risk medication use 01/21/2017  . Alopecia 01/21/2017  . Lip laceration 01/21/2017  . Seasonal allergic rhinitis due to pollen 08/30/2016  . Essential hypertension 09/07/2015  . Edema 05/09/2013  . PTTD (posterior tibial tendon dysfunction) 05/09/2013  . OA (osteoarthritis) of knee 05/09/2013  . Hematoma of leg 10/04/2012  . Chronic pain syndrome 10/04/2012  . 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 Knee pain 03/02/2011  . Rotator cuff syndrome of right shoulder 06/22/2010  . SPINAL STENOSIS 01/30/2008  . Lumbar pain with radiation down right leg 01/30/2008  . SCIATICA 01/30/2008  . SPONDYLOLYSIS 01/30/2008  . SHOULDER PAIN 11/28/2007  . IMPINGEMENT SYNDROME 11/28/2007  . DEEP VENOUS THROMBOPHLEBITIS 09/24/2007  . DERANGEMENT OF ANTERIOR HORN OF LATERAL MENISCUS 06/26/2007  . LOWER LEG, ARTHRITIS, DEGEN./OSTEO 05/24/2007  . DERANGEMENT MENISCUS 05/24/2007  . JOINT EFFUSION, RIGHT KNEE 05/24/2007  . KNEE PAIN 05/24/2007    Past Surgical History:  Procedure Laterality Date  . COLONOSCOPY     . JOINT REPLACEMENT  2008  . SARK 2009     Dr. Jolee Ewing     OB History    Gravida Para Term Preterm AB Living   4 4 4          SAB TAB Ectopic Multiple Live Births                   Home Medications    Prior to Admission medications   Medication Sig Start Date End Date Taking? Authorizing Provider  amLODipine (NORVASC) 10 MG tablet TAKE ONE-HALF TABLET BY MOUTH ONCE DAILY 03/22/16   Gildardo Cranker, DO  dextromethorphan-guaiFENesin [facility] DM) 30-600 MG 12hr tablet Take 1 tablet by mouth 2 (two) times daily. 02/17/17   Fredia Sorrow, MD  donepezil (ARICEPT) 10 MG tablet Take 1 tablet (10 mg total) by mouth at bedtime. Patient not taking: Reported on 01/17/2017 06/24/16   Gildardo Cranker, DO  ferrous sulfate 325 (65 FE) MG EC tablet Take 325 mg by mouth as needed.    [provider]  HYDROcodone-acetaminophen (NORCO) 10-325 MG tablet Take 1 tablet by mouth every 6 (six) hours as needed. As needed moderate - severe pain 12/15/16   Lauree Chandler, NP  lisinopril (PRINIVIL,ZESTRIL) 40 MG tablet TAKE 1 TABLET BY MOUTH ONCE DAILY Patient not taking: Reported on 12/15/2016 10/21/16   Gildardo Cranker, DO  meloxicam (MOBIC) 7.5 MG tablet Take 1 tablet (7.5 mg total) by mouth daily. 02/24/16   Gildardo Cranker, DO  metoprolol-hydrochlorothiazide (LOPRESSOR HCT) 50-25 MG tablet Take 1 tablet by mouth daily. FOR BLOOD PRESSURE 08/30/16   Gildardo Cranker, DO  naproxen (NAPROSYN) 500 MG tablet Take 1 tablet (500 mg total) by mouth 2 (two) times daily. 02/17/17   Fredia Sorrow, MD  oseltamivir (TAMIFLU) 75 MG capsule Take 1 capsule (75 mg total) by mouth every 12 (twelve) hours. 02/17/17   Fredia Sorrow, MD  PARoxetine (PAXIL) 20 MG tablet TAKE 1 TABLET BY MOUTH ONCE DAILY 01/31/17   Gildardo Cranker, DO    Family History Family History  Problem Relation Age of Onset  . Diabetes Sister   . Sjogren's syndrome Daughter   . Diabetes Son   . Hypertension Son   . Diabetes Son   . Diabetes  Unknown        family history   . Arthritis Unknown        family history   . Colon polyps Neg Hx   . Colon cancer Neg Hx   . Esophageal cancer Neg Hx   . Stomach cancer Neg Hx   . Rectal cancer Neg Hx     Social History Social History   Tobacco Use  . Smoking status: Never Smoker  . Smokeless tobacco: Never Used  Substance Use Topics  . Alcohol use: Yes    Alcohol/week: 4.2 oz    Types: 7 Glasses of wine per week    Comment: 1 Glass a day   . Drug use: No     Allergies   Celecoxib; Codeine; Fexofenadine; and Penicillins   Review of Systems Review of Systems  Constitutional: Positive for chills, fatigue and fever.  HENT: Positive for congestion. Negative for trouble swallowing.   Eyes: Negative for redness.  Respiratory: Positive for cough.   Cardiovascular: Negative for chest pain.  Gastrointestinal: Negative for abdominal pain, diarrhea, nausea and vomiting.  Genitourinary: Negative for dysuria.  Musculoskeletal: Positive for myalgias.  Skin: Negative for rash.  Neurological: Positive for headaches.  Hematological: Does not bruise/bleed easily.  Psychiatric/Behavioral: Negative for confusion.     Physical Exam Updated Vital Signs BP (!) 141/87   Pulse 76   Temp 99.6 F (37.6 C) (Oral)   Resp (!) 22   Ht 1.753 m (5\' 9" )   Wt 77.1 kg (170 lb)   SpO2 98%   BMI 25.10 kg/m   Physical Exam  Constitutional: She is oriented to person, place, and time. She appears well-developed and well-nourished. No distress.  HENT:  Head: Normocephalic and atraumatic.  Mouth/Throat: Oropharynx is clear and moist. No oropharyngeal exudate.  Eyes: Conjunctivae and EOM are normal. Pupils are equal, round, and reactive to light.  Neck: Neck supple.  Cardiovascular: Normal rate, regular rhythm and normal heart sounds.  Pulmonary/Chest: Effort normal and breath sounds normal. No respiratory distress. She has no wheezes.  Abdominal: Soft. Bowel sounds are normal. There is no  tenderness.  Musculoskeletal: Normal range of motion.  Neurological: She is alert and oriented to person, place, and time. No cranial nerve deficit or sensory deficit. She exhibits normal muscle tone. Coordination normal.  Skin: Skin is warm.  Nursing note and vitals reviewed.    ED Treatments / Results  Labs (all labs ordered are listed, but only abnormal results are displayed) Labs Reviewed - No data to display  EKG  EKG Interpretation None       Radiology No results found.  Procedures Procedures (including critical care time)  Medications Ordered in ED Medications - No data to display  Initial Impression / Assessment and Plan / ED Course  I have reviewed the triage vital signs and the nursing notes.  Pertinent labs & imaging results that were available during my care of the patient were reviewed by me and considered in my medical decision making (see chart for details).    Patient symptoms highly suggestive of flu.  However she did have the flu vaccine.  She is in the 48-hour window making treatment with Tamiflu appropriate.  Patient nontoxic no acute distress.  Will treat symptomatically.  Prescriptions given for Tamiflu Naprosyn and Mucinex DM.  Patient has been taking Goody powders.  And is able to take Motrin according to her.  Final Clinical Impressions(s) / ED Diagnoses   Final diagnoses:  Influenza-like illness    ED Discharge Orders        Ordered    oseltamivir (TAMIFLU) 75 MG capsule  Every 12 hours     02/17/17 1551    dextromethorphan-guaiFENesin (MUCINEX DM) 30-600 MG 12hr tablet  2 times daily     02/17/17 1551    naproxen (NAPROSYN) 500 MG tablet  2 times daily     02/17/17 1551       Fredia Sorrow, MD 02/17/17 1557

## 2017-02-20 ENCOUNTER — Other Ambulatory Visit: Payer: Self-pay

## 2017-03-08 ENCOUNTER — Encounter: Payer: Self-pay | Admitting: Internal Medicine

## 2017-03-08 ENCOUNTER — Ambulatory Visit: Payer: Medicare Other | Admitting: Internal Medicine

## 2017-03-08 VITALS — BP 132/78 | HR 61 | Temp 98.1°F | Ht 68.0 in | Wt 174.0 lb

## 2017-03-08 DIAGNOSIS — J301 Allergic rhinitis due to pollen: Secondary | ICD-10-CM

## 2017-03-08 DIAGNOSIS — J22 Unspecified acute lower respiratory infection: Secondary | ICD-10-CM

## 2017-03-08 DIAGNOSIS — R413 Other amnesia: Secondary | ICD-10-CM

## 2017-03-08 DIAGNOSIS — M79604 Pain in right leg: Secondary | ICD-10-CM

## 2017-03-08 DIAGNOSIS — M545 Low back pain: Secondary | ICD-10-CM

## 2017-03-08 MED ORDER — METHYLPREDNISOLONE ACETATE 40 MG/ML IJ SUSP
40.0000 mg | Freq: Once | INTRAMUSCULAR | Status: AC
  Administered 2017-03-08: 40 mg via INTRAMUSCULAR

## 2017-03-08 MED ORDER — HYDROCODONE-ACETAMINOPHEN 10-325 MG PO TABS: 1.0000 | ORAL_TABLET | Freq: Four times a day (QID) | ORAL | 0 refills | Status: DC | PRN

## 2017-03-08 MED ORDER — AZITHROMYCIN 250 MG PO TABS: ORAL_TABLET | ORAL | 0 refills | Status: DC

## 2017-03-08 NOTE — Progress Notes (Signed)
Patient ID: Kaylee Warner, female   DOB: August 09, 1945, 72 y.o.   MRN: [id number]   [facility] OFFICE  Provider: DR Arletha Grippe  Code Status:  Goals of Care:  Advanced Directives 02/17/2017  Does Patient Have a Medical Advance Directive? No  Type of Advance Directive -  Copy of [place] in Chart? -  Would patient like information on creating a medical advance directive? -     Chief Complaint  Patient presents with  . Acute Visit    Ongoing drainage and body aches since 02/17/17 (patient was diagnoised with fl)   . Medication Refill    Refill pain medication     HPI: Patient is a 72 y.o. female seen today for an acute visit for URI sx's. She was dx influenza like illness on 02/17/17 by ED. She was Rx Tamiflu, Mucinex DM and naprosyn. She completed all tx. She continues to have generalized aches, dry cough, clear post nasal drip. No f/c, dizziness. She has chronic HA. No CP, SOB. She has wheezing when lying supine. No N/V, change in appetite, sleep disturbance. She is a poor historian due to memory loss. Hx obtained from chart. She is now taking her aricept and reports feeling better.  Past Medical History:  Diagnosis Date  . Allergy   . Anemia   . Arthritis   . Hypertension     Past Surgical History:  Procedure Laterality Date  . COLONOSCOPY    . JOINT REPLACEMENT  2008  . SARK 2009     Dr. Jolee Ewing      reports that  has never smoked. she has never used smokeless tobacco. She reports that she drinks about 4.2 oz of alcohol per week. She reports that she does not use drugs. Social History   Socioeconomic History  . Marital status: Married    Spouse name: Not on file  . Number of children: Not on file  . Years of education: Not on file  . Highest education level: Not on file  Social Needs  . Financial resource strain: Not hard at all  . Food insecurity - worry: Never true  . Food insecurity - inability: Never true  . Transportation needs - medical: No  .  Transportation needs - non-medical: No  Occupational History  . Occupation: cook   Tobacco Use  . Smoking status: Never Smoker  . Smokeless tobacco: Never Used  Substance and Sexual Activity  . Alcohol use: Yes    Alcohol/week: 4.2 oz    Types: 7 Glasses of wine per week    Comment: 1 Glass a day   . Drug use: No  . Sexual activity: No  Other Topics Concern  . Not on file  Social History Narrative   Diet:Regular   Do you drink/eat things with caffeine? Yes   Marital status:  Married                             What year were you married?    Do you live in a house, apartment, assisted living, condo, trailer, etc)? House   Is it one or more stories? 2   How many persons live in your home? 2  Do you have any pets in your home? No   Current or past profession: Regulatory affairs officer   Do you exercise?   Yes  Type & how often: stretches   Do you have a living will? No    Do you have a DNR Form?  No   Do you have a POA/HPOA forms? No    Family History  Problem Relation Age of Onset  . Diabetes Sister   . Sjogren's syndrome Daughter   . Diabetes Son   . Hypertension Son   . Diabetes Son   . Diabetes Unknown        family history   . Arthritis Unknown        family history   . Colon polyps Neg Hx   . Colon cancer Neg Hx   . Esophageal cancer Neg Hx   . Stomach cancer Neg Hx   . Rectal cancer Neg Hx     Allergies  Allergen Reactions  . Celecoxib     REACTION: blood pressure went up  . Codeine Itching  . Fexofenadine Other (See Comments)    unknown  . Penicillins Rash    Outpatient Encounter Medications as of 03/08/2017  Medication Sig  . amLODipine (NORVASC) 10 MG tablet TAKE ONE-HALF TABLET BY MOUTH ONCE DAILY  . donepezil (ARICEPT) 10 MG tablet Take 1 tablet (10 mg total) by mouth at bedtime.  Marland Kitchen HYDROcodone-acetaminophen (NORCO) 10-325 MG tablet Take 1 tablet by mouth every 6 (six) hours as needed. As needed moderate -  severe pain  . lisinopril (PRINIVIL,ZESTRIL) 40 MG tablet TAKE 1 TABLET BY MOUTH ONCE DAILY  . meloxicam (MOBIC) 7.5 MG tablet Take 1 tablet (7.5 mg total) by mouth daily.  . metoprolol-hydrochlorothiazide (LOPRESSOR HCT) 50-25 MG tablet Take 1 tablet by mouth daily. FOR BLOOD PRESSURE  . PARoxetine (PAXIL) 20 MG tablet TAKE 1 TABLET BY MOUTH ONCE DAILY  . [DISCONTINUED] dextromethorphan-guaiFENesin (MUCINEX DM) 30-600 MG 12hr tablet Take 1 tablet by mouth 2 (two) times daily. (Patient not taking: Reported on 03/08/2017)  . [DISCONTINUED] ferrous sulfate 325 (65 FE) MG EC tablet Take 325 mg by mouth as needed.  . [DISCONTINUED] naproxen (NAPROSYN) 500 MG tablet Take 1 tablet (500 mg total) by mouth 2 (two) times daily. (Patient not taking: Reported on 03/08/2017)  . [DISCONTINUED] oseltamivir (TAMIFLU) 75 MG capsule Take 1 capsule (75 mg total) by mouth every 12 (twelve) hours. (Patient not taking: Reported on 03/08/2017)   No facility-administered encounter medications on file as of 03/08/2017.     Review of Systems:  Review of Systems  Unable to perform ROS: Other (memory loss)    Health Maintenance  Topic Date Due  . PNA vac Low Risk Adult (2 of 2 - PPSV23) 12/15/2016  . Hepatitis C Screening  01/17/2018 (Originally [birth year])  . MAMMOGRAM  09/06/2018  . COLONOSCOPY  10/17/2021  . TETANUS/TDAP  01/18/2027  . INFLUENZA VACCINE  Completed  . DEXA SCAN  Completed    Physical Exam: Vitals:   03/08/17 1358  BP: 132/78  Pulse: 61  Temp: 98.1 F (36.7 C)  TempSrc: Oral  SpO2: 97%  Weight: 174 lb (78.9 kg)  Height: 5\' 8"  (1.727 m)   Body mass index is 26.46 kg/m. Physical Exam  Constitutional: She appears well-developed and well-nourished.  Looks ill in NAD, no conversational dyspnea  HENT:  TMs appear nml, no sinus TTP; nares with enlarged grey turbinates; oropharynx cobblestoning and redness but no exudate  Eyes: No scleral icterus.  Neck: Neck supple.  Cardiovascular:  Normal rate and regular rhythm. Exam reveals no gallop and no friction rub.  Murmur (1/6 SEM) heard. No  LE edema b/l. No calf TTP  Pulmonary/Chest: Effort normal. No respiratory distress. She has decreased breath sounds (left base). She has no wheezes. She has no rhonchi. She has no rales. She exhibits no tenderness.  Musculoskeletal: She exhibits edema.  Lymphadenopathy:    She has cervical adenopathy.  Neurological: She is alert.  Skin: Skin is warm and dry. No rash noted.  Psychiatric: She has a normal mood and affect. Her behavior is normal. Thought content normal.    Labs reviewed: Basic Metabolic Panel: Recent Labs    05/16/16 1652 01/17/17 1520  NA 144 138  K 3.7 4.1  CL 108 105  CO2 28 26  GLUCOSE 99 85  BUN 16 11  CREATININE 0.80 0.69  CALCIUM 9.3 9.4  TSH  --  0.87   Liver Function Tests: Recent Labs    01/17/17 1520  AST 14  ALT 11  BILITOT 0.5  PROT 6.5   No results for input(s): LIPASE, AMYLASE in the last 8760 hours. No results for input(s): AMMONIA in the last 8760 hours. CBC: Recent Labs    05/16/16 1652  WBC 6.7  NEUTROABS 4,087  HGB 11.5*  HCT 37.1  MCV 78.4*  PLT 342   Lipid Panel: No results for input(s): CHOL, HDL, LDLCALC, TRIG, CHOLHDL, LDLDIRECT in the last 8760 hours. No results found for: HGBA1C  Procedures since last visit: No results found.  Assessment/Plan   ICD-10-CM   1. Lower respiratory infection J22 azithromycin (ZITHROMAX) 250 MG tablet    methylPREDNISolone acetate (DEPO-MEDROL) injection 40 mg  2. Lumbar pain with radiation down right leg M54.5 HYDROcodone-acetaminophen (NORCO) 10-325 MG tablet  3. Seasonal allergic rhinitis due to pollen J30.1   4. Memory loss or impairment R41.3    START AZITHROMYCIN AS DIRECTED  Take probiotic (yogurt, culturelle, floraster) daily while on antibiotic  Depo-medrol 40mg  injection given today  Push fluids and rest  Continue other medications as ordered  Follow up as  scheduled  Sue Mcalexander S. Perlie Gold  [facility] [address] [place], [place] [address] [phone] Cell (Monday-Friday 8 AM - 5 PM) [phone] After 5 PM and follow prompts

## 2017-03-08 NOTE — Patient Instructions (Addendum)
START AZITHROMYCIN AS DIRECTED  Take probiotic (yogurt, culturelle, floraster) daily while on antibiotic  Depo-medrol 40mg  injection given today  Push fluids and rest  Continue other medications as ordered  Follow up as scheduled

## 2017-04-18 ENCOUNTER — Ambulatory Visit: Payer: Medicare Other | Admitting: Internal Medicine

## 2017-04-18 ENCOUNTER — Encounter: Payer: Self-pay | Admitting: Internal Medicine

## 2017-04-18 VITALS — BP 112/70 | HR 64 | Temp 98.0°F | Ht 68.0 in | Wt 170.4 lb

## 2017-04-18 DIAGNOSIS — G894 Chronic pain syndrome: Secondary | ICD-10-CM | POA: Diagnosis not present

## 2017-04-18 DIAGNOSIS — Z7189 Other specified counseling: Secondary | ICD-10-CM | POA: Diagnosis not present

## 2017-04-18 DIAGNOSIS — R413 Other amnesia: Secondary | ICD-10-CM | POA: Diagnosis not present

## 2017-04-18 DIAGNOSIS — I10 Essential (primary) hypertension: Secondary | ICD-10-CM | POA: Diagnosis not present

## 2017-04-18 DIAGNOSIS — M545 Low back pain: Secondary | ICD-10-CM | POA: Diagnosis not present

## 2017-04-18 DIAGNOSIS — M79604 Pain in right leg: Secondary | ICD-10-CM

## 2017-04-18 NOTE — Progress Notes (Signed)
Patient ID: Kaylee Warner, female   DOB: [birth year], 72 y.o.   MRN: [id number]   Location:  [facility] OFFICE  Provider: DR Arletha Grippe  Code Status:  Goals of Care:  Advanced Directives 02/17/2017  Does Patient Have a Medical Advance Directive? No  Type of Advance Directive -  Copy of [place] in Chart? -  Would patient like information on creating a medical advance directive? -     Chief Complaint  Patient presents with  . Medical Management of Chronic Issues    3 month follow-up on memory, HTN, mood disorder, and chronic pain syndrome. RX request for TENS   . Advance Care Planning  . Medication Refill    No refills needed   . Medication Management    Stopped Metoprolol-HCT on her own, patient felt like b/p controlled without medication     HPI: Patient is a 72 y.o. female seen today for medical management of chronic diseases.  She plans to start using hemp pdts to better control pain. She stopped taking metoprolol HCT on her own. She states she ran out of med in bottle and pharmacy never called to have it refilled.  Chronic pain syndrome - She is still working as much as she can. Pain severe in lower back. She had an epidural injection on July 05, 2016 with Dr Maryruth Eve. No radiation of pain at this time.  Seasonal allergy - takes allegra daily prn  Left buccal lichen planus - she saw ENT Dr Redmond Baseman in Aug 2018 and he dx left cheek lesion as lichen planus. No bx recommended as she did not have sx's. Her dentist is also following the area.  Mood - stable on paxil. She does benefit from this regimen. In the past, switched from cymbalta to sertraline due to cost. She finds that it does not help her self-esteem. celexa ineffective.   HTN - BP stable on amlodipine, lisinopril. She stopped taking metoprolol hct on her own several mos ago.  Memory loss - MMSE 30/30 (previous 28/30). She was taking vayacog rx by previous PCP but too expensive. Memory has improved on  aricept and she is also taking prevagen. She has difficulty remembering names, schedule and conversations. She does not remember when she fell last yr.  Weight down 5 lbs since Jan 2019   Past Medical History:  Diagnosis Date  . Allergy   . Anemia   . Arthritis   . Hypertension     Past Surgical History:  Procedure Laterality Date  . COLONOSCOPY    . JOINT REPLACEMENT  2008  . SARK 2009     Dr. Jolee Ewing      reports that she has never smoked. She has never used smokeless tobacco. She reports that she drinks about 4.2 oz of alcohol per week. She reports that she does not use drugs. Social History   Socioeconomic History  . Marital status: Married    Spouse name: Not on file  . Number of children: Not on file  . Years of education: Not on file  . Highest education level: Not on file  Occupational History  . Occupation: Training and development officer   Social Needs  . Financial resource strain: Not hard at all  . Food insecurity:    Worry: Never true    Inability: Never true  . Transportation needs:    Medical: No    Non-medical: No  Tobacco Use  . Smoking status: Never Smoker  . Smokeless tobacco: Never Used  Substance and Sexual Activity  . Alcohol use: Yes    Alcohol/week: 4.2 oz    Types: 7 Glasses of wine per week    Comment: 1 Glass a day   . Drug use: No  . Sexual activity: Never  Lifestyle  . Physical activity:    Days per week: 1 day    Minutes per session: 30 min  . Stress: To some extent  Relationships  . Social connections:    Talks on phone: More than three times a week    Gets together: More than three times a week    Attends religious service: 1 to 4 times per year    Active member of club or organization: Yes    Attends meetings of clubs or organizations: 1 to 4 times per year    Relationship status: Married  . Intimate partner violence:    Fear of current or ex partner: No    Emotionally abused: No    Physically abused: No    Forced sexual activity: No  Other  Topics Concern  . Not on file  Social History Narrative   Diet:Regular   Do you drink/eat things with caffeine? Yes   Marital status:  Married                             What year were you married?    Do you live in a house, apartment, assisted living, condo, trailer, etc)? House   Is it one or more stories? 2   How many persons live in your home? 2  Do you have any pets in your home? No   Current or past profession: Regulatory affairs officer   Do you exercise?   Yes                                                  Type & how often: stretches   Do you have a living will? No    Do you have a DNR Form?  No   Do you have a POA/HPOA forms? No    Family History  Problem Relation Age of Onset  . Diabetes Sister   . Sjogren's syndrome Daughter   . Diabetes Son   . Hypertension Son   . Diabetes Son   . Diabetes Unknown        family history   . Arthritis Unknown        family history   . Colon polyps Neg Hx   . Colon cancer Neg Hx   . Esophageal cancer Neg Hx   . Stomach cancer Neg Hx   . Rectal cancer Neg Hx     Allergies  Allergen Reactions  . Celecoxib     REACTION: blood pressure went up  . Codeine Itching  . Fexofenadine Other (See Comments)    unknown  . Penicillins Rash    Outpatient Encounter Medications as of 04/18/2017  Medication Sig  . donepezil (ARICEPT) 10 MG tablet Take 1 tablet (10 mg total) by mouth at bedtime.  Marland Kitchen HYDROcodone-acetaminophen (NORCO) 10-325 MG tablet Take 1 tablet by mouth every 6 (six) hours as needed. As needed moderate - severe pain  . lisinopril (PRINIVIL,ZESTRIL) 40 MG tablet TAKE 1 TABLET BY MOUTH ONCE DAILY  . meloxicam (MOBIC) 7.5 MG tablet Take 1  tablet (7.5 mg total) by mouth daily.  . metoprolol-hydrochlorothiazide (LOPRESSOR HCT) 50-25 MG tablet Take 1 tablet by mouth daily. FOR BLOOD PRESSURE  . PARoxetine (PAXIL) 20 MG tablet TAKE 1 TABLET BY MOUTH ONCE DAILY  . amLODipine (NORVASC) 10 MG tablet TAKE ONE-HALF TABLET BY MOUTH ONCE DAILY  (Patient not taking: Reported on 04/18/2017)  . [DISCONTINUED] azithromycin (ZITHROMAX) 250 MG tablet Take 2 tabs po on day 1 then 1 tab po daily x 4 days for lung infection (Patient not taking: Reported on 04/18/2017)   No facility-administered encounter medications on file as of 04/18/2017.     Review of Systems:  Review of Systems  Unable to perform ROS: Other (memory loss)    Health Maintenance  Topic Date Due  . PNA vac Low Risk Adult (2 of 2 - PPSV23) 12/15/2016  . Hepatitis C Screening  01/17/2018 (Originally January 05, 1946)  . INFLUENZA VACCINE  08/10/2017  . MAMMOGRAM  09/06/2018  . COLONOSCOPY  10/17/2021  . TETANUS/TDAP  01/18/2027  . DEXA SCAN  Completed    Physical Exam: Vitals:   04/18/17 1419  BP: 112/70  Pulse: 64  Temp: 98 F (36.7 C)  TempSrc: Oral  SpO2: 95%  Weight: 170 lb 6.4 oz (77.3 kg)  Height: 5\' 8"  (1.727 m)   Body mass index is 25.91 kg/m. Physical Exam  Constitutional: She appears well-developed and well-nourished.  HENT:  Mouth/Throat: Oropharynx is clear and moist. No oropharyngeal exudate.  MMM; no oral thrush  Eyes: Pupils are equal, round, and reactive to light. No scleral icterus.  Neck: Neck supple. Carotid bruit is not present. No tracheal deviation present.  Cardiovascular: Normal rate, regular rhythm and intact distal pulses. Exam reveals no gallop and no friction rub.  Murmur (1/6 SEM) heard. No LE edema b/l. no calf TTP.   Pulmonary/Chest: Effort normal and breath sounds normal. No stridor. No respiratory distress. She has no wheezes. She has no rales.  Abdominal: Soft. Normal appearance and bowel sounds are normal. She exhibits no distension and no mass. There is no hepatomegaly. There is no tenderness. There is no rigidity, no rebound and no guarding. No hernia.  obese  Musculoskeletal: She exhibits edema.  Lymphadenopathy:    She has no cervical adenopathy.  Neurological: She is alert.  Skin: Skin is warm and dry. No rash noted.    Psychiatric: She has a normal mood and affect. Her behavior is normal. Thought content normal.    Labs reviewed: Basic Metabolic Panel: Recent Labs    05/16/16 1652 01/17/17 1520  NA 144 138  K 3.7 4.1  CL 108 105  CO2 28 26  GLUCOSE 99 85  BUN 16 11  CREATININE 0.80 0.69  CALCIUM 9.3 9.4  TSH  --  0.87   Liver Function Tests: Recent Labs    01/17/17 1520  AST 14  ALT 11  BILITOT 0.5  PROT 6.5   No results for input(s): LIPASE, AMYLASE in the last 8760 hours. No results for input(s): AMMONIA in the last 8760 hours. CBC: Recent Labs    05/16/16 1652  WBC 6.7  NEUTROABS 4,087  HGB 11.5*  HCT 37.1  MCV 78.4*  PLT 342   Lipid Panel: No results for input(s): CHOL, HDL, LDLCALC, TRIG, CHOLHDL, LDLDIRECT in the last 8760 hours. No results found for: HGBA1C  Procedures since last visit: No results found.  Assessment/Plan   ICD-10-CM   1. Memory loss or impairment R41.3   2. Essential hypertension I10  3. Lumbar pain with radiation down right leg M54.5   4. Chronic pain syndrome G89.4   5. Advanced directives, counseling/discussion Z71.89    Advanced directives discussed - living will pamphlet given. She prefers FULL CODE (CPR, intubation) unless it is proven that she will not have a reasonable QOL. She does not desire a FT. She plans to get HCPOA. TIME SPENT: 20 min  Continue current medication as ordered  Follow up with specialists as scheduled  Follow up in 3 mos for memory loss, HTN, mood  Shaneil Yazdi S. Perlie Gold  [facility] [address] [place], [place] [address] [phone] Cell (Monday-Friday 8 AM - 5 PM) [phone] After 5 PM and follow prompts

## 2017-04-18 NOTE — Patient Instructions (Signed)
Continue current medication as ordered  Follow up with specialists as scheduled  Follow up in 3 mos for memory loss, HTN, mood, seasonal allergy

## 2017-05-09 DIAGNOSIS — M5416 Radiculopathy, lumbar region: Secondary | ICD-10-CM | POA: Diagnosis not present

## 2017-05-09 DIAGNOSIS — M48062 Spinal stenosis, lumbar region with neurogenic claudication: Secondary | ICD-10-CM | POA: Diagnosis not present

## 2017-05-15 ENCOUNTER — Encounter: Payer: Self-pay | Admitting: Nurse Practitioner

## 2017-05-15 ENCOUNTER — Ambulatory Visit: Payer: Medicare Other | Admitting: Nurse Practitioner

## 2017-05-15 ENCOUNTER — Ambulatory Visit (INDEPENDENT_AMBULATORY_CARE_PROVIDER_SITE_OTHER): Payer: Medicare Other | Admitting: Nurse Practitioner

## 2017-05-15 VITALS — BP 124/84 | HR 63 | Temp 98.1°F | Ht 68.0 in | Wt 172.0 lb

## 2017-05-15 DIAGNOSIS — R5383 Other fatigue: Secondary | ICD-10-CM | POA: Diagnosis not present

## 2017-05-15 DIAGNOSIS — M79604 Pain in right leg: Secondary | ICD-10-CM

## 2017-05-15 DIAGNOSIS — M545 Low back pain: Secondary | ICD-10-CM

## 2017-05-15 MED ORDER — HYDROCODONE-ACETAMINOPHEN 10-325 MG PO TABS: 1.0000 | ORAL_TABLET | Freq: Four times a day (QID) | ORAL | 0 refills | Status: DC | PRN

## 2017-05-15 NOTE — Patient Instructions (Signed)
Cut back on wine and quit drinking for best results Continue to drink water throughout the day but cut  back close to bedtime.

## 2017-05-15 NOTE — Progress Notes (Signed)
Careteam: Patient Care Team: Gildardo Cranker, DO as PCP - General (Internal Medicine)  Advanced Directive information Does Patient Have a Medical Advance Directive?: No, Would patient like information on creating a medical advance directive?: Yes (MAU/Ambulatory/Procedural Areas - Information given)  Allergies  Allergen Reactions  . Celecoxib     REACTION: blood pressure went up  . Codeine Itching  . Fexofenadine Other (See Comments)    unknown  . Penicillins Rash    Chief Complaint  Patient presents with  . Acute Visit    Pt would like to have something to help her sleep. She states she only sleeps about 5 hours per night for about a month.   . Medication Refill    Rx pended for hydrocodone and meloxicam. [place] database verified.      HPI: Patient is a 72 y.o. female seen in the office today due to not sleeping. States she is unsure why she was so late for her appt today, forgot appt time and arrived 45 mins late. Unsure what happened.  Reports she is not able to fall asleep or stay asleep. Sleeping about 5 hours every night.  Reports she sleeps off and on from 10 pm to 6 am.  Reports her mind wonders when she lays down- lays there for about 30 mins and then able to drift off to sleep.  Reports taking her memory pill, antidepressant and pain pill.  Reports she wakes up around 430 am.  Gets up about 3 times at night to go to the bathroom but able to go back to sleep.  Not very active during the day. pt with chronic pain syndrome and taking epidural injections with Dr Maryruth Eve with hydrocodone and meloxicam. Back pain inhibits a lot of activity.  Does not nap.  Does not lay in bed in the middle of the night unable to sleep.  Occasionally has nights that are worse, after back injection she was unable to sleep.  Pain does not keep her up.  Drinks coffee in the morning only.  Does not urinate a lot during the day. Drinks water throughout the day.  Drinks 2 full wine glasses every  night (half a bottle).  Review of Systems:  Review of Systems  Constitutional: Positive for malaise/fatigue. Negative for chills, fever and weight loss.  Respiratory: Negative for shortness of breath.   Cardiovascular: Negative for chest pain.  Genitourinary: Positive for frequency (at night).  Musculoskeletal: Positive for back pain.  Psychiatric/Behavioral: Positive for memory loss. The patient is nervous/anxious and has insomnia.     Past Medical History:  Diagnosis Date  . Allergy   . Anemia   . Arthritis   . Hypertension    Past Surgical History:  Procedure Laterality Date  . COLONOSCOPY    . JOINT REPLACEMENT  2008  . SARK 2009     Dr. Jolee Ewing    Social History:   reports that she has never smoked. She has never used smokeless tobacco. She reports that she drinks about 4.2 oz of alcohol per week. She reports that she does not use drugs.  Family History  Problem Relation Age of Onset  . Diabetes Sister   . Sjogren's syndrome Daughter   . Diabetes Son   . Hypertension Son   . Diabetes Son   . Diabetes Unknown        family history   . Arthritis Unknown        family history   . Colon polyps Neg Hx   .  Colon cancer Neg Hx   . Esophageal cancer Neg Hx   . Stomach cancer Neg Hx   . Rectal cancer Neg Hx     Medications: Patient's Medications  New Prescriptions   No medications on file  Previous Medications   AMLODIPINE (NORVASC) 10 MG TABLET    TAKE ONE-HALF TABLET BY MOUTH ONCE DAILY   DONEPEZIL (ARICEPT) 10 MG TABLET    Take 1 tablet (10 mg total) by mouth at bedtime.   HYDROCODONE-ACETAMINOPHEN (NORCO) 10-325 MG TABLET    Take 1 tablet by mouth every 6 (six) hours as needed. As needed moderate - severe pain   LISINOPRIL (PRINIVIL,ZESTRIL) 40 MG TABLET    TAKE 1 TABLET BY MOUTH ONCE DAILY   MELOXICAM (MOBIC) 7.5 MG TABLET    Take 1 tablet (7.5 mg total) by mouth daily.   PAROXETINE (PAXIL) 20 MG TABLET    TAKE 1 TABLET BY MOUTH ONCE DAILY  Modified  Medications   No medications on file  Discontinued Medications   No medications on file     Physical Exam:  Vitals:   05/15/17 1108  BP: 124/84  Pulse: 63  Temp: 98.1 F (36.7 C)  TempSrc: Oral  SpO2: 97%  Weight: 172 lb (78 kg)  Height: 5\' 8"  (1.727 m)   Body mass index is 26.15 kg/m.  Physical Exam  Constitutional: She is oriented to person, place, and time. She appears well-developed and well-nourished.  HENT:  Head: Normocephalic and atraumatic.  Cardiovascular: Normal rate, regular rhythm and normal heart sounds.  Pulmonary/Chest: Effort normal and breath sounds normal.  Abdominal: Soft. Bowel sounds are normal.  Neurological: She is alert and oriented to person, place, and time.  Skin: Skin is warm and dry.  Psychiatric: She has a normal mood and affect. Her behavior is normal. Thought content normal.    Labs reviewed: Basic Metabolic Panel: Recent Labs    05/16/16 1652 01/17/17 1520  NA 144 138  K 3.7 4.1  CL 108 105  CO2 28 26  GLUCOSE 99 85  BUN 16 11  CREATININE 0.80 0.69  CALCIUM 9.3 9.4  TSH  --  0.87   Liver Function Tests: Recent Labs    01/17/17 1520  AST 14  ALT 11  BILITOT 0.5  PROT 6.5   No results for input(s): LIPASE, AMYLASE in the last 8760 hours. No results for input(s): AMMONIA in the last 8760 hours. CBC: Recent Labs    05/16/16 1652  WBC 6.7  NEUTROABS 4,087  HGB 11.5*  HCT 37.1  MCV 78.4*  PLT 342   Lipid Panel: No results for input(s): CHOL, HDL, LDLCALC, TRIG, CHOLHDL, LDLDIRECT in the last 8760 hours. TSH: Recent Labs    01/17/17 1520  TSH 0.87   A1C: No results found for: HGBA1C   Assessment/Plan 1. Other fatigue Reports fatigue and yawning however sleeping from 10-6 am, has to use an alarm to get up. When further questioned she is drinking 2 full glasses of wine a night which most likely contributing to increase in nighttime urination and increased fatigue during the day. Also could be adding to her  memory loss. To cut back on alcohol intake to help with symptoms.   2. Lumbar pain with radiation down right leg -refill provided.  - HYDROcodone-acetaminophen (NORCO) 10-325 MG tablet; Take 1 tablet by mouth every 6 (six) hours as needed. As needed moderate - severe pain  Dispense: 120 tablet; Refill: 0  Next appt: 07/26/2017 Carlos American. Dewaine Oats,  [facility] [phone]

## 2017-05-27 ENCOUNTER — Other Ambulatory Visit: Payer: Self-pay | Admitting: Internal Medicine

## 2017-05-27 DIAGNOSIS — R413 Other amnesia: Secondary | ICD-10-CM

## 2017-06-07 DIAGNOSIS — S3992XA Unspecified injury of lower back, initial encounter: Secondary | ICD-10-CM | POA: Diagnosis not present

## 2017-06-07 DIAGNOSIS — M545 Low back pain: Secondary | ICD-10-CM | POA: Diagnosis not present

## 2017-06-07 DIAGNOSIS — W01198A Fall on same level from slipping, tripping and stumbling with subsequent striking against other object, initial encounter: Secondary | ICD-10-CM | POA: Diagnosis not present

## 2017-06-07 DIAGNOSIS — S300XXA Contusion of lower back and pelvis, initial encounter: Secondary | ICD-10-CM | POA: Diagnosis not present

## 2017-06-07 DIAGNOSIS — I10 Essential (primary) hypertension: Secondary | ICD-10-CM | POA: Diagnosis not present

## 2017-06-07 DIAGNOSIS — S34139A Unspecified injury to sacral spinal cord, initial encounter: Secondary | ICD-10-CM | POA: Diagnosis not present

## 2017-06-07 DIAGNOSIS — Z79899 Other long term (current) drug therapy: Secondary | ICD-10-CM | POA: Diagnosis not present

## 2017-07-05 ENCOUNTER — Other Ambulatory Visit: Payer: Self-pay | Admitting: *Deleted

## 2017-07-05 DIAGNOSIS — M545 Low back pain, unspecified: Secondary | ICD-10-CM

## 2017-07-05 DIAGNOSIS — M79604 Pain in right leg: Secondary | ICD-10-CM

## 2017-07-05 MED ORDER — HYDROCODONE-ACETAMINOPHEN 10-325 MG PO TABS: 1.0000 | ORAL_TABLET | Freq: Four times a day (QID) | ORAL | 0 refills | Status: DC | PRN

## 2017-07-05 NOTE — Telephone Encounter (Signed)
Patient notified

## 2017-07-05 NOTE — Telephone Encounter (Signed)
Ok to refill? Kaylee Warner reports last RF 05/15/17 #120; However she also received Oxycodone 5-325 on 06/07/17 #12 from Dr. Richardson Chiquito in [place]. (Looks like EDP at [facility])

## 2017-07-26 ENCOUNTER — Encounter: Payer: Self-pay | Admitting: Internal Medicine

## 2017-07-26 ENCOUNTER — Ambulatory Visit (INDEPENDENT_AMBULATORY_CARE_PROVIDER_SITE_OTHER): Payer: Medicare Other | Admitting: Internal Medicine

## 2017-07-26 VITALS — BP 108/60 | HR 65 | Temp 97.9°F | Resp 18 | Ht 68.0 in | Wt 172.0 lb

## 2017-07-26 DIAGNOSIS — W19XXXD Unspecified fall, subsequent encounter: Secondary | ICD-10-CM

## 2017-07-26 DIAGNOSIS — S300XXA Contusion of lower back and pelvis, initial encounter: Secondary | ICD-10-CM

## 2017-07-26 DIAGNOSIS — R413 Other amnesia: Secondary | ICD-10-CM

## 2017-07-26 DIAGNOSIS — G894 Chronic pain syndrome: Secondary | ICD-10-CM | POA: Diagnosis not present

## 2017-07-26 DIAGNOSIS — M545 Low back pain, unspecified: Secondary | ICD-10-CM

## 2017-07-26 DIAGNOSIS — I10 Essential (primary) hypertension: Secondary | ICD-10-CM

## 2017-07-26 DIAGNOSIS — M79604 Pain in right leg: Secondary | ICD-10-CM

## 2017-07-26 DIAGNOSIS — Z79899 Other long term (current) drug therapy: Secondary | ICD-10-CM | POA: Diagnosis not present

## 2017-07-26 NOTE — Patient Instructions (Addendum)
Continue current medications as ordered  Will call with lab results  Follow up in 3 mos for memory loss, HTN, chronic pain

## 2017-07-26 NOTE — Progress Notes (Signed)
Patient ID: Kaylee Warner, female   DOB: June 16, 1945, 72 y.o.   MRN: [id number]   Location:  [facility] OFFICE  Provider: DR Arletha Grippe  Code Status: Goals of Care:  Advanced Directives 05/15/2017  Does Patient Have a Medical Advance Directive? No  Type of Advance Directive -  Copy of [place] in Chart? -  Would patient like information on creating a medical advance directive? Yes (MAU/Ambulatory/Procedural Areas - Information given)     Chief Complaint  Patient presents with  . Medical Management of Chronic Issues    3 mo f/u -HTN, chronic pain syndrome, mood disoder,     HPI: Patient is a 72 y.o. female seen today for medical management of chronic diseases.  She was seen in the ED at [facility] after fall on 05/26/17 while walking down the stairs at home rushing to go to church. No head trauma. Imaging of lumbar and pelvis neg. She was tx with prn percocet. She is now taking "more" hydrocodone due to pain. She noticed that her BP is lower.  Chronic pain syndrome - worse due to recent fall. She is still working as much as she can. Pain severe in lower back. She had an epidural injection on July 05, 2016 with Dr Maryruth Eve. No radiation of pain at this time.  Seasonal allergy - takes allegra daily prn  Left buccal lichen planus - she saw ENT Dr Redmond Baseman in Aug 2018 and he dx left cheek lesion as lichen planus. No bx recommended as she did not have sx's. Her dentist is also following the area.  Mood - overall stable on paxil. She does benefit from this regimen. In the past, switched from cymbalta to sertraline due to cost. She finds that it does not help her self-esteem. celexa ineffective.   HTN - BP stable on amlodipine and lisinopril. She stopped taking metoprolol hct on her own several mos ago.  Memory loss - MMSE 30/30 (previous 28/30). She was taking vayacog rx by previous PCP but too expensive. Memory has improved on aricept. Not having as much difficulty  remembering names, schedule and conversations. Weight up 2lbs since Apr 2019         Past Medical History:  Diagnosis Date  . Allergy   . Anemia   . Arthritis   . Hypertension     Past Surgical History:  Procedure Laterality Date  . COLONOSCOPY    . JOINT REPLACEMENT  2008  . SARK 2009     Dr. Jolee Ewing      reports that she has never smoked. She has never used smokeless tobacco. She reports that she drinks about 4.2 oz of alcohol per week. She reports that she does not use drugs. Social History   Socioeconomic History  . Marital status: Married    Spouse name: Not on file  . Number of children: Not on file  . Years of education: Not on file  . Highest education level: Not on file  Occupational History  . Occupation: Training and development officer   Social Needs  . Financial resource strain: Not hard at all  . Food insecurity:    Worry: Never true    Inability: Never true  . Transportation needs:    Medical: No    Non-medical: No  Tobacco Use  . Smoking status: Never Smoker  . Smokeless tobacco: Never Used  Substance and Sexual Activity  . Alcohol use: Yes    Alcohol/week: 4.2 oz    Types: 7 Glasses  of wine per week    Comment: 1 Glass a day   . Drug use: No  . Sexual activity: Never  Lifestyle  . Physical activity:    Days per week: 1 day    Minutes per session: 30 min  . Stress: To some extent  Relationships  . Social connections:    Talks on phone: More than three times a week    Gets together: More than three times a week    Attends religious service: 1 to 4 times per year    Active member of club or organization: Yes    Attends meetings of clubs or organizations: 1 to 4 times per year    Relationship status: Married  . Intimate partner violence:    Fear of current or ex partner: No    Emotionally abused: No    Physically abused: No    Forced sexual activity: No  Other Topics Concern  . Not on file  Social History Narrative   Diet:Regular   Do you drink/eat things  with caffeine? Yes   Marital status:  Married                             What year were you married?    Do you live in a house, apartment, assisted living, condo, trailer, etc)? House   Is it one or more stories? 2   How many persons live in your home? 2  Do you have any pets in your home? No   Current or past profession: Regulatory affairs officer   Do you exercise?   Yes                                                  Type & how often: stretches   Do you have a living will? No    Do you have a DNR Form?  No   Do you have a POA/HPOA forms? No    Family History  Problem Relation Age of Onset  . Diabetes Sister   . Sjogren's syndrome Daughter   . Diabetes Son   . Hypertension Son   . Diabetes Son   . Diabetes Unknown        family history   . Arthritis Unknown        family history   . Colon polyps Neg Hx   . Colon cancer Neg Hx   . Esophageal cancer Neg Hx   . Stomach cancer Neg Hx   . Rectal cancer Neg Hx     Allergies  Allergen Reactions  . Celecoxib     REACTION: blood pressure went up  . Codeine Itching  . Fexofenadine Other (See Comments)    unknown  . Penicillins Rash    Outpatient Encounter Medications as of 07/26/2017  Medication Sig  . amLODipine (NORVASC) 10 MG tablet TAKE ONE-HALF TABLET BY MOUTH ONCE DAILY  . donepezil (ARICEPT) 10 MG tablet TAKE 1 TABLET BY MOUTH AT BEDTIME  . HYDROcodone-acetaminophen (NORCO) 10-325 MG tablet Take 1 tablet by mouth every 6 (six) hours as needed. As needed moderate - severe pain  . lisinopril (PRINIVIL,ZESTRIL) 40 MG tablet TAKE 1 TABLET BY MOUTH ONCE DAILY  . meloxicam (MOBIC) 7.5 MG tablet Take 1 tablet (7.5 mg total) by mouth daily.  Marland Kitchen PARoxetine (  PAXIL) 20 MG tablet TAKE 1 TABLET BY MOUTH ONCE DAILY   No facility-administered encounter medications on file as of 07/26/2017.     Review of Systems:  Review of Systems  Health Maintenance  Topic Date Due  . PNA vac Low Risk Adult (2 of 2 - PPSV23) 12/15/2016  . Hepatitis C  Screening  01/17/2018 (Originally 06-28-1945)  . INFLUENZA VACCINE  08/10/2017  . MAMMOGRAM  09/06/2018  . COLONOSCOPY  10/17/2021  . TETANUS/TDAP  01/18/2027  . DEXA SCAN  Completed    Physical Exam: Vitals:   07/26/17 1406  BP: 108/60  Pulse: 65  Resp: 18  Temp: 97.9 F (36.6 C)  TempSrc: Oral  SpO2: 98%  Weight: 172 lb (78 kg)  Height: _0  (1.727 m)   Body mass index is 26.15 kg/m. Physical Exam  Constitutional: She is oriented to person, place, and time. She appears well-developed and well-nourished.  HENT:  Mouth/Throat: Oropharynx is clear and moist. No oropharyngeal exudate.  MMM; no oral thrush  Eyes: Pupils are equal, round, and reactive to light. No scleral icterus.  Neck: Neck supple. Carotid bruit is not present. No tracheal deviation present. No thyromegaly present.  Cardiovascular: Normal rate, regular rhythm, normal heart sounds and intact distal pulses. Exam reveals no gallop and no friction rub.  No murmur heard. No LE edema b/l. no calf TTP.   Pulmonary/Chest: Effort normal and breath sounds normal. No stridor. No respiratory distress. She has no wheezes. She has no rales.  Abdominal: Soft. Normal appearance and bowel sounds are normal. She exhibits no distension and no mass. There is no hepatomegaly. There is no tenderness. There is no rigidity, no rebound and no guarding. No hernia.  Musculoskeletal: She exhibits edema and tenderness.       Lumbar back: She exhibits decreased range of motion, tenderness and spasm.       Back:  Lymphadenopathy:    She has no cervical adenopathy.  Neurological: She is alert and oriented to person, place, and time. She has normal reflexes.  Skin: Skin is warm and dry. No rash noted.  Psychiatric: She has a normal mood and affect. Her behavior is normal. Judgment and thought content normal.    Labs reviewed: Basic Metabolic Panel: Recent Labs    01/17/17 1520  NA 138  K 4.1  CL 105  CO2 26  GLUCOSE 85  BUN 11    CREATININE 0.69  CALCIUM 9.4  TSH 0.87   Liver Function Tests: Recent Labs    01/17/17 1520  AST 14  ALT 11  BILITOT 0.5  PROT 6.5   No results for input(s): LIPASE, AMYLASE in the last 8760 hours. No results for input(s): AMMONIA in the last 8760 hours. CBC: No results for input(s): WBC, NEUTROABS, HGB, HCT, MCV, PLT in the last 8760 hours. Lipid Panel: No results for input(s): CHOL, HDL, LDLCALC, TRIG, CHOLHDL, LDLDIRECT in the last 8760 hours. No results found for: HGBA1C  Procedures since last visit: No results found.  Assessment/Plan   ICD-10-CM   1. Fall, subsequent encounter W19.XXXD   2. Contusion of lower back, initial encounter S30.0XXA   3. Chronic pain syndrome G89.4   4. Memory loss or impairment R41.3   5. Essential hypertension I10 CBC (no diff)    CMP with eGFR([facility])  6. Lumbar pain with radiation down right leg M54.5   7. High risk medication use Z79.899 CBC (no diff)    CMP with eGFR([facility])     Needs  fasting lipid panel next ov  Continue current medications as ordered  Will call with lab results  Follow up in 3 mos for memory loss, HTN, chronic pain   Kaylee Warner  [facility] [address] [place], [place] [address] [phone] Cell (Monday-Friday 8 AM - 5 PM) [phone] After 5 PM and follow prompts

## 2017-07-27 LAB — COMPLETE METABOLIC PANEL WITH GFR
AG Ratio: 1.7 (calc) (ref 1.0–2.5)
ALT: 6 U/L (ref 6–29)
AST: 11 U/L (ref 10–35)
Albumin: 4.2 g/dL (ref 3.6–5.1)
Alkaline phosphatase (APISO): 59 U/L (ref 33–130)
BUN/Creatinine Ratio: 17 (calc) (ref 6–22)
BUN: 25 mg/dL (ref 7–25)
CO2: 25 mmol/L (ref 20–32)
Calcium: 9.9 mg/dL (ref 8.6–10.4)
Chloride: 105 mmol/L (ref 98–110)
Creat: 1.47 mg/dL — ABNORMAL HIGH (ref 0.60–0.93)
GFR, Est African American: 41 mL/min/{1.73_m2} — ABNORMAL LOW (ref >=60)
GFR, Est Non African American: 35 mL/min/{1.73_m2} — ABNORMAL LOW (ref >=60)
Globulin: 2.5 g/dL (calc) (ref 1.9–3.7)
Glucose, Bld: 101 mg/dL (ref 65–139)
Potassium: 3.8 mmol/L (ref 3.5–5.3)
Sodium: 141 mmol/L (ref 135–146)
Total Bilirubin: 0.3 mg/dL (ref 0.2–1.2)
Total Protein: 6.7 g/dL (ref 6.1–8.1)

## 2017-07-27 LAB — CBC
HCT: 34.4 % — ABNORMAL LOW (ref 35.0–45.0)
Hemoglobin: 10.8 g/dL — ABNORMAL LOW (ref 11.7–15.5)
MCH: 24.7 pg — ABNORMAL LOW (ref 27.0–33.0)
MCHC: 31.4 g/dL — ABNORMAL LOW (ref 32.0–36.0)
MCV: 78.5 fL — ABNORMAL LOW (ref 80.0–100.0)
MPV: 10.8 fL (ref 7.5–12.5)
Platelets: 358 10*3/uL (ref 140–400)
RBC: 4.38 10*6/uL (ref 3.80–5.10)
RDW: 15.8 % — ABNORMAL HIGH (ref 11.0–15.0)
WBC: 7.6 10*3/uL (ref 3.8–10.8)

## 2017-07-28 ENCOUNTER — Other Ambulatory Visit: Payer: Self-pay

## 2017-07-28 DIAGNOSIS — N289 Disorder of kidney and ureter, unspecified: Secondary | ICD-10-CM

## 2017-08-03 ENCOUNTER — Other Ambulatory Visit: Payer: Medicare Other

## 2017-08-03 DIAGNOSIS — N289 Disorder of kidney and ureter, unspecified: Secondary | ICD-10-CM | POA: Diagnosis not present

## 2017-08-03 LAB — BASIC METABOLIC PANEL WITH GFR
BUN/Creatinine Ratio: 24 (calc) — ABNORMAL HIGH (ref 6–22)
BUN: 34 mg/dL — ABNORMAL HIGH (ref 7–25)
CO2: 28 mmol/L (ref 20–32)
Calcium: 9.9 mg/dL (ref 8.6–10.4)
Chloride: 105 mmol/L (ref 98–110)
Creat: 1.4 mg/dL — ABNORMAL HIGH (ref 0.60–0.93)
GFR, Est African American: 43 mL/min/{1.73_m2} — ABNORMAL LOW (ref >=60)
GFR, Est Non African American: 37 mL/min/{1.73_m2} — ABNORMAL LOW (ref >=60)
Glucose, Bld: 98 mg/dL (ref 65–139)
Potassium: 4.3 mmol/L (ref 3.5–5.3)
Sodium: 141 mmol/L (ref 135–146)

## 2017-08-04 ENCOUNTER — Other Ambulatory Visit: Payer: Self-pay

## 2017-08-04 DIAGNOSIS — M545 Low back pain: Secondary | ICD-10-CM

## 2017-08-04 DIAGNOSIS — M79604 Pain in right leg: Secondary | ICD-10-CM

## 2017-08-04 NOTE — Telephone Encounter (Signed)
Patient requested a refill on hydrocodone/apap 10-325 mg. Rx was called in to pharmacy after verifying last fill date, provider, and quantity on PMP [place].   Last refill was 07/05/17

## 2017-08-07 MED ORDER — HYDROCODONE-ACETAMINOPHEN 10-325 MG PO TABS: 1.0000 | ORAL_TABLET | Freq: Four times a day (QID) | ORAL | 0 refills | Status: DC | PRN

## 2017-08-24 ENCOUNTER — Other Ambulatory Visit: Payer: Self-pay | Admitting: Internal Medicine

## 2017-08-24 DIAGNOSIS — M179 Osteoarthritis of knee, unspecified: Secondary | ICD-10-CM

## 2017-08-24 DIAGNOSIS — M1710 Unilateral primary osteoarthritis, unspecified knee: Secondary | ICD-10-CM

## 2017-08-24 DIAGNOSIS — F4329 Adjustment disorder with other symptoms: Secondary | ICD-10-CM

## 2017-08-30 ENCOUNTER — Encounter: Payer: Self-pay | Admitting: Internal Medicine

## 2017-09-08 ENCOUNTER — Other Ambulatory Visit: Payer: Self-pay

## 2017-09-08 DIAGNOSIS — M545 Low back pain: Secondary | ICD-10-CM

## 2017-09-08 DIAGNOSIS — M79604 Pain in right leg: Secondary | ICD-10-CM

## 2017-09-08 MED ORDER — HYDROCODONE-ACETAMINOPHEN 10-325 MG PO TABS: 1.0000 | ORAL_TABLET | Freq: Four times a day (QID) | ORAL | 0 refills | Status: DC | PRN

## 2017-09-08 NOTE — Telephone Encounter (Signed)
Refill request- Hydrocodone, last filled 08/07/17   [place] Database verified and compliance confirmed   Patient asked to see Dr.Carter before she leaves [facility]. Patient informed no availability prior to Dr.Carter leaving. Patient asked to be placed on schedule if cancellation. Patient refused to schedule with NP

## 2017-09-12 DIAGNOSIS — M5416 Radiculopathy, lumbar region: Secondary | ICD-10-CM | POA: Diagnosis not present

## 2017-09-12 DIAGNOSIS — M48062 Spinal stenosis, lumbar region with neurogenic claudication: Secondary | ICD-10-CM | POA: Diagnosis not present

## 2017-09-18 ENCOUNTER — Other Ambulatory Visit: Payer: Self-pay | Admitting: Internal Medicine

## 2017-09-18 DIAGNOSIS — I10 Essential (primary) hypertension: Secondary | ICD-10-CM

## 2017-09-19 ENCOUNTER — Telehealth: Payer: Self-pay | Admitting: *Deleted

## 2017-09-19 NOTE — Telephone Encounter (Signed)
Patient called and left message on clinical intake stating that she needed her blood pressure medication called in.   Tried calling patient back and LMOM requesting to [facility] regarding which BP medication and pharmacy. Awaiting callback.

## 2017-09-20 ENCOUNTER — Other Ambulatory Visit: Payer: Self-pay | Admitting: Internal Medicine

## 2017-09-20 DIAGNOSIS — G894 Chronic pain syndrome: Secondary | ICD-10-CM

## 2017-09-20 NOTE — Telephone Encounter (Signed)
LMOM to return call.

## 2017-09-22 ENCOUNTER — Other Ambulatory Visit: Payer: Self-pay | Admitting: Internal Medicine

## 2017-09-22 DIAGNOSIS — I10 Essential (primary) hypertension: Secondary | ICD-10-CM

## 2017-10-05 ENCOUNTER — Other Ambulatory Visit: Payer: Self-pay | Admitting: Internal Medicine

## 2017-10-05 DIAGNOSIS — I10 Essential (primary) hypertension: Secondary | ICD-10-CM

## 2017-10-17 ENCOUNTER — Other Ambulatory Visit: Payer: Self-pay | Admitting: *Deleted

## 2017-10-17 DIAGNOSIS — M545 Low back pain, unspecified: Secondary | ICD-10-CM

## 2017-10-17 DIAGNOSIS — M79604 Pain in right leg: Secondary | ICD-10-CM

## 2017-10-17 MED ORDER — HYDROCODONE-ACETAMINOPHEN 10-325 MG PO TABS: 1.0000 | ORAL_TABLET | Freq: Four times a day (QID) | ORAL | 0 refills | Status: DC | PRN

## 2017-10-17 NOTE — Telephone Encounter (Signed)
Patient requested refill [place] Database Verified [place]: 09/08/2017 Pended Rx and sent to Dr. Eulas Post for approval.

## 2017-10-23 DIAGNOSIS — M7551 Bursitis of right shoulder: Secondary | ICD-10-CM | POA: Diagnosis not present

## 2017-11-06 ENCOUNTER — Ambulatory Visit (INDEPENDENT_AMBULATORY_CARE_PROVIDER_SITE_OTHER): Payer: Medicare Other

## 2017-11-06 ENCOUNTER — Encounter: Payer: Self-pay | Admitting: Orthopedic Surgery

## 2017-11-06 ENCOUNTER — Ambulatory Visit: Payer: Medicare Other | Admitting: Orthopedic Surgery

## 2017-11-06 VITALS — BP 133/84 | HR 80 | Ht 69.0 in | Wt 172.0 lb

## 2017-11-06 DIAGNOSIS — M75101 Unspecified rotator cuff tear or rupture of right shoulder, not specified as traumatic: Secondary | ICD-10-CM | POA: Diagnosis not present

## 2017-11-06 DIAGNOSIS — M19011 Primary osteoarthritis, right shoulder: Secondary | ICD-10-CM

## 2017-11-06 DIAGNOSIS — M25511 Pain in right shoulder: Secondary | ICD-10-CM

## 2017-11-06 DIAGNOSIS — G8929 Other chronic pain: Secondary | ICD-10-CM

## 2017-11-06 NOTE — Progress Notes (Signed)
NEW PATIENT OFFICE VISIT  Chief Complaint  Patient presents with  . Shoulder Pain    Right shoulder no injury 1 mo pain    73 female presents for evaluation of painful right shoulder x1 month.  She did take some prednisone orally from her primary care doctor which helped.  She does not report any trauma  She reports 8 out of 10 pain decreased range of motion with internal rotation and flexion describes the pain as a dull toothache increased pain at night.     Review of Systems  Musculoskeletal: Positive for back pain and joint pain.  Neurological: Positive for focal weakness. Negative for tingling, sensory change and weakness.     Past Medical History:  Diagnosis Date  . Allergy   . Anemia   . Arthritis   . Hypertension     Past Surgical History:  Procedure Laterality Date  . COLONOSCOPY    . JOINT REPLACEMENT  2008  . SARK 2009     Dr. Jolee Ewing     Family History  Problem Relation Age of Onset  . Diabetes Sister   . Sjogren's syndrome Daughter   . Diabetes Son   . Hypertension Son   . Diabetes Son   . Diabetes Unknown        family history   . Arthritis Unknown        family history   . Colon polyps Neg Hx   . Colon cancer Neg Hx   . Esophageal cancer Neg Hx   . Stomach cancer Neg Hx   . Rectal cancer Neg Hx    Social History   Tobacco Use  . Smoking status: Never Smoker  . Smokeless tobacco: Never Used  Substance Use Topics  . Alcohol use: Yes    Alcohol/week: 7.0 standard drinks    Types: 7 Glasses of wine per week    Comment: 1 Glass a day   . Drug use: No    Allergies  Allergen Reactions  . Celecoxib     REACTION: blood pressure went up  . Codeine Itching  . Fexofenadine Other (See Comments)    unknown  . Penicillins Rash    Current Meds  Medication Sig  . amLODipine (NORVASC) 10 MG tablet TAKE 1/2 (ONE-HALF) TABLET BY MOUTH ONCE DAILY  . HYDROcodone-acetaminophen (NORCO) 10-325 MG tablet Take 1 tablet by mouth every 6 (six) hours  as needed. As needed moderate - severe pain  . lisinopril (PRINIVIL,ZESTRIL) 40 MG tablet TAKE 1 TABLET BY MOUTH ONCE DAILY  . meloxicam (MOBIC) 7.5 MG tablet Take 1 tablet (7.5 mg total) by mouth daily.  Marland Kitchen PARoxetine (PAXIL) 20 MG tablet TAKE 1 TABLET BY MOUTH ONCE DAILY    BP 133/84   Pulse 80   Ht 5\' 9"  (1.753 m)   Wt 172 lb (78 kg)   BMI 25.40 kg/m   Physical Exam  Constitutional: She is oriented to person, place, and time. She appears well-developed and well-nourished.  Neurological: She is alert and oriented to person, place, and time.  Psychiatric: She has a normal mood and affect. Judgment normal.  Vitals reviewed.   Right Shoulder Exam  Right shoulder exam is normal.  Tenderness  The patient is experiencing tenderness in the acromion.  Muscle Strength  The patient has normal right shoulder strength. Abduction: 4/5  Internal rotation: 5/5  External rotation: 4/5  Supraspinatus: 4/5  Subscapularis: 5/5  Biceps: 5/5   Tests  Apprehension: negative Hawkins test: positive Cross arm:  negative Impingement: positive Drop arm: negative Sulcus: absent  Other  Erythema: absent Sensation: normal Pulse: present  Comments:  Shoulder range of motion Active: Flexion 100 degrees Abduction 90 degrees External rotation 45 degrees Internal rotation right hip pocket   Left Shoulder Exam  Left shoulder exam is normal.  Tenderness  The patient is experiencing no tenderness.   Range of Motion  The patient has normal left shoulder ROM.  Muscle Strength  The patient has normal left shoulder strength.  Tests  Apprehension: negative  Other  Erythema: absent Sensation: normal Pulse: present         MEDICAL DECISION SECTION  Xrays were done at [facility] orthopedics 2 views right shoulder see dictated report  My independent reading of xrays:  Mild glenohumeral arthritis Decreased humeral acromial distance Cyst formation at the greater tuberosity and  sclerosis of the undersurface of the acromion  Encounter Diagnoses  Name Primary?  Marland Kitchen Nontraumatic tear of right rotator cuff, unspecified tear extent Yes  . Chronic right shoulder pain   . Primary osteoarthritis of right shoulder     PLAN: (Rx., injectx, surgery, frx, mri/ct) Subacromial injection and MRI right shoulder follow-up after MRI differential diagnosis bursitis tendinitis impingement syndrome partial tear complete tear rotator cuff  No orders of the defined types were placed in this encounter.   Procedure note the subacromial injection shoulder RIGHT  Verbal consent was obtained to inject the  RIGHT   Shoulder  Timeout was completed to confirm the injection site is a subacromial space of the  RIGHT  shoulder   Medication used Depo-Medrol 40 mg and lidocaine 1% 3 cc  Anesthesia was provided by ethyl chloride  The injection was performed in the RIGHT  posterior subacromial space. After pinning the skin with alcohol and anesthetized the skin with ethyl chloride the subacromial space was injected using a 20-gauge needle. There were no complications  Sterile dressing was applied.   Arther Abbott, MD  11/06/2017 10:46 AM

## 2017-11-10 ENCOUNTER — Other Ambulatory Visit: Payer: Self-pay | Admitting: Internal Medicine

## 2017-11-10 ENCOUNTER — Ambulatory Visit (HOSPITAL_COMMUNITY)
Admission: RE | Admit: 2017-11-10 | Discharge: 2017-11-10 | Disposition: A | Discharge status: Home / Self Care | Payer: Medicare Other | Source: Ambulatory Visit | Attending: Orthopedic Surgery | Admitting: Orthopedic Surgery

## 2017-11-10 DIAGNOSIS — M19011 Primary osteoarthritis, right shoulder: Secondary | ICD-10-CM | POA: Insufficient documentation

## 2017-11-10 DIAGNOSIS — M7551 Bursitis of right shoulder: Secondary | ICD-10-CM | POA: Insufficient documentation

## 2017-11-10 DIAGNOSIS — M75121 Complete rotator cuff tear or rupture of right shoulder, not specified as traumatic: Secondary | ICD-10-CM | POA: Diagnosis not present

## 2017-11-10 DIAGNOSIS — M75111 Incomplete rotator cuff tear or rupture of right shoulder, not specified as traumatic: Secondary | ICD-10-CM | POA: Diagnosis not present

## 2017-11-10 DIAGNOSIS — Z1231 Encounter for screening mammogram for malignant neoplasm of breast: Secondary | ICD-10-CM

## 2017-11-10 DIAGNOSIS — M25511 Pain in right shoulder: Secondary | ICD-10-CM | POA: Diagnosis not present

## 2017-11-10 DIAGNOSIS — M6250 Muscle wasting and atrophy, not elsewhere classified, unspecified site: Secondary | ICD-10-CM | POA: Insufficient documentation

## 2017-11-10 DIAGNOSIS — G8929 Other chronic pain: Secondary | ICD-10-CM

## 2017-11-29 ENCOUNTER — Encounter: Payer: Self-pay | Admitting: Orthopedic Surgery

## 2017-11-29 ENCOUNTER — Ambulatory Visit: Payer: Medicare Other | Admitting: Orthopedic Surgery

## 2017-11-29 VITALS — BP 147/89 | HR 68 | Ht 69.0 in | Wt 170.0 lb

## 2017-11-29 DIAGNOSIS — G8929 Other chronic pain: Secondary | ICD-10-CM

## 2017-11-29 DIAGNOSIS — M75101 Unspecified rotator cuff tear or rupture of right shoulder, not specified as traumatic: Secondary | ICD-10-CM | POA: Diagnosis not present

## 2017-11-29 DIAGNOSIS — F32A Depression, unspecified: Secondary | ICD-10-CM | POA: Insufficient documentation

## 2017-11-29 DIAGNOSIS — M79604 Pain in right leg: Secondary | ICD-10-CM

## 2017-11-29 DIAGNOSIS — M545 Low back pain: Secondary | ICD-10-CM | POA: Diagnosis not present

## 2017-11-29 DIAGNOSIS — M25511 Pain in right shoulder: Secondary | ICD-10-CM | POA: Diagnosis not present

## 2017-11-29 DIAGNOSIS — M19011 Primary osteoarthritis, right shoulder: Secondary | ICD-10-CM | POA: Diagnosis not present

## 2017-11-29 DIAGNOSIS — F329 Major depressive disorder, single episode, unspecified: Secondary | ICD-10-CM | POA: Insufficient documentation

## 2017-11-29 MED ORDER — HYDROCODONE-ACETAMINOPHEN 10-325 MG PO TABS: 1.0000 | ORAL_TABLET | Freq: Four times a day (QID) | ORAL | 0 refills | Status: DC | PRN

## 2017-11-29 NOTE — Patient Instructions (Addendum)
You have decided to proceed with rotator cuff repair surgery. You have decided not to continue with nonoperative measures such as but not limited to oral medication,   activity modification, physical therapy, or injection.  We will perform a rotator cuff repair. Some of the risks associated with rotator cuff repair include but are not limited to Bleeding Infection Swelling Stiffness Blood clot Pain Re-tearing of the rotator cuff Failure of the rotator cuff to heal  In compliance with recent [place] law in federal regulation regarding opioid use and abuse and addiction, we will taper (stop) opioid medication after 6 weeks.  If you're not comfortable with these risks and would like to continue with nonoperative treatment please let Dr. Aline Brochure know prior to your surgery.    Rotator Cuff Injury Rotator cuff injury is any type of injury to the set of muscles and tendons that make up the stabilizing unit of your shoulder. This unit holds the ball of your upper arm bone (humerus) in the socket of your shoulder blade (scapula). What are the causes? Injuries to your rotator cuff most commonly come from sports or activities that cause your arm to be moved repeatedly over your head. Examples of this include throwing, weight lifting, swimming, or racquet sports. Long lasting (chronic) irritation of your rotator cuff can cause soreness and swelling (inflammation), bursitis, and eventual damage to your tendons, such as a tear (rupture). What are the signs or symptoms? Acute rotator cuff tear:  Sudden tearing sensation followed by severe pain shooting from your upper shoulder down your arm toward your elbow.  Decreased range of motion of your shoulder because of pain and muscle spasm.  Severe pain.  Inability to raise your arm out to the side because of pain and loss of muscle power (large tears).  Chronic rotator cuff tear:  Pain that usually is worse at night and may interfere with  sleep.  Gradual weakness and decreased shoulder motion as the pain worsens.  Decreased range of motion.  Rotator cuff tendinitis:  Deep ache in your shoulder and the outside upper arm over your shoulder.  Pain that comes on gradually and becomes worse when lifting your arm to the side or turning it inward.  How is this diagnosed? Rotator cuff injury is diagnosed through a medical history, physical exam, and imaging exam. The medical history helps determine the type of rotator cuff injury. Your health care provider will look at your injured shoulder, feel the injured area, and ask you to move your shoulder in different positions. X-ray exams typically are done to rule out other causes of shoulder pain, such as fractures. MRI is the exam of choice for the most severe shoulder injuries because the images show muscles and tendons. How is this treated? Chronic tear:  Medicine for pain, such as acetaminophen or ibuprofen.  Physical therapy and range-of-motion exercises may be helpful in maintaining shoulder function and strength.  Steroid injections into your shoulder joint.  Surgical repair of the rotator cuff if the injury does not heal with noninvasive treatment.  Acute tear:  Anti-inflammatory medicines such as ibuprofen and naproxen to help reduce pain and swelling.  A sling to help support your arm and rest your rotator cuff muscles. Long-term use of a sling is not advised. It may cause significant stiffening of the shoulder joint.  Surgery may be considered within a few weeks, especially in younger, active people, to return the shoulder to full function.  Indications for surgical treatment include the following: ?  Age younger than 43 years. ? Rotator cuff tears that are complete. ? Physical therapy, rest, and anti-inflammatory medicines have been used for 6-8 weeks, with no improvement. ? Employment or sporting activity that requires constant shoulder  use.  Tendinitis:  Anti-inflammatory medicines such as ibuprofen and naproxen to help reduce pain and swelling.  A sling to help support your arm and rest your rotator cuff muscles. Long-term use of a sling is not advised. It may cause significant stiffening of the shoulder joint.  Severe tendinitis may require: ? Steroid injections into your shoulder joint. ? Physical therapy. ? Surgery.  Follow these instructions at home:  Apply ice to your injury: ? Put ice in a plastic bag. ? Place a towel between your skin and the bag. ? Leave the ice on for 20 minutes, 2-3 times a day.  If you have a shoulder immobilizer (sling and straps), wear it until told otherwise by your health care provider.  You may want to sleep on several pillows or in a recliner at night to lessen swelling and pain.  Only take over-the-counter or prescription medicines for pain, discomfort, or fever as directed by your health care provider.  Do simple hand squeezing exercises with a soft rubber ball to decrease hand swelling. Contact a health care provider if:  Your shoulder pain increases, or new pain or numbness develops in your arm, hand, or fingers.  Your hand or fingers are colder than your other hand. Get help right away if:  Your arm, hand, or fingers are numb or tingling.  Your arm, hand, or fingers are increasingly swollen and painful, or they turn white or blue. This information is not intended to replace advice given to you by your health care provider. Make sure you discuss any questions you have with your health care provider. Document Released: 12/25/1999 Document Revised: 06/04/2015 Document Reviewed: 08/08/2012 Elsevier Interactive Patient Education  2018 [place] Cuff Injury Rotator cuff injury is any type of injury to the set of muscles and tendons that make up the stabilizing unit of your shoulder. This unit holds the ball of your upper arm bone (humerus) in the socket of  your shoulder blade (scapula). What are the causes? Injuries to your rotator cuff most commonly come from sports or activities that cause your arm to be moved repeatedly over your head. Examples of this include throwing, weight lifting, swimming, or racquet sports. Long lasting (chronic) irritation of your rotator cuff can cause soreness and swelling (inflammation), bursitis, and eventual damage to your tendons, such as a tear (rupture). What are the signs or symptoms? Acute rotator cuff tear:  Sudden tearing sensation followed by severe pain shooting from your upper shoulder down your arm toward your elbow.  Decreased range of motion of your shoulder because of pain and muscle spasm.  Severe pain.  Inability to raise your arm out to the side because of pain and loss of muscle power (large tears).  Chronic rotator cuff tear:  Pain that usually is worse at night and may interfere with sleep.  Gradual weakness and decreased shoulder motion as the pain worsens.  Decreased range of motion.  Rotator cuff tendinitis:  Deep ache in your shoulder and the outside upper arm over your shoulder.  Pain that comes on gradually and becomes worse when lifting your arm to the side or turning it inward.  How is this diagnosed? Rotator cuff injury is diagnosed through a medical history, physical exam, and imaging exam. The  medical history helps determine the type of rotator cuff injury. Your health care provider will look at your injured shoulder, feel the injured area, and ask you to move your shoulder in different positions. X-ray exams typically are done to rule out other causes of shoulder pain, such as fractures. MRI is the exam of choice for the most severe shoulder injuries because the images show muscles and tendons. How is this treated? Chronic tear:  Medicine for pain, such as acetaminophen or ibuprofen.  Physical therapy and range-of-motion exercises may be helpful in maintaining shoulder  function and strength.  Steroid injections into your shoulder joint.  Surgical repair of the rotator cuff if the injury does not heal with noninvasive treatment.  Acute tear:  Anti-inflammatory medicines such as ibuprofen and naproxen to help reduce pain and swelling.  A sling to help support your arm and rest your rotator cuff muscles. Long-term use of a sling is not advised. It may cause significant stiffening of the shoulder joint.  Surgery may be considered within a few weeks, especially in younger, active people, to return the shoulder to full function.  Indications for surgical treatment include the following: ? Age younger than 67 years. ? Rotator cuff tears that are complete. ? Physical therapy, rest, and anti-inflammatory medicines have been used for 6-8 weeks, with no improvement. ? Employment or sporting activity that requires constant shoulder use.  Tendinitis:  Anti-inflammatory medicines such as ibuprofen and naproxen to help reduce pain and swelling.  A sling to help support your arm and rest your rotator cuff muscles. Long-term use of a sling is not advised. It may cause significant stiffening of the shoulder joint.  Severe tendinitis may require: ? Steroid injections into your shoulder joint. ? Physical therapy. ? Surgery.  Follow these instructions at home:  Apply ice to your injury: ? Put ice in a plastic bag. ? Place a towel between your skin and the bag. ? Leave the ice on for 20 minutes, 2-3 times a day.  If you have a shoulder immobilizer (sling and straps), wear it until told otherwise by your health care provider.  You may want to sleep on several pillows or in a recliner at night to lessen swelling and pain.  Only take over-the-counter or prescription medicines for pain, discomfort, or fever as directed by your health care provider.  Do simple hand squeezing exercises with a soft rubber ball to decrease hand swelling. Contact a health care  provider if:  Your shoulder pain increases, or new pain or numbness develops in your arm, hand, or fingers.  Your hand or fingers are colder than your other hand. Get help right away if:  Your arm, hand, or fingers are numb or tingling.  Your arm, hand, or fingers are increasingly swollen and painful, or they turn white or blue. This information is not intended to replace advice given to you by your health care provider. Make sure you discuss any questions you have with your health care provider. Document Released: 12/25/1999 Document Revised: 06/04/2015 Document Reviewed: 08/08/2012 Elsevier Interactive Patient Education  2018 [employer].

## 2017-11-29 NOTE — Progress Notes (Signed)
FOLLOW UP VISIT : MRI RESULTS   Chief Complaint  Patient presents with  . Results    review MRI      HPI: The patient is here TO DISCUSS THE RESULTS OF MRI  72 year old female with 1 month history of right shoulder pain no improvement sent for MRI MRI shows multiple tendon tears with retraction glenohumeral arthritis and atrophy.  Patient says she still cannot sleep at night has functional deficits including grooming and household chores.  Chief Complaint  Patient presents with  . Shoulder Pain    Right shoulder no injury 1 mo pain    45 female presents for evaluation of painful right shoulder x1 month.  She did take some prednisone orally from her primary care doctor which helped.  She does not report any trauma  She reports 8 out of 10 pain decreased range of motion with internal rotation and flexion describes the pain as a dull toothache increased pain at night.     Review of Systems  Musculoskeletal: Positive for back pain and joint pain.  Neurological: Positive for focal weakness. Negative for tingling, sensory change and weakness.   ROS Change since October 28  BP (!) 147/89   Pulse 68   Ht 5\' 9"  (1.753 m)   Wt 170 lb (77.1 kg)   BMI 25.10 kg/m     Medical decision-making section   DATA  MRI REPORT:  CLINICAL DATA:  Right shoulder pain for the past 2 months.   EXAM: MRI OF THE RIGHT SHOULDER WITHOUT CONTRAST   TECHNIQUE: Multiplanar, multisequence MR imaging of the shoulder was performed. No intravenous contrast was administered.   COMPARISON:  Right shoulder x-rays dated November 06, 2017.   FINDINGS: Rotator cuff: Full-thickness, full width tear of the supraspinatus tendon with 3.5 cm retraction to the medial humeral head. Full-thickness, near full width tear of the infraspinatus tendon with 3.5 cm retraction to the medial humeral head. A few posterior infraspinatus tendon fibers may remain intact. Small partial-thickness bursal surface tear  of the distal subscapularis tendon. The teres minor tendon is intact.   Muscles: Mild-to-moderate supraspinatus and infraspinatus muscle atrophy. Mild subscapularis muscle fatty infiltration. Mild supraspinatus and infraspinatus muscle edema.   Biceps long head: Possible split tear of the intra-articular portion. Normally positioned.   Acromioclavicular Joint: Mild arthropathy of the acromioclavicular joint. Type I, laterally downsloping acromion. Large subacromial/subdeltoid bursal fluid extending into the subcoracoid bursa, with synovitis.   Glenohumeral Joint: Moderate joint effusion. Moderate degenerative changes of the glenohumeral joint with areas of full-thickness cartilage loss over the humeral head.   Labrum:  Circumferentially degenerated.   Bones: No acute fracture or dislocation. Subchondral marrow edema in the posterior humeral head. No focal bone lesion.   Other: None.   IMPRESSION: 1. Full-thickness, full width tear of the supraspinatus tendon with mild to moderate muscle atrophy. 2. Full-thickness, near full width tear of the infraspinatus tendon with mild to moderate muscle atrophy. 3. Small partial thickness bursal surface tear of the distal subscapularis tendon. 4. Possible split tear of the intra-articular biceps tendon. 5. 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 6. Prominent subacromial/subdeltoid bursitis.     Electronically Signed   By: Titus Dubin M.D.   On: 11/10/2017 12:44    MY READING: MRI OF THE   #1 mild glenohumeral joint arthritis #2 supraspinatus infraspinatus complete tear with partial subscapularis tear with mild atrophy #3 tendons are retracted to the glenoid approximately 3-1/2 cm   Encounter Diagnoses  Name Primary?  Marland Kitchen  Chronic right shoulder pain Yes  . Nontraumatic tear of right rotator cuff, unspecified tear extent   . Primary osteoarthritis of right shoulder   . Lumbar pain with radiation down  right leg     PLAN:   rec open cuff repair right shoulder with AFLEX GRAFT, ARTHREX ANCHORS // SPEED BRIDGE  The procedure has been fully reviewed with the patient; The risks and benefits of surgery have been discussed and explained and understood. Alternative treatment has also been reviewed, questions were encouraged and answered. The postoperative plan is also been reviewed.  OPIOID COUNSELING GIVEN secondary to chronic opioid management for spinal stenosis.  Database checked patient received 120 hydrocodone 10 mg from [facility] on November 10.  I refilled her prescription for her shoulder pain.  Meds ordered this encounter  Medications  . HYDROcodone-acetaminophen (NORCO) 10-325 MG tablet    Sig: Take 1 tablet by mouth every 6 (six) hours as needed. As needed moderate - severe pain    Dispense:  60 tablet    Refill:  0

## 2017-12-01 ENCOUNTER — Other Ambulatory Visit: Payer: Self-pay | Admitting: *Deleted

## 2017-12-01 DIAGNOSIS — R413 Other amnesia: Secondary | ICD-10-CM

## 2017-12-01 MED ORDER — DONEPEZIL HCL 10 MG PO TABS: 10.0000 mg | ORAL_TABLET | Freq: Every day | ORAL | 1 refills | Status: DC

## 2017-12-01 NOTE — Telephone Encounter (Signed)
[place]

## 2017-12-26 ENCOUNTER — Ambulatory Visit: Payer: Self-pay

## 2017-12-27 ENCOUNTER — Ambulatory Visit: Payer: Medicare Other

## 2018-01-17 DIAGNOSIS — I10 Essential (primary) hypertension: Secondary | ICD-10-CM | POA: Diagnosis not present

## 2018-01-17 DIAGNOSIS — M5416 Radiculopathy, lumbar region: Secondary | ICD-10-CM | POA: Diagnosis not present

## 2018-01-17 DIAGNOSIS — M48062 Spinal stenosis, lumbar region with neurogenic claudication: Secondary | ICD-10-CM | POA: Diagnosis not present

## 2018-01-22 ENCOUNTER — Encounter: Payer: Self-pay | Admitting: Family

## 2018-01-22 ENCOUNTER — Encounter: Payer: Medicare Other | Admitting: Family

## 2018-01-22 ENCOUNTER — Ambulatory Visit (INDEPENDENT_AMBULATORY_CARE_PROVIDER_SITE_OTHER): Payer: Medicare Other | Admitting: Family

## 2018-01-22 VITALS — BP 130/80 | HR 75 | Temp 98.3°F | Resp 18 | Ht 69.0 in | Wt 175.8 lb

## 2018-01-22 DIAGNOSIS — J069 Acute upper respiratory infection, unspecified: Secondary | ICD-10-CM

## 2018-01-22 NOTE — Progress Notes (Signed)
Provider: Lalita Ebel FNP-C  Lauree Chandler, NP  Patient Care Team: Lauree Chandler, NP as PCP - General (Geriatric Medicine)  Extended Emergency Contact Information Primary Emergency Contact: Janene Harvey of [place] Mobile Phone: [phone] Relation: Son Secondary Emergency Contact: Edwena Felty Address: [place], [place] [address] [place] Phone: [phone] Relation: Spouse   Goals of care: Advanced Directive information Advanced Directives 01/22/2018  Does Patient Have a Medical Advance Directive? No  Type of Advance Directive -  Copy of [place] in Chart? -  Would patient like information on creating a medical advance directive? No - Patient declined     Chief Complaint  Patient presents with  . Acute Visit    Patient c/o cold, coughing and sore throat, nasal drainage, since Wednesday of last week    HPI:  Pt is a 73 y.o. female seen today for an acute visit for evaluation of cough,sore throat and nasal drainage X 6 days.she has taken allegra but stopped after starting on Mucinex cough syrup.she states coughed up greenish mucus upon taking mucinex.she denies any fever,wheezing,shortness of breath or chest pain.   Past Medical History:  Diagnosis Date  . Allergy   . Anemia   . Arthritis   . Hypertension    Past Surgical History:  Procedure Laterality Date  . COLONOSCOPY    . JOINT REPLACEMENT  2008  . SARK 2009     Dr. Jolee Ewing     Allergies  Allergen Reactions  . Celecoxib     REACTION: blood pressure went up  . Codeine Itching  . Fexofenadine Other (See Comments)    unknown  . Penicillins Rash    Outpatient Encounter Medications as of 01/22/2018  Medication Sig  . amLODipine (NORVASC) 10 MG tablet TAKE 1/2 (ONE-HALF) TABLET BY MOUTH ONCE DAILY  . donepezil (ARICEPT) 10 MG tablet Take 1 tablet (10 mg total) by mouth at bedtime.  Marland Kitchen HYDROcodone-acetaminophen  (NORCO) 10-325 MG tablet Take 1 tablet by mouth every 6 (six) hours as needed. As needed moderate - severe pain  . lisinopril (PRINIVIL,ZESTRIL) 40 MG tablet TAKE 1 TABLET BY MOUTH ONCE DAILY  . meloxicam (MOBIC) 7.5 MG tablet Take 1 tablet (7.5 mg total) by mouth daily.  Marland Kitchen PARoxetine (PAXIL) 20 MG tablet TAKE 1 TABLET BY MOUTH ONCE DAILY   No facility-administered encounter medications on file as of 01/22/2018.     Review of Systems  Constitutional: Negative for appetite change, chills and fever.  HENT: Positive for rhinorrhea and sore throat. Negative for congestion, ear pain, facial swelling, postnasal drip, sinus pressure, sinus pain, sneezing and tinnitus.   Eyes: Negative for discharge, redness and itching.  Respiratory: Positive for cough. Negative for chest tightness, shortness of breath and wheezing.   Cardiovascular: Negative for chest pain, palpitations and leg swelling.  Gastrointestinal: Negative for diarrhea, nausea and vomiting.  Skin: Negative for color change, pallor and rash.  Neurological: Negative for dizziness, light-headedness and headaches.   Immunization History  Administered Date(s) Administered  . Influenza, High Dose Seasonal PF 11/24/2017  . Influenza,inj,Quad PF,6+ Mos 09/24/2015, 12/15/2016  . Pneumococcal Conjugate-13 12/16/2015  . Td 01/17/2017   Pertinent  Health Maintenance Due  Topic Date Due  . PNA vac Low Risk Adult (2 of 2 - PPSV23) 12/15/2016  . INFLUENZA VACCINE  08/10/2017  . MAMMOGRAM  09/06/2018  . COLONOSCOPY  10/17/2021  . DEXA  SCAN  Completed   Fall Risk  01/22/2018 01/22/2018 05/15/2017 04/18/2017 03/08/2017  Falls in the past year? 0 0 No No No  Number falls in past yr: 0 0 - - -  Comment - - - - -  Injury with Fall? 0 0 - - -    Vitals:   01/22/18 1359  BP: 130/80  Pulse: 75  Resp: 18  Temp: 98.3 F (36.8 C)  TempSrc: Oral  SpO2: 97%  Weight: 175 lb 12.8 oz (79.7 kg)  Height: 5\' 9"  (1.753 m)   Body mass index is 25.96  kg/m. Physical Exam Vitals signs reviewed.  Constitutional:      General: She is not in acute distress.    Appearance: Normal appearance. She is not ill-appearing.  HENT:     Right Ear: Tympanic membrane, ear canal and external ear normal. There is no impacted cerumen.     Left Ear: Tympanic membrane, ear canal and external ear normal. There is no impacted cerumen.     Nose: Rhinorrhea present. No congestion.     Mouth/Throat:     Mouth: Mucous membranes are moist.     Pharynx: Oropharynx is clear. No oropharyngeal exudate or posterior oropharyngeal erythema.  Eyes:     General: No scleral icterus.       Right eye: No discharge.        Left eye: No discharge.     Extraocular Movements: Extraocular movements intact.     Conjunctiva/sclera: Conjunctivae normal.     Pupils: Pupils are equal, round, and reactive to light.  Neck:     Musculoskeletal: Normal range of motion. No neck rigidity or muscular tenderness.  Cardiovascular:     Rate and Rhythm: Normal rate and regular rhythm.     Pulses: Normal pulses.     Heart sounds: Normal heart sounds. No murmur. No friction rub. No gallop.   Pulmonary:     Effort: Pulmonary effort is normal. No respiratory distress.     Breath sounds: Normal breath sounds. No stridor. No wheezing, rhonchi or rales.  Chest:     Chest wall: No tenderness.  Abdominal:     General: Abdomen is flat. Bowel sounds are normal. There is no distension.     Palpations: Abdomen is soft. There is no mass.     Tenderness: There is no abdominal tenderness. There is no guarding or rebound.  Musculoskeletal: Normal range of motion.        General: No swelling or tenderness.     Right lower leg: No edema.     Left lower leg: No edema.  Lymphadenopathy:     Cervical: No cervical adenopathy.  Skin:    General: Skin is warm and dry.     Coloration: Skin is not jaundiced or pale.     Findings: No bruising, erythema or rash.  Neurological:     Mental Status: She is  alert and oriented to person, place, and time.     Motor: No weakness.     Gait: Gait normal.  Psychiatric:        Mood and Affect: Mood normal.        Behavior: Behavior normal.        Thought Content: Thought content normal.        Judgment: Judgment normal.     Labs reviewed: Recent Labs    07/26/17 1507 08/03/17 1518  NA 141 141  K 3.8 4.3  CL 105 105  CO2 25 28  GLUCOSE 101 98  BUN 25 34*  CREATININE 1.47* 1.40*  CALCIUM 9.9 9.9   Recent Labs    07/26/17 1507  AST 11  ALT 6  BILITOT 0.3  PROT 6.7   Recent Labs    07/26/17 1507  WBC 7.6  HGB 10.8*  HCT 34.4*  MCV 78.5*  PLT 358   Lab Results  Component Value Date   TSH 0.87 01/17/2017    Significant Diagnostic Results in last 30 days:  No results found.  Assessment/Plan  Upper respiratory infection with cough and congestion Afebrile.bilateral lungs Clear to auscultation.clear rhinorrhea noted.Recommend starting on over the counter allegra or Claritin  oneTablet daily by mouth daily x 14 days. Continue OTC mucinex cough syrup every 8 hours as needed for cough.Please Notify provider's office if running a fever 100.5 or if no improvement.Drink plenty of fluid and soup.    Family/ staff Communication: Reviewed plan of care with patient  Labs/tests ordered: None   Bert Givans C Carmie Lanpher, NP

## 2018-01-22 NOTE — Patient Instructions (Signed)
1. Notify provider's office if running a fever 100.5 or greater 2. Take over the counter Claritin 10 mg tablet daily x 14 days 3. Get plenty of rest  4. Drink plenty of fluid/soup    Upper Respiratory Infection, Adult An upper respiratory infection (URI) affects the nose, throat, and upper air passages. URIs are caused by germs (viruses). The most common type of URI is often called "the common cold." Medicines cannot cure URIs, but you can do things at home to relieve your symptoms. URIs usually get better within 7-10 days. Follow these instructions at home: Activity  Rest as needed.  If you have a fever, stay home from work or school until your fever is gone, or until your doctor says you may return to work or school. ? You should stay home until you cannot spread the infection anymore (you are not contagious). ? Your doctor may have you wear a face mask so you have less risk of spreading the infection. Relieving symptoms  Gargle with a salt-water mixture 3-4 times a day or as needed. To make a salt-water mixture, completely dissolve -1 tsp of salt in 1 cup of warm water.  Use a cool-mist humidifier to add moisture to the air. This can help you breathe more easily. Eating and drinking   Drink enough fluid to keep your pee (urine) pale yellow.  Eat soups and other clear broths. General instructions   Take over-the-counter and prescription medicines only as told by your doctor. These include cold medicines, fever reducers, and cough suppressants.  Do not use any products that contain nicotine or tobacco. These include cigarettes and e-cigarettes. If you need help quitting, ask your doctor.  Avoid being where people are smoking (avoid secondhand smoke).  Make sure you get regular shots and get the flu shot every year.  Keep all follow-up visits as told by your doctor. This is important. How to avoid spreading infection to others   Wash your hands often with soap and water.  If you do not have soap and water, use hand sanitizer.  Avoid touching your mouth, face, eyes, or nose.  Cough or sneeze into a tissue or your sleeve or elbow. Do not cough or sneeze into your hand or into the air. Contact a doctor if:  You are getting worse, not better.  You have any of these: ? A fever. ? Chills. ? Brown or red mucus in your nose. ? Yellow or brown fluid (discharge)coming from your nose. ? Pain in your face, especially when you bend forward. ? Swollen neck glands. ? Pain with swallowing. ? White areas in the back of your throat. Get help right away if:  You have shortness of breath that gets worse.  You have very bad or constant: ? Headache. ? Ear pain. ? Pain in your forehead, behind your eyes, and over your cheekbones (sinus pain). ? Chest pain.  You have long-lasting (chronic) lung disease along with any of these: ? Wheezing. ? Long-lasting cough. ? Coughing up blood. ? A change in your usual mucus.  You have a stiff neck.  You have changes in your: ? Vision. ? Hearing. ? Thinking. ? Mood. Summary  An upper respiratory infection (URI) is caused by a germ called a virus. The most common type of URI is often called "the common cold."  URIs usually get better within 7-10 days.  Take over-the-counter and prescription medicines only as told by your doctor. This information is not intended to replace advice  given to you by your health care provider. Make sure you discuss any questions you have with your health care provider. Document Released: 06/15/2007 Document Revised: 08/19/2016 Document Reviewed: 08/19/2016 Elsevier Interactive Patient Education  2019 [employer].

## 2018-01-23 NOTE — Progress Notes (Signed)
  This encounter was created in error - please disregard.Patient was scheduled for wellness visit but requested to be seen for acute visit.

## 2018-01-26 ENCOUNTER — Other Ambulatory Visit: Payer: Self-pay | Admitting: *Deleted

## 2018-01-26 DIAGNOSIS — M545 Low back pain, unspecified: Secondary | ICD-10-CM

## 2018-01-26 DIAGNOSIS — M79604 Pain in right leg: Secondary | ICD-10-CM

## 2018-01-26 MED ORDER — HYDROCODONE-ACETAMINOPHEN 10-325 MG PO TABS: 1.0000 | ORAL_TABLET | Freq: Four times a day (QID) | ORAL | 0 refills | Status: DC | PRN

## 2018-01-26 NOTE — Telephone Encounter (Signed)
Also patient does not have follow up. Needs to see provider (dinah or myself) in office. There will be no additional refills until she makes follow up.

## 2018-01-26 NOTE — Telephone Encounter (Signed)
Patient notified and agreed. Stated that she will call back to schedule an appointment.

## 2018-01-26 NOTE — Telephone Encounter (Signed)
Noted, if she continues to get refills from other office we will no longer be able to fill.

## 2018-01-26 NOTE — Telephone Encounter (Signed)
Patient called and requested refill NCCSRS Database Verified LR: 11/29/17 by Dr. Darrol Angel (Ortho.)  Reminded patient that she signed a Narcotic Contract with our office stating she could not get Narcotics from other providers. She stated it was for her shoulder and she only got "like 15"  (per database #[id number]). Advised patient not to accept Narcotic Rx's from other providers due to contract she signed at our office. Pt. Agreed.   Pended Rx and sent to [facility] for approval.

## 2018-02-19 ENCOUNTER — Encounter: Payer: Medicare Other | Admitting: Family

## 2018-02-28 ENCOUNTER — Other Ambulatory Visit: Payer: Self-pay | Admitting: *Deleted

## 2018-02-28 DIAGNOSIS — F4329 Adjustment disorder with other symptoms: Secondary | ICD-10-CM

## 2018-02-28 MED ORDER — PAROXETINE HCL 20 MG PO TABS: 20.0000 mg | ORAL_TABLET | Freq: Every day | ORAL | 1 refills | Status: DC

## 2018-02-28 NOTE — Telephone Encounter (Signed)
Patient requested refill.   Pended and sent to [facility] for approval due to [place].

## 2018-03-05 ENCOUNTER — Encounter: Payer: Self-pay | Admitting: Family

## 2018-03-30 ENCOUNTER — Other Ambulatory Visit: Payer: Self-pay | Admitting: *Deleted

## 2018-03-30 DIAGNOSIS — I10 Essential (primary) hypertension: Secondary | ICD-10-CM

## 2018-03-30 MED ORDER — AMLODIPINE BESYLATE 10 MG PO TABS: ORAL_TABLET | ORAL | 1 refills | Status: DC

## 2018-03-30 NOTE — Telephone Encounter (Signed)
[facility]

## 2018-04-14 ENCOUNTER — Other Ambulatory Visit: Payer: Self-pay | Admitting: Internal Medicine

## 2018-04-14 DIAGNOSIS — I10 Essential (primary) hypertension: Secondary | ICD-10-CM

## 2018-04-16 ENCOUNTER — Other Ambulatory Visit: Payer: Self-pay | Admitting: *Deleted

## 2018-04-16 DIAGNOSIS — M79604 Pain in right leg: Secondary | ICD-10-CM

## 2018-04-16 DIAGNOSIS — I10 Essential (primary) hypertension: Secondary | ICD-10-CM

## 2018-04-16 DIAGNOSIS — M545 Low back pain, unspecified: Secondary | ICD-10-CM

## 2018-04-16 MED ORDER — HYDROCODONE-ACETAMINOPHEN 10-325 MG PO TABS: 1.0000 | ORAL_TABLET | Freq: Four times a day (QID) | ORAL | 0 refills | Status: DC | PRN

## 2018-04-16 NOTE — Telephone Encounter (Signed)
Patient called requesting refill [place] Verified LR: 01/26/2018 Pended Rx and sent to [facility] for approval.

## 2018-05-02 DIAGNOSIS — M48062 Spinal stenosis, lumbar region with neurogenic claudication: Secondary | ICD-10-CM | POA: Diagnosis not present

## 2018-05-02 DIAGNOSIS — M5416 Radiculopathy, lumbar region: Secondary | ICD-10-CM | POA: Diagnosis not present

## 2018-06-23 ENCOUNTER — Other Ambulatory Visit: Payer: Self-pay | Admitting: Nurse Practitioner

## 2018-06-23 DIAGNOSIS — R413 Other amnesia: Secondary | ICD-10-CM

## 2018-06-25 ENCOUNTER — Encounter: Payer: Medicare Other | Admitting: Family

## 2018-06-26 ENCOUNTER — Encounter: Payer: Medicare Other | Admitting: Family

## 2018-06-27 ENCOUNTER — Other Ambulatory Visit: Payer: Self-pay

## 2018-06-27 ENCOUNTER — Encounter: Payer: Self-pay | Admitting: Family

## 2018-06-27 ENCOUNTER — Ambulatory Visit (INDEPENDENT_AMBULATORY_CARE_PROVIDER_SITE_OTHER): Payer: Medicare Other | Admitting: Nurse Practitioner

## 2018-06-27 ENCOUNTER — Encounter: Payer: Self-pay | Admitting: Nurse Practitioner

## 2018-06-27 VITALS — BP 130/88 | HR 84 | Temp 98.3°F | Resp 20 | Ht 69.0 in | Wt 182.4 lb

## 2018-06-27 VITALS — BP 130/88 | HR 84 | Temp 98.3°F | Resp 20 | Ht 69.0 in | Wt 183.4 lb

## 2018-06-27 DIAGNOSIS — Z0000 Encounter for general adult medical examination without abnormal findings: Secondary | ICD-10-CM

## 2018-06-27 DIAGNOSIS — I10 Essential (primary) hypertension: Secondary | ICD-10-CM

## 2018-06-27 DIAGNOSIS — M545 Low back pain, unspecified: Secondary | ICD-10-CM

## 2018-06-27 DIAGNOSIS — Z13220 Encounter for screening for lipoid disorders: Secondary | ICD-10-CM

## 2018-06-27 DIAGNOSIS — Z23 Encounter for immunization: Secondary | ICD-10-CM | POA: Diagnosis not present

## 2018-06-27 DIAGNOSIS — D649 Anemia, unspecified: Secondary | ICD-10-CM

## 2018-06-27 DIAGNOSIS — N183 Chronic kidney disease, stage 3 unspecified: Secondary | ICD-10-CM

## 2018-06-27 DIAGNOSIS — M79604 Pain in right leg: Secondary | ICD-10-CM

## 2018-06-27 DIAGNOSIS — F3341 Major depressive disorder, recurrent, in partial remission: Secondary | ICD-10-CM

## 2018-06-27 DIAGNOSIS — R413 Other amnesia: Secondary | ICD-10-CM

## 2018-06-27 MED ORDER — ZOSTER VAC RECOMB ADJUVANTED 50 MCG/0.5ML IM SUSR: 0.5000 mL | Freq: Once | INTRAMUSCULAR | 1 refills | Status: AC

## 2018-06-27 MED ORDER — HYDROCODONE-ACETAMINOPHEN 10-325 MG PO TABS: 1.0000 | ORAL_TABLET | Freq: Four times a day (QID) | ORAL | 0 refills | Status: DC | PRN

## 2018-06-27 NOTE — Patient Instructions (Signed)
Encouraged to increase physical activity 30 mins 5 days a week   DASH Eating Plan DASH stands for "Dietary Approaches to Stop Hypertension." The DASH eating plan is a healthy eating plan that has been shown to reduce high blood pressure (hypertension). It may also reduce your risk for type 2 diabetes, heart disease, and stroke. The DASH eating plan may also help with weight loss. What are tips for following this plan?  General guidelines  Avoid eating more than 2,300 mg (milligrams) of salt (sodium) a day. If you have hypertension, you may need to reduce your sodium intake to 1,500 mg a day.  Limit alcohol intake to no more than 1 drink a day for nonpregnant women and 2 drinks a day for men. One drink equals 12 oz of beer, 5 oz of wine, or 1 oz of hard liquor.  Work with your health care provider to maintain a healthy body weight or to lose weight. Ask what an ideal weight is for you.  Get at least 30 minutes of exercise that causes your heart to beat faster (aerobic exercise) most days of the week. Activities may include walking, swimming, or biking.  Work with your health care provider or diet and nutrition specialist (dietitian) to adjust your eating plan to your individual calorie needs. Reading food labels   Check food labels for the amount of sodium per serving. Choose foods with less than 5 percent of the Daily Value of sodium. Generally, foods with less than 300 mg of sodium per serving fit into this eating plan.  To find whole grains, look for the word "whole" as the first word in the ingredient list. Shopping  Buy products labeled as "low-sodium" or "no salt added."  Buy fresh foods. Avoid canned foods and premade or frozen meals. Cooking  Avoid adding salt when cooking. Use salt-free seasonings or herbs instead of table salt or sea salt. Check with your health care provider or pharmacist before using salt substitutes.  Do not fry foods. Cook foods using healthy methods  such as baking, boiling, grilling, and broiling instead.  Cook with heart-healthy oils, such as olive, canola, soybean, or sunflower oil. Meal planning  Eat a balanced diet that includes: ? 5 or more servings of fruits and vegetables each day. At each meal, try to fill half of your plate with fruits and vegetables. ? Up to 6-8 servings of whole grains each day. ? Less than 6 oz of lean meat, poultry, or fish each day. A 3-oz serving of meat is about the same size as a deck of cards. One egg equals 1 oz. ? 2 servings of low-fat dairy each day. ? A serving of nuts, seeds, or beans 5 times each week. ? Heart-healthy fats. Healthy fats called Omega-3 fatty acids are found in foods such as flaxseeds and coldwater fish, like sardines, salmon, and mackerel.  Limit how much you eat of the following: ? Canned or prepackaged foods. ? Food that is high in trans fat, such as fried foods. ? Food that is high in saturated fat, such as fatty meat. ? Sweets, desserts, sugary drinks, and other foods with added sugar. ? Full-fat dairy products.  Do not salt foods before eating.  Try to eat at least 2 vegetarian meals each week.  Eat more home-cooked food and less restaurant, buffet, and fast food.  When eating at a restaurant, ask that your food be prepared with less salt or no salt, if possible. What foods are recommended? The  items listed may not be a complete list. Talk with your dietitian about what dietary choices are best for you. Grains Whole-grain or whole-wheat bread. Whole-grain or whole-wheat pasta. Brown rice. [name]. Bulgur. Whole-grain and low-sodium cereals. Pita bread. Low-fat, low-sodium crackers. Whole-wheat flour tortillas. Vegetables Fresh or frozen vegetables (raw, steamed, roasted, or grilled). Low-sodium or reduced-sodium tomato and vegetable juice. Low-sodium or reduced-sodium tomato sauce and tomato paste. Low-sodium or reduced-sodium canned vegetables. Fruits All  fresh, dried, or frozen fruit. Canned fruit in natural juice (without added sugar). Meat and other protein foods Skinless chicken or [place]. Ground chicken or [place]. Pork with fat trimmed off. Fish and seafood. Egg whites. Dried beans, peas, or lentils. Unsalted nuts, nut butters, and seeds. Unsalted canned beans. Lean cuts of beef with fat trimmed off. Low-sodium, lean deli meat. Dairy Low-fat (1%) or fat-free (skim) milk. Fat-free, low-fat, or reduced-fat cheeses. Nonfat, low-sodium ricotta or cottage cheese. Low-fat or nonfat yogurt. Low-fat, low-sodium cheese. Fats and oils Soft margarine without trans fats. Vegetable oil. Low-fat, reduced-fat, or light mayonnaise and salad dressings (reduced-sodium). Canola, safflower, olive, soybean, and sunflower oils. Avocado. Seasoning and other foods Herbs. Spices. Seasoning mixes without salt. Unsalted popcorn and pretzels. Fat-free sweets. What foods are not recommended? The items listed may not be a complete list. Talk with your dietitian about what dietary choices are best for you. Grains Baked goods made with fat, such as croissants, muffins, or some breads. Dry pasta or rice meal packs. Vegetables Creamed or fried vegetables. Vegetables in a cheese sauce. Regular canned vegetables (not low-sodium or reduced-sodium). Regular canned tomato sauce and paste (not low-sodium or reduced-sodium). Regular tomato and vegetable juice (not low-sodium or reduced-sodium). [name]. Olives. Fruits Canned fruit in a light or heavy syrup. Fried fruit. Fruit in cream or butter sauce. Meat and other protein foods Fatty cuts of meat. Ribs. Fried meat. [name]. Sausage. Bologna and other processed lunch meats. Salami. Fatback. Hotdogs. Bratwurst. Salted nuts and seeds. Canned beans with added salt. Canned or smoked fish. Whole eggs or egg yolks. Chicken or [place] with skin. Dairy Whole or 2% milk, cream, and half-and-half. Whole or full-fat cream cheese. Whole-fat or  sweetened yogurt. Full-fat cheese. Nondairy creamers. Whipped toppings. Processed cheese and cheese spreads. Fats and oils Butter. Stick margarine. Lard. Shortening. Ghee. Bacon fat. Tropical oils, such as coconut, palm kernel, or palm oil. Seasoning and other foods Salted popcorn and pretzels. Onion salt, garlic salt, seasoned salt, table salt, and sea salt. Worcestershire sauce. Tartar sauce. Barbecue sauce. Teriyaki sauce. Soy sauce, including reduced-sodium. Steak sauce. Canned and packaged gravies. Fish sauce. Oyster sauce. Cocktail sauce. Horseradish that you find on the shelf. Ketchup. Mustard. Meat flavorings and tenderizers. Bouillon cubes. Hot sauce and Tabasco sauce. Premade or packaged marinades. Premade or packaged taco seasonings. Relishes. Regular salad dressings. Where to find more information:  National Heart, Lung, and [place]: https://wilson-eaton.com/  American Heart Association: www.heart.org Summary  The DASH eating plan is a healthy eating plan that has been shown to reduce high blood pressure (hypertension). It may also reduce your risk for type 2 diabetes, heart disease, and stroke.  With the DASH eating plan, you should limit salt (sodium) intake to 2,300 mg a day. If you have hypertension, you may need to reduce your sodium intake to 1,500 mg a day.  When on the DASH eating plan, aim to eat more fresh fruits and vegetables, whole grains, lean proteins, low-fat dairy, and heart-healthy fats.  Work with your health care provider  or diet and nutrition specialist (dietitian) to adjust your eating plan to your individual calorie needs. This information is not intended to replace advice given to you by your health care provider. Make sure you discuss any questions you have with your health care provider. Document Released: 12/16/2010 Document Revised: 12/21/2015 Document Reviewed: 12/21/2015 Elsevier Interactive Patient Education  2019 [employer].

## 2018-06-27 NOTE — Patient Instructions (Signed)
Kaylee Warner , Thank you for taking time to come for your Medicare Wellness Visit. I appreciate your ongoing commitment to your health goals. Please review the following plan we discussed and let me know if I can assist you in the future.   Screening recommendations/referrals: Colonoscopy up to date Mammogram due 08/2018 Bone Density due 08/2018 Recommended yearly ophthalmology/optometry visit for glaucoma screening and checkup Recommended yearly dental visit for hygiene and checkup  Vaccinations: Influenza vaccine due 08/2018 Pneumococcal vaccine completed today Tdap vaccine up to date Shingles vaccine sent to rx    Advanced directives: to complete and bring back to pharmacy  Conditions/risks identified: memory loss  Next appointment: 1 year   Preventive Care 20 Years and Older, Female Preventive care refers to lifestyle choices and visits with your health care provider that can promote health and wellness. What does preventive care include?  A yearly physical exam. This is also called an annual well check.  Dental exams once or twice a year.  Routine eye exams. Ask your health care provider how often you should have your eyes checked.  Personal lifestyle choices, including:  Daily care of your teeth and gums.  Regular physical activity.  Eating a healthy diet.  Avoiding tobacco and drug use.  Limiting alcohol use.  Practicing safe sex.  Taking low-dose aspirin every day.  Taking vitamin and mineral supplements as recommended by your health care provider. What happens during an annual well check? The services and screenings done by your health care provider during your annual well check will depend on your age, overall health, lifestyle risk factors, and family history of disease. Counseling  Your health care provider may ask you questions about your:  Alcohol use.  Tobacco use.  Drug use.  Emotional well-being.  Home and relationship well-being.  Sexual  activity.  Eating habits.  History of falls.  Memory and ability to understand (cognition).  Work and work Statistician.  Reproductive health. Screening  You may have the following tests or measurements:  Height, weight, and BMI.  Blood pressure.  Lipid and cholesterol levels. These may be checked every 5 years, or more frequently if you are over 27 years old.  Skin check.  Lung cancer screening. You may have this screening every year starting at age 10 if you have a 30-pack-year history of smoking and currently smoke or have quit within the past 15 years.  Fecal occult blood test (FOBT) of the stool. You may have this test every year starting at age 30.  Flexible sigmoidoscopy or colonoscopy. You may have a sigmoidoscopy every 5 years or a colonoscopy every 10 years starting at age 76.  Hepatitis C blood test.  Hepatitis B blood test.  Sexually transmitted disease (STD) testing.  Diabetes screening. This is done by checking your blood sugar (glucose) after you have not eaten for a while (fasting). You may have this done every 1-3 years.  Bone density scan. This is done to screen for osteoporosis. You may have this done starting at age 71.  Mammogram. This may be done every 1-2 years. Talk to your health care provider about how often you should have regular mammograms. Talk with your health care provider about your test results, treatment options, and if necessary, the need for more tests. Vaccines  Your health care provider may recommend certain vaccines, such as:  Influenza vaccine. This is recommended every year.  Tetanus, diphtheria, and acellular pertussis (Tdap, Td) vaccine. You may need a Td booster every 10  years.  Zoster vaccine. You may need this after age 29.  Pneumococcal 13-valent conjugate (PCV13) vaccine. One dose is recommended after age 68.  Pneumococcal polysaccharide (PPSV23) vaccine. One dose is recommended after age 25. Talk to your health care  provider about which screenings and vaccines you need and how often you need them. This information is not intended to replace advice given to you by your health care provider. Make sure you discuss any questions you have with your health care provider. Document Released: 01/23/2015 Document Revised: 09/16/2015 Document Reviewed: 10/28/2014 Elsevier Interactive Patient Education  2017 [place] Prevention in the Home Falls can cause injuries. They can happen to people of all ages. There are many things you can do to make your home safe and to help prevent falls. What can I do on the outside of my home?  Regularly fix the edges of walkways and driveways and fix any cracks.  Remove anything that might make you trip as you walk through a door, such as a raised step or threshold.  Trim any bushes or trees on the path to your home.  Use bright outdoor lighting.  Clear any walking paths of anything that might make someone trip, such as rocks or tools.  Regularly check to see if handrails are loose or broken. Make sure that both sides of any steps have handrails.  Any raised decks and porches should have guardrails on the edges.  Have any leaves, snow, or ice cleared regularly.  Use sand or salt on walking paths during winter.  Clean up any spills in your garage right away. This includes oil or grease spills. What can I do in the bathroom?  Use night lights.  Install grab bars by the toilet and in the tub and shower. Do not use towel bars as grab bars.  Use non-skid mats or decals in the tub or shower.  If you need to sit down in the shower, use a plastic, non-slip stool.  Keep the floor dry. Clean up any water that spills on the floor as soon as it happens.  Remove soap buildup in the tub or shower regularly.  Attach bath mats securely with double-sided non-slip rug tape.  Do not have throw rugs and other things on the floor that can make you trip. What can I do in  the bedroom?  Use night lights.  Make sure that you have a light by your bed that is easy to reach.  Do not use any sheets or blankets that are too big for your bed. They should not hang down onto the floor.  Have a firm chair that has side arms. You can use this for support while you get dressed.  Do not have throw rugs and other things on the floor that can make you trip. What can I do in the kitchen?  Clean up any spills right away.  Avoid walking on wet floors.  Keep items that you use a lot in easy-to-reach places.  If you need to reach something above you, use a strong step stool that has a grab bar.  Keep electrical cords out of the way.  Do not use floor polish or wax that makes floors slippery. If you must use wax, use non-skid floor wax.  Do not have throw rugs and other things on the floor that can make you trip. What can I do with my stairs?  Do not leave any items on the stairs.  Make sure that  there are handrails on both sides of the stairs and use them. Fix handrails that are broken or loose. Make sure that handrails are as long as the stairways.  Check any carpeting to make sure that it is firmly attached to the stairs. Fix any carpet that is loose or worn.  Avoid having throw rugs at the top or bottom of the stairs. If you do have throw rugs, attach them to the floor with carpet tape.  Make sure that you have a light switch at the top of the stairs and the bottom of the stairs. If you do not have them, ask someone to add them for you. What else can I do to help prevent falls?  Wear shoes that:  Do not have high heels.  Have rubber bottoms.  Are comfortable and fit you well.  Are closed at the toe. Do not wear sandals.  If you use a stepladder:  Make sure that it is fully opened. Do not climb a closed stepladder.  Make sure that both sides of the stepladder are locked into place.  Ask someone to hold it for you, if possible.  Clearly mark and  make sure that you can see:  Any grab bars or handrails.  First and last steps.  Where the edge of each step is.  Use tools that help you move around (mobility aids) if they are needed. These include:  Canes.  Walkers.  Scooters.  Crutches.  Turn on the lights when you go into a dark area. Replace any light bulbs as soon as they burn out.  Set up your furniture so you have a clear path. Avoid moving your furniture around.  If any of your floors are uneven, fix them.  If there are any pets around you, be aware of where they are.  Review your medicines with your doctor. Some medicines can make you feel dizzy. This can increase your chance of falling. Ask your doctor what other things that you can do to help prevent falls. This information is not intended to replace advice given to you by your health care provider. Make sure you discuss any questions you have with your health care provider. Document Released: 10/23/2008 Document Revised: 06/04/2015 Document Reviewed: 01/31/2014 Elsevier Interactive Patient Education  2017 [employer].

## 2018-06-27 NOTE — Addendum Note (Signed)
Addended by: Lauree Chandler on: 06/27/2018 02:12 PM   Modules accepted: Level of Service, SmartSet

## 2018-06-27 NOTE — Progress Notes (Signed)
Provider: Sandrea Hughs, NP  Patient Care Team: Ngetich, Nelda Bucks, NP as PCP - General (Family Medicine)  Extended Emergency Contact Information Primary Emergency Contact: [place] Mobile Phone: [phone] Relation: Son Secondary Emergency Contact: Fox,James R Address: [address]          [place], [place] [address] [place] Phone: [phone] Relation: Spouse Allergies  Allergen Reactions  . Celecoxib     REACTION: blood pressure went up  . Codeine Itching  . Fexofenadine Other (See Comments)    unknown  . Penicillins Rash   Code Status: FULL Goals of Care: Advanced Directive information Advanced Directives 06/27/2018  Does Patient Have a Medical Advance Directive? No  Type of Advance Directive -  Copy of [place] in Chart? -  Would patient like information on creating a medical advance directive? No - Patient declined     Chief Complaint  Patient presents with  . Annual Exam    Physical     HPI: Patient is a 73 y.o. female seen in today for an annual wellness exam.    Diet? Low fat diet Exercise?not much recently since gym has been closed.  Dentition: every 6 months Ophthalmology appt: yearly Routine specialist: neurosurgery due to spinal stenosis.   Spinal stenosis- pain controlled with injections from pain specialist and hydrocodone/apap, last refill 04/16/2018 Having more leg cramps at night-not has active Having a harder time sleeping- better when she was doing water aerobics.    Depression screen [facility] 2/9 06/27/2018 01/22/2018 01/17/2017 12/15/2016 12/16/2015  Decreased Interest 0 0 0 0 0  Down, Depressed, Hopeless 0 0 0 0 1  PHQ - 2 Score 0 0 0 0 1    Fall Risk  06/27/2018 01/22/2018 01/22/2018 05/15/2017 04/18/2017  Falls in the past year? 0 0 0 No No  Number falls in past yr: 0 0 0 - -  Comment - - - - -  Injury with Fall? 0 0 0 - -   MMSE - Mini Mental State Exam 06/27/2018  12/15/2016 12/16/2015 05/01/2015  Not completed: - - - (No Data)  Orientation to time 5 3 5 4   Orientation to Place 5 5 5 4   Registration 3 3 3 3   Attention/ Calculation 5 5 5 5   Recall 3 2 3 3   Language- name 2 objects 2 2 2 2   Language- repeat 1 1 1 1   Language- follow 3 step command 3 3 3 3   Language- read & follow direction 1 1 1 1   Write a sentence 1 1 1 1   Copy design 1 1 1 1   Total score 30 27 30 28      Health Maintenance  Topic Date Due  . Hepatitis C Screening  May 14, 1945  . PNA vac Low Risk Adult (2 of 2 - PPSV23) 12/15/2016  . INFLUENZA VACCINE  08/11/2018  . MAMMOGRAM  09/06/2018  . COLONOSCOPY  10/17/2021  . TETANUS/TDAP  01/18/2027  . DEXA SCAN  Completed    Past Medical History:  Diagnosis Date  . Allergy   . Anemia   . Arthritis   . Hypertension     Past Surgical History:  Procedure Laterality Date  . COLONOSCOPY    . JOINT REPLACEMENT  2008  . SARK 2009     Dr. Jolee Ewing     Social History   Socioeconomic History  . Marital status: Married    Spouse name: Not on file  .  Number of children: Not on file  . Years of education: Not on file  . Highest education level: Not on file  Occupational History  . Occupation: Training and development officer   Social Needs  . Financial resource strain: Not hard at all  . Food insecurity    Worry: Never true    Inability: Never true  . Transportation needs    Medical: No    Non-medical: No  Tobacco Use  . Smoking status: Never Smoker  . Smokeless tobacco: Never Used  Substance and Sexual Activity  . Alcohol use: Yes    Alcohol/week: 7.0 standard drinks    Types: 7 Glasses of wine per week    Comment: 1 Glass a day   . Drug use: No  . Sexual activity: Never  Lifestyle  . Physical activity    Days per week: 1 day    Minutes per session: 30 min  . Stress: To some extent  Relationships  . Social connections    Talks on phone: More than three times a week    Gets together: More than three times a week    Attends religious  service: 1 to 4 times per year    Active member of club or organization: Yes    Attends meetings of clubs or organizations: 1 to 4 times per year    Relationship status: Married  Other Topics Concern  . Not on file  Social History Narrative   Diet:Regular   Do you drink/eat things with caffeine? Yes   Marital status:  Married                             What year were you married?    Do you live in a house, apartment, assisted living, condo, trailer, etc)? House   Is it one or more stories? 2   How many persons live in your home? 2  Do you have any pets in your home? No   Current or past profession: Regulatory affairs officer   Do you exercise?   Yes                                                  Type & how often: stretches   Do you have a living will? No    Do you have a DNR Form?  No   Do you have a POA/HPOA forms? No    Family History  Problem Relation Age of Onset  . Diabetes Sister   . Sjogren's syndrome Daughter   . Diabetes Son   . Hypertension Son   . Diabetes Son   . Diabetes Other        family history   . Arthritis Other        family history   . Colon polyps Neg Hx   . Colon cancer Neg Hx   . Esophageal cancer Neg Hx   . Stomach cancer Neg Hx   . Rectal cancer Neg Hx     Review of Systems:  Review of Systems  Constitutional: Positive for malaise/fatigue. Negative for chills, fever and weight loss.  HENT: Negative for congestion and hearing loss.   Eyes: Negative for blurred vision.  Respiratory: Negative for cough and shortness of breath.   Cardiovascular: Negative for chest pain and leg swelling.  Gastrointestinal:  Positive for constipation (controlled with OTC). Negative for abdominal pain, diarrhea and heartburn.  Genitourinary: Positive for frequency (at night).  Musculoskeletal: Positive for back pain.  Psychiatric/Behavioral: Positive for memory loss. Negative for depression. The patient is nervous/anxious and has insomnia.      Allergies as of 06/27/2018       Reactions   Celecoxib    REACTION: blood pressure went up   Codeine Itching   Fexofenadine Other (See Comments)   unknown   Penicillins Rash      Medication List       Accurate as of June 27, 2018  2:13 PM. If you have any questions, ask your nurse or doctor.        amLODipine 10 MG tablet Commonly known as: NORVASC Take 1/2 tablet by mouth once daily   donepezil 10 MG tablet Commonly known as: ARICEPT TAKE 1 TABLET BY MOUTH AT BEDTIME   HYDROcodone-acetaminophen 10-325 MG tablet Commonly known as: Norco Take 1 tablet by mouth every 6 (six) hours as needed. As needed moderate - severe pain   lisinopril 40 MG tablet Commonly known as: ZESTRIL Take 1 tablet by mouth once daily   meloxicam 7.5 MG tablet Commonly known as: Mobic Take 1 tablet (7.5 mg total) by mouth daily.   PARoxetine 20 MG tablet Commonly known as: PAXIL Take 1 tablet (20 mg total) by mouth daily.   Zoster Vaccine Adjuvanted injection Commonly known as: SHINGRIX Inject 0.5 mLs into the muscle once for 1 dose.         Physical Exam: Vitals:   06/27/18 1344  BP: 130/88  Pulse: 84  Resp: 20  Temp: 98.3 F (36.8 C)  TempSrc: Oral  SpO2: 96%  Weight: 183 lb 6.4 oz (83.2 kg)  Height: 5\' 9"  (1.753 m)   Body mass index is 27.08 kg/m. Wt Readings from Last 3 Encounters:  06/27/18 183 lb 6.4 oz (83.2 kg)  01/22/18 175 lb 12.8 oz (79.7 kg)  11/29/17 170 lb (77.1 kg)    Physical Exam  Labs reviewed: Basic Metabolic Panel: Recent Labs    07/26/17 1507 08/03/17 1518  NA 141 141  K 3.8 4.3  CL 105 105  CO2 25 28  GLUCOSE 101 98  BUN 25 34*  CREATININE 1.47* 1.40*  CALCIUM 9.9 9.9   Liver Function Tests: Recent Labs    07/26/17 1507  AST 11  ALT 6  BILITOT 0.3  PROT 6.7   No results for input(s): LIPASE, AMYLASE in the last 8760 hours. No results for input(s): AMMONIA in the last 8760 hours. CBC: Recent Labs    07/26/17 1507  WBC 7.6  HGB 10.8*  HCT 34.4*   MCV 78.5*  PLT 358   Lipid Panel: No results for input(s): CHOL, HDL, LDLCALC, TRIG, CHOLHDL, LDLDIRECT in the last 8760 hours. No results found for: HGBA1C  Procedures: No results found.  Assessment/Plan 1. Essential hypertension -controlled on current regimen, continue medication with dietary modification and encouraged to increase physical activity as tolerates.  - COMPLETE METABOLIC PANEL WITH GFR - CBC with Differential/Platelet  2. Need for pneumococcal vaccination - Pneumococcal polysaccharide vaccine 23-valent greater than or equal to 2yo subcutaneous/IM  3. Preventative health care -AWV completed today -education provided on diet and exercise  - Lipid panel -The patient was counseled regarding the appropriate use of alcohol, regular self-examination of the breasts on a monthly basis, prevention of dental and periodontal disease, diet, regular sustained exercise for at least 30 minutes  5 times per week, routine screening interval for mammogram as recommended by the [place] and ACOG, importance of regular PAP smears, colonoscopy, cholesterol, thyroid and diabetes screening  4. Screening for lipid disorders Reports strong family hx of lipid disorder.  - Lipid panel  5. Lumbar pain with radiation down right leg -controlled on current regimen.  - HYDROcodone-acetaminophen (NORCO) 10-325 MG tablet; Take 1 tablet by mouth every 6 (six) hours as needed. As needed moderate - severe pain  Dispense: 60 tablet; Refill: 0  6. Recurrent major depressive disorder, in partial remission (HCC) Stable. Feels like depression is in control with paxil 20 mg daily   7. 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 States she does have a hard time remembering feels like stress related to husband's health contributes.   8. Anemia, unspecified type Will follow up CBC today  9. CKD (chronic kidney disease) stage 3, GFR 30-59 ml/min (HCC) Will follow up BMP, she is on mobic but if  renal function continues to worsen would recommended to stop this medication. Encouraged to continue proper hydration.   Next appt: 4 months for routine follow up [place] [place] [phone]

## 2018-06-27 NOTE — Progress Notes (Signed)
Subjective:   Kaylee Warner is a 73 y.o. female who presents for Medicare Annual (Subsequent) preventive examination.  Review of Systems:   Cardiac Risk Factors include: advanced age (>45men, >65 women);hypertension     Objective:     Vitals: BP 130/88   Pulse 84   Temp 98.3 F (36.8 C) (Oral)   Resp 20   Ht 5\' 9"  (1.753 m)   Wt 182 lb 6.4 oz (82.7 kg)   SpO2 96%   BMI 26.94 kg/m   Body mass index is 26.94 kg/m.  Advanced Directives 06/27/2018 06/27/2018 01/22/2018 05/15/2017 04/18/2017 02/17/2017 12/15/2016  Does Patient Have a Medical Advance Directive? No No No No No No No  Type of Advance Directive - - - - - - -  Copy of Healthcare Power of Attorney in Chart? - - - - - - -  Would patient like information on creating a medical advance directive? No - Patient declined No - Patient declined No - Patient declined Yes (MAU/Ambulatory/Procedural Areas - Information given) - - Yes (MAU/Ambulatory/Procedural Areas - Information given)    Tobacco Social History   Tobacco Use  Smoking Status Never Smoker  Smokeless Tobacco Never Used     Counseling given: Not Answered   Clinical Intake:  Pre-visit preparation completed: Yes  Pain : No/denies pain     BMI - recorded: 26.94 Nutritional Status: BMI 25 -29 Overweight Nutritional Risks: None Diabetes: No  How often do you need to have someone help you when you read instructions, pamphlets, or other written materials from your doctor or pharmacy?: 3 - Sometimes What is the last grade level you completed in school?: 12th grade  Interpreter Needed?: No     Past Medical History:  Diagnosis Date  . Allergy   . Anemia   . Arthritis   . Hypertension    Past Surgical History:  Procedure Laterality Date  . COLONOSCOPY    . JOINT REPLACEMENT  2008  . SARK 2009     Dr. Jolee Ewing    Family History  Problem Relation Age of Onset  . Diabetes Sister   . Sjogren's syndrome Daughter   . Diabetes Son   . Hypertension Son    . Diabetes Son   . Diabetes Other        family history   . Arthritis Other        family history   . Colon polyps Neg Hx   . Colon cancer Neg Hx   . Esophageal cancer Neg Hx   . Stomach cancer Neg Hx   . Rectal cancer Neg Hx    Social History   Socioeconomic History  . Marital status: Married    Spouse name: Not on file  . Number of children: Not on file  . Years of education: Not on file  . Highest education level: Not on file  Occupational History  . Occupation: Training and development officer   Social Needs  . Financial resource strain: Not hard at all  . Food insecurity    Worry: Never true    Inability: Never true  . Transportation needs    Medical: No    Non-medical: No  Tobacco Use  . Smoking status: Never Smoker  . Smokeless tobacco: Never Used  Substance and Sexual Activity  . Alcohol use: Yes    Alcohol/week: 7.0 standard drinks    Types: 7 Glasses of wine per week    Comment: 1 Glass a day   . Drug use: No  .  Sexual activity: Never  Lifestyle  . Physical activity    Days per week: 1 day    Minutes per session: 30 min  . Stress: To some extent  Relationships  . Social connections    Talks on phone: More than three times a week    Gets together: More than three times a week    Attends religious service: 1 to 4 times per year    Active member of club or organization: Yes    Attends meetings of clubs or organizations: 1 to 4 times per year    Relationship status: Married  Other Topics Concern  . Not on file  Social History Narrative   Diet:Regular   Do you drink/eat things with caffeine? Yes   Marital status:  Married                             What year were you married?    Do you live in a house, apartment, assisted living, condo, trailer, etc)? House   Is it one or more stories? 2   How many persons live in your home? 2  Do you have any pets in your home? No   Current or past profession: Regulatory affairs officer   Do you exercise?   Yes                                                   Type & how often: stretches   Do you have a living will? No    Do you have a DNR Form?  No   Do you have a POA/HPOA forms? No    Outpatient Encounter Medications as of 06/27/2018  Medication Sig  . amLODipine (NORVASC) 10 MG tablet Take 1/2 tablet by mouth once daily  . donepezil (ARICEPT) 10 MG tablet TAKE 1 TABLET BY MOUTH AT BEDTIME  . HYDROcodone-acetaminophen (NORCO) 10-325 MG tablet Take 1 tablet by mouth every 6 (six) hours as needed. As needed moderate - severe pain  . lisinopril (PRINIVIL,ZESTRIL) 40 MG tablet Take 1 tablet by mouth once daily  . meloxicam (MOBIC) 7.5 MG tablet Take 1 tablet (7.5 mg total) by mouth daily.  Marland Kitchen PARoxetine (PAXIL) 20 MG tablet Take 1 tablet (20 mg total) by mouth daily.  . [DISCONTINUED] Zoster Vaccine Adjuvanted [facility]) injection Inject 0.5 mLs into the muscle once.   No facility-administered encounter medications on file as of 06/27/2018.     Activities of Daily Living In your present state of health, do you have any difficulty performing the following activities: 06/27/2018  Hearing? N  Vision? N  Difficulty concentrating or making decisions? Y  Walking or climbing stairs? N  Dressing or bathing? N  Doing errands, shopping? N  Preparing Food and eating ? N  Using the Toilet? N  In the past six months, have you accidently leaked urine? N  Do you have problems with loss of bowel control? N  Managing your Medications? N  Managing your Finances? N  Housekeeping or managing your Housekeeping? N  Some recent data might be hidden    Patient Care Team: Ngetich, Nelda Bucks, NP as PCP - General (Family Medicine)    Assessment:   This is a routine wellness examination for [name].  Exercise Activities and Dietary recommendations Current Exercise Habits: Home  exercise routine, Type of exercise: stretching, Time (Minutes): 10, Frequency (Times/Week): 1, Weekly Exercise (Minutes/Week): 10, Intensity: Mild  Goals    . Exercise 3x per week  (30 min per time)     Would like to increase exercise to 3 times weekly for 30 mins.     . Patient Stated     Patient will increase water intake    . Reduce fat intake to X grams per day     Starting 12/16/15, I will attempt to decrease my fat intake, (ie. Eating less snacks foods)        Fall Risk Fall Risk  06/27/2018 01/22/2018 01/22/2018 05/15/2017 04/18/2017  Falls in the past year? 0 0 0 No No  Number falls in past yr: 0 0 0 - -  Comment - - - - -  Injury with Fall? 0 0 0 - -   Is the patient's home free of loose throw rugs in walkways, pet beds, electrical cords, etc?   yes      Grab bars in the bathroom? no      Handrails on the stairs?   yes      Adequate lighting?   yes  Timed Get Up and Go performed: na  Depression Screen PHQ 2/9 Scores 06/27/2018 01/22/2018 01/17/2017 12/15/2016  PHQ - 2 Score 0 0 0 0     Cognitive Function MMSE - Mini Mental State Exam 06/27/2018 12/15/2016 12/16/2015 05/01/2015  Not completed: - - - (No Data)  Orientation to time 5 3 5 4   Orientation to Place 5 5 5 4   Registration 3 3 3 3   Attention/ Calculation 5 5 5 5   Recall 3 2 3 3   Language- name 2 objects 2 2 2 2   Language- repeat 1 1 1 1   Language- follow 3 step command 3 3 3 3   Language- read & follow direction 1 1 1 1   Write a sentence 1 1 1 1   Copy design 1 1 1 1   Total score 30 27 30 28         Immunization History  Administered Date(s) Administered  . Influenza, High Dose Seasonal PF 11/24/2017  . Influenza,inj,Quad PF,6+ Mos 09/24/2015, 12/15/2016  . Pneumococcal Conjugate-13 12/16/2015  . Td 01/17/2017    Qualifies for Shingles Vaccine?yes   Screening Tests Health Maintenance  Topic Date Due  . Hepatitis C Screening  01-02-1946  . PNA vac Low Risk Adult (2 of 2 - PPSV23) 12/15/2016  . INFLUENZA VACCINE  08/11/2018  . MAMMOGRAM  09/06/2018  . COLONOSCOPY  10/17/2021  . TETANUS/TDAP  01/18/2027  . DEXA SCAN  Completed    Cancer Screenings: Lung: Low Dose CT Chest  recommended if Age 3-80 years, 30 pack-year currently smoking OR have quit w/in 15years. Patient does not qualify. Breast:  Up to date on Mammogram? Yes   Up to date of Bone Density/Dexa? Yes Colorectal: up to date  Additional Screenings:  Hepatitis C Screening: na     Plan:      I have personally reviewed and noted the following in the patient's chart:   . Medical and social history . Use of alcohol, tobacco or illicit drugs  . Current medications and supplements . Functional ability and status . Nutritional status . Physical activity . Advanced directives . List of other physicians . Hospitalizations, surgeries, and ER visits in previous 12 months . Vitals . Screenings to include cognitive, depression, and falls . Referrals and appointments  In addition, I have reviewed  and discussed with patient certain preventive protocols, quality metrics, and best practice recommendations. A written personalized care plan for preventive services as well as general preventive health recommendations were provided to patient.     Lauree Chandler, NP  06/27/2018

## 2018-06-28 ENCOUNTER — Encounter: Payer: Self-pay | Admitting: Nurse Practitioner

## 2018-06-28 ENCOUNTER — Other Ambulatory Visit: Payer: Self-pay | Admitting: Nurse Practitioner

## 2018-06-28 DIAGNOSIS — E782 Mixed hyperlipidemia: Secondary | ICD-10-CM

## 2018-06-28 DIAGNOSIS — D649 Anemia, unspecified: Secondary | ICD-10-CM

## 2018-06-28 LAB — CBC WITH DIFFERENTIAL/PLATELET
Absolute Monocytes: 518 cells/uL (ref 200–950)
Basophils Absolute: 38 cells/uL (ref 0–200)
Basophils Relative: 0.6 %
Eosinophils Absolute: 173 cells/uL (ref 15–500)
Eosinophils Relative: 2.7 %
HCT: 42.5 % (ref 35.0–45.0)
Hemoglobin: 13 g/dL (ref 11.7–15.5)
Lymphs Abs: 2214 cells/uL (ref 850–3900)
MCH: 23.5 pg — ABNORMAL LOW (ref 27.0–33.0)
MCHC: 30.6 g/dL — ABNORMAL LOW (ref 32.0–36.0)
MCV: 76.9 fL — ABNORMAL LOW (ref 80.0–100.0)
MPV: 10.6 fL (ref 7.5–12.5)
Monocytes Relative: 8.1 %
Neutro Abs: 3456 cells/uL (ref 1500–7800)
Neutrophils Relative %: 54 %
Platelets: 367 10*3/uL (ref 140–400)
RBC: 5.53 10*6/uL — ABNORMAL HIGH (ref 3.80–5.10)
RDW: 15.9 % — ABNORMAL HIGH (ref 11.0–15.0)
Total Lymphocyte: 34.6 %
WBC: 6.4 10*3/uL (ref 3.8–10.8)

## 2018-06-28 LAB — COMPLETE METABOLIC PANEL WITH GFR
AG Ratio: 1.6 (calc) (ref 1.0–2.5)
ALT: 12 U/L (ref 6–29)
AST: 17 U/L (ref 10–35)
Albumin: 4.5 g/dL (ref 3.6–5.1)
Alkaline phosphatase (APISO): 81 U/L (ref 37–153)
BUN: 11 mg/dL (ref 7–25)
CO2: 23 mmol/L (ref 20–32)
Calcium: 9.8 mg/dL (ref 8.6–10.4)
Chloride: 106 mmol/L (ref 98–110)
Creat: 0.63 mg/dL (ref 0.60–0.93)
GFR, Est African American: 103 mL/min/{1.73_m2} (ref >=60)
GFR, Est Non African American: 89 mL/min/{1.73_m2} (ref >=60)
Globulin: 2.8 g/dL (calc) (ref 1.9–3.7)
Glucose, Bld: 85 mg/dL (ref 65–99)
Potassium: 4.3 mmol/L (ref 3.5–5.3)
Sodium: 140 mmol/L (ref 135–146)
Total Bilirubin: 0.6 mg/dL (ref 0.2–1.2)
Total Protein: 7.3 g/dL (ref 6.1–8.1)

## 2018-06-28 LAB — LIPID PANEL
Cholesterol: 230 mg/dL — ABNORMAL HIGH (ref <200)
HDL: 58 mg/dL (ref >=50)
LDL Cholesterol (Calc): 147 mg/dL (calc) — ABNORMAL HIGH
Non-HDL Cholesterol (Calc): 172 mg/dL (calc) — ABNORMAL HIGH (ref <130)
Total CHOL/HDL Ratio: 4 (calc) (ref <5.0)
Triglycerides: 127 mg/dL (ref <150)

## 2018-07-30 ENCOUNTER — Other Ambulatory Visit: Payer: Self-pay | Admitting: Internal Medicine

## 2018-07-30 DIAGNOSIS — I10 Essential (primary) hypertension: Secondary | ICD-10-CM

## 2018-08-09 ENCOUNTER — Other Ambulatory Visit: Payer: Self-pay

## 2018-08-09 DIAGNOSIS — M79604 Pain in right leg: Secondary | ICD-10-CM

## 2018-08-09 DIAGNOSIS — M545 Low back pain: Secondary | ICD-10-CM

## 2018-08-09 MED ORDER — HYDROCODONE-ACETAMINOPHEN 10-325 MG PO TABS: 1.0000 | ORAL_TABLET | Freq: Four times a day (QID) | ORAL | 0 refills | Status: DC | PRN

## 2018-08-09 NOTE — Telephone Encounter (Signed)
Last office visit 06/27/2018 Last refill 06/27/2018 Routed to provider for refill approval

## 2018-09-11 ENCOUNTER — Other Ambulatory Visit: Payer: Self-pay | Admitting: Family

## 2018-09-11 DIAGNOSIS — M5416 Radiculopathy, lumbar region: Secondary | ICD-10-CM | POA: Diagnosis not present

## 2018-09-11 DIAGNOSIS — M179 Osteoarthritis of knee, unspecified: Secondary | ICD-10-CM

## 2018-09-11 DIAGNOSIS — M545 Low back pain: Secondary | ICD-10-CM

## 2018-09-11 DIAGNOSIS — F4329 Adjustment disorder with other symptoms: Secondary | ICD-10-CM

## 2018-09-11 DIAGNOSIS — M48062 Spinal stenosis, lumbar region with neurogenic claudication: Secondary | ICD-10-CM | POA: Diagnosis not present

## 2018-09-11 DIAGNOSIS — M79604 Pain in right leg: Secondary | ICD-10-CM

## 2018-09-11 DIAGNOSIS — M1710 Unilateral primary osteoarthritis, unspecified knee: Secondary | ICD-10-CM

## 2018-09-11 MED ORDER — MELOXICAM 7.5 MG PO TABS: 7.5000 mg | ORAL_TABLET | Freq: Every day | ORAL | 3 refills | Status: DC

## 2018-09-11 MED ORDER — HYDROCODONE-ACETAMINOPHEN 10-325 MG PO TABS: 1.0000 | ORAL_TABLET | Freq: Four times a day (QID) | ORAL | 0 refills | Status: DC | PRN

## 2018-09-11 NOTE — Telephone Encounter (Signed)
Patient requested refills Pended and sent to [facility] for approval.  [place] Database Verified LR: 08/09/2018

## 2018-10-08 ENCOUNTER — Other Ambulatory Visit: Payer: Self-pay | Admitting: Family

## 2018-10-08 DIAGNOSIS — I10 Essential (primary) hypertension: Secondary | ICD-10-CM

## 2018-10-08 DIAGNOSIS — R413 Other amnesia: Secondary | ICD-10-CM

## 2018-10-24 ENCOUNTER — Other Ambulatory Visit: Payer: Medicare Other

## 2018-10-24 ENCOUNTER — Other Ambulatory Visit: Payer: Self-pay

## 2018-10-24 DIAGNOSIS — E782 Mixed hyperlipidemia: Secondary | ICD-10-CM

## 2018-10-24 DIAGNOSIS — D649 Anemia, unspecified: Secondary | ICD-10-CM

## 2018-10-25 LAB — COMPLETE METABOLIC PANEL WITH GFR
AG Ratio: 1.9 (calc) (ref 1.0–2.5)
ALT: 9 U/L (ref 6–29)
AST: 13 U/L (ref 10–35)
Albumin: 4.1 g/dL (ref 3.6–5.1)
Alkaline phosphatase (APISO): 64 U/L (ref 37–153)
BUN: 11 mg/dL (ref 7–25)
CO2: 26 mmol/L (ref 20–32)
Calcium: 9.2 mg/dL (ref 8.6–10.4)
Chloride: 108 mmol/L (ref 98–110)
Creat: 0.6 mg/dL (ref 0.60–0.93)
GFR, Est African American: 105 mL/min/{1.73_m2} (ref >=60)
GFR, Est Non African American: 90 mL/min/{1.73_m2} (ref >=60)
Globulin: 2.2 g/dL (calc) (ref 1.9–3.7)
Glucose, Bld: 96 mg/dL (ref 65–99)
Potassium: 4 mmol/L (ref 3.5–5.3)
Sodium: 142 mmol/L (ref 135–146)
Total Bilirubin: 0.7 mg/dL (ref 0.2–1.2)
Total Protein: 6.3 g/dL (ref 6.1–8.1)

## 2018-10-25 LAB — TEST AUTHORIZATION

## 2018-10-25 LAB — CBC WITH DIFFERENTIAL/PLATELET
Absolute Monocytes: 323 cells/uL (ref 200–950)
Basophils Absolute: 21 cells/uL (ref 0–200)
Basophils Relative: 0.4 %
Eosinophils Absolute: 201 cells/uL (ref 15–500)
Eosinophils Relative: 3.8 %
HCT: 38.5 % (ref 35.0–45.0)
Hemoglobin: 12.1 g/dL (ref 11.7–15.5)
Lymphs Abs: 1654 cells/uL (ref 850–3900)
MCH: 24.1 pg — ABNORMAL LOW (ref 27.0–33.0)
MCHC: 31.4 g/dL — ABNORMAL LOW (ref 32.0–36.0)
MCV: 76.7 fL — ABNORMAL LOW (ref 80.0–100.0)
MPV: 11 fL (ref 7.5–12.5)
Monocytes Relative: 6.1 %
Neutro Abs: 3101 cells/uL (ref 1500–7800)
Neutrophils Relative %: 58.5 %
Platelets: 317 10*3/uL (ref 140–400)
RBC: 5.02 10*6/uL (ref 3.80–5.10)
RDW: 16 % — ABNORMAL HIGH (ref 11.0–15.0)
Total Lymphocyte: 31.2 %
WBC: 5.3 10*3/uL (ref 3.8–10.8)

## 2018-10-25 LAB — LIPID PANEL
Cholesterol: 212 mg/dL — ABNORMAL HIGH (ref <200)
HDL: 50 mg/dL (ref >=50)
LDL Cholesterol (Calc): 141 mg/dL (calc) — ABNORMAL HIGH
Non-HDL Cholesterol (Calc): 162 mg/dL (calc) — ABNORMAL HIGH (ref <130)
Total CHOL/HDL Ratio: 4.2 (calc) (ref <5.0)
Triglycerides: 99 mg/dL (ref <150)

## 2018-10-25 LAB — IRON,TIBC AND FERRITIN PANEL
%SAT: 35 % (calc) (ref 16–45)
Ferritin: 48 ng/mL (ref 16–288)
Iron: 91 ug/dL (ref 45–160)
TIBC: 258 mcg/dL (calc) (ref 250–450)

## 2018-10-29 ENCOUNTER — Ambulatory Visit: Payer: Medicare Other | Admitting: Nurse Practitioner

## 2018-10-29 ENCOUNTER — Other Ambulatory Visit: Payer: Self-pay

## 2018-10-29 ENCOUNTER — Ambulatory Visit (INDEPENDENT_AMBULATORY_CARE_PROVIDER_SITE_OTHER): Payer: Medicare Other | Admitting: Family

## 2018-10-29 ENCOUNTER — Encounter: Payer: Self-pay | Admitting: Family

## 2018-10-29 VITALS — BP 122/78 | HR 82 | Temp 97.5°F | Ht 69.0 in | Wt 182.4 lb

## 2018-10-29 DIAGNOSIS — M79604 Pain in right leg: Secondary | ICD-10-CM

## 2018-10-29 DIAGNOSIS — Z23 Encounter for immunization: Secondary | ICD-10-CM | POA: Diagnosis not present

## 2018-10-29 DIAGNOSIS — M549 Dorsalgia, unspecified: Secondary | ICD-10-CM

## 2018-10-29 DIAGNOSIS — G4700 Insomnia, unspecified: Secondary | ICD-10-CM

## 2018-10-29 DIAGNOSIS — R413 Other amnesia: Secondary | ICD-10-CM

## 2018-10-29 DIAGNOSIS — I10 Essential (primary) hypertension: Secondary | ICD-10-CM | POA: Diagnosis not present

## 2018-10-29 DIAGNOSIS — E782 Mixed hyperlipidemia: Secondary | ICD-10-CM

## 2018-10-29 DIAGNOSIS — M545 Low back pain, unspecified: Secondary | ICD-10-CM

## 2018-10-29 DIAGNOSIS — F321 Major depressive disorder, single episode, moderate: Secondary | ICD-10-CM

## 2018-10-29 DIAGNOSIS — M25511 Pain in right shoulder: Secondary | ICD-10-CM

## 2018-10-29 DIAGNOSIS — Z1159 Encounter for screening for other viral diseases: Secondary | ICD-10-CM

## 2018-10-29 DIAGNOSIS — G8929 Other chronic pain: Secondary | ICD-10-CM

## 2018-10-29 MED ORDER — HYDROCODONE-ACETAMINOPHEN 10-325 MG PO TABS: 1.0000 | ORAL_TABLET | Freq: Four times a day (QID) | ORAL | 0 refills | Status: DC | PRN

## 2018-10-29 MED ORDER — MELATONIN 3 MG PO TABS: 3.0000 mg | ORAL_TABLET | Freq: Every day | ORAL | 3 refills | Status: DC
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
## 2018-11-22 ENCOUNTER — Encounter: Payer: Self-pay | Admitting: Family

## 2018-11-22 ENCOUNTER — Other Ambulatory Visit: Payer: Self-pay

## 2018-11-22 ENCOUNTER — Ambulatory Visit (INDEPENDENT_AMBULATORY_CARE_PROVIDER_SITE_OTHER): Payer: Medicare Other | Admitting: Family

## 2018-11-22 VITALS — Ht 69.0 in | Wt 182.4 lb

## 2018-11-22 DIAGNOSIS — J3489 Other specified disorders of nose and nasal sinuses: Secondary | ICD-10-CM

## 2018-11-22 DIAGNOSIS — R05 Cough: Secondary | ICD-10-CM | POA: Diagnosis not present

## 2018-11-22 DIAGNOSIS — R059 Cough, unspecified: Secondary | ICD-10-CM

## 2018-11-22 NOTE — Progress Notes (Signed)
This service is provided via telemedicine  No vital signs collected/recorded due to the encounter was a telemedicine visit.   Location of patient (ex: home, work):  Home   Patient consents to a telephone visit:  Yes  Location of the provider (ex: office, home):  Office   Name of any referring provider:  Marlowe Sax, NP   Names of all persons participating in the telemedicine service and their role in the encounter:  Marlowe Sax, NP, Bonney Leitz, [place], and Amil Amen   Time spent on call: Bonney Leitz, CMA, spent minutes on phone with patient.    Provider: Marlowe Sax FNP-C  Ngetich, Nelda Bucks, NP  Patient Care Team: Ngetich, Nelda Bucks, NP as PCP - General (Family Medicine)  Extended Emergency Contact Information Primary Emergency Contact: [place] of [place] Mobile Phone: [phone] Relation: Son Secondary Emergency Contact: Setters,James R Address: [address]          [place], [place] [address] [place] Phone: [phone] Relation: Spouse  Code Status:  Full Code  Goals of care: Advanced Directive information Advanced Directives 11/22/2018  Does Patient Have a Medical Advance Directive? No  Type of Advance Directive -  Copy of [place] in Chart? -  Would patient like information on creating a medical advance directive? Yes (MAU/Ambulatory/Procedural Areas - Information given)     Chief Complaint  Patient presents with  . Acute Visit    Patient states complains of coughing and wheezing   . Quality Metric Gaps    Needs mammogram - would like to stay in [place]  . Best Practice Recommendations    Hepatitis C screening    HPI:  Pt is a 73 y.o. female seen today for an acute visit for evaluation of coughing and wheezing X 1 week.cough is non-productive.cough and wheezing only noted at night when lying down.cough wakes up at night.wheezing is described as audible the husband can hear it.she denies any  fever or chills.   Needs mammogram - would like to stay in [place] Hepatitis C screening currently in [place].will draw with next blood work.    Past Medical History:  Diagnosis Date  . Allergy   . Anemia   . Arthritis   . Hypertension    Past Surgical History:  Procedure Laterality Date  . COLONOSCOPY    . JOINT REPLACEMENT  2008  . SARK 2009     Dr. Jolee Ewing     Allergies  Allergen Reactions  . Celecoxib     REACTION: blood pressure went up  . Codeine Itching  . Fexofenadine Other (See Comments)    unknown  . Penicillins Rash    Outpatient Encounter Medications as of 11/22/2018  Medication Sig  . amLODipine (NORVASC) 10 MG tablet Take 1/2 (one-half) tablet by mouth once daily  . donepezil (ARICEPT) 10 MG tablet TAKE 1 TABLET BY MOUTH AT BEDTIME  . HYDROcodone-acetaminophen (NORCO) 10-325 MG tablet Take 1 tablet by mouth every 6 (six) hours as needed. As needed moderate - severe pain  . lisinopril (ZESTRIL) 40 MG tablet Take 1 tablet by mouth once daily  . Melatonin 3 MG TABS Take 1 tablet (3 mg total) by mouth at bedtime.  . meloxicam (MOBIC) 7.5 MG tablet Take 1 tablet (7.5 mg total) by mouth daily.  Marland Kitchen PARoxetine (PAXIL) 20 MG tablet Take 1 tablet by mouth once daily   No facility-administered encounter medications on file as of 11/22/2018.  Review of Systems  Constitutional: Negative for appetite change, chills, fatigue and fever.  HENT: Negative for congestion, hearing loss, rhinorrhea, sinus pressure, sinus pain, sneezing and sore throat.   Respiratory: Positive for cough and wheezing. Negative for chest tightness and shortness of breath.   Cardiovascular: Negative for chest pain, palpitations and leg swelling.  Gastrointestinal: Negative for abdominal distention, abdominal pain, constipation, diarrhea, nausea and vomiting.  Neurological: Negative for dizziness, light-headedness and headaches.    Immunization History  Administered Date(s) Administered  . Fluad  Quad(high Dose 65+) 10/29/2018  . Influenza, High Dose Seasonal PF 11/24/2017  . Influenza,inj,Quad PF,6+ Mos 09/24/2015, 12/15/2016  . Pneumococcal Conjugate-13 12/16/2015  . Pneumococcal Polysaccharide-23 06/27/2018  . Td 01/17/2017   Pertinent  Health Maintenance Due  Topic Date Due  . MAMMOGRAM  09/06/2018  . COLONOSCOPY  10/17/2021  . INFLUENZA VACCINE  Completed  . DEXA SCAN  Completed  . PNA vac Low Risk Adult  Completed   Fall Risk  11/22/2018 10/29/2018 06/27/2018 01/22/2018 01/22/2018  Falls in the past year? 0 0 0 0 0  Number falls in past yr: 0 0 0 0 0  Comment - - - - -  Injury with Fall? - 0 0 0 0    Vitals:   11/22/18 1559  Weight: 182 lb 6.4 oz (82.7 kg)  Height: 5\' 9"  (1.753 m)   Body mass index is 26.94 kg/m. Physical Exam  Unable to complete on telephone visit. Labs reviewed: Recent Labs    06/27/18 1432 10/24/18 0852  NA 140 142  K 4.3 4.0  CL 106 108  CO2 23 26  GLUCOSE 85 96  BUN 11 11  CREATININE 0.63 0.60  CALCIUM 9.8 9.2   Recent Labs    06/27/18 1432 10/24/18 0852  AST 17 13  ALT 12 9  BILITOT 0.6 0.7  PROT 7.3 6.3   Recent Labs    06/27/18 1432 10/24/18 0852  WBC 6.4 5.3  NEUTROABS 3,456 3,101  HGB 13.0 12.1  HCT 42.5 38.5  MCV 76.9* 76.7*  PLT 367 317   Lab Results  Component Value Date   TSH 0.87 01/17/2017   No results found for: HGBA1C Lab Results  Component Value Date   CHOL 212 (H) 10/24/2018   HDL 50 10/24/2018   LDLCALC 141 (H) 10/24/2018   TRIG 99 10/24/2018   CHOLHDL 4.2 10/24/2018    Significant Diagnostic Results in last 30 days:  No results found.  Assessment/Plan  1.Cough Afebrile.cough and wheezing worst with lying down at night.No abrupt weight gain,edema or shortness of breath.No acid reflux.suspect possible irritation from post nasal drainage.recommend chest X-ray to rule acute abnormalities but patient currently in [place].discussed with her to get evaluated at [facility] ED but patient  prefer to come to [facility] office next day for evaluation.   2. Rhinorrhea  Clear nasal drainage possible PND.Add Loratadine 10 mg tablet daily.   Family/ staff Communication: Reviewed plan of care with patient.   Labs/tests ordered: None   Spent 11 minutes of non-face to face with patient    Sandrea Hughs, NP

## 2018-11-26 ENCOUNTER — Ambulatory Visit: Payer: Medicare Other | Admitting: Family

## 2018-12-13 ENCOUNTER — Other Ambulatory Visit: Payer: Self-pay | Admitting: Internal Medicine

## 2018-12-13 DIAGNOSIS — Z1231 Encounter for screening mammogram for malignant neoplasm of breast: Secondary | ICD-10-CM

## 2018-12-19 ENCOUNTER — Other Ambulatory Visit: Payer: Self-pay | Admitting: Family

## 2018-12-19 DIAGNOSIS — F4329 Adjustment disorder with other symptoms: Secondary | ICD-10-CM

## 2018-12-19 NOTE — Telephone Encounter (Signed)
Pharmacy sent refill request.  Pended and forwarded to [facility] for approval due to [place].

## 2019-01-14 ENCOUNTER — Other Ambulatory Visit: Payer: Self-pay

## 2019-01-14 ENCOUNTER — Other Ambulatory Visit: Payer: Self-pay | Admitting: *Deleted

## 2019-01-14 DIAGNOSIS — M79604 Pain in right leg: Secondary | ICD-10-CM

## 2019-01-14 DIAGNOSIS — M545 Low back pain, unspecified: Secondary | ICD-10-CM

## 2019-01-14 MED ORDER — HYDROCODONE-ACETAMINOPHEN 10-325 MG PO TABS: 1.0000 | ORAL_TABLET | Freq: Four times a day (QID) | ORAL | 0 refills | Status: DC | PRN

## 2019-01-14 NOTE — Telephone Encounter (Signed)
Patient called and states she needs a refill on Hydrocodone. Patient next appointment for Narcotic Contract update is 04/29/2019. Unable to check [place] date base. Patient last refill was 10/29/2018. Medication pended and sent to Marlowe Sax, NP for approval.

## 2019-01-16 ENCOUNTER — Other Ambulatory Visit: Payer: Self-pay | Admitting: Family

## 2019-01-16 DIAGNOSIS — M1710 Unilateral primary osteoarthritis, unspecified knee: Secondary | ICD-10-CM

## 2019-01-16 DIAGNOSIS — M179 Osteoarthritis of knee, unspecified: Secondary | ICD-10-CM

## 2019-01-16 NOTE — Telephone Encounter (Signed)
High risk or very high risk warning populated when attempting to refill medication. RX request sent to PCP for review and approval if warranted.   

## 2019-02-04 ENCOUNTER — Telehealth (INDEPENDENT_AMBULATORY_CARE_PROVIDER_SITE_OTHER): Payer: Medicare Other | Admitting: Family

## 2019-02-04 ENCOUNTER — Encounter: Payer: Self-pay | Admitting: Family

## 2019-02-04 DIAGNOSIS — R05 Cough: Secondary | ICD-10-CM

## 2019-02-04 DIAGNOSIS — R519 Headache, unspecified: Secondary | ICD-10-CM

## 2019-02-04 DIAGNOSIS — Z20822 Contact with and (suspected) exposure to covid-19: Secondary | ICD-10-CM | POA: Diagnosis not present

## 2019-02-04 DIAGNOSIS — R52 Pain, unspecified: Secondary | ICD-10-CM | POA: Diagnosis not present

## 2019-02-04 MED ORDER — ZINC GLUCONATE 50 MG PO TABS: 50.0000 mg | ORAL_TABLET | Freq: Every day | ORAL | 0 refills | Status: AC

## 2019-02-04 MED ORDER — VITAMIN D3 125 MCG (5000 UT) PO CAPS: 1.0000 | ORAL_CAPSULE | Freq: Every morning | ORAL | 0 refills | Status: DC

## 2019-02-04 MED ORDER — ASCORBIC ACID 500 MG PO TABS: 250.0000 mg | ORAL_TABLET | Freq: Two times a day (BID) | ORAL | 0 refills | Status: DC

## 2019-02-04 NOTE — Progress Notes (Signed)
Patient ID: Kaylee Warner, female   DOB: 1945-02-01, 74 y.o.   MRN: [id number] This service is provided via telemedicine  No vital signs collected/recorded due to the encounter was a telemedicine visit.   Location of patient (ex: home, work):  home  Patient consents to a telephone visit:  yes  Location of the provider (ex: office, home):  office  Name of any referring provider:  Marlowe Sax, NP  Names of all persons participating in the telemedicine service and their role in the encounter:  Patient, [name], [name], Marlowe Sax, NP   Time spent on call:  4:29  Provider: Lakrisha Iseman FNP-C  Arliss Frisina, Nelda Bucks, NP  Patient Care Team: Theresia Pree, Nelda Bucks, NP as PCP - General (Family Medicine)  Extended Emergency Contact Information Primary Emergency Contact: [place] Phone: [phone] Relation: Son Secondary Emergency Contact: Muscato,James R Address: [address]          [place], [place] [address] [place] Phone: [phone] Relation: Spouse  Code Status: Full Code  Goals of care: Advanced Directive information Advanced Directives 11/22/2018  Does Patient Have a Medical Advance Directive? No  Type of Advance Directive -  Copy of [place] in Chart? -  Would patient like information on creating a medical advance directive? Yes (MAU/Ambulatory/Procedural Areas - Information given)     Chief Complaint  Patient presents with  . Acute Visit    dry cough, headache and no appetite and want to be tested for covid x2days    HPI:  Pt is a 74 y.o. female seen today for an acute visit for evaluation of dry cough,headache,loss of appetite x 3 days.States symptoms were very bad yesterday but has improved today.she took mucinex today which seems to have helped.Has no taste for food but has been drinking water and Ginger ale.she denies any wheezing,chest tightness or shortness of breath. She does not  recall any exposure to person sick with COVID-19.Stays at home and goes to [employer] once in a while but wears her mask and maintains social distancing.she would like to get tested for COVID-19 at a nearby pharmacy in [place].No order required from provider's office.she will update provider with test results.    Past Medical History:  Diagnosis Date  . Allergy   . Anemia   . Arthritis   . Hypertension    Past Surgical History:  Procedure Laterality Date  . COLONOSCOPY    . JOINT REPLACEMENT  2008  . SARK 2009     Dr. Jolee Ewing     Allergies  Allergen Reactions  . Celecoxib     REACTION: blood pressure went up  . Codeine Itching  . Fexofenadine Other (See Comments)    unknown  . Penicillins Rash    Outpatient Encounter Medications as of 02/04/2019  Medication Sig  . amLODipine (NORVASC) 10 MG tablet Take 1/2 (one-half) tablet by mouth once daily  . donepezil (ARICEPT) 10 MG tablet TAKE 1 TABLET BY MOUTH AT BEDTIME  . HYDROcodone-acetaminophen (NORCO) 10-325 MG tablet Take 1 tablet by mouth every 6 (six) hours as needed for moderate pain.  Marland Kitchen lisinopril (ZESTRIL) 40 MG tablet Take 1 tablet by mouth once daily  . Melatonin 3 MG TABS Take 1 tablet (3 mg total) by mouth at bedtime.  . meloxicam (MOBIC) 7.5 MG tablet Take 1 tablet by mouth once daily  . PARoxetine (PAXIL) 20 MG tablet Take 1 tablet by mouth once daily  No facility-administered encounter medications on file as of 02/04/2019.    Review of Systems  Constitutional: Positive for appetite change. Negative for chills, fatigue and fever.  HENT: Negative for congestion, rhinorrhea, sinus pressure, sinus pain, sneezing and sore throat.        No loss of taste or smell   Eyes: Negative for pain, discharge, redness and itching.  Respiratory: Positive for cough. Negative for chest tightness, shortness of breath and wheezing.        Dry cough   Cardiovascular: Negative for chest pain, palpitations and leg swelling.   Gastrointestinal: Negative for abdominal distention, abdominal pain, diarrhea, nausea and vomiting.  Endocrine: Negative for cold intolerance, heat intolerance, polydipsia, polyphagia and polyuria.  Genitourinary: Negative for difficulty urinating, dysuria, flank pain, frequency, hematuria and urgency.  Musculoskeletal: Negative for back pain, gait problem and myalgias.  Skin: Negative for color change, pallor and rash.  Neurological: Positive for headaches. Negative for dizziness, speech difficulty, weakness, light-headedness and numbness.  Hematological: Does not bruise/bleed easily.  Psychiatric/Behavioral: Negative for agitation and confusion. The patient is not nervous/anxious.     Immunization History  Administered Date(s) Administered  . Fluad Quad(high Dose 65+) 10/29/2018  . Influenza, High Dose Seasonal PF 11/24/2017  . Influenza,inj,Quad PF,6+ Mos 09/24/2015, 12/15/2016  . Pneumococcal Conjugate-13 12/16/2015  . Pneumococcal Polysaccharide-23 06/27/2018  . Td 01/17/2017   Pertinent  Health Maintenance Due  Topic Date Due  . MAMMOGRAM  09/06/2018  . COLONOSCOPY  10/17/2021  . INFLUENZA VACCINE  Completed  . DEXA SCAN  Completed  . PNA vac Low Risk Adult  Completed   Fall Risk  11/22/2018 10/29/2018 06/27/2018 01/22/2018 01/22/2018  Falls in the past year? 0 0 0 0 0  Number falls in past yr: 0 0 0 0 0  Comment - - - - -  Injury with Fall? - 0 0 0 0   There were no vitals filed for this visit. There is no height or weight on file to calculate BMI. Physical Exam Constitutional:      General: She is not in acute distress.    Appearance: She is not ill-appearing.  Eyes:     General: No scleral icterus.       Right eye: No discharge.        Left eye: No discharge.     Conjunctiva/sclera: Conjunctivae normal.  Pulmonary:     Effort: Pulmonary effort is normal.  Musculoskeletal:        General: Normal range of motion.     Right lower leg: No edema.     Left lower  leg: No edema.  Neurological:     Mental Status: She is alert and oriented to person, place, and time.     Gait: Gait normal.  Psychiatric:        Mood and Affect: Mood normal.        Behavior: Behavior normal.        Thought Content: Thought content normal.        Judgment: Judgment normal.     Labs reviewed: Recent Labs    06/27/18 1432 10/24/18 0852  NA 140 142  K 4.3 4.0  CL 106 108  CO2 23 26  GLUCOSE 85 96  BUN 11 11  CREATININE 0.63 0.60  CALCIUM 9.8 9.2   Recent Labs    06/27/18 1432 10/24/18 0852  AST 17 13  ALT 12 9  BILITOT 0.6 0.7  PROT 7.3 6.3   Recent Labs  06/27/18 1432 10/24/18 0852  WBC 6.4 5.3  NEUTROABS 3,456 3,101  HGB 13.0 12.1  HCT 42.5 38.5  MCV 76.9* 76.7*  PLT 367 317   Lab Results  Component Value Date   TSH 0.87 01/17/2017   No results found for: HGBA1C Lab Results  Component Value Date   CHOL 212 (H) 10/24/2018   HDL 50 10/24/2018   LDLCALC 141 (H) 10/24/2018   TRIG 99 10/24/2018   CHOLHDL 4.2 10/24/2018    Significant Diagnostic Results in last 30 days:  No results found.  Assessment/Plan 1. Encounter by telehealth for suspected COVID-19 Present with dry cough,loss of appetite ,headache and generalized body aches.suspect possible exposure to COVID-19 ? At the [employer].  - Advised to go for testing at [facility] but states will go to the nearby Pharmacy close to her house in [place] update provider of testing results. - vitamin C (VITAMIN C) 500 MG tablet; Take 0.5 tablets (250 mg total) by mouth 2 (two) times daily.  Dispense: 60 tablet; Refill: 0 - Cholecalciferol (VITAMIN D3) 125 MCG (5000 UT) CAPS; Take 1 capsule (5,000 Units total) by mouth every morning.  Dispense: 30 capsule; Refill: 0 - zinc gluconate 50 MG tablet; Take 1 tablet (50 mg total) by mouth daily for 14 days.  Dispense: 14 tablet; Refill: 0  - Education information provided on AVS and advised to continue with self Quarantine,social  distance,wear face mask and practice hand hygiene per CDC guidelines.   Family/ staff Communication: Reviewed plan of care with patient  Labs/tests ordered: None   Next Appointment: as needed  Spent 15 minutes of non-face to face with patient    Sandrea Hughs, NP

## 2019-02-04 NOTE — Patient Instructions (Signed)
- vitamin C (VITAMIN C) 500 MG tablet; Take 0.5 tablets (250 mg total) by mouth 2 (two) times daily.  Dispense: 60 tablet; Refill: 0 - Cholecalciferol (VITAMIN D3) 125 MCG (5000 UT) CAPS; Take 1 capsule (5,000 Units total) by mouth every morning.  Dispense: 30 capsule; Refill: 0 - zinc gluconate 50 MG tablet; Take 1 tablet (50 mg total) by mouth daily for 14 days.  Dispense: 14 tablet; Refill: 0 COVID-19 COVID-19 is a respiratory infection that is caused by a virus called severe acute respiratory syndrome coronavirus 2 (SARS-CoV-2). The disease is also known as coronavirus disease or novel coronavirus. In some people, the virus may not cause any symptoms. In others, it may cause a serious infection. The infection can get worse quickly and can lead to complications, such as:  Pneumonia, or infection of the lungs.  Acute respiratory distress syndrome or ARDS. This is a condition in which fluid build-up in the lungs prevents the lungs from filling with air and passing oxygen into the blood.  Acute respiratory failure. This is a condition in which there is not enough oxygen passing from the lungs to the body or when carbon dioxide is not passing from the lungs out of the body.  Sepsis or septic shock. This is a serious bodily reaction to an infection.  Blood clotting problems.  Secondary infections due to bacteria or fungus.  Organ failure. This is when your body's organs stop working. The virus that causes COVID-19 is contagious. This means that it can spread from person to person through droplets from coughs and sneezes (respiratory secretions). What are the causes? This illness is caused by a virus. You may catch the virus by:  Breathing in droplets from an infected person. Droplets can be spread by a person breathing, speaking, singing, coughing, or sneezing.  Touching something, like a table or a doorknob, that was exposed to the virus (contaminated) and then touching your mouth, nose, or  eyes. What increases the risk? Risk for infection You are more likely to be infected with this virus if you:  Are within 6 feet (2 meters) of a person with COVID-19.  Provide care for or live with a person who is infected with COVID-19.  Spend time in crowded indoor spaces or live in shared housing. Risk for serious illness You are more likely to become seriously ill from the virus if you:  Are 13 years of age or older. The higher your age, the more you are at risk for serious illness.  Live in a nursing home or long-term care facility.  Have cancer.  Have a long-term (chronic) disease such as: ? Chronic lung disease, including chronic obstructive pulmonary disease or asthma. ? A long-term disease that lowers your body's ability to fight infection (immunocompromised). ? Heart disease, including heart failure, a condition in which the arteries that lead to the heart become narrow or blocked (coronary artery disease), a disease which makes the heart muscle thick, weak, or stiff (cardiomyopathy). ? Diabetes. ? Chronic kidney disease. ? Sickle cell disease, a condition in which red blood cells have an abnormal "sickle" shape. ? Liver disease.  Are obese. What are the signs or symptoms? Symptoms of this condition can range from mild to severe. Symptoms may appear any time from 2 to 14 days after being exposed to the virus. They include:  A fever or chills.  A cough.  Difficulty breathing.  Headaches, body aches, or muscle aches.  Runny or stuffy (congested) nose.  A  sore throat.  New loss of taste or smell. Some people may also have stomach problems, such as nausea, vomiting, or diarrhea. Other people may not have any symptoms of COVID-19. How is this diagnosed? This condition may be diagnosed based on:  Your signs and symptoms, especially if: ? You live in an area with a COVID-19 outbreak. ? You recently traveled to or from an area where the virus is common. ? You  provide care for or live with a person who was diagnosed with COVID-19. ? You were exposed to a person who was diagnosed with COVID-19.  A physical exam.  Lab tests, which may include: ? Taking a sample of fluid from the back of your nose and throat (nasopharyngeal fluid), your nose, or your throat using a swab. ? A sample of mucus from your lungs (sputum). ? Blood tests.  Imaging tests, which may include, X-rays, CT scan, or ultrasound. How is this treated? At present, there is no medicine to treat COVID-19. Medicines that treat other diseases are being used on a trial basis to see if they are effective against COVID-19. Your health care provider will talk with you about ways to treat your symptoms. For most people, the infection is mild and can be managed at home with rest, fluids, and over-the-counter medicines. Treatment for a serious infection usually takes places in a hospital intensive care unit (ICU). It may include one or more of the following treatments. These treatments are given until your symptoms improve.  Receiving fluids and medicines through an IV.  Supplemental oxygen. Extra oxygen is given through a tube in the nose, a face mask, or a hood.  Positioning you to lie on your stomach (prone position). This makes it easier for oxygen to get into the lungs.  Continuous positive airway pressure (CPAP) or bi-level positive airway pressure (BPAP) machine. This treatment uses mild air pressure to keep the airways open. A tube that is connected to a motor delivers oxygen to the body.  Ventilator. This treatment moves air into and out of the lungs by using a tube that is placed in your windpipe.  Tracheostomy. This is a procedure to create a hole in the neck so that a breathing tube can be inserted.  Extracorporeal membrane oxygenation (ECMO). This procedure gives the lungs a chance to recover by taking over the functions of the heart and lungs. It supplies oxygen to the body and  removes carbon dioxide. Follow these instructions at home: Lifestyle  If you are sick, stay home except to get medical care. Your health care provider will tell you how long to stay home. Call your health care provider before you go for medical care.  Rest at home as told by your health care provider.  Do not use any products that contain nicotine or tobacco, such as cigarettes, e-cigarettes, and chewing tobacco. If you need help quitting, ask your health care provider.  Return to your normal activities as told by your health care provider. Ask your health care provider what activities are safe for you. General instructions  Take over-the-counter and prescription medicines only as told by your health care provider.  Drink enough fluid to keep your urine pale yellow.  Keep all follow-up visits as told by your health care provider. This is important. How is this prevented?  There is no vaccine to help prevent COVID-19 infection. However, there are steps you can take to protect yourself and others from this virus. To protect yourself:   Do  not travel to areas where COVID-19 is a risk. The areas where COVID-19 is reported change often. To identify high-risk areas and travel restrictions, check the CDC travel website: FatFares.com.br  If you live in, or must travel to, an area where COVID-19 is a risk, take precautions to avoid infection. ? Stay away from people who are sick. ? Wash your hands often with soap and water for 20 seconds. If soap and water are not available, use an alcohol-based hand sanitizer. ? Avoid touching your mouth, face, eyes, or nose. ? Avoid going out in public, follow guidance from your state and local health authorities. ? If you must go out in public, wear a cloth face covering or face mask. Make sure your mask covers your nose and mouth. ? Avoid crowded indoor spaces. Stay at least 6 feet (2 meters) away from others. ? Disinfect objects and surfaces  that are frequently touched every day. This may include:  Counters and tables.  Doorknobs and light switches.  Sinks and faucets.  Electronics, such as phones, remote controls, keyboards, computers, and tablets. To protect others: If you have symptoms of COVID-19, take steps to prevent the virus from spreading to others.  If you think you have a COVID-19 infection, contact your health care provider right away. Tell your health care team that you think you may have a COVID-19 infection.  Stay home. Leave your house only to seek medical care. Do not use public transport.  Do not travel while you are sick.  Wash your hands often with soap and water for 20 seconds. If soap and water are not available, use alcohol-based hand sanitizer.  Stay away from other members of your household. Let healthy household members care for children and pets, if possible. If you have to care for children or pets, wash your hands often and wear a mask. If possible, stay in your own room, separate from others. Use a different bathroom.  Make sure that all people in your household wash their hands well and often.  Cough or sneeze into a tissue or your sleeve or elbow. Do not cough or sneeze into your hand or into the air.  Wear a cloth face covering or face mask. Make sure your mask covers your nose and mouth. Where to find more information  Centers for Disease Control and Prevention: PurpleGadgets.be  World Health Organization: https://www.castaneda.info/ Contact a health care provider if:  You live in or have traveled to an area where COVID-19 is a risk and you have symptoms of the infection.  You have had contact with someone who has COVID-19 and you have symptoms of the infection. Get help right away if:  You have trouble breathing.  You have pain or pressure in your chest.  You have confusion.  You have bluish lips and fingernails.  You have difficulty  waking from sleep.  You have symptoms that get worse. These symptoms may represent a serious problem that is an emergency. Do not wait to see if the symptoms will go away. Get medical help right away. Call your local emergency services (911 in the [place] Do not drive yourself to the hospital. Let the emergency medical personnel know if you think you have COVID-19. Summary  COVID-19 is a respiratory infection that is caused by a virus. It is also known as coronavirus disease or novel coronavirus. It can cause serious infections, such as pneumonia, acute respiratory distress syndrome, acute respiratory failure, or sepsis.  The virus that causes COVID-19 is  contagious. This means that it can spread from person to person through droplets from breathing, speaking, singing, coughing, or sneezing.  You are more likely to develop a serious illness if you are 74 years of age or older, have a weak immune system, live in a nursing home, or have chronic disease.  There is no medicine to treat COVID-19. Your health care provider will talk with you about ways to treat your symptoms.  Take steps to protect yourself and others from infection. Wash your hands often and disinfect objects and surfaces that are frequently touched every day. Stay away from people who are sick and wear a mask if you are sick. This information is not intended to replace advice given to you by your health care provider. Make sure you discuss any questions you have with your health care provider. Document Revised: 10/26/2018 Document Reviewed: 02/01/2018 Elsevier Patient Education  [place].

## 2019-02-05 ENCOUNTER — Ambulatory Visit: Payer: Medicare Other | Attending: Internal Medicine

## 2019-02-06 ENCOUNTER — Other Ambulatory Visit: Payer: Self-pay

## 2019-02-06 ENCOUNTER — Ambulatory Visit: Payer: Medicare Other | Attending: Internal Medicine

## 2019-02-06 DIAGNOSIS — Z20822 Contact with and (suspected) exposure to covid-19: Secondary | ICD-10-CM

## 2019-02-07 LAB — NOVEL CORONAVIRUS, NAA: SARS-CoV-2, NAA: DETECTED — AB

## 2019-02-08 ENCOUNTER — Other Ambulatory Visit: Payer: Self-pay | Admitting: Nurse Practitioner

## 2019-02-08 ENCOUNTER — Telehealth: Payer: Self-pay

## 2019-02-08 DIAGNOSIS — U071 COVID-19: Secondary | ICD-10-CM

## 2019-02-08 NOTE — Telephone Encounter (Signed)
Patient states that she tested POSITIVE for [place] yesterday. Patient states that she's having night sweats, headaches, itchy throat, and thick clear mucus, and cough. Patient is taking Mucinex/Cough Drops to help with this. Patient states that she has a blister on the left side of her jaw on her cheek. Patient states that she's used Orajel on blister to help with the pain. Patient states that this has helped. Patient states that she wants to know if Asprin is Ok to take and what Mg?

## 2019-02-08 NOTE — Telephone Encounter (Signed)
-   May take Tylenol 500 mg tablet one every 6 hrs as needed for headache or achness. - Gurgle throat with warm water and salt and spit at least once daily  - Increase your fluid intake /soup - continue on Orajel on the blister since it has helped. - Take Aspirin 325 mg tablet one by mouth daily x 14 days  Continue on Vit C,Vit D and Zinc supplement as directed on your recent visit.  - continue to self Quarantine,wear facial mask,social distancing, hand hygiene and frequent cleaning of surface per CDC guidelines.  Self Quarantine for 14 days until symptoms free for 24 hours.

## 2019-02-08 NOTE — Telephone Encounter (Signed)
Patient called and notified of all instructions. Patient also advised that doing infusion would help benefit her as directed by you.

## 2019-02-08 NOTE — Progress Notes (Signed)
  I connected by phone with Fredderick Erb on 02/08/2019 at 9:42 AM to discuss the potential use of an new treatment for mild to moderate COVID-19 viral infection in non-hospitalized patients.  This patient is a 74 y.o. female that meets the FDA criteria for Emergency Use Authorization of bamlanivimab or casirivimab\imdevimab.  Has a (+) direct SARS-CoV-2 viral test result  Has mild or moderate COVID-19   Is ? 74 years of age and weighs ? 40 kg  Is NOT hospitalized due to COVID-19  Is NOT requiring oxygen therapy or requiring an increase in baseline oxygen flow rate due to COVID-19  Is within 10 days of symptom onset  Has at least one of the high risk factor(s) for progression to severe COVID-19 and/or hospitalization as defined in EUA.  Specific high risk criteria : >/= 74 yo   I have spoken and communicated the following to the patient or parent/caregiver:  1. FDA has authorized the emergency use of bamlanivimab and casirivimab\imdevimab for the treatment of mild to moderate COVID-19 in adults and pediatric patients with positive results of direct SARS-CoV-2 viral testing who are 81 years of age and older weighing at least 40 kg, and who are at high risk for progressing to severe COVID-19 and/or hospitalization.  2. The significant known and potential risks and benefits of bamlanivimab and casirivimab\imdevimab, and the extent to which such potential risks and benefits are unknown.  3. Information on available alternative treatments and the risks and benefits of those alternatives, including clinical trials.  4. Patients treated with bamlanivimab and casirivimab\imdevimab should continue to self-isolate and use infection control measures (e.g., wear mask, isolate, social distance, avoid sharing personal items, clean and disinfect "high touch" surfaces, and frequent handwashing) according to CDC guidelines.   5. The patient or parent/caregiver has the option to accept or refuse  bamlanivimab or casirivimab\imdevimab .  After reviewing this information with the patient, The patient agreed to proceed with receiving the bamlanimivab infusion and will be provided a copy of the Fact sheet prior to receiving the infusion.Fenton Foy 02/08/2019 9:42 AM

## 2019-02-10 ENCOUNTER — Ambulatory Visit (HOSPITAL_COMMUNITY)
Admission: RE | Admit: 2019-02-10 | Discharge: 2019-02-10 | Disposition: A | Discharge status: Home / Self Care | Payer: Medicare Other | Source: Ambulatory Visit | Attending: Pulmonary Disease | Admitting: Pulmonary Disease

## 2019-02-10 DIAGNOSIS — Z23 Encounter for immunization: Secondary | ICD-10-CM | POA: Insufficient documentation

## 2019-02-10 DIAGNOSIS — U071 COVID-19: Secondary | ICD-10-CM | POA: Insufficient documentation

## 2019-02-10 MED ORDER — DIPHENHYDRAMINE HCL 50 MG/ML IJ SOLN: 50.0000 mg | Freq: Once | INTRAMUSCULAR | Status: DC | PRN

## 2019-02-10 MED ORDER — EPINEPHRINE 0.3 MG/0.3ML IJ SOAJ: 0.3000 mg | Freq: Once | INTRAMUSCULAR | Status: DC | PRN

## 2019-02-10 MED ORDER — SODIUM CHLORIDE 0.9 % IV SOLN
700.0000 mg | Freq: Once | INTRAVENOUS | Status: AC
  Administered 2019-02-10: 700 mg via INTRAVENOUS
  Filled 2019-02-10: qty 20

## 2019-02-10 MED ORDER — SODIUM CHLORIDE 0.9 % IV SOLN
INTRAVENOUS | Status: DC | PRN
  Administered 2019-02-10: 11:00:00 250 mL via INTRAVENOUS

## 2019-02-10 MED ORDER — ALBUTEROL SULFATE HFA 108 (90 BASE) MCG/ACT IN AERS: 2.0000 | INHALATION_SPRAY | Freq: Once | RESPIRATORY_TRACT | Status: DC | PRN

## 2019-02-10 MED ORDER — FAMOTIDINE IN NACL 20-0.9 MG/50ML-% IV SOLN: 20.0000 mg | Freq: Once | INTRAVENOUS | Status: DC | PRN

## 2019-02-10 MED ORDER — METHYLPREDNISOLONE SODIUM SUCC 125 MG IJ SOLR: 125.0000 mg | Freq: Once | INTRAMUSCULAR | Status: DC | PRN

## 2019-02-10 NOTE — Progress Notes (Signed)
  Diagnosis: COVID-19  Physician:dr wright  Procedure: [facility] Med: bamlanivimab infusion - Provided patient with bamlanimivab fact sheet for patients, parents and caregivers prior to infusion.  Complications: No immediate complications noted.  Discharge: Discharged home   [place] 02/10/2019

## 2019-02-10 NOTE — Discharge Instructions (Signed)

## 2019-02-12 ENCOUNTER — Other Ambulatory Visit: Payer: Self-pay | Admitting: Family

## 2019-02-12 DIAGNOSIS — I10 Essential (primary) hypertension: Secondary | ICD-10-CM

## 2019-03-12 ENCOUNTER — Other Ambulatory Visit: Payer: Self-pay

## 2019-03-12 DIAGNOSIS — M79604 Pain in right leg: Secondary | ICD-10-CM

## 2019-03-12 DIAGNOSIS — M545 Low back pain: Secondary | ICD-10-CM

## 2019-03-12 MED ORDER — HYDROCODONE-ACETAMINOPHEN 10-325 MG PO TABS: 1.0000 | ORAL_TABLET | Freq: Four times a day (QID) | ORAL | 0 refills | Status: DC | PRN

## 2019-03-12 NOTE — Telephone Encounter (Signed)
Spoke with patient, patient aware rx sent as requested

## 2019-03-12 NOTE — Telephone Encounter (Signed)
RX last filled in epic on 01/14/19   Patient does not have a opioid treatment agreement on file   Pending appt for 04/29/2019 states patient will update agreement at that time.

## 2019-03-19 ENCOUNTER — Encounter: Payer: Self-pay | Admitting: Family

## 2019-03-19 ENCOUNTER — Other Ambulatory Visit: Payer: Self-pay

## 2019-03-19 ENCOUNTER — Ambulatory Visit (INDEPENDENT_AMBULATORY_CARE_PROVIDER_SITE_OTHER): Payer: Medicare Other | Admitting: Family

## 2019-03-19 DIAGNOSIS — J310 Chronic rhinitis: Secondary | ICD-10-CM | POA: Diagnosis not present

## 2019-03-19 MED ORDER — FLUTICASONE PROPIONATE 50 MCG/ACT NA SUSP: 2.0000 | Freq: Every day | NASAL | 6 refills | Status: DC

## 2019-03-19 NOTE — Patient Instructions (Addendum)
-   Instil fluticasone (FLONASE) 50 MCG/ACT nasal spray; Place 2 sprays into both nostrils daily. - gurgle throat with warm water and salt at least three times daily to reduce mucus amounts Postnasal Drip Postnasal drip is the feeling of mucus going down the back of your throat. Mucus is a slimy substance that moistens and cleans your nose and throat, as well as the air pockets in face bones near your forehead and cheeks (sinuses). Small amounts of mucus pass from your nose and sinuses down the back of your throat all the time. This is normal. When you produce too much mucus or the mucus gets too thick, you can feel it. Some common causes of postnasal drip include:  Having more mucus because of: ? A cold or the flu. ? Allergies. ? Cold air. ? Certain medicines.  Having more mucus that is thicker because of: ? A sinus or nasal infection. ? Dry air. ? A food allergy. Follow these instructions at home: Relieving discomfort   Gargle with a salt-water mixture 3-4 times a day or as needed. To make a salt-water mixture, completely dissolve -1 tsp of salt in 1 cup of warm water.  If the air in your home is dry, use a humidifier to add moisture to the air.  Use a saline spray or container (neti pot) to flush out the nose (nasal irrigation). These methods can help clear away mucus and keep the nasal passages moist. General instructions  Take over-the-counter and prescription medicines only as told by your health care provider.  Follow instructions from your health care provider about eating or drinking restrictions. You may need to avoid caffeine.  Avoid things that you know you are allergic to (allergens), like dust, mold, pollen, pets, or certain foods.  Drink enough fluid to keep your urine pale yellow.  Keep all follow-up visits as told by your health care provider. This is important. Contact a health care provider if:  You have a fever.  You have a sore throat.  You have  difficulty swallowing.  You have headache.  You have sinus pain.  You have a cough that does not go away.  The mucus from your nose becomes thick and is green or yellow in color.  You have cold or flu symptoms that last more than 10 days. Summary  Postnasal drip is the feeling of mucus going down the back of your throat.  If your health care provider approves, use nasal irrigation or a nasal spray 2?4 times a day.  Avoid things that you know you are allergic to (allergens), like dust, mold, pollen, pets, or certain foods. This information is not intended to replace advice given to you by your health care provider. Make sure you discuss any questions you have with your health care provider. Document Revised: 04/20/2018 Document Reviewed: 04/11/2016 Elsevier Patient Education  [place].

## 2019-03-19 NOTE — Progress Notes (Signed)
Patient ID: Kaylee Warner, female   DOB: 04-13-1945, 74 y.o.   MRN: [id number] This service is provided via telemedicine  No vital signs collected/recorded due to the encounter was a telemedicine visit.   Location of patient (ex: home, work):  HOME  Patient consents to a telephone visit:  YES  Location of the provider (ex: office, home):  OFFICE  Name of any referring provider:  [facility] Jakorian Marengo, NP  Names of all persons participating in the telemedicine service and their role in the encounter:  PATIENT, [name], [place], [facility] Aryaman Haliburton, NP  Time spent on call: 5 minutes 22 seconds.   Provider: Marlowe Sax FNP-C  Lucca Ballo, Nelda Bucks, NP  Patient Care Team: Brunette Lavalle, Nelda Bucks, NP as PCP - General (Family Medicine)  Extended Emergency Contact Information Primary Emergency Contact: [place] Phone: [phone] Relation: Son Secondary Emergency Contact: Raven,James R Address: [address]          [place], [place] [address] [place] Phone: [phone] Relation: Spouse  Code Status:  Full Code  Goals of care: Advanced Directive information Advanced Directives 11/22/2018  Does Patient Have a Medical Advance Directive? No  Type of Advance Directive -  Copy of [place] in Chart? -  Would patient like information on creating a medical advance directive? Yes (MAU/Ambulatory/Procedural Areas - Information given)     Chief Complaint  Patient presents with  . Acute Visit    Cough and Drainage x1 week    HPI:  Pt is a 74 y.o. female seen today for an acute visit for evaluation of cough and drainage x 1 week. States has clear nasal drainage runny down at the back of the throat that causes her to cough.cough worst at night.she denies any nasal congestion but does chronic sinus pressure.No fever or chills.she takes Human resources officer as needed.she denies any issues with acid reflux. She states tested positive for  COVID-19  Since last advised to get tested on 02/04/2019 visit.she tested positive 02/05/2019 was treated outpatient with I.V.bamlanimivab infusion.she states feeling much better.No shortness of breath.Has full recovered.Husband was also positive for COVID-19 and was  treated with infusion.both are doing well.   Past Medical History:  Diagnosis Date  . Allergy   . Anemia   . Arthritis   . Hypertension    Past Surgical History:  Procedure Laterality Date  . COLONOSCOPY    . JOINT REPLACEMENT  2008  . SARK 2009     Dr. Jolee Ewing     Allergies  Allergen Reactions  . Celecoxib     REACTION: blood pressure went up  . Codeine Itching  . Fexofenadine Other (See Comments)    unknown  . Penicillins Rash    Outpatient Encounter Medications as of 03/19/2019  Medication Sig  . amLODipine (NORVASC) 10 MG tablet Take 1/2 (one-half) tablet by mouth once daily  . Cholecalciferol (VITAMIN D3) 125 MCG (5000 UT) CAPS Take 1 capsule (5,000 Units total) by mouth every morning.  . donepezil (ARICEPT) 10 MG tablet TAKE 1 TABLET BY MOUTH AT BEDTIME  . HYDROcodone-acetaminophen (NORCO) 10-325 MG tablet Take 1 tablet by mouth every 6 (six) hours as needed for moderate pain.  Marland Kitchen lisinopril (ZESTRIL) 40 MG tablet Take 1 tablet by mouth once daily  . Melatonin 3 MG TABS Take 1 tablet (3 mg total) by mouth at bedtime.  . meloxicam (MOBIC) 7.5 MG tablet Take 1 tablet by mouth once daily  .  PARoxetine (PAXIL) 20 MG tablet Take 1 tablet by mouth once daily  . vitamin C (VITAMIN C) 500 MG tablet Take 0.5 tablets (250 mg total) by mouth 2 (two) times daily.   No facility-administered encounter medications on file as of 03/19/2019.    Review of Systems  Constitutional: Negative for chills.  HENT: Positive for postnasal drip, rhinorrhea and sinus pressure. Negative for congestion, sinus pain, sneezing, sore throat and trouble swallowing.   Respiratory: Negative for cough, chest tightness, shortness of breath and  wheezing.   Cardiovascular: Negative for chest pain, palpitations and leg swelling.  Gastrointestinal: Negative for abdominal distention, constipation, diarrhea, nausea and vomiting.  Musculoskeletal: Positive for arthralgias. Negative for gait problem.  Skin: Negative for color change, pallor and rash.  Neurological: Negative for dizziness, speech difficulty and light-headedness.  Psychiatric/Behavioral: Negative for agitation and confusion.    Immunization History  Administered Date(s) Administered  . Fluad Quad(high Dose 65+) 10/29/2018  . Influenza, High Dose Seasonal PF 11/24/2017  . Influenza,inj,Quad PF,6+ Mos 09/24/2015, 12/15/2016  . Pneumococcal Conjugate-13 12/16/2015  . Pneumococcal Polysaccharide-23 06/27/2018  . Td 01/17/2017   Pertinent  Health Maintenance Due  Topic Date Due  . MAMMOGRAM  09/06/2018  . COLONOSCOPY  10/17/2021  . INFLUENZA VACCINE  Completed  . DEXA SCAN  Completed  . PNA vac Low Risk Adult  Completed   Fall Risk  03/19/2019 11/22/2018 10/29/2018 06/27/2018 01/22/2018  Falls in the past year? 0 0 0 0 0  Number falls in past yr: 0 0 0 0 0  Comment - - - - -  Injury with Fall? 0 - 0 0 0    There were no vitals filed for this visit. There is no height or weight on file to calculate BMI. Physical Exam  Unable to complete on Telephone visit.   Labs reviewed: Recent Labs    06/27/18 1432 10/24/18 0852  NA 140 142  K 4.3 4.0  CL 106 108  CO2 23 26  GLUCOSE 85 96  BUN 11 11  CREATININE 0.63 0.60  CALCIUM 9.8 9.2   Recent Labs    06/27/18 1432 10/24/18 0852  AST 17 13  ALT 12 9  BILITOT 0.6 0.7  PROT 7.3 6.3   Recent Labs    06/27/18 1432 10/24/18 0852  WBC 6.4 5.3  NEUTROABS 3,456 3,101  HGB 13.0 12.1  HCT 42.5 38.5  MCV 76.9* 76.7*  PLT 367 317   Lab Results  Component Value Date   TSH 0.87 01/17/2017   No results found for: HGBA1C Lab Results  Component Value Date   CHOL 212 (H) 10/24/2018   HDL 50 10/24/2018    LDLCALC 141 (H) 10/24/2018   TRIG 99 10/24/2018   CHOLHDL 4.2 10/24/2018    Significant Diagnostic Results in last 30 days:  No results found.  Assessment/Plan   Noninfectious nonallergic rhinitis - Afebrile. - Has had postnasal drip  - Encourage to gurgle throat with warm water and salt at least three times daily to reduce mucus amounts - encourage to take her allegra daily - Will start on Flonase 50 mcg nasal spray advise to instill 2 sprays into each nares. Verbalized understanding.  - fluticasone (FLONASE) 50 MCG/ACT nasal spray; Place 2 sprays into both nostrils daily.  Dispense: 16 g; Refill: 6 - Notify provider if symptoms worsen or fail to resolve.  - additional education material added to AVS   Family/ staff Communication: Reviewed plan of care with patient verbalized understating.  Labs/tests ordered: None   Next Appointment: Has upcoming appointment on 4/10 /2021   Spent 11 minutes of non-face to face with patient    Sandrea Hughs, NP

## 2019-04-08 ENCOUNTER — Other Ambulatory Visit: Payer: Self-pay | Admitting: Family

## 2019-04-08 DIAGNOSIS — F4329 Adjustment disorder with other symptoms: Secondary | ICD-10-CM

## 2019-04-22 ENCOUNTER — Other Ambulatory Visit: Payer: Medicare Other

## 2019-04-22 ENCOUNTER — Other Ambulatory Visit: Payer: Medicare Other | Admitting: Family

## 2019-04-29 ENCOUNTER — Ambulatory Visit (INDEPENDENT_AMBULATORY_CARE_PROVIDER_SITE_OTHER): Payer: Medicare Other | Admitting: Family

## 2019-04-29 ENCOUNTER — Encounter: Payer: Self-pay | Admitting: Family

## 2019-04-29 ENCOUNTER — Other Ambulatory Visit: Payer: Self-pay

## 2019-04-29 VITALS — BP 130/80 | HR 78 | Temp 97.7°F | Resp 18 | Ht 69.0 in | Wt 180.0 lb

## 2019-04-29 DIAGNOSIS — M25511 Pain in right shoulder: Secondary | ICD-10-CM

## 2019-04-29 DIAGNOSIS — I83892 Varicose veins of left lower extremities with other complications: Secondary | ICD-10-CM

## 2019-04-29 DIAGNOSIS — F321 Major depressive disorder, single episode, moderate: Secondary | ICD-10-CM | POA: Diagnosis not present

## 2019-04-29 DIAGNOSIS — E782 Mixed hyperlipidemia: Secondary | ICD-10-CM | POA: Diagnosis not present

## 2019-04-29 DIAGNOSIS — I10 Essential (primary) hypertension: Secondary | ICD-10-CM

## 2019-04-29 DIAGNOSIS — J310 Chronic rhinitis: Secondary | ICD-10-CM

## 2019-04-29 DIAGNOSIS — R413 Other amnesia: Secondary | ICD-10-CM

## 2019-04-29 DIAGNOSIS — M79604 Pain in right leg: Secondary | ICD-10-CM

## 2019-04-29 DIAGNOSIS — L918 Other hypertrophic disorders of the skin: Secondary | ICD-10-CM

## 2019-04-29 DIAGNOSIS — M545 Low back pain: Secondary | ICD-10-CM

## 2019-04-29 MED ORDER — DONEPEZIL HCL 10 MG PO TABS: 10.0000 mg | ORAL_TABLET | Freq: Every day | ORAL | 1 refills | Status: DC

## 2019-04-29 MED ORDER — HYDROCODONE-ACETAMINOPHEN 10-325 MG PO TABS: 1.0000 | ORAL_TABLET | Freq: Four times a day (QID) | ORAL | 0 refills | Status: DC | PRN

## 2019-04-29 NOTE — Patient Instructions (Signed)
Continue on current medication   DASH Eating Plan DASH stands for "Dietary Approaches to Stop Hypertension." The DASH eating plan is a healthy eating plan that has been shown to reduce high blood pressure (hypertension). It may also reduce your risk for type 2 diabetes, heart disease, and stroke. The DASH eating plan may also help with weight loss. What are tips for following this plan?  General guidelines  Avoid eating more than 2,300 mg (milligrams) of salt (sodium) a day. If you have hypertension, you may need to reduce your sodium intake to 1,500 mg a day.  Limit alcohol intake to no more than 1 drink a day for nonpregnant women and 2 drinks a day for men. One drink equals 12 oz of beer, 5 oz of wine, or 1 oz of hard liquor.  Work with your health care provider to maintain a healthy body weight or to lose weight. Ask what an ideal weight is for you.  Get at least 30 minutes of exercise that causes your heart to beat faster (aerobic exercise) most days of the week. Activities may include walking, swimming, or biking.  Work with your health care provider or diet and nutrition specialist (dietitian) to adjust your eating plan to your individual calorie needs. Reading food labels   Check food labels for the amount of sodium per serving. Choose foods with less than 5 percent of the Daily Value of sodium. Generally, foods with less than 300 mg of sodium per serving fit into this eating plan.  To find whole grains, look for the word "whole" as the first word in the ingredient list. Shopping  Buy products labeled as "low-sodium" or "no salt added."  Buy fresh foods. Avoid canned foods and premade or frozen meals. Cooking  Avoid adding salt when cooking. Use salt-free seasonings or herbs instead of table salt or sea salt. Check with your health care provider or pharmacist before using salt substitutes.  Do not fry foods. Cook foods using healthy methods such as baking, boiling, grilling,  and broiling instead.  Cook with heart-healthy oils, such as olive, canola, soybean, or sunflower oil. Meal planning  Eat a balanced diet that includes: ? 5 or more servings of fruits and vegetables each day. At each meal, try to fill half of your plate with fruits and vegetables. ? Up to 6-8 servings of whole grains each day. ? Less than 6 oz of lean meat, poultry, or fish each day. A 3-oz serving of meat is about the same size as a deck of cards. One egg equals 1 oz. ? 2 servings of low-fat dairy each day. ? A serving of nuts, seeds, or beans 5 times each week. ? Heart-healthy fats. Healthy fats called Omega-3 fatty acids are found in foods such as flaxseeds and coldwater fish, like sardines, salmon, and mackerel.  Limit how much you eat of the following: ? Canned or prepackaged foods. ? Food that is high in trans fat, such as fried foods. ? Food that is high in saturated fat, such as fatty meat. ? Sweets, desserts, sugary drinks, and other foods with added sugar. ? Full-fat dairy products.  Do not salt foods before eating.  Try to eat at least 2 vegetarian meals each week.  Eat more home-cooked food and less restaurant, buffet, and fast food.  When eating at a restaurant, ask that your food be prepared with less salt or no salt, if possible. What foods are recommended? The items listed may not be a complete   Talk with your dietitian about what dietary choices are best for you. Grains Whole-grain or whole-wheat bread. Whole-grain or whole-wheat pasta. Brown rice. Kaylee Warner. Bulgur. Whole-grain and low-sodium cereals. Pita bread. Low-fat, low-sodium crackers. Whole-wheat flour tortillas. Vegetables Fresh or frozen vegetables (raw, steamed, roasted, or grilled). Low-sodium or reduced-sodium tomato and vegetable juice. Low-sodium or reduced-sodium tomato sauce and tomato paste. Low-sodium or reduced-sodium canned vegetables. Fruits All fresh, dried, or frozen fruit. Canned  fruit in natural juice (without added sugar). Meat and other protein foods Skinless chicken or [place]. Ground chicken or [place]. Pork with fat trimmed off. Fish and seafood. Egg whites. Dried beans, peas, or lentils. Unsalted nuts, nut butters, and seeds. Unsalted canned beans. Lean cuts of beef with fat trimmed off. Low-sodium, lean deli meat. Dairy Low-fat (1%) or fat-free (skim) milk. Fat-free, low-fat, or reduced-fat cheeses. Nonfat, low-sodium ricotta or cottage cheese. Low-fat or nonfat yogurt. Low-fat, low-sodium cheese. Fats and oils Soft margarine without trans fats. Vegetable oil. Low-fat, reduced-fat, or light mayonnaise and salad dressings (reduced-sodium). Canola, safflower, olive, soybean, and sunflower oils. Avocado. Seasoning and other foods Herbs. Spices. Seasoning mixes without salt. Unsalted popcorn and pretzels. Fat-free sweets. What foods are not recommended? The items listed may not be a complete list. Talk with your dietitian about what dietary choices are best for you. Grains Baked goods made with fat, such as croissants, muffins, or some breads. Dry pasta or rice meal packs. Vegetables Creamed or fried vegetables. Vegetables in a cheese sauce. Regular canned vegetables (not low-sodium or reduced-sodium). Regular canned tomato sauce and paste (not low-sodium or reduced-sodium). Regular tomato and vegetable juice (not low-sodium or reduced-sodium). Kaylee Warner. Olives. Fruits Canned fruit in a light or heavy syrup. Fried fruit. Fruit in cream or butter sauce. Meat and other protein foods Fatty cuts of meat. Ribs. Fried meat. Kaylee Warner. Sausage. Bologna and other processed lunch meats. Salami. Fatback. Hotdogs. Bratwurst. Salted nuts and seeds. Canned beans with added salt. Canned or smoked fish. Whole eggs or egg yolks. Chicken or [place] with skin. Dairy Whole or 2% milk, cream, and half-and-half. Whole or full-fat cream cheese. Whole-fat or sweetened yogurt. Full-fat cheese.  Nondairy creamers. Whipped toppings. Processed cheese and cheese spreads. Fats and oils Butter. Stick margarine. Lard. Shortening. Ghee. Bacon fat. Tropical oils, such as coconut, palm kernel, or palm oil. Seasoning and other foods Salted popcorn and pretzels. Onion salt, garlic salt, seasoned salt, table salt, and sea salt. Worcestershire sauce. Tartar sauce. Barbecue sauce. Teriyaki sauce. Soy sauce, including reduced-sodium. Steak sauce. Canned and packaged gravies. Fish sauce. Oyster sauce. Cocktail sauce. Horseradish that you find on the shelf. Ketchup. Mustard. Meat flavorings and tenderizers. Bouillon cubes. Hot sauce and Tabasco sauce. Premade or packaged marinades. Premade or packaged taco seasonings. Relishes. Regular salad dressings. Where to find more information:  National Heart, Lung, and [place]: https://wilson-eaton.com/  American Heart Association: www.heart.org Summary  The DASH eating plan is a healthy eating plan that has been shown to reduce high blood pressure (hypertension). It may also reduce your risk for type 2 diabetes, heart disease, and stroke.  With the DASH eating plan, you should limit salt (sodium) intake to 2,300 mg a day. If you have hypertension, you may need to reduce your sodium intake to 1,500 mg a day.  When on the DASH eating plan, aim to eat more fresh fruits and vegetables, whole grains, lean proteins, low-fat dairy, and heart-healthy fats.  Work with your health care provider or diet and nutrition specialist (dietitian) to adjust  your eating plan to your individual calorie needs. This information is not intended to replace advice given to you by your health care provider. Make sure you discuss any questions you have with your health care provider. Document Revised: 12/09/2016 Document Reviewed: 12/21/2015 Elsevier Patient Education  2020 [employer].

## 2019-04-29 NOTE — Progress Notes (Signed)
Provider: Marlowe Sax FNP-C   Darsi Tien, Nelda Bucks, NP  Patient Care Team: Amarah Brossman, Nelda Bucks, NP as PCP - General (Family Medicine)  Extended Emergency Contact Information Primary Emergency Contact: [place] of [place] Mobile Phone: [phone] Relation: Son Secondary Emergency Contact: Pinette,James R Address: [address]          [place], [place] [address] [place] Phone: [phone] Relation: Spouse  Code Status: Full Code  Goals of care: Advanced Directive information Advanced Directives 11/22/2018  Does Patient Have a Medical Advance Directive? No  Type of Advance Directive -  Copy of [place] in Chart? -  Would patient like information on creating a medical advance directive? Yes (MAU/Ambulatory/Procedural Areas - Information given)     Chief Complaint  Patient presents with   Medical Management of Chronic Issues    51mth follow-up    HPI:  Pt is a 74 y.o. female seen today for 6 months follow up for medical management of chronic diseases.she complains of left lateral leg itching for 4 months.denies any insect bite,rash or redness.Has had no change in her soap,lotion or detergent.she has not used any medication on itchy area.   Hypertension - no home blood pressure for review.she denies any signs of hypotension.taking amlodipine 10 mg tablet daily and lisinopril 40 mg tablet daily.States has cut down on salt intake and drinking more water. States started her water aerobics classes three times per week for 45 minutes.classes were interrupted last week but will be resuming.   Hyperlipidemia - previous LDL 141,Chol 212 but TRG and HDL are normal.she includes vegetables in her diet and has increased her exercises doing water aerobics x 45 minutes three times per week.Has schedule labs but not fasting today.will return fasting for lab work.     Depression -states symptoms controlled on Paxil 20 mg tablet  daily.Pharmacy review recommend switching to newer SSRI due to Paxil side effects among elder patient.states her depression symptoms are control on Paxil would like to continue for now.   Rhinitis - states Flonase 50 mcg/ACT nasal spray has been effective.   Memory loss - states no worsening of symptoms.on donepezil 10 mg tablet at bedtime.    Lower back pain - water aerobics helps with her pain.follows up with pain management for injection.Has upcoming appointment.on Hydrocodone-APAP not prescribed by pain management.Discussed narcotic uses and high risk for sedation.continues to require at four tables per day.will obtain urine for drug test this visit.   Right shoulder pain - NorcoTakes twice daily effective.Water aerobics helps with her pain.       Past Medical History:  Diagnosis Date   Allergy    Anemia    Arthritis    Hypertension    Past Surgical History:  Procedure Laterality Date   COLONOSCOPY     JOINT REPLACEMENT  2008   SARK 2009     Dr. Jolee Ewing     Allergies  Allergen Reactions   Celecoxib     REACTION: blood pressure went up   Codeine Itching   Fexofenadine Other (See Comments)    unknown   Penicillins Rash    Allergies as of 04/29/2019      Reactions   Celecoxib    REACTION: blood pressure went up   Codeine Itching   Fexofenadine Other (See Comments)   unknown   Penicillins Rash      Medication List       Accurate as of April 29, 2019  2:43 PM. If you have any questions, ask your nurse or doctor.        STOP taking these medications   ascorbic acid 500 MG tablet Commonly known as: VITAMIN C Stopped by: Nelda Bucks Cahterine Heinzel, NP     TAKE these medications   amLODipine 10 MG tablet Commonly known as: NORVASC Take 1/2 (one-half) tablet by mouth once daily   donepezil 10 MG tablet Commonly known as: ARICEPT TAKE 1 TABLET BY MOUTH AT BEDTIME   fluticasone 50 MCG/ACT nasal spray Commonly known as: FLONASE Place 2 sprays into both  nostrils daily.   HYDROcodone-acetaminophen 10-325 MG tablet Commonly known as: Norco Take 1 tablet by mouth every 6 (six) hours as needed for moderate pain.   lisinopril 40 MG tablet Commonly known as: ZESTRIL Take 1 tablet by mouth once daily   melatonin 3 MG Tabs tablet Take 1 tablet (3 mg total) by mouth at bedtime.   meloxicam 7.5 MG tablet Commonly known as: MOBIC Take 1 tablet by mouth once daily   PARoxetine 20 MG tablet Commonly known as: PAXIL Take 1 tablet by mouth once daily   Vitamin D3 125 MCG (5000 UT) Caps Take 1 capsule (5,000 Units total) by mouth every morning.       Review of Systems  Constitutional: Negative for activity change, appetite change, chills, fatigue and fever.  HENT: Positive for postnasal drip and rhinorrhea. Negative for congestion, sinus pressure, sinus pain, sneezing, sore throat and trouble swallowing.   Eyes: Positive for visual disturbance. Negative for pain, discharge, redness and itching.       Wears eye glasses has upcoming appointment with 05/08/2019 with Ophthalmology   Respiratory: Negative for cough, chest tightness, shortness of breath and wheezing.   Cardiovascular: Negative for chest pain, palpitations and leg swelling.  Gastrointestinal: Negative for abdominal distention, abdominal pain, blood in stool, constipation, diarrhea, nausea and vomiting.  Endocrine: Negative for cold intolerance, heat intolerance, polydipsia, polyphagia and polyuria.  Genitourinary: Negative for difficulty urinating, dysuria, flank pain, frequency, urgency, vaginal discharge and vaginal pain.  Musculoskeletal: Positive for arthralgias and gait problem. Negative for back pain and myalgias.       Back pain rates 8/10 scale Right shoulder pain 5-6 /10scale takes Hydrocodone 2 tablets at least twice daily for pain.     Skin: Negative for color change, pallor and rash.  Neurological: Negative for dizziness, speech difficulty, weakness, light-headedness,  numbness and headaches.  Hematological: Does not bruise/bleed easily.  Psychiatric/Behavioral: Negative for agitation, confusion and suicidal ideas. The patient is not nervous/anxious.     Immunization History  Administered Date(s) Administered   Fluad Quad(high Dose 65+) 10/29/2018   Influenza, High Dose Seasonal PF 11/24/2017   Influenza,inj,Quad PF,6+ Mos 09/24/2015, 12/15/2016   Moderna SARS-COVID-2 Vaccination 03/29/2019, 04/26/2019   Pneumococcal Conjugate-13 12/16/2015   Pneumococcal Polysaccharide-23 06/27/2018   Td 01/17/2017   Pertinent  Health Maintenance Due  Topic Date Due   MAMMOGRAM  09/06/2018   INFLUENZA VACCINE  08/11/2019   COLONOSCOPY  10/17/2021   DEXA SCAN  Completed   PNA vac Low Risk Adult  Completed   Fall Risk  04/29/2019 03/19/2019 11/22/2018 10/29/2018 06/27/2018  Falls in the past year? 0 0 0 0 0  Number falls in past yr: 0 0 0 0 0  Comment - - - - -  Injury with Fall? 0 0 - 0 0    Vitals:   04/29/19 1418  BP: 130/80  Pulse: 78  Temp: 97.7 F (36.5  C)  TempSrc: Oral  SpO2: 97%  Weight: 180 lb (81.6 kg)  Height: 5\' 9"  (1.753 m)   Body mass index is 26.58 kg/m. Physical Exam Vitals reviewed.  Constitutional:      General: She is not in acute distress.    Appearance: She is overweight. She is not ill-appearing.  HENT:     Head: Normocephalic.     Right Ear: Tympanic membrane, ear canal and external ear normal. There is no impacted cerumen.     Left Ear: Tympanic membrane, ear canal and external ear normal. There is no impacted cerumen.     Nose: Nose normal. No congestion or rhinorrhea.     Mouth/Throat:     Mouth: Mucous membranes are moist.     Pharynx: Oropharynx is clear. No oropharyngeal exudate or posterior oropharyngeal erythema.  Eyes:     General: No scleral icterus.       Right eye: No discharge.        Left eye: No discharge.     Extraocular Movements: Extraocular movements intact.     Conjunctiva/sclera:  Conjunctivae normal.     Pupils: Pupils are equal, round, and reactive to light.     Comments: corrective lens in place   Neck:     Vascular: No carotid bruit.  Cardiovascular:     Rate and Rhythm: Normal rate and regular rhythm.     Pulses: Normal pulses.     Heart sounds: Normal heart sounds. No murmur. No friction rub. No gallop.      Comments: Left lateral calf muscle bluish colored varicose veins non tender to touch and no edema.small spider veins noted on right lower leg.   Pulmonary:     Effort: Pulmonary effort is normal. No respiratory distress.     Breath sounds: Normal breath sounds. No wheezing, rhonchi or rales.  Chest:     Chest wall: No tenderness.  Abdominal:     General: Bowel sounds are normal. There is no distension.     Palpations: There is no mass.     Tenderness: There is no abdominal tenderness. There is no right CVA tenderness, left CVA tenderness, guarding or rebound.  Musculoskeletal:        General: No swelling or tenderness. Normal range of motion.     Cervical back: Normal range of motion. No rigidity or tenderness.     Right lower leg: No edema.     Left lower leg: No edema.  Lymphadenopathy:     Cervical: No cervical adenopathy.  Skin:    General: Skin is warm.     Coloration: Skin is not pale.     Findings: No bruising, erythema or rash.     Comments: Multiple small dark fleshy lesion on anterior neck and upper chest   Neurological:     Mental Status: She is alert and oriented to person, place, and time.     Cranial Nerves: No cranial nerve deficit.     Sensory: No sensory deficit.     Motor: No weakness.     Coordination: Coordination normal.     Gait: Gait normal.  Psychiatric:        Mood and Affect: Mood normal.        Behavior: Behavior normal.        Thought Content: Thought content normal.        Judgment: Judgment normal.     Labs reviewed: Recent Labs    06/27/18 1432 10/24/18 0852  NA 140 142  K 4.3 4.0  CL 106 108  CO2  23 26  GLUCOSE 85 96  BUN 11 11  CREATININE 0.63 0.60  CALCIUM 9.8 9.2   Recent Labs    06/27/18 1432 10/24/18 0852  AST 17 13  ALT 12 9  BILITOT 0.6 0.7  PROT 7.3 6.3   Recent Labs    06/27/18 1432 10/24/18 0852  WBC 6.4 5.3  NEUTROABS 3,456 3,101  HGB 13.0 12.1  HCT 42.5 38.5  MCV 76.9* 76.7*  PLT 367 317   Lab Results  Component Value Date   TSH 0.87 01/17/2017   No results found for: HGBA1C Lab Results  Component Value Date   CHOL 212 (H) 10/24/2018   HDL 50 10/24/2018   LDLCALC 141 (H) 10/24/2018   TRIG 99 10/24/2018   CHOLHDL 4.2 10/24/2018    Significant Diagnostic Results in last 30 days:  No results found.  Assessment/Plan 1. Essential hypertension B/p controlled.continue on Amlodipine 10 mg tablet and lisinopril 40 mg tablet daily.  2. Mixed hyperlipidemia LDL not at goal. - continue on dietary modification and exercise. -has scheduled fasting labs.will add statin if levels still high. Additional education information on DASH diet provided on AVS  3. Current moderate episode of major depressive disorder, unspecified whether recurrent (HCC) Mood stable.continue on Paxil 20 mg tablet daily.  4. Noninfectious nonallergic rhinitis Continue Flonase 50 mcg/ACT nasal spray 2 sprays daily.  5. Skin Tag  Multiple small dark fleshy lesion on anterior neck and upper chest.Declined dermatology referral.  6.Varicose vein  Left lateral calf muscle bluish colored varicose veins non tender to touch and no edema.small spider veins noted on right lower leg.declined vein and vascular referral.   Family/ staff Communication: Reviewed plan of care with patient verbalized understanding.   Labs/tests ordered: Has scheduled lab work advised to get fasting lab done.  Next Appointment : 6 months for medical management of chronic issues.Fasting labs 1-2 weeks or sooner   Sandrea Hughs, NP

## 2019-04-30 DIAGNOSIS — L918 Other hypertrophic disorders of the skin: Secondary | ICD-10-CM | POA: Insufficient documentation

## 2019-04-30 DIAGNOSIS — I83892 Varicose veins of left lower extremities with other complications: Secondary | ICD-10-CM | POA: Insufficient documentation

## 2019-05-01 LAB — PAIN MGMT, PROFILE 1 CONF W/O MM, U
Amphetamines: NEGATIVE ng/mL
Barbiturates: NEGATIVE ng/mL
Benzodiazepines: NEGATIVE ng/mL
Cocaine Metabolite: NEGATIVE ng/mL
Creatinine: 212.9 mg/dL
Marijuana Metabolite: 57 ng/mL
Marijuana Metabolite: POSITIVE ng/mL
Methadone Metabolite: NEGATIVE ng/mL
Opiates: NEGATIVE ng/mL
Oxidant: NEGATIVE ug/mL
Oxycodone: NEGATIVE ng/mL
Phencyclidine: NEGATIVE ng/mL
pH: 5.4 (ref 4.5–9.0)

## 2019-05-02 ENCOUNTER — Other Ambulatory Visit: Payer: Self-pay | Admitting: *Deleted

## 2019-05-06 ENCOUNTER — Telehealth: Payer: Self-pay

## 2019-05-06 NOTE — Telephone Encounter (Signed)
Patient called to verify when her Lab appointment was

## 2019-05-08 ENCOUNTER — Other Ambulatory Visit: Payer: Medicare Other

## 2019-05-08 ENCOUNTER — Other Ambulatory Visit: Payer: Self-pay

## 2019-05-08 ENCOUNTER — Other Ambulatory Visit: Payer: Self-pay | Admitting: Family

## 2019-05-09 ENCOUNTER — Telehealth: Payer: Self-pay

## 2019-05-09 LAB — CBC WITH DIFFERENTIAL/PLATELET
Absolute Monocytes: 370 cells/uL (ref 200–950)
Basophils Absolute: 50 cells/uL (ref 0–200)
Basophils Relative: 1 %
Eosinophils Absolute: 200 cells/uL (ref 15–500)
Eosinophils Relative: 4 %
HCT: 39.3 % (ref 35.0–45.0)
Hemoglobin: 12.3 g/dL (ref 11.7–15.5)
Lymphs Abs: 1675 cells/uL (ref 850–3900)
MCH: 24.2 pg — ABNORMAL LOW (ref 27.0–33.0)
MCHC: 31.3 g/dL — ABNORMAL LOW (ref 32.0–36.0)
MCV: 77.2 fL — ABNORMAL LOW (ref 80.0–100.0)
MPV: 10.8 fL (ref 7.5–12.5)
Monocytes Relative: 7.4 %
Neutro Abs: 2705 cells/uL (ref 1500–7800)
Neutrophils Relative %: 54.1 %
Platelets: 353 10*3/uL (ref 140–400)
RBC: 5.09 10*6/uL (ref 3.80–5.10)
RDW: 15.9 % — ABNORMAL HIGH (ref 11.0–15.0)
Total Lymphocyte: 33.5 %
WBC: 5 10*3/uL (ref 3.8–10.8)

## 2019-05-09 LAB — LIPID PANEL
Cholesterol: 231 mg/dL — ABNORMAL HIGH (ref <200)
HDL: 53 mg/dL (ref >=50)
LDL Cholesterol (Calc): 154 mg/dL (calc) — ABNORMAL HIGH
Non-HDL Cholesterol (Calc): 178 mg/dL (calc) — ABNORMAL HIGH (ref <130)
Total CHOL/HDL Ratio: 4.4 (calc) (ref <5.0)
Triglycerides: 119 mg/dL (ref <150)

## 2019-05-09 LAB — COMPLETE METABOLIC PANEL WITH GFR
AG Ratio: 1.6 (calc) (ref 1.0–2.5)
ALT: 10 U/L (ref 6–29)
AST: 13 U/L (ref 10–35)
Albumin: 4.1 g/dL (ref 3.6–5.1)
Alkaline phosphatase (APISO): 78 U/L (ref 37–153)
BUN/Creatinine Ratio: 25 (calc) — ABNORMAL HIGH (ref 6–22)
BUN: 14 mg/dL (ref 7–25)
CO2: 25 mmol/L (ref 20–32)
Calcium: 9.2 mg/dL (ref 8.6–10.4)
Chloride: 109 mmol/L (ref 98–110)
Creat: 0.56 mg/dL — ABNORMAL LOW (ref 0.60–0.93)
GFR, Est African American: 106 mL/min/{1.73_m2} (ref >=60)
GFR, Est Non African American: 92 mL/min/{1.73_m2} (ref >=60)
Globulin: 2.6 g/dL (calc) (ref 1.9–3.7)
Glucose, Bld: 106 mg/dL — ABNORMAL HIGH (ref 65–99)
Potassium: 4.3 mmol/L (ref 3.5–5.3)
Sodium: 142 mmol/L (ref 135–146)
Total Bilirubin: 0.6 mg/dL (ref 0.2–1.2)
Total Protein: 6.7 g/dL (ref 6.1–8.1)

## 2019-05-09 LAB — TSH: TSH: 1.88 mIU/L (ref 0.40–4.50)

## 2019-05-09 LAB — HEPATITIS C ANTIBODY
Hepatitis C Ab: NONREACTIVE
SIGNAL TO CUT-OFF: 0.01 (ref <1.00)

## 2019-05-09 NOTE — Telephone Encounter (Signed)
Patient to schedule visit to discuss cholesterol and her headache. Pt. Given appt. For May 6,2021 at [facility]

## 2019-05-16 ENCOUNTER — Other Ambulatory Visit: Payer: Self-pay

## 2019-05-16 ENCOUNTER — Ambulatory Visit (INDEPENDENT_AMBULATORY_CARE_PROVIDER_SITE_OTHER): Payer: Medicare Other | Admitting: Family

## 2019-05-16 ENCOUNTER — Encounter: Payer: Self-pay | Admitting: Family

## 2019-05-16 VITALS — BP 130/84 | HR 76 | Temp 97.1°F | Resp 16 | Ht 69.0 in | Wt 181.6 lb

## 2019-05-16 DIAGNOSIS — E782 Mixed hyperlipidemia: Secondary | ICD-10-CM

## 2019-05-16 DIAGNOSIS — M545 Low back pain, unspecified: Secondary | ICD-10-CM

## 2019-05-16 DIAGNOSIS — I10 Essential (primary) hypertension: Secondary | ICD-10-CM | POA: Diagnosis not present

## 2019-05-16 DIAGNOSIS — M79604 Pain in right leg: Secondary | ICD-10-CM

## 2019-05-16 MED ORDER — ROSUVASTATIN CALCIUM 5 MG PO TABS: 5.0000 mg | ORAL_TABLET | Freq: Every day | ORAL | 3 refills | Status: DC

## 2019-05-16 MED ORDER — ASPIRIN EC 81 MG PO TBEC: 81.0000 mg | DELAYED_RELEASE_TABLET | Freq: Every day | ORAL | 3 refills | Status: DC

## 2019-05-16 NOTE — Progress Notes (Signed)
Provider: Marlowe Sax FNP-C  Jilene Spohr, Nelda Bucks, NP  Patient Care Team: Ailie Gage, Nelda Bucks, NP as PCP - General (Family Medicine)  Extended Emergency Contact Information Primary Emergency Contact: [place] of [place] Mobile Phone: [phone] Relation: Son Secondary Emergency Contact: [place],James R Address: [place], [place] [address] [place] Phone: [phone] Relation: Spouse  Code Status:Full Code  Goals of care: Advanced Directive information Advanced Directives 05/16/2019  Does Patient Have a Medical Advance Directive? No  Type of Advance Directive -  Copy of [place] in Chart? -  Would patient like information on creating a medical advance directive? -     Chief Complaint  Patient presents with  . Medication Management    Discuss Cholesterol Medications     HPI:  Pt is a 74 y.o. female seen today for an acute visit to discuss lab results.Recent Total cholesterol and LDLworst than previous level chol 231,LDL 154 she states has restarted her swimming exercise.states her diet includes high saturated fats eats eggs and cheese daily.she denies any chest tightness / pain,or shortness of breath.she was on Aspirin tablet when she had COVID-19 infection but had stopped taking.states will restart ASA.she is allergic to Celecoxib but has had no problems with baby Aspirin.  Her recent urine drug test was positive for marijuana and negative for hydrocodone that patient was being prescribed.patient states had relatives who came from [place] and smoked together during the weekend prior to her drug test " I let them put me to sin".I've discussed with her the high risk overdose using marijuana and pain medication.Hydrocodone will no longer be prescribed by provider she verbalized understanding.she will continue to follow up with every 3 months cortisol injection with orthopedic.Also discussed with her to apply  over the counter Salon pas for pain.    Past Medical History:  Diagnosis Date  . Allergy   . Anemia   . Arthritis   . Hypertension    Past Surgical History:  Procedure Laterality Date  . COLONOSCOPY    . JOINT REPLACEMENT  2008  . SARK 2009     Dr. Jolee Ewing     Allergies  Allergen Reactions  . Celecoxib     REACTION: blood pressure went up  . Codeine Itching  . Fexofenadine Other (See Comments)    unknown  . Penicillins Rash    Outpatient Encounter Medications as of 05/16/2019  Medication Sig  . amLODipine (NORVASC) 10 MG tablet Take 1/2 (one-half) tablet by mouth once daily  . Cholecalciferol (VITAMIN D3) 125 MCG (5000 UT) CAPS Take 1 capsule (5,000 Units total) by mouth every morning.  . donepezil (ARICEPT) 10 MG tablet Take 1 tablet (10 mg total) by mouth at bedtime.  . fluticasone (FLONASE) 50 MCG/ACT nasal spray Place 2 sprays into both nostrils daily.  Marland Kitchen lisinopril (ZESTRIL) 40 MG tablet Take 1 tablet by mouth once daily  . Melatonin 3 MG TABS Take 1 tablet (3 mg total) by mouth at bedtime.  . meloxicam (MOBIC) 7.5 MG tablet Take 1 tablet by mouth once daily  . PARoxetine (PAXIL) 20 MG tablet Take 1 tablet by mouth once daily   No facility-administered encounter medications on file as of 05/16/2019.    Review of Systems  Constitutional: Negative for appetite change, chills, fatigue and fever.  Respiratory: Negative for cough, chest tightness, shortness of breath and wheezing.   Cardiovascular: Negative for chest  pain, palpitations and leg swelling.  Gastrointestinal: Negative for abdominal distention, abdominal pain, constipation, diarrhea, nausea and vomiting.  Musculoskeletal: Positive for arthralgias and back pain. Negative for gait problem.  Skin: Negative for color change, pallor and rash.  Neurological: Negative for dizziness, speech difficulty, weakness, light-headedness, numbness and headaches.  Psychiatric/Behavioral: Negative for agitation, behavioral  problems, confusion and sleep disturbance. The patient is not nervous/anxious.     Immunization History  Administered Date(s) Administered  . Fluad Quad(high Dose 65+) 10/29/2018  . Influenza, High Dose Seasonal PF 11/24/2017  . Influenza,inj,Quad PF,6+ Mos 09/24/2015, 12/15/2016  . Moderna SARS-COVID-2 Vaccination 03/29/2019, 04/26/2019  . Pneumococcal Conjugate-13 12/16/2015  . Pneumococcal Polysaccharide-23 06/27/2018  . Td 01/17/2017  . Tdap 11/23/2010   Pertinent  Health Maintenance Due  Topic Date Due  . MAMMOGRAM  09/06/2018  . INFLUENZA VACCINE  08/11/2019  . COLONOSCOPY  10/17/2021  . DEXA SCAN  Completed  . PNA vac Low Risk Adult  Completed   Fall Risk  05/16/2019 04/29/2019 03/19/2019 11/22/2018 10/29/2018  Falls in the past year? 0 0 0 0 0  Number falls in past yr: 0 0 0 0 0  Comment - - - - -  Injury with Fall? 0 0 0 - 0    Vitals:   05/16/19 0850  BP: 130/84  Pulse: 76  Resp: 16  Temp: (!) 97.1 F (36.2 C)  SpO2: 98%  Weight: 181 lb 9.6 oz (82.4 kg)  Height: 5\' 9"  (1.753 m)   Body mass index is 26.82 kg/m. Physical Exam Vitals reviewed.  Constitutional:      General: She is not in acute distress.    Appearance: She is overweight. She is not ill-appearing.  HENT:     Head: Normocephalic.     Mouth/Throat:     Mouth: Mucous membranes are moist.     Pharynx: No oropharyngeal exudate or posterior oropharyngeal erythema.  Eyes:     General: No scleral icterus.       Right eye: No discharge.        Left eye: No discharge.     Conjunctiva/sclera: Conjunctivae normal.     Pupils: Pupils are equal, round, and reactive to light.  Cardiovascular:     Rate and Rhythm: Normal rate and regular rhythm.     Pulses: Normal pulses.     Heart sounds: Normal heart sounds. No murmur. No friction rub. No gallop.   Pulmonary:     Effort: Pulmonary effort is normal. No respiratory distress.     Breath sounds: Normal breath sounds. No wheezing, rhonchi or rales.    Chest:     Chest wall: No tenderness.  Abdominal:     General: Bowel sounds are normal. There is no distension.     Palpations: Abdomen is soft. There is no mass.     Tenderness: There is no abdominal tenderness. There is no right CVA tenderness, left CVA tenderness, guarding or rebound.  Musculoskeletal:        General: No swelling or tenderness. Normal range of motion.     Right lower leg: No edema.     Left lower leg: No edema.  Skin:    General: Skin is warm.     Coloration: Skin is not pale.     Findings: No bruising, erythema or rash.  Neurological:     Mental Status: She is alert and oriented to person, place, and time.     Cranial Nerves: No cranial nerve deficit.  Motor: No weakness.     Gait: Gait normal.  Psychiatric:        Mood and Affect: Mood normal.        Behavior: Behavior normal.        Thought Content: Thought content normal.        Judgment: Judgment normal.     Labs reviewed: Recent Labs    06/27/18 1432 10/24/18 0852 05/08/19 0821  NA 140 142 142  K 4.3 4.0 4.3  CL 106 108 109  CO2 23 26 25   GLUCOSE 85 96 106*  BUN 11 11 14   CREATININE 0.63 0.60 0.56*  CALCIUM 9.8 9.2 9.2   Recent Labs    06/27/18 1432 10/24/18 0852 05/08/19 0821  AST 17 13 13   ALT 12 9 10   BILITOT 0.6 0.7 0.6  PROT 7.3 6.3 6.7   Recent Labs    06/27/18 1432 10/24/18 0852 05/08/19 0821  WBC 6.4 5.3 5.0  NEUTROABS 3,456 3,101 2,705  HGB 13.0 12.1 12.3  HCT 42.5 38.5 39.3  MCV 76.9* 76.7* 77.2*  PLT 367 317 353   Lab Results  Component Value Date   TSH 1.88 05/08/2019   No results found for: HGBA1C Lab Results  Component Value Date   CHOL 231 (H) 05/08/2019   HDL 53 05/08/2019   LDLCALC 154 (H) 05/08/2019   TRIG 119 05/08/2019   CHOLHDL 4.4 05/08/2019    Significant Diagnostic Results in last 30 days:  No results found.  Assessment/Plan 1. Mixed hyperlipidemia LDL not at goal - Advised to take Crestor 5 mg tablet one by mouth daily.Side  effects discussed.Advised to call provider for any muscle aches or weakness.   - Advised to Increase physical activity to at least three time per week for 30 minutes. - Low carbohydrates,low saturated fats and high vegetable diet  - rosuvastatin (CRESTOR) 5 MG tablet; Take 1 tablet (5 mg total) by mouth daily.  Dispense: 90 tablet; Refill: 3 - will recheck Fasting lipid panel in 3 months  2. Essential hypertension B/p controlled. - Will add ASA EC 81 mg tablet daily for cardiovascular event prevention.Has allergy to Celecoxib but has had no problems with baby Aspirin. - Dietary and lifestyle modification discuss at length with patient.  - aspirin EC 81 MG tablet; Take 1 tablet (81 mg total) by mouth daily.  Dispense: 30 tablet; Refill: 3 - continue on Amlodipine 10 mg tablet daily and lisinopril 40 mg tablet daily   3. Lumbar pain with radiation down right leg - Hydrocodone discontinued due to positive marijuana on drug test and negative for prescribed hydrocodone.Risk for overdose discussed with patient verbalized understanding.   - continue on Mobic 7.5 mg tablet daily  - Advised to add over the counter Salon pas to lower back area  - continue to follow up with Orthopedic for cortisol injections every three months.   Family/ staff Communication: Reviewed plan of care with patient verbalized understanding.  Labs/tests ordered: Lipid panel in 3 months.   Next Appointment: 08/21/2019 for fasting lab  Sandrea Hughs, NP

## 2019-05-16 NOTE — Patient Instructions (Signed)
- Take Crestor 5 mg tablet one by mouth daily  - Increase physical activity to at least three time per week for 30 minutes. - Low carbohydrates,low saturated fats and high vegetable diet   High Cholesterol  High cholesterol is a condition in which the blood has high levels of a white, waxy, fat-like substance (cholesterol). The human body needs small amounts of cholesterol. The liver makes all the cholesterol that the body needs. Extra (excess) cholesterol comes from the food that we eat. Cholesterol is carried from the liver by the blood through the blood vessels. If you have high cholesterol, deposits (plaques) may build up on the walls of your blood vessels (arteries). Plaques make the arteries narrower and stiffer. Cholesterol plaques increase your risk for heart attack and stroke. Work with your health care provider to keep your cholesterol levels in a healthy range. What increases the risk? This condition is more likely to develop in people who:  Eat foods that are high in animal fat (saturated fat) or cholesterol.  Are overweight.  Are not getting enough exercise.  Have a family history of high cholesterol. What are the signs or symptoms? There are no symptoms of this condition. How is this diagnosed? This condition may be diagnosed from the results of a blood test.  If you are older than age 4, your health care provider may check your cholesterol every 4-6 years.  You may be checked more often if you already have high cholesterol or other risk factors for heart disease. The blood test for cholesterol measures:  "Bad" cholesterol (LDL cholesterol). This is the main type of cholesterol that causes heart disease. The desired level for LDL is less than 100.  "Good" cholesterol (HDL cholesterol). This type helps to protect against heart disease by cleaning the arteries and carrying the LDL away. The desired level for HDL is 60 or higher.  Triglycerides. These are fats that the  body can store or burn for energy. The desired number for triglycerides is lower than 150.  Total cholesterol. This is a measure of the total amount of cholesterol in your blood, including LDL cholesterol, HDL cholesterol, and triglycerides. A healthy number is less than 200. How is this treated? This condition is treated with diet changes, lifestyle changes, and medicines. Diet changes  This may include eating more whole grains, fruits, vegetables, nuts, and fish.  This may also include cutting back on red meat and foods that have a lot of added sugar. Lifestyle changes  Changes may include getting at least 40 minutes of aerobic exercise 3 times a week. Aerobic exercises include walking, biking, and swimming. Aerobic exercise along with a healthy diet can help you maintain a healthy weight.  Changes may also include quitting smoking. Medicines  Medicines are usually given if diet and lifestyle changes have failed to reduce your cholesterol to healthy levels.  Your health care provider may prescribe a statin medicine. Statin medicines have been shown to reduce cholesterol, which can reduce the risk of heart disease. Follow these instructions at home: Eating and drinking If told by your health care provider:  Eat chicken (without skin), fish, veal, shellfish, ground [place] breast, and round or loin cuts of red meat.  Do not eat fried foods or fatty meats, such as hot dogs and salami.  Eat plenty of fruits, such as apples.  Eat plenty of vegetables, such as broccoli, potatoes, and carrots.  Eat beans, peas, and lentils.  Eat grains such as barley, rice, couscous,  and bulgur wheat.  Eat pasta without cream sauces.  Use skim or nonfat milk, and eat low-fat or nonfat yogurt and cheeses.  Do not eat or drink whole milk, cream, ice cream, egg yolks, or hard cheeses.  Do not eat stick margarine or tub margarines that contain trans fats (also called partially hydrogenated  oils).  Do not eat saturated tropical oils, such as coconut oil and palm oil.  Do not eat cakes, cookies, crackers, or other baked goods that contain trans fats.  General instructions  Exercise as directed by your health care provider. Increase your activity level with activities such as gardening, walking, and taking the stairs.  Take over-the-counter and prescription medicines only as told by your health care provider.  Do not use any products that contain nicotine or tobacco, such as cigarettes and e-cigarettes. If you need help quitting, ask your health care provider.  Keep all follow-up visits as told by your health care provider. This is important. Contact a health care provider if:  You are struggling to maintain a healthy diet or weight.  You need help to start on an exercise program.  You need help to stop smoking. Get help right away if:  You have chest pain.  You have trouble breathing. This information is not intended to replace advice given to you by your health care provider. Make sure you discuss any questions you have with your health care provider. Document Revised: 12/30/2016 Document Reviewed: 06/27/2015 Elsevier Patient Education  [place].

## 2019-05-24 ENCOUNTER — Other Ambulatory Visit: Payer: Self-pay | Admitting: Family

## 2019-05-24 DIAGNOSIS — I10 Essential (primary) hypertension: Secondary | ICD-10-CM

## 2019-05-27 ENCOUNTER — Telehealth: Payer: Self-pay | Admitting: *Deleted

## 2019-05-27 DIAGNOSIS — G894 Chronic pain syndrome: Secondary | ICD-10-CM

## 2019-05-27 DIAGNOSIS — M79604 Pain in right leg: Secondary | ICD-10-CM

## 2019-05-27 DIAGNOSIS — M545 Low back pain, unspecified: Secondary | ICD-10-CM

## 2019-05-27 DIAGNOSIS — M25511 Pain in right shoulder: Secondary | ICD-10-CM

## 2019-05-27 NOTE — Telephone Encounter (Signed)
Called and left message for patient to return call.  

## 2019-05-27 NOTE — Telephone Encounter (Signed)
Patient called and stated that Dinah stopped prescribing her patient medication and she is hurting and needs something other than Tylenol and Mobic. Stated that it is not working. Stated that her bones are aching and keeps her up at night and she can't sleep. Stated that she has also tried the [employer] and Heating pad with no relief. Stated that she Has Not gone back to the Orthopaedic because they want to do surgery and she is not ready for that step. Wants something to ease the pain. Please Advise.     OV Note Dated 05/16/2019: 3. Lumbar pain with radiation down right leg - Hydrocodone discontinued due to positive marijuana on drug test and negative for prescribed hydrocodone.Risk for overdose discussed with patient verbalized understanding.   - continue on Mobic 7.5 mg tablet daily  - Advised to add over the counter Salon pas to lower back area  - continue to follow up with Orthopedic for cortisol injections every three months.

## 2019-05-27 NOTE — Telephone Encounter (Signed)
Recommend urgent referral to pain management for further evaluation.

## 2019-05-27 NOTE — Telephone Encounter (Signed)
Referral ordered for Pain management clinic

## 2019-05-27 NOTE — Telephone Encounter (Signed)
Please Place Referral and I will notify patient.

## 2019-05-27 NOTE — Telephone Encounter (Signed)
Patient notified and agreed.  

## 2019-06-24 ENCOUNTER — Other Ambulatory Visit: Payer: Self-pay | Admitting: *Deleted

## 2019-06-24 DIAGNOSIS — F4329 Adjustment disorder with other symptoms: Secondary | ICD-10-CM

## 2019-06-24 DIAGNOSIS — M179 Osteoarthritis of knee, unspecified: Secondary | ICD-10-CM

## 2019-06-24 DIAGNOSIS — M1710 Unilateral primary osteoarthritis, unspecified knee: Secondary | ICD-10-CM

## 2019-06-24 DIAGNOSIS — I10 Essential (primary) hypertension: Secondary | ICD-10-CM

## 2019-06-24 MED ORDER — PAROXETINE HCL 20 MG PO TABS: 20.0000 mg | ORAL_TABLET | Freq: Every day | ORAL | 1 refills | Status: DC

## 2019-06-24 MED ORDER — MELOXICAM 7.5 MG PO TABS: 7.5000 mg | ORAL_TABLET | Freq: Every day | ORAL | 3 refills | Status: DC

## 2019-06-24 NOTE — Telephone Encounter (Signed)
Patient called requesting refills. Stated that she wanted to make sure you didn't discontinue these. Explained to patient that they were in her current medication list and she stated that her pain medication was discontinued.  Pended Rx's and sent to [facility] for approval.

## 2019-07-01 ENCOUNTER — Encounter: Payer: Medicare Other | Admitting: Nurse Practitioner

## 2019-07-03 ENCOUNTER — Telehealth: Payer: Self-pay

## 2019-07-03 NOTE — Telephone Encounter (Signed)
She is already on mobic 7.5 mg tablet recommend increasing to 15 mg tablet daily with food.

## 2019-07-03 NOTE — Telephone Encounter (Signed)
Ms. Kaylee Warner called and wanted a script for 800 mg Ibprophen. She is in a lot of pain now that she does not have her pain medication anymore. She stated she messed up and now Kaylee Warner will not give her anymore pain medicine. She wanted Kaylee Warner to call her so she could speak to her about the issue, but I told her I would deliver the message. Please advise.

## 2019-07-04 MED ORDER — MELOXICAM 15 MG PO TABS: 15.0000 mg | ORAL_TABLET | Freq: Every day | ORAL | 1 refills | Status: DC

## 2019-07-04 NOTE — Addendum Note (Signed)
Addended by: Tanna Savoy on: 07/04/2019 08:29 AM   Modules accepted: Orders

## 2019-07-04 NOTE — Telephone Encounter (Signed)
Per Dinah she increased Carlen's Mobic to 15mg 

## 2019-07-04 NOTE — Telephone Encounter (Signed)
Message given to patient, she said thank you

## 2019-07-09 ENCOUNTER — Encounter: Payer: Self-pay | Admitting: Nurse Practitioner

## 2019-07-09 ENCOUNTER — Telehealth: Payer: Self-pay

## 2019-07-09 ENCOUNTER — Other Ambulatory Visit: Payer: Self-pay

## 2019-07-09 ENCOUNTER — Ambulatory Visit (INDEPENDENT_AMBULATORY_CARE_PROVIDER_SITE_OTHER): Payer: Medicare Other | Admitting: Nurse Practitioner

## 2019-07-09 DIAGNOSIS — E2839 Other primary ovarian failure: Secondary | ICD-10-CM

## 2019-07-09 DIAGNOSIS — Z0000 Encounter for general adult medical examination without abnormal findings: Secondary | ICD-10-CM

## 2019-07-09 NOTE — Progress Notes (Signed)
Subjective:   Kaylee Warner is a 74 y.o. female who presents for Medicare Annual (Subsequent) preventive examination.  Review of Systems    Cardiac Risk Factors include: hypertension;dyslipidemia;advanced age (>38men, >70 women)     Objective:    There were no vitals filed for this visit. There is no height or weight on file to calculate BMI.  Advanced Directives 05/16/2019 11/22/2018 06/27/2018 06/27/2018 01/22/2018 05/15/2017 04/18/2017  Does Patient Have a Medical Advance Directive? No No No No No No No  Type of Advance Directive - - - - - - -  Copy of Healthcare Power of Attorney in Chart? - - - - - - -  Would patient like information on creating a medical advance directive? - Yes (MAU/Ambulatory/Procedural Areas - Information given) No - Patient declined No - Patient declined No - Patient declined Yes (MAU/Ambulatory/Procedural Areas - Information given) -    Current Medications (verified) Outpatient Encounter Medications as of 07/09/2019  Medication Sig  . amLODipine (NORVASC) 10 MG tablet Take 1/2 (one-half) tablet by mouth once daily  . aspirin EC 81 MG tablet Take 1 tablet (81 mg total) by mouth daily.  . Cholecalciferol (VITAMIN D3) 125 MCG (5000 UT) CAPS Take 1 capsule (5,000 Units total) by mouth every morning.  . donepezil (ARICEPT) 10 MG tablet Take 1 tablet (10 mg total) by mouth at bedtime.  . fluticasone (FLONASE) 50 MCG/ACT nasal spray Place 2 sprays into both nostrils daily.  Marland Kitchen lisinopril (ZESTRIL) 40 MG tablet Take 1 tablet by mouth once daily  . Melatonin 3 MG TABS Take 1 tablet (3 mg total) by mouth at bedtime. (Patient taking differently: Take 3 mg by mouth at bedtime. As needed)  . meloxicam (MOBIC) 15 MG tablet Take 1 tablet (15 mg total) by mouth daily.  Marland Kitchen PARoxetine (PAXIL) 20 MG tablet Take 1 tablet (20 mg total) by mouth daily.  . rosuvastatin (CRESTOR) 5 MG tablet Take 1 tablet (5 mg total) by mouth daily.   No facility-administered encounter medications on  file as of 07/09/2019.    Allergies (verified) Celecoxib, Codeine, Fexofenadine, and Penicillins   History: Past Medical History:  Diagnosis Date  . Allergy   . Anemia   . Arthritis   . Hypertension    Past Surgical History:  Procedure Laterality Date  . COLONOSCOPY    . JOINT REPLACEMENT  2008  . SARK 2009     Dr. Jolee Ewing    Family History  Problem Relation Age of Onset  . Diabetes Sister   . Sjogren's syndrome Daughter   . Diabetes Son   . Hypertension Son   . Diabetes Son   . Diabetes Other        family history   . Arthritis Other        family history   . Colon polyps Neg Hx   . Colon cancer Neg Hx   . Esophageal cancer Neg Hx   . Stomach cancer Neg Hx   . Rectal cancer Neg Hx    Social History   Socioeconomic History  . Marital status: Married    Spouse name: Not on file  . Number of children: Not on file  . Years of education: Not on file  . Highest education level: Not on file  Occupational History  . Occupation: cook   Tobacco Use  . Smoking status: Never Smoker  . Smokeless tobacco: Never Used  Vaping Use  . Vaping Use: Never used  Substance and Sexual Activity  .  Alcohol use: Yes    Alcohol/week: 7.0 standard drinks    Types: 7 Glasses of wine per week    Comment: 1 Glass every 2 days  . Drug use: No  . Sexual activity: Never  Other Topics Concern  . Not on file  Social History Narrative   Diet:Regular   Do you drink/eat things with caffeine? Yes   Marital status:  Married                             What year were you married?    Do you live in a house, apartment, assisted living, condo, trailer, etc)? House   Is it one or more stories? 2   How many persons live in your home? 2  Do you have any pets in your home? No   Current or past profession: Regulatory affairs officer   Do you exercise?   Yes                                                  Type & how often: stretches   Do you have a living will? No    Do you have a DNR Form?  No   Do you  have a POA/HPOA forms? No   Social Determinants of Health   Financial Resource Strain:   . Difficulty of Paying Living Expenses:   Food Insecurity:   . Worried About Charity fundraiser in the Last Year:   . Arboriculturist in the Last Year:   Transportation Needs:   . Film/video editor (Medical):   Marland Kitchen Lack of Transportation (Non-Medical):   Physical Activity:   . Days of Exercise per Week:   . Minutes of Exercise per Session:   Stress:   . Feeling of Stress :   Social Connections:   . Frequency of Communication with Friends and Family:   . Frequency of Social Gatherings with Friends and Family:   . Attends Religious Services:   . Active Member of Clubs or Organizations:   . Attends Archivist Meetings:   Marland Kitchen Marital Status:     Tobacco Counseling Counseling given: Not Answered   Clinical Intake:  Pre-visit preparation completed: Yes  Pain : No/denies pain     BMI - recorded: 26.82 Nutritional Status: BMI 25 -29 Overweight Nutritional Risks: None Diabetes: No  How often do you need to have someone help you when you read instructions, pamphlets, or other written materials from your doctor or pharmacy?: 4 - Often  Diabetic?no         Activities of Daily Living In your present state of health, do you have any difficulty performing the following activities: 07/09/2019  Hearing? N  Vision? N  Difficulty concentrating or making decisions? Y  Walking or climbing stairs? N  Dressing or bathing? N  Doing errands, shopping? N  Preparing Food and eating ? N  Using the Toilet? N  In the past six months, have you accidently leaked urine? Y  Do you have problems with loss of bowel control? N  Managing your Medications? N  Managing your Finances? Y  Comment son is helping her  Housekeeping or managing your Housekeeping? N  Some recent data might be hidden    Patient Care Team: Ngetich, Nelda Bucks,  NP as PCP - General (Family Medicine)  Indicate any  recent Medical Services you may have received from other than [facility] providers in the past year (date may be approximate).     Assessment:   This is a routine wellness examination for Kaylee Warner.  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 Patient has not had recent eye exam.  Dietary issues and exercise activities discussed: Current Exercise Habits: Structured exercise class, Type of exercise: calisthenics, Time (Minutes): 45, Frequency (Times/Week): 3, Weekly Exercise (Minutes/Week): 135  Goals    . Exercise 3x per week (30 min per time)     Would like to increase exercise to 3 times weekly for 30 mins.     . Patient Stated     Patient will increase water intake    . Reduce fat intake to X grams per day     Starting 12/16/15, I will attempt to decrease my fat intake, (ie. Eating less snacks foods)       Depression Screen PHQ 2/9 Scores 07/09/2019 04/29/2019 06/27/2018 01/22/2018 01/17/2017 12/15/2016 12/16/2015  PHQ - 2 Score 0 0 0 0 0 0 1    Fall Risk Fall Risk  07/09/2019 05/16/2019 04/29/2019 03/19/2019 11/22/2018  Falls in the past year? 1 0 0 0 0  Number falls in past yr: 1 0 0 0 0  Comment - - - - -  Injury with Fall? 0 0 0 0 -    Any stairs in or around the home? Yes  If so, are there any without handrails? No Home free of loose throw rugs in walkways, pet beds, electrical cords, etc? Yes  Adequate lighting in your home to reduce risk of falls? Yes   ASSISTIVE DEVICES UTILIZED TO PREVENT FALLS:  Life alert? No  Use of a cane, walker or w/c? No  Grab bars in the bathroom? No  Shower chair or bench in shower? No  Elevated toilet seat or a handicapped toilet? No   TIMED UP AND GO:  na  Cognitive Function: MMSE - Mini Mental State Exam 06/27/2018 12/15/2016 12/16/2015 05/01/2015  Not  completed: - - - (No Data)  Orientation to time 5 3 5 4   Orientation to Place 5 5 5 4   Registration 3 3 3 3   Attention/ Calculation 5 5 5 5   Recall 3 2 3 3   Language- name 2 objects 2 2 2 2   Language- repeat 1 1 1 1   Language- follow 3 step command 3 3 3 3   Language- read & follow direction 1 1 1 1   Write a sentence 1 1 1 1   Copy design 1 1 1 1   Total score 30 27 30 28      6CIT Screen 07/09/2019  What Year? 0 points  What month? 0 points  What time? 0 points  Count back from 20 0 points  Months in reverse 4 points  Repeat phrase 6 points  Total Score 10    Immunizations Immunization History  Administered Date(s) Administered  . Fluad Quad(high Dose 65+) 10/29/2018  . Influenza, High Dose Seasonal PF 11/24/2017  . Influenza,inj,Quad PF,6+ Mos 09/24/2015, 12/15/2016  . Moderna SARS-COVID-2 Vaccination 03/29/2019, 04/26/2019  . Pneumococcal Conjugate-13 12/16/2015  . Pneumococcal Polysaccharide-23 06/27/2018  .  Td 01/17/2017  . Tdap 11/23/2010    TDAP status: Up to date Flu Vaccine status: Up to date Pneumococcal vaccine status: Up to date Covid-19 vaccine status: Completed vaccines  Qualifies for Shingles Vaccine? Yes   Zostavax completed No   Shingrix Completed?: No.    Education has been provided regarding the importance of this vaccine. Patient has been advised to call insurance company to determine out of pocket expense if they have not yet received this vaccine. Advised may also receive vaccine at local pharmacy or Health Dept. Verbalized acceptance and understanding.  Screening Tests Health Maintenance  Topic Date Due  . MAMMOGRAM  09/06/2018  . INFLUENZA VACCINE  08/11/2019  . COLONOSCOPY  10/17/2021  . TETANUS/TDAP  01/18/2027  . DEXA SCAN  Completed  . COVID-19 Vaccine  Completed  . Hepatitis C Screening  Completed  . PNA vac Low Risk Adult  Completed    Health Maintenance  Health Maintenance Due  Topic Date Due  . MAMMOGRAM  09/06/2018     Colorectal cancer screening: Completed 2018. Repeat every 5 years Mammogram status: Ordered last year, has not completed. Pt provided with contact info and advised to call to schedule appt.  Bone Density status: Ordered today. Pt provided with contact info and advised to call to schedule appt.  Lung Cancer Screening: (Low Dose CT Chest recommended if Age 54-80 years, 30 pack-year currently smoking OR have quit w/in 15years.) does not qualify.   Lung Cancer Screening Referral: na  Additional Screening:  Hepatitis C Screening: does not qualify; Completed 2021  Vision Screening: Recommended annual ophthalmology exams for early detection of glaucoma and other disorders of the eye. Is the patient up to date with their annual eye exam?  Yes  Who is the provider or what is the name of the office in which the patient attends annual eye exams? Dr Gates Rigg, in [place].  If pt is not established with a provider, would they like to be referred to a provider to establish care? No .   Dental Screening: Recommended annual dental exams for proper oral hygiene  Community Resource Referral / Chronic Care Management: CRR required this visit?  No   CCM required this visit?  No      Plan:     I have personally reviewed and noted the following in the patient's chart:   . Medical and social history . Use of alcohol, tobacco or illicit drugs  . Current medications and supplements . Functional ability and status . Nutritional status . Physical activity . Advanced directives . List of other physicians . Hospitalizations, surgeries, and ER visits in previous 12 months . Vitals . Screenings to include cognitive, depression, and falls . Referrals and appointments  In addition, I have reviewed and discussed with patient certain preventive protocols, quality metrics, and best practice recommendations. A written personalized care plan for preventive services as well as general preventive health  recommendations were provided to patient.     Lauree Chandler, NP   07/09/2019

## 2019-07-09 NOTE — Telephone Encounter (Signed)
Ms. Kaylee Warner, Kaylee Warner are scheduled for a virtual visit with your provider today.    Just as we do with appointments in the office, we must obtain your consent to participate.  Your consent will be active for this visit and any virtual visit you may have with one of our providers in the next 365 days.    If you have a MyChart account, I can also send a copy of this consent to you electronically.  All virtual visits are billed to your insurance company just like a traditional visit in the office.  As this is a virtual visit, video technology does not allow for your provider to perform a traditional examination.  This may limit your provider's ability to fully assess your condition.  If your provider identifies any concerns that need to be evaluated in person or the need to arrange testing such as labs, EKG, etc, we will make arrangements to do so.    Although advances in technology are sophisticated, we cannot ensure that it will always work on either your end or our end.  If the connection with a video visit is poor, we may have to switch to a telephone visit.  With either a video or telephone visit, we are not always able to ensure that we have a secure connection.   I need to obtain your verbal consent now.   Are you willing to proceed with your visit today?   Kaylee Warner has provided verbal consent on 07/09/2019 for a virtual visit (video or telephone).   Carroll Kinds, [facility] 07/09/2019  2:32 PM

## 2019-07-09 NOTE — Progress Notes (Signed)
This service is provided via telemedicine  No vital signs collected/recorded due to the encounter was a telemedicine visit.   Location of patient (ex: home, work):  Home  Patient consents to a telephone visit:  Yes, see encounter dated 07/09/2019  Location of the provider (ex: office, home):  [place]  Name of any referring provider:  Marlowe Sax, NP  Names of all persons participating in the telemedicine service and their role in the encounter:  Sherrie Mustache, Nurse Practitioner, Carroll Kinds, CMA, and patient.   Time spent on call:  9 minutes with medical assistant

## 2019-07-09 NOTE — Patient Instructions (Addendum)
Kaylee Warner , Thank you for taking time to come for your Medicare Wellness Visit. I appreciate your ongoing commitment to your health goals. Please review the following plan we discussed and let me know if I can assist you in the future.   Screening recommendations/referrals: Colonoscopy up to date, due 2023 Mammogram to call and make appt, [phone] Bone Density to call and make appt at [phone] Recommended yearly ophthalmology/optometry visit for glaucoma screening and checkup Recommended yearly dental visit for hygiene and checkup  Vaccinations: Influenza vaccine up to date Pneumococcal vaccine up to date Tdap vaccine up to date Shingles vaccine RECOMMENDED, to get at your local pharmacy.     Advanced directives: recommended to complete and bring back to office.   Conditions/risks identified: memory loss, advanced age, hypertension  Next appointment: 1 year   Preventive Care 67 Years and Older, Female Preventive care refers to lifestyle choices and visits with your health care provider that can promote health and wellness. What does preventive care include?  A yearly physical exam. This is also called an annual well check.  Dental exams once or twice a year.  Routine eye exams. Ask your health care provider how often you should have your eyes checked.  Personal lifestyle choices, including:  Daily care of your teeth and gums.  Regular physical activity.  Eating a healthy diet.  Avoiding tobacco and drug use.  Limiting alcohol use.  Practicing safe sex.  Taking low-dose aspirin every day.  Taking vitamin and mineral supplements as recommended by your health care provider. What happens during an annual well check? The services and screenings done by your health care provider during your annual well check will depend on your age, overall health, lifestyle risk factors, and family history of  disease. Counseling  Your health care provider may ask you questions about your:  Alcohol use.  Tobacco use.  Drug use.  Emotional well-being.  Home and relationship well-being.  Sexual activity.  Eating habits.  History of falls.  Memory and ability to understand (cognition).  Work and work Statistician.  Reproductive health. Screening  You may have the following tests or measurements:  Height, weight, and BMI.  Blood pressure.  Lipid and cholesterol levels. These may be checked every 5 years, or more frequently if you are over 72 years old.  Skin check.  Lung cancer screening. You may have this screening every year starting at age 58 if you have a 30-pack-year history of smoking and currently smoke or have quit within the past 15 years.  Fecal occult blood test (FOBT) of the stool. You may have this test every year starting at age 62.  Flexible sigmoidoscopy or colonoscopy. You may have a sigmoidoscopy every 5 years or a colonoscopy every 10 years starting at age 36.  Hepatitis C blood test.  Hepatitis B blood test.  Sexually transmitted disease (STD) testing.  Diabetes screening. This is done by checking your blood sugar (glucose) after you have not eaten for a while (fasting). You may have this done every 1-3 years.  Bone density scan. This is done to screen for osteoporosis. You may have this done starting at age 78.  Mammogram. This may be done every 1-2 years. Talk to your health care provider about how often you should have regular mammograms. Talk with your health care provider about your test results, treatment options, and if necessary, the need for more tests. Vaccines  Your health care provider  may recommend certain vaccines, such as:  Influenza vaccine. This is recommended every year.  Tetanus, diphtheria, and acellular pertussis (Tdap, Td) vaccine. You may need a Td booster every 10 years.  Zoster vaccine. You may need this after age  83.  Pneumococcal 13-valent conjugate (PCV13) vaccine. One dose is recommended after age 14.  Pneumococcal polysaccharide (PPSV23) vaccine. One dose is recommended after age 78. Talk to your health care provider about which screenings and vaccines you need and how often you need them. This information is not intended to replace advice given to you by your health care provider. Make sure you discuss any questions you have with your health care provider. Document Released: 01/23/2015 Document Revised: 09/16/2015 Document Reviewed: 10/28/2014 Elsevier Interactive Patient Education  2017 [employer] Prevention in the Home Falls can cause injuries. They can happen to people of all ages. There are many things you can do to make your home safe and to help prevent falls. What can I do on the outside of my home?  Regularly fix the edges of walkways and driveways and fix any cracks.  Remove anything that might make you trip as you walk through a door, such as a raised step or threshold.  Trim any bushes or trees on the path to your home.  Use bright outdoor lighting.  Clear any walking paths of anything that might make someone trip, such as rocks or tools.  Regularly check to see if handrails are loose or broken. Make sure that both sides of any steps have handrails.  Any raised decks and porches should have guardrails on the edges.  Have any leaves, snow, or ice cleared regularly.  Use sand or salt on walking paths during winter.  Clean up any spills in your garage right away. This includes oil or grease spills. What can I do in the bathroom?  Use night lights.  Install grab bars by the toilet and in the tub and shower. Do not use towel bars as grab bars.  Use non-skid mats or decals in the tub or shower.  If you need to sit down in the shower, use a plastic, non-slip stool.  Keep the floor dry. Clean up any water that spills on the floor as soon as it happens.  Remove  soap buildup in the tub or shower regularly.  Attach bath mats securely with double-sided non-slip rug tape.  Do not have throw rugs and other things on the floor that can make you trip. What can I do in the bedroom?  Use night lights.  Make sure that you have a light by your bed that is easy to reach.  Do not use any sheets or blankets that are too big for your bed. They should not hang down onto the floor.  Have a firm chair that has side arms. You can use this for support while you get dressed.  Do not have throw rugs and other things on the floor that can make you trip. What can I do in the kitchen?  Clean up any spills right away.  Avoid walking on wet floors.  Keep items that you use a lot in easy-to-reach places.  If you need to reach something above you, use a strong step stool that has a grab bar.  Keep electrical cords out of the way.  Do not use floor polish or wax that makes floors slippery. If you must use wax, use non-skid floor wax.  Do not have throw rugs  and other things on the floor that can make you trip. What can I do with my stairs?  Do not leave any items on the stairs.  Make sure that there are handrails on both sides of the stairs and use them. Fix handrails that are broken or loose. Make sure that handrails are as long as the stairways.  Check any carpeting to make sure that it is firmly attached to the stairs. Fix any carpet that is loose or worn.  Avoid having throw rugs at the top or bottom of the stairs. If you do have throw rugs, attach them to the floor with carpet tape.  Make sure that you have a light switch at the top of the stairs and the bottom of the stairs. If you do not have them, ask someone to add them for you. What else can I do to help prevent falls?  Wear shoes that:  Do not have high heels.  Have rubber bottoms.  Are comfortable and fit you well.  Are closed at the toe. Do not wear sandals.  If you use a  stepladder:  Make sure that it is fully opened. Do not climb a closed stepladder.  Make sure that both sides of the stepladder are locked into place.  Ask someone to hold it for you, if possible.  Clearly mark and make sure that you can see:  Any grab bars or handrails.  First and last steps.  Where the edge of each step is.  Use tools that help you move around (mobility aids) if they are needed. These include:  Canes.  Walkers.  Scooters.  Crutches.  Turn on the lights when you go into a dark area. Replace any light bulbs as soon as they burn out.  Set up your furniture so you have a clear path. Avoid moving your furniture around.  If any of your floors are uneven, fix them.  If there are any pets around you, be aware of where they are.  Review your medicines with your doctor. Some medicines can make you feel dizzy. This can increase your chance of falling. Ask your doctor what other things that you can do to help prevent falls. This information is not intended to replace advice given to you by your health care provider. Make sure you discuss any questions you have with your health care provider. Document Released: 10/23/2008 Document Revised: 06/04/2015 Document Reviewed: 01/31/2014 Elsevier Interactive Patient Education  2017 [employer].

## 2019-07-12 ENCOUNTER — Other Ambulatory Visit: Payer: Self-pay | Admitting: Nurse Practitioner

## 2019-07-12 DIAGNOSIS — Z1231 Encounter for screening mammogram for malignant neoplasm of breast: Secondary | ICD-10-CM

## 2019-08-21 ENCOUNTER — Other Ambulatory Visit: Payer: Medicare Other

## 2019-08-28 ENCOUNTER — Telehealth: Payer: Self-pay

## 2019-08-28 NOTE — Telephone Encounter (Signed)
Called patient to cancel her Lab appointment for 09/02/2019 and reschedule it for her as instructed by Marlowe Sax NP got her voice machine left message to call office about her up coming Lab appointment and to ask for Gay Filler when she called  I have rescheduled her apointment  for October 20/2021 at 8:00 am 2 days before her appointment with Marlowe Sax NP and I just wanted to make sure the time and day was okay with patients

## 2019-08-28 NOTE — Telephone Encounter (Signed)
Patient called the office and I let her know that the 09/02/19 lab appointment had been canceled. She stated 10/30/19 lab appointment was okay with her.

## 2019-09-03 ENCOUNTER — Other Ambulatory Visit: Payer: Medicare Other

## 2019-09-06 ENCOUNTER — Other Ambulatory Visit: Payer: Self-pay | Admitting: Family

## 2019-09-06 DIAGNOSIS — I10 Essential (primary) hypertension: Secondary | ICD-10-CM

## 2019-09-10 ENCOUNTER — Ambulatory Visit (INDEPENDENT_AMBULATORY_CARE_PROVIDER_SITE_OTHER): Payer: Medicare Other | Admitting: Family

## 2019-09-10 ENCOUNTER — Ambulatory Visit
Admission: RE | Admit: 2019-09-10 | Discharge: 2019-09-10 | Disposition: A | Discharge status: Home / Self Care | Payer: Medicare Other | Source: Ambulatory Visit | Attending: Family | Admitting: Family

## 2019-09-10 ENCOUNTER — Encounter: Payer: Self-pay | Admitting: Family

## 2019-09-10 ENCOUNTER — Other Ambulatory Visit: Payer: Self-pay

## 2019-09-10 VITALS — BP 120/68 | HR 84 | Temp 96.9°F | Resp 16 | Ht 69.0 in | Wt 179.0 lb

## 2019-09-10 DIAGNOSIS — I10 Essential (primary) hypertension: Secondary | ICD-10-CM

## 2019-09-10 DIAGNOSIS — Z789 Other specified health status: Secondary | ICD-10-CM | POA: Diagnosis not present

## 2019-09-10 DIAGNOSIS — M79671 Pain in right foot: Secondary | ICD-10-CM | POA: Diagnosis not present

## 2019-09-10 DIAGNOSIS — E782 Mixed hyperlipidemia: Secondary | ICD-10-CM | POA: Diagnosis not present

## 2019-09-10 MED ORDER — LISINOPRIL 40 MG PO TABS: 40.0000 mg | ORAL_TABLET | Freq: Every day | ORAL | 1 refills | Status: DC

## 2019-09-10 NOTE — Patient Instructions (Addendum)
Take mobic 15 mg tablet daily as directed. Please get right foot X-ray done at [facility] at [place] your health care provider which exercises are safe for you. Do exercises exactly as told by your health care provider and adjust them as directed. It is normal to feel mild stretching, pulling, tightness, or discomfort as you do these exercises. Stop right away if you feel sudden pain or your pain gets worse. Do not begin these exercises until told by your health care provider. Stretching and range-of-motion exercises These exercises warm up your muscles and joints and improve the movement and flexibility of your foot. These exercises also help to relieve pain. Plantar fascia stretch  1. Sit with your left / right leg crossed over your opposite knee. 2. Hold your heel with one hand with that thumb near your arch. With your other hand, hold your toes and gently pull them back toward the top of your foot. You should feel a stretch on the bottom of your toes or your foot (plantar fascia) or both. 3. Hold this stretch for__________ seconds. 4. Slowly release your toes and return to the starting position. Repeat __________ times. Complete this exercise __________ times a day. Gastrocnemius stretch, standing This exercise is also called a calf (gastroc) stretch. It stretches the muscles in the back of the upper calf. 1. Stand with your hands against a wall. 2. Extend your left / right leg behind you, and bend your front knee slightly. 3. Keeping your heels on the floor and your back knee straight, shift your weight toward the wall. Do not arch your back. You should feel a gentle stretch in your upper left / right calf. 4. Hold this position for __________ seconds. Repeat __________ times. Complete this exercise __________ times a day. Soleus stretch, standing This exercise is also called a calf (soleus) stretch. It stretches the muscles in the  back of the lower calf. 1. Stand with your hands against a wall. 2. Extend your left / right leg behind you, and bend your front knee slightly. 3. Keeping your heels on the floor, bend your back knee and shift your weight slightly over your back leg. You should feel a gentle stretch deep in your lower calf. 4. Hold this position for __________ seconds. Repeat __________ times. Complete this exercise __________ times a day. Gastroc and soleus stretch, standing step This exercise stretches the muscles in the back of the lower leg. These muscles are in the upper calf (gastrocnemius) and the lower calf (soleus). 1. Stand with the ball of your left / right foot on a step. The ball of your foot is on the walking surface, right under your toes. 2. Keep your other foot firmly on the same step. 3. Hold on to the wall or a railing for balance. 4. Slowly lift your other foot, allowing your body weight to press your left / right heel down over the edge of the step. You should feel a stretch in your left / right calf. 5. Hold this position for __________ seconds. 6. Return both feet to the step. 7. Repeat this exercise with a slight bend in your left / right knee. Repeat __________ times with your left / right knee straight and __________ times with your left / right knee bent. Complete this exercise __________ times a day. Balance exercise This exercise builds your balance and strength control of your arch to help take pressure off your plantar  fascia. Single leg stand If this exercise is too easy, you can try it with your eyes closed or while standing on a pillow. 1. Without shoes, stand near a railing or in a doorway. You may hold on to the railing or door frame as needed. 2. Stand on your left / right foot. Keep your big toe down on the floor and try to keep your arch lifted. Do not let your foot roll inward. 3. Hold this position for __________ seconds. Repeat __________ times. Complete this exercise  __________ times a day. This information is not intended to replace advice given to you by your health care provider. Make sure you discuss any questions you have with your health care provider. Document Revised: 04/19/2018 Document Reviewed: 10/25/2017 Elsevier Patient Education  [employer].

## 2019-09-10 NOTE — Progress Notes (Signed)
Provider: Marlowe Sax FNP-C  Lakeyn Dokken, Nelda Bucks, NP  Patient Care Team: Ty Oshima, Nelda Bucks, NP as PCP - General (Family Medicine)  Extended Emergency Contact Information Primary Emergency Contact: [place] of [place] Phone: [phone] Relation: Son Secondary Emergency Contact: Enloe,James R Address: [address]          [place],  [address] [place] Phone: [phone] Relation: Spouse  Code Status:  Full Code  Goals of care: Advanced Directive information Advanced Directives 09/10/2019  Does Patient Have a Medical Advance Directive? No  Type of Advance Directive -  Copy of [place] in Chart? -  Would patient like information on creating a medical advance directive? No - Patient declined     Chief Complaint  Patient presents with  . Acute Visit    Right foot, and right heel pain x 4 days.    HPI:  Pt is a 74 y.o. female seen today for an acute visit for evaluation of right foot, and right heel pain x 4 days.Pain is described as pressure when standing.More painful in the morning when stepping out of bed.No injuries.she denies any numbness or tingling on the foot. Has taken ibuprofen without any relief.No sprain or fall.she wears open sandals most of the time and high heels sometimes.she denies any redness or swelling on heel.Has mobic but has not been taking it.   States has stopped taking her crestor due to cramping and achiness.Has been going to the gym for water aerobics.Also states has modified her diet ans previous advised.Has upcoming appointment in october to recheck lipid.she has lost 3 lbs since last visit 3 months ago.congratulated for her dietary modification and exercise.   Past Medical History:  Diagnosis Date  . Allergy   . Anemia   . Arthritis   . Hypertension    Past Surgical History:  Procedure Laterality Date  . COLONOSCOPY    . JOINT REPLACEMENT  2008  . SARK 2009     Dr.  Jolee Ewing     Allergies  Allergen Reactions  . Celecoxib     REACTION: blood pressure went up  . Codeine Itching  . Fexofenadine Other (See Comments)    unknown  . Penicillins Rash    Outpatient Encounter Medications as of 09/10/2019  Medication Sig  . amLODipine (NORVASC) 10 MG tablet Take 1/2 (one-half) tablet by mouth once daily  . aspirin EC 81 MG tablet Take 1 tablet (81 mg total) by mouth daily.  . Cholecalciferol (VITAMIN D3) 125 MCG (5000 UT) CAPS Take 1 capsule (5,000 Units total) by mouth every morning.  . donepezil (ARICEPT) 10 MG tablet Take 1 tablet (10 mg total) by mouth at bedtime.  . fluticasone (FLONASE) 50 MCG/ACT nasal spray Place 2 sprays into both nostrils daily.  Marland Kitchen lisinopril (ZESTRIL) 40 MG tablet Take 1 tablet by mouth once daily  . melatonin 3 MG TABS tablet Take 3 mg by mouth as needed.  . meloxicam (MOBIC) 15 MG tablet Take 1 tablet (15 mg total) by mouth daily.  Marland Kitchen PARoxetine (PAXIL) 20 MG tablet Take 1 tablet (20 mg total) by mouth daily.  . rosuvastatin (CRESTOR) 5 MG tablet Take 1 tablet (5 mg total) by mouth daily.  . [DISCONTINUED] Melatonin 3 MG TABS Take 1 tablet (3 mg total) by mouth at bedtime. (Patient taking differently: Take 3 mg by mouth at bedtime. As needed)   No facility-administered encounter medications on file as of 09/10/2019.  Review of Systems  Constitutional: Negative for appetite change, chills, fatigue and fever.  Respiratory: Negative for cough, chest tightness, shortness of breath and wheezing.   Cardiovascular: Negative for chest pain, palpitations and leg swelling.  Musculoskeletal: Positive for arthralgias. Negative for gait problem, joint swelling, myalgias and neck pain.  Skin: Negative for color change, pallor and rash.  Neurological: Negative for dizziness, speech difficulty, weakness, numbness and headaches.    Immunization History  Administered Date(s) Administered  . Fluad Quad(high Dose 65+) 10/29/2018  .  Influenza, High Dose Seasonal PF 11/24/2017  . Influenza,inj,Quad PF,6+ Mos 09/24/2015, 12/15/2016  . Moderna SARS-COVID-2 Vaccination 03/29/2019, 04/26/2019  . Pneumococcal Conjugate-13 12/16/2015  . Pneumococcal Polysaccharide-23 06/27/2018  . Td 01/17/2017  . Tdap 11/23/2010   Pertinent  Health Maintenance Due  Topic Date Due  . MAMMOGRAM  09/06/2018  . INFLUENZA VACCINE  08/11/2019  . COLONOSCOPY  10/17/2021  . DEXA SCAN  Completed  . PNA vac Low Risk Adult  Completed   Fall Risk  09/10/2019 07/09/2019 05/16/2019 04/29/2019 03/19/2019  Falls in the past year? 0 1 0 0 0  Number falls in past yr: 0 1 0 0 0  Comment - - - - -  Injury with Fall? 0 0 0 0 0    Vitals:   09/10/19 0813  BP: 120/68  Pulse: 84  Resp: 16  Temp: (!) 96.9 F (36.1 C)  SpO2: 96%  Weight: 179 lb (81.2 kg)  Height: 5\' 9"  (1.753 m)   Body mass index is 26.43 kg/m. Physical Exam Vitals reviewed.  Constitutional:      General: She is not in acute distress.    Appearance: She is not ill-appearing.  HENT:     Head: Normocephalic.  Cardiovascular:     Pulses: Normal pulses.     Heart sounds: Normal heart sounds. No murmur heard.  No friction rub. No gallop.   Pulmonary:     Effort: Pulmonary effort is normal. No respiratory distress.     Breath sounds: Normal breath sounds. No wheezing, rhonchi or rales.  Chest:     Chest wall: No tenderness.  Musculoskeletal:     Right lower leg: No edema.     Left lower leg: No edema.     Right foot: Normal range of motion and normal capillary refill. Bunion and tenderness present. No swelling, bony tenderness or crepitus.     Left foot: Normal.       Feet:  Skin:    General: Skin is warm and dry.     Coloration: Skin is not pale.     Findings: No bruising, erythema or rash.  Neurological:     Mental Status: She is alert and oriented to person, place, and time.     Cranial Nerves: No cranial nerve deficit.     Sensory: No sensory deficit.     Motor: No  weakness.     Coordination: Coordination normal.     Gait: Gait normal.  Psychiatric:        Mood and Affect: Mood normal.        Behavior: Behavior normal.        Thought Content: Thought content normal.        Judgment: Judgment normal.    Labs reviewed: Recent Labs    10/24/18 0852 05/08/19 0821  NA 142 142  K 4.0 4.3  CL 108 109  CO2 26 25  GLUCOSE 96 106*  BUN 11 14  CREATININE 0.60 0.56*  CALCIUM 9.2 9.2   Recent Labs    10/24/18 0852 05/08/19 0821  AST 13 13  ALT 9 10  BILITOT 0.7 0.6  PROT 6.3 6.7   Recent Labs    10/24/18 0852 05/08/19 0821  WBC 5.3 5.0  NEUTROABS 3,101 2,705  HGB 12.1 12.3  HCT 38.5 39.3  MCV 76.7* 77.2*  PLT 317 353   Lab Results  Component Value Date   TSH 1.88 05/08/2019   No results found for: HGBA1C Lab Results  Component Value Date   CHOL 231 (H) 05/08/2019   HDL 53 05/08/2019   LDLCALC 154 (H) 05/08/2019   TRIG 119 05/08/2019   CHOLHDL 4.4 05/08/2019    Significant Diagnostic Results in last 30 days:  No results found.  Assessment/Plan 1. Essential hypertension B/p at goal.request lisinopril refill.continue on Lisinopril and amlodipine.  Continue on ASA for cardiac event prophylaxis.Has stopped Crestor due to muscle cramping and achyness.  - lisinopril (ZESTRIL) 40 MG tablet; Take 1 tablet (40 mg total) by mouth daily.  Dispense: 90 tablet; Refill: 1  2. Pain of right heel Pain worst with first step in the morning and standing. - Advised to take mobic 15 mg tablet daily as directed. - Additional education information for foot exercise provided on AVS  - Advised to avoid sandals and wear supportive shoes. - will obtain imaging to rule out other acute abnormalities.then refer to foot specialist if still has no improvement might need a cortisol injection to help with inflammation.  - DG Foot Complete Right; Future   3. Statin intolerance Reports muscle cramping and aches when taking Crestor 5 mg tablet.  4.  Mixed hyperlipidemia Latest cholesterol 231 and LDL 154 off Crestor as above. - continue with dietary modification and exercise.Has upcoming appointment to recheck lipid panel October.  Family/ staff Communication: Reviewed plan of care with patient  Labs/tests ordered:   - DG Foot Complete Right; Future   Next Appointment: As needed if symptoms worsen or fail to improve.  Sandrea Hughs, NP

## 2019-09-11 ENCOUNTER — Other Ambulatory Visit: Payer: Self-pay

## 2019-09-11 DIAGNOSIS — M79671 Pain in right foot: Secondary | ICD-10-CM

## 2019-09-11 NOTE — Progress Notes (Signed)
Urgent referral send to Podiatry

## 2019-09-13 ENCOUNTER — Ambulatory Visit: Payer: Self-pay

## 2019-09-13 ENCOUNTER — Other Ambulatory Visit: Payer: Self-pay

## 2019-09-13 ENCOUNTER — Encounter: Payer: Self-pay | Admitting: Podiatry

## 2019-09-13 ENCOUNTER — Ambulatory Visit: Payer: Medicare Other | Admitting: Podiatry

## 2019-09-13 DIAGNOSIS — M722 Plantar fascial fibromatosis: Secondary | ICD-10-CM | POA: Diagnosis not present

## 2019-09-13 NOTE — Patient Instructions (Signed)

## 2019-09-17 ENCOUNTER — Telehealth: Payer: Self-pay | Admitting: Podiatrist

## 2019-09-17 NOTE — Telephone Encounter (Signed)
Let her know that it is very individual dependent.  When I see her back again in few weeks I will reassess and if there is still some residual pain I can do another injection to make her feel better.  If you have any questions let me know   Thank you

## 2019-09-17 NOTE — Telephone Encounter (Signed)
I returned her call and discussed the steroid injection- she asked how long until it completely kicked in and I told her 2-10 days for most patients but it is different for each person.  I also told her to ice and to do the stretching exercises you gave her.  She agreed and will be seen for her follow up with you.

## 2019-09-17 NOTE — Telephone Encounter (Signed)
Patient called and left a vm this am asking about the injection she had on Friday-  States the foot feels better but not totally well.  She has a question about how long the medicine will work?  Happy to return her call if you want to let me know what to tell her.  Thanks!

## 2019-09-18 ENCOUNTER — Encounter: Payer: Self-pay | Admitting: Podiatry

## 2019-09-18 NOTE — Progress Notes (Signed)
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 female presents with the above complaint.  Patient presents with right heel pain that has been going on for quite some time.  Patient states that she is having trouble walking as well as ambulating depending on the sneakers.  Is sharp burning especially when walking.  She would like to get it evaluated.  She does have a history of plantar fasciitis.  She has not seen anyone else prior to seeing me.  She had a recent x-ray that was done at an outside office.   Review of Systems: Negative except as noted in the HPI. Denies N/V/F/Ch.  Past Medical History:  Diagnosis Date   Allergy    Anemia    Arthritis    Hypertension     Current Outpatient Medications:    amLODipine (NORVASC) 10 MG tablet, Take 1/2 (one-half) tablet by mouth once daily, Disp: 135 tablet, Rfl: 1   aspirin EC 81 MG tablet, Take 1 tablet (81 mg total) by mouth daily., Disp: 30 tablet, Rfl: 3   Cholecalciferol (VITAMIN D3) 125 MCG (5000 UT) CAPS, Take 1 capsule (5,000 Units total) by mouth every morning., Disp: 30 capsule, Rfl: 0   donepezil (ARICEPT) 10 MG tablet, Take 1 tablet (10 mg total) by mouth at bedtime., Disp: 90 tablet, Rfl: 1   fluticasone (FLONASE) 50 MCG/ACT nasal spray, Place 2 sprays into both nostrils daily., Disp: 16 g, Rfl: 6   lisinopril (ZESTRIL) 40 MG tablet, Take 1 tablet (40 mg total) by mouth daily., Disp: 90 tablet, Rfl: 1   melatonin 3 MG TABS tablet, Take 3 mg by mouth as needed., Disp: , Rfl:    meloxicam (MOBIC) 15 MG tablet, Take 1 tablet (15 mg total) by mouth daily., Disp: 30 tablet, Rfl: 1   PARoxetine (PAXIL) 20 MG tablet, Take 1 tablet (20 mg total) by mouth daily., Disp: 90 tablet, Rfl:  1  Social History   Tobacco Use  Smoking Status Never Smoker  Smokeless Tobacco Never Used    Allergies  Allergen Reactions   Celecoxib     REACTION: blood pressure went up   Codeine Itching   Crestor [Rosuvastatin Calcium]     Muscle cramping and achy   Fexofenadine Other (See Comments)    unknown   Penicillins Rash   Objective:  There were no vitals filed for this visit. There is no height or weight on file to calculate BMI. Constitutional Well developed. Well nourished.  Vascular Dorsalis pedis pulses palpable bilaterally. Posterior tibial pulses palpable bilaterally. Capillary refill normal to all digits.  No cyanosis or clubbing noted. Pedal hair growth normal.  Neurologic Normal speech. Oriented to person, place, and time. Epicritic sensation to light touch grossly present bilaterally.  Dermatologic Nails well groomed and normal in appearance. No open wounds. No skin lesions.  Orthopedic: Normal joint ROM without pain or crepitus bilaterally. No visible deformities. Tender to palpation at the calcaneal tuber right. No pain with calcaneal squeeze right. Ankle ROM diminished range of motion right. Silfverskiold Test: positive right.   Radiographs: Taken and reviewed. No acute fractures or dislocations. No evidence of stress fracture.  Plantar heel spur present. Posterior heel spur absent.   Assessment:  1. Plantar fasciitis of right foot    Plan:  Patient was evaluated and treated and all questions answered.  Plantar Fasciitis, right - XR reviewed as above.  - Educated on icing and stretching. Instructions given.  - Injection delivered to the plantar fascia as below. - DME: Plantar Fascial Brace - Pharmacologic management: Meloxicam/Medrol Dose Pak. Educated on risks/benefits and proper taking of medication.  Procedure: Injection Tendon/Ligament Location: Right plantar fascia at the glabrous junction; medial approach. Skin Prep:  alcohol Injectate: 0.5 cc 0.5% marcaine plain, 0.5 cc of 1% Lidocaine, 0.5 cc kenalog 10. Disposition: Patient tolerated procedure well. Injection site dressed with a band-aid.  Return in about 4 weeks (around 10/11/2019) for right foot pain.

## 2019-10-02 ENCOUNTER — Ambulatory Visit: Payer: Medicare Other

## 2019-10-02 ENCOUNTER — Other Ambulatory Visit: Payer: Medicare Other

## 2019-10-09 ENCOUNTER — Other Ambulatory Visit: Payer: Self-pay

## 2019-10-09 ENCOUNTER — Ambulatory Visit: Payer: Medicare Other | Admitting: Podiatry

## 2019-10-09 DIAGNOSIS — M722 Plantar fascial fibromatosis: Secondary | ICD-10-CM | POA: Diagnosis not present

## 2019-10-09 DIAGNOSIS — M7731 Calcaneal spur, right foot: Secondary | ICD-10-CM | POA: Diagnosis not present

## 2019-10-10 ENCOUNTER — Encounter: Payer: Self-pay | Admitting: Podiatry

## 2019-10-10 NOTE — Progress Notes (Signed)
Subjective:  Patient ID: Kaylee Warner, female    DOB: 1945-05-01,  MRN: [id number]  Chief Complaint  Patient presents with  . Plantar Fasciitis    Pt states medication and injections ineffective, but PF brace has been helpful when walking.    74 y.o. female presents with the above complaint.  Patient presents with a follow-up of right heel pain.  Patient states the brace is helping while walking on it.  Patient states that her pain did get a little bit better but she did a lot on it afterwards which may have impacted the injection itself.  She denies any other acute complaints.  She would like to discuss doing another injection shot to see if this can help after allowing her foot to rest up well after the shot.  She denies any other acute complaints.   Review of Systems: Negative except as noted in the HPI. Denies N/V/F/Ch.  Past Medical History:  Diagnosis Date  . Allergy   . Anemia   . Arthritis   . Hypertension     Current Outpatient Medications:  .  amLODipine (NORVASC) 10 MG tablet, Take 1/2 (one-half) tablet by mouth once daily, Disp: 135 tablet, Rfl: 1 .  aspirin EC 81 MG tablet, Take 1 tablet (81 mg total) by mouth daily., Disp: 30 tablet, Rfl: 3 .  Cholecalciferol (VITAMIN D3) 125 MCG (5000 UT) CAPS, Take 1 capsule (5,000 Units total) by mouth every morning., Disp: 30 capsule, Rfl: 0 .  dexamethasone 0.5 MG/5ML elixir, Take 0.5 mg by mouth 4 (four) times daily., Disp: , Rfl:  .  donepezil (ARICEPT) 10 MG tablet, Take 1 tablet (10 mg total) by mouth at bedtime., Disp: 90 tablet, Rfl: 1 .  fluticasone (FLONASE) 50 MCG/ACT nasal spray, Place 2 sprays into both nostrils daily., Disp: 16 g, Rfl: 6 .  lisinopril (ZESTRIL) 40 MG tablet, Take 1 tablet (40 mg total) by mouth daily., Disp: 90 tablet, Rfl: 1 .  melatonin 3 MG TABS tablet, Take 3 mg by mouth as needed., Disp: , Rfl:  .  meloxicam (MOBIC) 15 MG tablet, Take 1 tablet (15 mg total) by mouth daily., Disp: 30 tablet, Rfl:  1 .  PARoxetine (PAXIL) 20 MG tablet, Take 1 tablet (20 mg total) by mouth daily., Disp: 90 tablet, Rfl: 1  Social History   Tobacco Use  Smoking Status Never Smoker  Smokeless Tobacco Never Used    Allergies  Allergen Reactions  . Celecoxib     REACTION: blood pressure went up  . Codeine Itching  . Crestor [Rosuvastatin Calcium]     Muscle cramping and achy  . Fexofenadine Other (See Comments)    unknown  . Penicillins Rash   Objective:  There were no vitals filed for this visit. There is no height or weight on file to calculate BMI. Constitutional Well developed. Well nourished.  Vascular Dorsalis pedis pulses palpable bilaterally. Posterior tibial pulses palpable bilaterally. Capillary refill normal to all digits.  No cyanosis or clubbing noted. Pedal hair growth normal.  Neurologic Normal speech. Oriented to person, place, and time. Epicritic sensation to light touch grossly present bilaterally.  Dermatologic Nails well groomed and normal in appearance. No open wounds. No skin lesions.  Orthopedic: Normal joint ROM without pain or crepitus bilaterally. No visible deformities. Tender to palpation at the calcaneal tuber right. No pain with calcaneal squeeze right. Ankle ROM diminished range of motion right. Silfverskiold Test: positive right.   Radiographs: Taken and reviewed. No acute fractures  or dislocations. No evidence of stress fracture.  Plantar heel spur present. Posterior heel spur absent.   Assessment:   1. Plantar fasciitis of right foot   2. Heel spur, right    Plan:  Patient was evaluated and treated and all questions answered.  Plantar Fasciitis, right with underlying heel spur right - XR reviewed as above.  - Educated on icing and stretching. Instructions given.  -Second injection delivered to the plantar fascia as below. - DME: Continue using plantar Fascial Brace - Pharmacologic management: None  Procedure: Injection  Tendon/Ligament Location: Right plantar fascia at the glabrous junction; medial approach. Skin Prep: alcohol Injectate: 0.5 cc 0.5% marcaine plain, 0.5 cc of 1% Lidocaine, 0.5 cc kenalog 10. Disposition: Patient tolerated procedure well. Injection site dressed with a band-aid.  No follow-ups on file.

## 2019-10-14 ENCOUNTER — Other Ambulatory Visit: Payer: Self-pay

## 2019-10-14 ENCOUNTER — Ambulatory Visit: Payer: Medicare Other | Admitting: Student in an Organized Health Care Education/Training Program

## 2019-10-14 ENCOUNTER — Telehealth: Payer: Self-pay

## 2019-10-14 NOTE — Telephone Encounter (Signed)
Left message to confirm appt and to bring H&P and list of medications to her appt tomorrow.

## 2019-10-15 ENCOUNTER — Other Ambulatory Visit: Payer: Self-pay

## 2019-10-15 ENCOUNTER — Encounter: Payer: Self-pay | Admitting: Student in an Organized Health Care Education/Training Program

## 2019-10-15 ENCOUNTER — Ambulatory Visit
Admission: RE | Admit: 2019-10-15 | Discharge: 2019-10-15 | Disposition: A | Discharge status: Home / Self Care | Payer: Medicare Other | Source: Ambulatory Visit | Attending: Student in an Organized Health Care Education/Training Program | Admitting: Student in an Organized Health Care Education/Training Program

## 2019-10-15 ENCOUNTER — Ambulatory Visit: Payer: Medicare Other | Admitting: Student in an Organized Health Care Education/Training Program

## 2019-10-15 VITALS — BP 144/95 | HR 77 | Temp 97.5°F | Resp 18 | Ht 69.0 in | Wt 175.0 lb

## 2019-10-15 DIAGNOSIS — M19011 Primary osteoarthritis, right shoulder: Secondary | ICD-10-CM | POA: Insufficient documentation

## 2019-10-15 DIAGNOSIS — G8929 Other chronic pain: Secondary | ICD-10-CM

## 2019-10-15 DIAGNOSIS — M48062 Spinal stenosis, lumbar region with neurogenic claudication: Secondary | ICD-10-CM | POA: Insufficient documentation

## 2019-10-15 DIAGNOSIS — M47816 Spondylosis without myelopathy or radiculopathy, lumbar region: Secondary | ICD-10-CM | POA: Insufficient documentation

## 2019-10-15 DIAGNOSIS — M51369 Other intervertebral disc degeneration, lumbar region without mention of lumbar back pain or lower extremity pain: Secondary | ICD-10-CM

## 2019-10-15 DIAGNOSIS — M5416 Radiculopathy, lumbar region: Secondary | ICD-10-CM | POA: Insufficient documentation

## 2019-10-15 DIAGNOSIS — M1711 Unilateral primary osteoarthritis, right knee: Secondary | ICD-10-CM

## 2019-10-15 DIAGNOSIS — M5136 Other intervertebral disc degeneration, lumbar region: Secondary | ICD-10-CM | POA: Insufficient documentation

## 2019-10-15 DIAGNOSIS — M75101 Unspecified rotator cuff tear or rupture of right shoulder, not specified as traumatic: Secondary | ICD-10-CM | POA: Insufficient documentation

## 2019-10-15 DIAGNOSIS — M12811 Other specific arthropathies, not elsewhere classified, right shoulder: Secondary | ICD-10-CM

## 2019-10-15 DIAGNOSIS — G894 Chronic pain syndrome: Secondary | ICD-10-CM | POA: Insufficient documentation

## 2019-10-15 MED ORDER — GABAPENTIN 300 MG PO CAPS: ORAL_CAPSULE | ORAL | 0 refills | Status: DC

## 2019-10-15 MED ORDER — TIZANIDINE HCL 4 MG PO TABS: 4.0000 mg | ORAL_TABLET | Freq: Two times a day (BID) | ORAL | 0 refills | Status: AC | PRN

## 2019-10-15 NOTE — Progress Notes (Signed)
Safety precautions to be maintained throughout the outpatient stay will include: orient to surroundings, keep bed in low position, maintain call bell within reach at all times, provide assistance with transfer out of bed and ambulation.  

## 2019-10-15 NOTE — Progress Notes (Signed)
Patient: Kaylee Warner  Service Category: E/M  Provider: Gillis Santa, MD  DOB: [birth year]  DOS: 10/15/2019  Referring Provider: Sandrea Hughs, NP  MRN: [id number]  Setting: Ambulatory outpatient  PCP: Sandrea Hughs, NP  Type: New Patient  Specialty: Interventional Pain Management    Location: Office  Delivery: Face-to-face     Primary Reason(s) for Visit: Encounter for initial evaluation of one or more chronic problems (new to examiner) potentially causing chronic pain, and posing a threat to normal musculoskeletal function. (Level of risk: High) CC: Shoulder Pain (right), Back Pain (right), Knee Pain (right), and Foot Pain (right)  HPI  Ms. Kaylee Warner is a 74 y.o. year old, female patient, who comes for the first time to our practice referred by Ngetich, Nelda Bucks, NP for our initial evaluation of her chronic pain. She has DEEP VENOUS THROMBOPHLEBITIS; LOWER LEG, ARTHRITIS, DEGEN./OSTEO; DERANGEMENT OF ANTERIOR HORN OF LATERAL MENISCUS; DERANGEMENT MENISCUS; JOINT EFFUSION, RIGHT KNEE; SHOULDER PAIN; KNEE PAIN; SPINAL STENOSIS; Lumbar pain with radiation down right leg; SCIATICA; IMPINGEMENT SYNDROME; SPONDYLOLYSIS; Rotator cuff syndrome of right shoulder; Mononeuritis lower limb; Knee pain; Hematoma of leg; Chronic pain syndrome; Edema; PTTD (posterior tibial tendon dysfunction); Primary osteoarthritis of right knee; Essential hypertension; Seasonal allergic rhinitis due to pollen; Chronic back pain greater than 3 months duration; Fall; High risk medication use; Alopecia; Lip laceration; Depression; 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 Today she comes in for evaluation of her Shoulder Pain (right), Back Pain (right), Knee Pain (right), and Foot Pain  (right)  Pain Assessment: Location: Right Shoulder Radiating: radiates into fingers Onset: More than a month ago Duration: Chronic pain Quality: Numbness, Discomfort Severity: 10-Worst pain ever/10 (subjective, self-reported pain score)  Effect on ADL: limits activities Timing: Intermittent Modifying factors: water BP: (!) 144/95  HR: 77  Onset and Duration: Gradual and Date of onset: 2 months Cause of pain: Unknown Severity: Getting worse Timing: Morning and After a period of immobility Aggravating Factors: Climbing, Kneeling, Prolonged sitting, Prolonged standing, Squatting and Stooping  Alleviating Factors: Cold packs, Hot packs, Walking and water aerobics Associated Problems: Day-time cramps, Night-time cramps and Depression Quality of Pain: Cramping and Dull Previous Examinations or Tests: X-rays Previous Treatments: The patient denies .  Kaylee Warner is a very pleasant 74 year old female who presents with a chief complaint of right shoulder pain due to right shoulder arthropathy, right rotator cuff dysfunction.  She has significant difficulty and pain raising her arm up.  She states that she has had a steroid injection in this arm over 4 years ago.  She also has a history of lumbar spinal stenosis with neurogenic claudication for which she receives lumbar epidural steroid injections every 3 to 4 months with [place] [facility].  Of note, she was receiving hydrocodone 5 mg 3 times daily as needed from the [facility] however her urine toxicology was noted to be positive for [facility] which she was very honest about.  She states that she had friends that came down from [place] and that they smoke marijuana however this is not a regular occurrence.  Her primary pain complaint today is her right shoulder followed by her low back, right hip and right leg pain.  Of note she has a history of right knee arthroscopic surgery for meniscus repair.  She  also has a history of right plantar  fasciitis for which she receives right steroid injections.  Historic Controlled Substance Pharmacotherapy Review  Historical Monitoring: The patient  reports no history of drug use. List of all UDS Test(s): No results found for: MDMA, COCAINSCRNUR, [place], [place], CANNABQUANT, THCU, [place] List of other Serum/Urine Drug Screening Test(s):  No results found for: AMPHSCRSER, BARBSCRSER, BENZOSCRSER, COCAINSCRSER, COCAINSCRNUR, PCPSCRSER, PCPQUANT, THCSCRSER, THCU, CANNABQUANT, OPIATESCRSER, OXYSCRSER, PROPOXSCRSER, ETH Historical Background Evaluation: [place] PMP: PDMP reviewed during this encounter. Online review of the past 51-monthperiod conducted.             [place] Department of public safety, offender search: (Editor, commissioningInformation) Non-contributory Risk Assessment Profile: Aberrant behavior: None observed or detected today Risk factors for fatal opioid overdose: Prior marijuana use Fatal overdose hazard ratio (HR): Calculation deferred Non-fatal overdose hazard ratio (HR): Calculation deferred Risk of opioid abuse or dependence: 0.7-3.0% with doses ? 36 MME/day and 6.1-26% with doses ? 120 MME/day. Substance use disorder (SUD) risk level: See below Personal History of Substance Abuse (SUD-Substance use disorder):  Alcohol: Negative  Illegal Drugs: Negative  Rx Drugs: Negative  ORT Risk Level calculation: Low Risk  Opioid Risk Tool - 10/15/19 1355      Family History of Substance Abuse   Alcohol Negative    Illegal Drugs Negative    Rx Drugs Negative      Personal History of Substance Abuse   Alcohol Negative    Illegal Drugs Negative    Rx Drugs Negative      Age   Age between 138-45years  No      History of Preadolescent Sexual Abuse   History of Preadolescent Sexual Abuse Negative or Female      Psychological Disease   Psychological Disease Negative    Depression Negative      Total Score   Opioid Risk Tool Scoring 0    Opioid Risk Interpretation Low Risk           ORT Scoring interpretation table:  Score <3 = Low Risk for SUD  Score between 4-7 = Moderate Risk for SUD  Score >8 = High Risk for Opioid Abuse   PHQ-2 Depression Scale:  Total score: 0  PHQ-2 Scoring interpretation table: (Score and probability of major depressive disorder)  Score 0 = No depression  Score 1 = 15.4% Probability  Score 2 = 21.1% Probability  Score 3 = 38.4% Probability  Score 4 = 45.5% Probability  Score 5 = 56.4% Probability  Score 6 = 78.6% Probability   PHQ-9 Depression Scale:  Total score: 0  PHQ-9 Scoring interpretation table:  Score 0-4 = No depression  Score 5-9 = Mild depression  Score 10-14 = Moderate depression  Score 15-19 = Moderately severe depression  Score 20-27 = Severe depression (2.4 times higher risk of SUD and 2.89 times higher risk of overuse)   Pharmacologic Plan: Non-opioid analgesic therapy offered.            Initial impression: Pending review of available data and ordered tests.  Meds   Current Outpatient Medications:  .  amLODipine (NORVASC) 10 MG tablet, Take 1/2 (one-half) tablet by mouth once daily, Disp: 135 tablet, Rfl: 1 .  aspirin EC 81 MG tablet, Take 1 tablet (81 mg total) by mouth daily., Disp: 30 tablet, Rfl: 3 .  Cholecalciferol (VITAMIN D3) 125 MCG (5000 UT) CAPS, Take 1 capsule (5,000 Units total) by mouth every morning., Disp: 30 capsule, Rfl: 0 .  fluticasone (FLONASE) 50 MCG/ACT nasal spray, Place 2 sprays into both nostrils daily., Disp: 16 g, Rfl: 6 .  lisinopril (ZESTRIL) 40 MG tablet, Take 1 tablet (40 mg total) by mouth daily., Disp: 90 tablet, Rfl: 1 .  melatonin 3 MG TABS tablet, Take 3 mg by mouth as needed., Disp: , Rfl:  .  meloxicam (MOBIC) 15 MG tablet, Take 1 tablet (15 mg total) by mouth daily., Disp: 30 tablet, Rfl: 1 .  PARoxetine (PAXIL) 20 MG tablet, Take 1 tablet (20 mg total) by mouth daily., Disp: 90 tablet, Rfl: 1 .  gabapentin (NEURONTIN) 300 MG capsule, Take 1 capsule (300 mg total) by  mouth at bedtime for 15 days, THEN 1 capsule (300 mg total) 2 (two) times daily for 15 days., Disp: 45 capsule, Rfl: 0 .  tiZANidine (ZANAFLEX) 4 MG tablet, Take 1 tablet (4 mg total) by mouth 2 (two) times daily as needed for muscle spasms., Disp: 60 tablet, Rfl: 0  Imaging Review  MR Shoulder Right Wo Contrast  Narrative CLINICAL DATA:  Right shoulder pain for the past 2 months.  EXAM: MRI OF THE RIGHT SHOULDER WITHOUT CONTRAST  TECHNIQUE: Multiplanar, multisequence MR imaging of the shoulder was performed. No intravenous contrast was administered.  COMPARISON:  Right shoulder x-rays dated November 06, 2017.  FINDINGS: Rotator cuff: Full-thickness, full width tear of the supraspinatus tendon with 3.5 cm retraction to the medial humeral head. Full-thickness, near full width tear of the infraspinatus tendon with 3.5 cm retraction to the medial humeral head. A few posterior infraspinatus tendon fibers may remain intact. Small partial-thickness bursal surface tear of the distal subscapularis tendon. The teres minor tendon is intact.  Muscles: Mild-to-moderate supraspinatus and infraspinatus muscle atrophy. Mild subscapularis muscle fatty infiltration. Mild supraspinatus and infraspinatus muscle edema.  Biceps long head: Possible split tear of the intra-articular portion. Normally positioned.  Acromioclavicular Joint: Mild arthropathy of the acromioclavicular joint. Type I, laterally downsloping acromion. Large subacromial/subdeltoid bursal fluid extending into the subcoracoid bursa, with synovitis.  Glenohumeral Joint: Moderate joint effusion. Moderate degenerative changes of the glenohumeral joint with areas of full-thickness cartilage loss over the humeral head.  Labrum:  Circumferentially degenerated.  Bones: No acute fracture or dislocation. Subchondral marrow edema in the posterior humeral head. No focal bone lesion.  Other: None.  IMPRESSION: 1. Full-thickness,  full width tear of the supraspinatus tendon with mild to moderate muscle atrophy. 2. Full-thickness, near full width tear of the infraspinatus tendon with mild to moderate muscle atrophy. 3. Small partial thickness bursal surface tear of the distal subscapularis tendon. 4. Possible split tear of the intra-articular biceps tendon. 5. 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 6. Prominent subacromial/subdeltoid bursitis.   Electronically Signed By: Titus Dubin M.D. On: 11/10/2017 12:44 DG Shoulder Right  Narrative Radiology report [facility]  X-ray to views right shoulder  Right shoulder pain  X-ray shows joint space narrowing superiorly osteophytes inferiorly both sides glenohumeral joint  The acromiohumeral distance has been diminished there are significant spurs of the greater tuberosity with cyst formation sclerosis and this is matched on the acromial side with sclerosis as well.  The acromioclavicular joint space is narrow marginal osteophytes are noted there seem to be chronic  On the lateral x-ray we see a type I acromion with acromiohumeral impingement at the greater tuberosity  Impression bone changes consistent with chronic rotator cuff disease there is also glenohumeral arthritis Foot Imaging: Foot-R DG Complete: Results for orders placed during the hospital encounter of 09/10/19  DG Foot Complete Right  Narrative CLINICAL DATA:  Heel pain for 6 days.  EXAM: RIGHT FOOT COMPLETE - 3+ VIEW  COMPARISON:  None.  FINDINGS: There is no evidence of fracture or dislocation. Moderate degenerative changes are seen at the first metacarpal phalangeal joint. A plantar calcaneal enthesophyte is noted. Soft tissues are unremarkable.  IMPRESSION: 1. Calcaneal enthesophyte. 2. Moderate degenerative changes at the first metatarsophalangeal joint. 3. No acute osseous abnormality.   Electronically Signed By: Zerita Boers M.D. On:  09/10/2019 18:34    Complexity Note: Imaging results reviewed. Results shared with Ms. Reichl, using Layman's terms.                         ROS  Cardiovascular: High blood pressure Pulmonary or Respiratory: Snoring  Neurological: No reported neurological signs or symptoms such as seizures, abnormal skin sensations, urinary and/or fecal incontinence, being born with an abnormal open spine and/or a tethered spinal cord Psychological-Psychiatric: Anxiousness and Depressed Gastrointestinal: No reported gastrointestinal signs or symptoms such as vomiting or evacuating blood, reflux, heartburn, alternating episodes of diarrhea and constipation, inflamed or scarred liver, or pancreas or irrregular and/or infrequent bowel movements Genitourinary: No reported renal or genitourinary signs or symptoms such as difficulty voiding or producing urine, peeing blood, non-functioning kidney, kidney stones, difficulty emptying the bladder, difficulty controlling the flow of urine, or chronic kidney disease Hematological: Weakness due to low blood hemoglobin or red blood cell count (Anemia) Endocrine: No reported endocrine signs or symptoms such as high or low blood sugar, rapid heart rate due to high thyroid levels, obesity or weight gain due to slow thyroid or thyroid disease Rheumatologic: Joint aches and or swelling due to excess weight (Osteoarthritis) Musculoskeletal: Negative for myasthenia gravis, muscular dystrophy, multiple sclerosis or malignant hyperthermia Work History: Retired  Allergies  Ms. Yoshida is allergic to celecoxib, codeine, crestor [rosuvastatin calcium], fexofenadine, and penicillins.  Laboratory Chemistry Profile   Renal Lab Results  Component Value Date   BUN 14 05/08/2019   CREATININE 0.56 (L) 05/08/2019   BCR 25 (H) 05/08/2019   GFRAA 106 05/08/2019   GFRNONAA 92 05/08/2019   PROTEINUR NEGATIVE 04/09/2014     Electrolytes Lab Results  Component Value Date   NA 142  05/08/2019   K 4.3 05/08/2019   CL 109 05/08/2019   CALCIUM 9.2 05/08/2019     Hepatic Lab Results  Component Value Date   AST 13 05/08/2019   ALT 10 05/08/2019   ALBUMIN 4.0 12/16/2015   ALKPHOS 53 12/16/2015     ID Lab Results  Component Value Date   SARSCOV2NAA Detected (A) 02/06/2019     Bone No results found for: VD25OH, [id number], RN1657XU3, YB3383AN1, 25OHVITD1, 25OHVITD2, 25OHVITD3, TESTOFREE, TESTOSTERONE   Endocrine Lab Results  Component Value Date   GLUCOSE 106 (H) 05/08/2019   GLUCOSEU NEGATIVE 04/09/2014   TSH 1.88 05/08/2019     Neuropathy No results found for: VITAMINB12, FOLATE, HGBA1C, HIV   CNS No results found for: COLORCSF, APPEARCSF, RBCCOUNTCSF, WBCCSF, POLYSCSF, LYMPHSCSF, EOSCSF, PROTEINCSF, GLUCCSF, JCVIRUS, CSFOLI, IGGCSF, LABACHR, ACETBL, LABACHR, ACETBL   Inflammation (CRP: Acute  ESR: Chronic) No results found for: CRP, ESRSEDRATE, LATICACIDVEN   Rheumatology No results found for: RF, ANA, LABURIC, URICUR, LYMEIGGIGMAB, LYMEABIGMQN, HLAB27   Coagulation Lab Results  Component Value Date   PLT 353 05/08/2019     Cardiovascular Lab Results  Component Value Date   HGB 12.3 05/08/2019   HCT 39.3 05/08/2019  Screening Lab Results  Component Value Date   SARSCOV2NAA Detected (A) 02/06/2019     Cancer No results found for: CEA, CA125, LABCA2   Allergens No results found for: ALMOND, APPLE, ASPARAGUS, AVOCADO, BANANA, BARLEY, BASIL, BAYLEAF, GREENBEAN, LIMABEAN, WHITEBEAN, BEEFIGE, REDBEET, BLUEBERRY, BROCCOLI, CABBAGE, MELON, CARROT, CASEIN, CASHEWNUT, CAULIFLOWER, CELERY     Note: Lab results reviewed.  [place]  Drug: Ms. Vandevander  reports no history of drug use. Alcohol:  reports current alcohol use of about 7.0 standard drinks of alcohol per week. Tobacco:  reports that she has never smoked. She has never used smokeless tobacco. Medical:  has a past medical history of Allergy, Anemia, Arthritis, and Hypertension. Family:  family history includes Arthritis in an other family member; Diabetes in her sister, son, son, and another family member; Hypertension in her son; Sjogren's syndrome in her daughter.  Past Surgical History:  Procedure Laterality Date  . COLONOSCOPY    . JOINT REPLACEMENT  2008  . SARK 2009     Dr. Jolee Ewing    Active Ambulatory Problems    Diagnosis Date Noted  . DEEP VENOUS THROMBOPHLEBITIS 09/24/2007  . LOWER LEG, ARTHRITIS, DEGEN./OSTEO 05/24/2007  . DERANGEMENT OF ANTERIOR HORN OF LATERAL MENISCUS 06/26/2007  . DERANGEMENT MENISCUS 05/24/2007  . JOINT EFFUSION, RIGHT KNEE 05/24/2007  . SHOULDER PAIN 11/28/2007  . KNEE PAIN 05/24/2007  . SPINAL STENOSIS 01/30/2008  . Lumbar pain with radiation down right leg 01/30/2008  . SCIATICA 01/30/2008  . IMPINGEMENT SYNDROME 11/28/2007  . SPONDYLOLYSIS 01/30/2008  . Rotator cuff syndrome of right shoulder 06/22/2010  . 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 Knee pain 03/02/2011  . Hematoma of leg 10/04/2012  . Chronic pain syndrome 10/04/2012  . Edema 05/09/2013  . PTTD (posterior tibial tendon dysfunction) 05/09/2013  . Primary osteoarthritis of right knee 05/09/2013  . Essential hypertension 09/07/2015  . Seasonal allergic rhinitis due to pollen 08/30/2016  . Chronic back pain greater than 3 months duration 01/21/2017  . Fall 01/21/2017  . High risk medication use 01/21/2017  . Alopecia 01/21/2017  . Lip laceration 01/21/2017  . Depression 11/29/2017  . 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 Skin tag 04/30/2019  . Right rotator cuff tear arthropathy 10/15/2019  . Spinal stenosis, lumbar region, 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  . Lumbar facet arthropathy 10/15/2019  . Lumbar degenerative disc disease 10/15/2019  . Chronic radicular lumbar pain 10/15/2019   Resolved Ambulatory Problems    Diagnosis Date Noted  . No Resolved Ambulatory Problems   Past Medical History:  Diagnosis  Date  . Allergy   . Anemia   . Arthritis   . Hypertension    Constitutional Exam  General appearance: Well nourished, well developed, and well hydrated. In no apparent acute distress Vitals:   10/15/19 1346  BP: (!) 144/95  Pulse: 77  Resp: 18  Temp: (!) 97.5 F (36.4 C)  SpO2: 100%  Weight: 175 lb (79.4 kg)  Height: '5\' 9"'  (1.753 m)   BMI Assessment: Estimated body mass index is 25.84 kg/m as calculated from the following:   Height as of this encounter: '5\' 9"'  (1.753 m).   Weight as of this encounter: 175 lb (79.4 kg).  BMI interpretation table: BMI level Category Range association with higher incidence of chronic pain  <18 kg/m2 Underweight   18.5-24.9 kg/m2 Ideal body weight   25-29.9 kg/m2 Overweight Increased incidence by 20%  30-34.9 kg/m2 Obese (Class I) Increased incidence by 68%  35-39.9  kg/m2 Severe obesity (Class II) Increased incidence by 136%  >40 kg/m2 Extreme obesity (Class III) Increased incidence by 254%   Patient's current BMI Ideal Body weight  Body mass index is 25.84 kg/m. Ideal body weight: 66.2 kg (145 lb 15.1 oz) Adjusted ideal body weight: 71.5 kg (157 lb 9.1 oz)   BMI Readings from Last 4 Encounters:  10/15/19 25.84 kg/m  09/10/19 26.43 kg/m  05/16/19 26.82 kg/m  04/29/19 26.58 kg/m   Wt Readings from Last 4 Encounters:  10/15/19 175 lb (79.4 kg)  09/10/19 179 lb (81.2 kg)  05/16/19 181 lb 9.6 oz (82.4 kg)  04/29/19 180 lb (81.6 kg)    Psych/Mental status: Alert, oriented x 3 (person, place, & time)       Eyes: PERLA Respiratory: No evidence of acute respiratory distress  Cervical Spine Exam  Skin & Axial Inspection: No masses, redness, edema, swelling, or associated skin lesions Alignment: Symmetrical Functional ROM: Unrestricted ROM      Stability: No instability detected Muscle Tone/Strength: Functionally intact. No obvious neuro-muscular anomalies detected. Sensory (Neurological): Unimpaired Palpation: No palpable anomalies               Upper Extremity (UE) Exam    Side: Right upper extremity  Side: Left upper extremity  Skin & Extremity Inspection: Skin color, temperature, and hair growth are WNL. No peripheral edema or cyanosis. No masses, redness, swelling, asymmetry, or associated skin lesions. No contractures.  Skin & Extremity Inspection: Skin color, temperature, and hair growth are WNL. No peripheral edema or cyanosis. No masses, redness, swelling, asymmetry, or associated skin lesions. No contractures.  Functional ROM: Diminished ROM for shoulder  Functional ROM: Unrestricted ROM          Muscle Tone/Strength: Movement possible against some resistance (4/5) right shoulder   Muscle Tone/Strength: Functionally intact. No obvious neuro-muscular anomalies detected.  Sensory (Neurological): Arthropathic arthralgia          Sensory (Neurological): Unimpaired          Palpation: No palpable anomalies              Palpation: No palpable anomalies              Provocative Test(s):  Phalen's test: deferred Tinel's test: deferred Apley's scratch test (touch opposite shoulder):  Action 1 (Across chest): Decreased ROM Action 2 (Overhead): Decreased ROM Action 3 (LB reach): Decreased ROM   Provocative Test(s):  Phalen's test: deferred Tinel's test: deferred Apley's scratch test (touch opposite shoulder):  Action 1 (Across chest): deferred Action 2 (Overhead): deferred Action 3 (LB reach): deferred    Thoracic Spine Area Exam  Skin & Axial Inspection: No masses, redness, or swelling Alignment: Symmetrical Functional ROM: Unrestricted ROM Stability: No instability detected Muscle Tone/Strength: Functionally intact. No obvious neuro-muscular anomalies detected. Sensory (Neurological): Unimpaired Muscle strength & Tone: No palpable anomalies  Lumbar Exam  Skin & Axial Inspection: No masses, redness, or swelling Alignment: Symmetrical Functional ROM: Pain restricted ROM affecting both sides Stability: No  instability detected Muscle Tone/Strength: Functionally intact. No obvious neuro-muscular anomalies detected. Sensory (Neurological): Musculoskeletal pain pattern Palpation: No palpable anomalies       Provocative Tests: Hyperextension/rotation test: (+) bilaterally for facet joint pain. Lumbar quadrant test (Kemp's test): (+) bilateral for foraminal stenosis Lateral bending test: deferred today       Patrick's Maneuver: deferred today                   FABER* test: deferred today  S-I anterior distraction/compression test: deferred today         S-I lateral compression test: deferred today         S-I Thigh-thrust test: deferred today         S-I Gaenslen's test: deferred today         *(Flexion, ABduction and External Rotation)  Gait & Posture Assessment  Ambulation: Limited Gait: Antalgic Posture: Difficulty standing up straight, due to pain   Lower Extremity Exam    Side: Right lower extremity  Side: Left lower extremity  Stability: No instability observed          Stability: No instability observed          Skin & Extremity Inspection: Evidence of prior arthroplastic surgery right knee  Skin & Extremity Inspection: Skin color, temperature, and hair growth are WNL. No peripheral edema or cyanosis. No masses, redness, swelling, asymmetry, or associated skin lesions. No contractures.  Functional ROM: Pain restricted ROM for hip and knee joints          Functional ROM: Unrestricted ROM                  Muscle Tone/Strength: Functionally intact. No obvious neuro-muscular anomalies detected.  Muscle Tone/Strength: Functionally intact. No obvious neuro-muscular anomalies detected.  Sensory (Neurological): Arthropathic arthralgia        Sensory (Neurological): Unimpaired        DTR: Patellar: deferred today Achilles: deferred today Plantar: deferred today  DTR: Patellar: deferred today Achilles: deferred today Plantar: deferred today  Palpation: No palpable  anomalies  Palpation: No palpable anomalies   Assessment  Primary Diagnosis & Pertinent Problem List: The primary encounter diagnosis was Arthropathy of right shoulder. Diagnoses of Right rotator cuff tear arthropathy, Spinal stenosis, lumbar region, with neurogenic claudication, Primary osteoarthritis of right knee (hx of right knee arthroscopic surgery), Lumbar spondylosis, Lumbar degenerative disc disease, Lumbar facet arthropathy, Chronic radicular lumbar pain, and Chronic pain syndrome were also pertinent to this visit.  Visit Diagnosis (New problems to examiner): 1. Arthropathy of right shoulder   2. Right rotator cuff tear arthropathy   3. Spinal stenosis, lumbar region, with neurogenic claudication   4. Primary osteoarthritis of right knee (hx of right knee arthroscopic surgery)   5. Lumbar spondylosis   6. Lumbar degenerative disc disease   7. Lumbar facet arthropathy   8. Chronic radicular lumbar pain   9. Chronic pain syndrome    Plan of Care (Initial workup plan)  Note: Ms. Schwalbe was reminded that as per protocol, today's visit has been an evaluation only. We have not taken over the patient's controlled substance management. General Recommendations: The pain condition that the patient suffers from is best treated with a multidisciplinary approach that involves an increase in physical activity to prevent de-conditioning and worsening of the pain cycle, as well as psychological counseling (formal and/or informal) to address the co-morbid psychological affects of pain. Treatment will often involve judicious use of pain medications and interventional procedures to decrease the pain, allowing the patient to participate in the physical activity that will ultimately produce long-lasting pain reductions. The goal of the multidisciplinary approach is to return the patient to a higher level of overall function and to restore their ability to perform activities of daily living.    Lab  Orders     Compliance Drug Analysis, Ur  Imaging Orders     DG Shoulder Right     DG Lumbar Spine Complete W/Bend  Procedure Orders  SHOULDER INJECTION Pharmacotherapy (current): Medications ordered:  Meds ordered this encounter  Medications  . gabapentin (NEURONTIN) 300 MG capsule    Sig: Take 1 capsule (300 mg total) by mouth at bedtime for 15 days, THEN 1 capsule (300 mg total) 2 (two) times daily for 15 days.    Dispense:  45 capsule    Refill:  0    Fill one day early if pharmacy is closed on scheduled refill date. May substitute for generic if available.  Marland Kitchen tiZANidine (ZANAFLEX) 4 MG tablet    Sig: Take 1 tablet (4 mg total) by mouth 2 (two) times daily as needed for muscle spasms.    Dispense:  60 tablet    Refill:  0    Do not place this medication, or any other prescription from our practice, on "Automatic Refill". Patient may have prescription filled one day early if pharmacy is closed on scheduled refill date.   Medications administered during this visit: Zaylee C. Sine "Kitty" had no medications administered during this visit.   We will obtain records from [facility] regarding patient's previous injection history as well as her podiatrist.  Pharmacological management options:  Opioid Analgesics: The patient was informed that there is no guarantee that she would be a candidate for opioid analgesics. The decision will be made following CDC guidelines. This decision will be based on the results of diagnostic studies, as well as Ms. Chevez's risk profile.  Future considerations could include buprenorphine, tramadol.  Membrane stabilizer: Trial of gabapentin as above.  Future considerations could include Lyrica, Cymbalta  Muscle relaxant: Trial of tizanidine as above.  Future considerations include Robaxin  NSAID: To be determined at a later time  Other analgesic(s): To be determined at a later time   Interventional management options: Ms. Tat  was informed that there is no guarantee that she would be a candidate for interventional therapies. The decision will be based on the results of diagnostic studies, as well as Ms. Nies's risk profile.  Procedure(s) under consideration:  Right glenohumeral joint injection Right suprascapular nerve block Right Sprint peripheral nerve stimulation of axillary nerve Lumbar epidural steroid injection   Provider-requested follow-up: Return in about 1 week (around 10/22/2019) for right shoulder injection, without sedation.  Future Appointments  Date Time Provider [place]  10/28/2019 12:40 PM Gillis Santa, MD [facility] None  10/30/2019  8:00 AM [facility] None  11/01/2019  2:45 PM Ngetich, Nelda Bucks, NP [facility] None  11/06/2019 11:15 AM Felipa Furnace, DPM [facility]  01/08/2020  2:30 PM [facility] DX DEXA 1 [facility]  01/08/2020  3:00 PM [facility] MM 3 [facility]  07/15/2020  1:00 PM Eubanks, Carlos American, NP [facility] None    Note by: Gillis Santa, MD Date: 10/15/2019; Time: 3:39 PM

## 2019-10-15 NOTE — Patient Instructions (Signed)
1. Please get records from Dr Orpah Melter [facility] ) and Kaylee Warner, please give to me and scan in  2. Follow up in 2 weeks for right shoulder injection

## 2019-10-17 ENCOUNTER — Other Ambulatory Visit: Payer: Self-pay

## 2019-10-17 DIAGNOSIS — E782 Mixed hyperlipidemia: Secondary | ICD-10-CM

## 2019-10-18 LAB — COMPLIANCE DRUG ANALYSIS, UR

## 2019-10-28 ENCOUNTER — Encounter: Payer: Self-pay | Admitting: Student in an Organized Health Care Education/Training Program

## 2019-10-28 ENCOUNTER — Ambulatory Visit
Admission: RE | Admit: 2019-10-28 | Discharge: 2019-10-28 | Disposition: A | Discharge status: Home / Self Care | Payer: Medicare Other | Source: Ambulatory Visit | Attending: Student in an Organized Health Care Education/Training Program | Admitting: Student in an Organized Health Care Education/Training Program

## 2019-10-28 ENCOUNTER — Ambulatory Visit (HOSPITAL_BASED_OUTPATIENT_CLINIC_OR_DEPARTMENT_OTHER): Payer: Medicare Other | Admitting: Student in an Organized Health Care Education/Training Program

## 2019-10-28 ENCOUNTER — Other Ambulatory Visit: Payer: Self-pay

## 2019-10-28 ENCOUNTER — Ambulatory Visit: Payer: Medicare Other | Admitting: Student in an Organized Health Care Education/Training Program

## 2019-10-28 VITALS — BP 136/81 | HR 71 | Temp 97.2°F | Resp 16 | Ht 69.0 in | Wt 175.0 lb

## 2019-10-28 DIAGNOSIS — M19011 Primary osteoarthritis, right shoulder: Secondary | ICD-10-CM

## 2019-10-28 DIAGNOSIS — G894 Chronic pain syndrome: Secondary | ICD-10-CM | POA: Diagnosis not present

## 2019-10-28 DIAGNOSIS — M75101 Unspecified rotator cuff tear or rupture of right shoulder, not specified as traumatic: Secondary | ICD-10-CM | POA: Diagnosis not present

## 2019-10-28 DIAGNOSIS — M12811 Other specific arthropathies, not elsewhere classified, right shoulder: Secondary | ICD-10-CM | POA: Diagnosis present

## 2019-10-28 MED ORDER — METHYLPREDNISOLONE ACETATE 40 MG/ML IJ SUSP
40.0000 mg | Freq: Once | INTRAMUSCULAR | Status: AC
  Administered 2019-10-28: 40 mg via INTRA_ARTICULAR
  Filled 2019-10-28: qty 1

## 2019-10-28 MED ORDER — ROPIVACAINE HCL 2 MG/ML IJ SOLN
4.0000 mL | Freq: Once | INTRAMUSCULAR | Status: AC
  Administered 2019-10-28: 4 mL via INTRA_ARTICULAR
  Filled 2019-10-28: qty 10

## 2019-10-28 MED ORDER — IOHEXOL 180 MG/ML  SOLN
10.0000 mL | Freq: Once | INTRAMUSCULAR | Status: AC
  Administered 2019-10-28: 10 mL via INTRA_ARTICULAR
  Filled 2019-10-28: qty 20

## 2019-10-28 MED ORDER — LIDOCAINE HCL 2 % IJ SOLN
20.0000 mL | Freq: Once | INTRAMUSCULAR | Status: AC
  Administered 2019-10-28: 200 mg
  Filled 2019-10-28: qty 10

## 2019-10-28 NOTE — Progress Notes (Signed)
Safety precautions to be maintained throughout the outpatient stay will include: orient to surroundings, keep bed in low position, maintain call bell within reach at all times, provide assistance with transfer out of bed and ambulation.  

## 2019-10-28 NOTE — Progress Notes (Signed)
PROVIDER NOTE: Information contained herein reflects review and annotations entered in association with encounter. Interpretation of such information and data should be left to medically-trained personnel. Information provided to patient can be located elsewhere in the medical record under "Patient Instructions". Document created using STT-dictation technology, any transcriptional errors that may result from process are unintentional.    Patient: Kaylee Warner  Service Category: Procedure  Provider: Gillis Santa, MD  DOB: [birth year]  DOS: 10/28/2019  Location: [facility]  MRN: [id number]  Setting: Ambulatory - outpatient  Referring Provider: Sandrea Hughs, NP  Type: Established Patient  Specialty: Interventional Pain Management  PCP: Sandrea Hughs, NP   Primary Reason for Visit: Interventional Pain Management Treatment. CC: Shoulder Pain (right)  Procedure:          Anesthesia, Analgesia, Anxiolysis:  Type: Diagnostic Glenohumeral Joint (shoulder) Injection #1  Primary Purpose: Diagnostic Region: Posterior Shoulder Area Level:  Shoulder Target Area: Glenohumeral Joint (shoulder) Approach: Posterior approach. Laterality: Right-Sided  Type: Local Anesthesia  Local Anesthetic: Lidocaine 1-2%  Position: Supine   Indications: 1. Arthropathy of right shoulder   2. Right rotator cuff tear arthropathy   3. Chronic pain syndrome    Pain Score: Pre-procedure: 8 /10 Post-procedure: 0-No pain (able to move arm well)/10   Pre-op Assessment:  Kaylee Warner is a 74 y.o. (year old), female patient, seen today for interventional treatment. She  has a past surgical history that includes [place] 2009; Joint replacement (2008); and Colonoscopy. Kaylee Warner has a current medication list which includes the following prescription(s): amlodipine, aspirin ec, vitamin d3, fluticasone, gabapentin, lisinopril, melatonin, meloxicam, paroxetine, and tizanidine. Her primarily concern today is the  Shoulder Pain (right)  Initial Vital Signs:  Pulse/HCG Rate: 70  Temp: (!) 97.2 F (36.2 C) Resp: 16 BP: 126/85 SpO2: 100 %  BMI: Estimated body mass index is 25.84 kg/m as calculated from the following:   Height as of this encounter: 5\' 9"  (1.753 m).   Weight as of this encounter: 175 lb (79.4 kg).  Risk Assessment: Allergies: Reviewed. She is allergic to celecoxib, codeine, crestor [rosuvastatin calcium], fexofenadine, and penicillins.  Allergy Precautions: None required Coagulopathies: Reviewed. None identified.  Blood-thinner therapy: None at this time Active Infection(s): Reviewed. None identified. Kaylee Warner is afebrile  Site Confirmation: Kaylee Warner was asked to confirm the procedure and laterality before marking the site Procedure checklist: Completed Consent: Before the procedure and under the influence of no sedative(s), amnesic(s), or anxiolytics, the patient was informed of the treatment options, risks and possible complications. To fulfill our ethical and legal obligations, as recommended by the American Medical Association's Code of Ethics, I have informed the patient of my clinical impression; the nature and purpose of the treatment or procedure; the risks, benefits, and possible complications of the intervention; the alternatives, including doing nothing; the risk(s) and benefit(s) of the alternative treatment(s) or procedure(s); and the risk(s) and benefit(s) of doing nothing. The patient was provided information about the general risks and possible complications associated with the procedure. These may include, but are not limited to: failure to achieve desired goals, infection, bleeding, organ or nerve damage, allergic reactions, paralysis, and death. In addition, the patient was informed of those risks and complications associated to the procedure, such as failure to decrease pain; infection; bleeding; organ or nerve damage with subsequent damage to sensory, motor, and/or  autonomic systems, resulting in permanent pain, numbness, and/or weakness of one or several areas of the body; allergic reactions; (i.e.:  anaphylactic reaction); and/or death. Furthermore, the patient was informed of those risks and complications associated with the medications. These include, but are not limited to: allergic reactions (i.e.: anaphylactic or anaphylactoid reaction(s)); adrenal axis suppression; blood sugar elevation that in diabetics may result in ketoacidosis or comma; water retention that in patients with history of congestive heart failure may result in shortness of breath, pulmonary edema, and decompensation with resultant heart failure; weight gain; swelling or edema; medication-induced neural toxicity; particulate matter embolism and blood vessel occlusion with resultant organ, and/or nervous system infarction; and/or aseptic necrosis of one or more joints. Finally, the patient was informed that Medicine is not an exact science; therefore, there is also the possibility of unforeseen or unpredictable risks and/or possible complications that may result in a catastrophic outcome. The patient indicated having understood very clearly. We have given the patient no guarantees and we have made no promises. Enough time was given to the patient to ask questions, all of which were answered to the patient's satisfaction. Kaylee Warner has indicated that she wanted to continue with the procedure. Attestation: I, the ordering provider, attest that I have discussed with the patient the benefits, risks, side-effects, alternatives, likelihood of achieving goals, and potential problems during recovery for the procedure that I have provided informed consent. Date  Time: 10/28/2019 12:41 PM  Pre-Procedure Preparation:  Monitoring: As per clinic protocol. Respiration, ETCO2, SpO2, BP, heart rate and rhythm monitor placed and checked for adequate function Safety Precautions: Patient was assessed for positional  comfort and pressure points before starting the procedure. Time-out: I initiated and conducted the "Time-out" before starting the procedure, as per protocol. The patient was asked to participate by confirming the accuracy of the "Time Out" information. Verification of the correct person, site, and procedure were performed and confirmed by me, the nursing staff, and the patient. "Time-out" conducted as per Joint Commission's Universal Protocol (UP.01.01.01). Time: 1315  Description of Procedure:          Area Prepped: Entire shoulder Area DuraPrep (Iodine Povacrylex [0.7% available iodine] and Isopropyl Alcohol, 74% w/w) Safety Precautions: Aspiration looking for blood return was conducted prior to all injections. At no point did we inject any substances, as a needle was being advanced. No attempts were made at seeking any paresthesias. Safe injection practices and needle disposal techniques used. Medications properly checked for expiration dates. SDV (single dose vial) medications used. Description of the Procedure: Protocol guidelines were followed. The patient was placed in position over the procedure table. The target area was identified and the area prepped in the usual manner. Skin & deeper tissues infiltrated with local anesthetic. Appropriate amount of time allowed to pass for local anesthetics to take effect. The procedure needles were then advanced to the target area. Proper needle placement secured. Negative aspiration confirmed. Solution injected in intermittent fashion, asking for systemic symptoms every 0.5cc of injectate. The needles were then removed and the area cleansed, making sure to leave some of the prepping solution back to take advantage of its long term bactericidal properties.         Vitals:   10/28/19 1245 10/28/19 1305 10/28/19 1315 10/28/19 1323  BP: 126/85 (!) 145/87 131/82 136/81  Pulse: 70 72 69 71  Resp: 16 18 17 16   Temp: (!) 97.2 F (36.2 C)     SpO2: 100% 97%  97% 96%  Weight: 175 lb (79.4 kg)     Height: 5\' 9"  (1.753 m)       Start Time:  1315 hrs. End Time: 1319 hrs. Materials:  Needle(s) Type: Spinal Needle Gauge: 25G Length: 3.5-in Medication(s): Please see orders for medications and dosing details. 5 cc solution made of 4 cc of 0.2% ropivacaine, 1 cc of methylprednisolone, 40 mg/cc.  Injected into right glenohumeral joint, posterior approach. Imaging Guidance (Non-Spinal):          Type of Imaging Technique: Fluoroscopy Guidance (Non-Spinal) Indication(s): Assistance in needle guidance and placement for procedures requiring needle placement in or near specific anatomical locations not easily accessible without such assistance. Exposure Time: Please see nurses notes. Contrast: Before injecting any contrast, we confirmed that the patient did not have an allergy to iodine, shellfish, or radiological contrast. Once satisfactory needle placement was completed at the desired level, radiological contrast was injected. Contrast injected under live fluoroscopy. No contrast complications. See chart for type and volume of contrast used. Fluoroscopic Guidance: I was personally present during the use of fluoroscopy. "Tunnel Vision Technique" used to obtain the best possible view of the target area. Parallax error corrected before commencing the procedure. "Direction-depth-direction" technique used to introduce the needle under continuous pulsed fluoroscopy. Once target was reached, antero-posterior, oblique, and lateral fluoroscopic projection used confirm needle placement in all planes. Images permanently stored in EMR. Interpretation: I personally interpreted the imaging intraoperatively. Adequate needle placement confirmed in multiple planes. Appropriate spread of contrast into desired area was observed. No evidence of afferent or efferent intravascular uptake. Permanent images saved into the patient's record.  Antibiotic Prophylaxis:   Anti-infectives  (From admission, onward)   None     Indication(s): None identified  Post-operative Assessment:  Post-procedure Vital Signs:  Pulse/HCG Rate: 71  Temp: (!) 97.2 F (36.2 C) Resp: 16 BP: 136/81 SpO2: 96 %  EBL: None  Complications: No immediate post-treatment complications observed by team, or reported by patient.  Note: The patient tolerated the entire procedure well. A repeat set of vitals were taken after the procedure and the patient was kept under observation following institutional policy, for this type of procedure. Post-procedural neurological assessment was performed, showing return to baseline, prior to discharge. The patient was provided with post-procedure discharge instructions, including a section on how to identify potential problems. Should any problems arise concerning this procedure, the patient was given instructions to immediately contact us, at any time, without hesitation. In any case, we plan to contact the patient by telephone for a follow-up status report regarding this interventional procedure.  Comments:  No additional relevant information.  Plan of Care  Orders:  Orders Placed This Encounter  Procedures  . DG PAIN CLIN[facility] C-ARM 1-60 MIN NO REPORT    Intraoperative interpretation by procedural physician at [place].    Standing Status:   Standing    Number of Occurrences:   1    Order Specific Question:   Reason for exam:    Answer:   Assistance in needle guidance and placement for procedures requiring needle placement in or near specific anatomical locations not easily accessible without such assistance.   Medications ordered for procedure: Meds ordered this encounter  Medications  . iohexol (OMNIPAQUE) 180 MG/ML injection 10 mL    Must be Myelogram-compatible. If not available, you may substitute with a water-soluble, non-ionic, hypoallergenic, myelogram-compatible radiological contrast medium.  Marland Kitchen lidocaine (XYLOCAINE) 2 % (with pres)  injection 400 mg  . ropivacaine (PF) 2 mg/mL (0.2%) (NAROPIN) injection 4 mL  . methylPREDNISolone acetate (DEPO-MEDROL) injection 40 mg   Medications administered: We administered iohexol, lidocaine, ropivacaine (PF)  2 mg/mL (0.2%), and methylPREDNISolone acetate.  See the medical record for exact dosing, route, and time of administration.  Follow-up plan:   Return in about 4 weeks (around 11/25/2019) for Post Procedure Evaluation, in person.      Right posterior glenohumeral joint injection 10/28/2019   Recent Visits Date Type Provider Dept  10/15/19 Office Visit Gillis Santa, MD [facility]  Showing recent visits within past 90 days and meeting all other requirements Today's Visits Date Type Provider Dept  10/28/19 Procedure visit Gillis Santa, MD [facility]  Showing today's visits and meeting all other requirements Future Appointments Date Type Provider Dept  11/25/19 Appointment Gillis Santa, MD [facility]  Showing future appointments within next 90 days and meeting all other requirements  Disposition: Discharge home  Discharge (Date  Time): 10/28/2019; 1326 hrs.   Primary Care Physician: Sandrea Hughs, NP Location: [facility] Note by: Gillis Santa, MD Date: 10/28/2019; Time: 1:57 PM  Disclaimer:  Medicine is not an exact science. The only guarantee in medicine is that nothing is guaranteed. It is important to note that the decision to proceed with this intervention was based on the information collected from the patient. The Data and conclusions were drawn from the patient's questionnaire, the interview, and the physical examination. Because the information was provided in large part by the patient, it cannot be guaranteed that it has not been purposely or unconsciously manipulated. Every effort has been made to obtain as much relevant data as possible for this evaluation. It is important to note that the  conclusions that lead to this procedure are derived in large part from the available data. Always take into account that the treatment will also be dependent on availability of resources and existing treatment guidelines, considered by other Pain Management Practitioners as being common knowledge and practice, at the time of the intervention. For Medico-Legal purposes, it is also important to point out that variation in procedural techniques and pharmacological choices are the acceptable norm. The indications, contraindications, technique, and results of the above procedure should only be interpreted and judged by a Board-Certified Interventional Pain Specialist with extensive familiarity and expertise in the same exact procedure and technique.

## 2019-10-29 ENCOUNTER — Telehealth: Payer: Self-pay | Admitting: *Deleted

## 2019-10-29 NOTE — Telephone Encounter (Signed)
No problems post procedure. 

## 2019-10-30 ENCOUNTER — Other Ambulatory Visit: Payer: Medicare Other

## 2019-10-30 ENCOUNTER — Other Ambulatory Visit: Payer: Self-pay | Admitting: Family

## 2019-10-30 DIAGNOSIS — E782 Mixed hyperlipidemia: Secondary | ICD-10-CM

## 2019-10-30 DIAGNOSIS — I10 Essential (primary) hypertension: Secondary | ICD-10-CM

## 2019-10-31 ENCOUNTER — Other Ambulatory Visit: Payer: Medicare Other

## 2019-10-31 ENCOUNTER — Other Ambulatory Visit: Payer: Self-pay

## 2019-10-31 LAB — LIPID PANEL
Cholesterol: 221 mg/dL — ABNORMAL HIGH (ref <200)
HDL: 70 mg/dL (ref >=50)
LDL Cholesterol (Calc): 135 mg/dL (calc) — ABNORMAL HIGH
Non-HDL Cholesterol (Calc): 151 mg/dL (calc) — ABNORMAL HIGH (ref <130)
Total CHOL/HDL Ratio: 3.2 (calc) (ref <5.0)
Triglycerides: 68 mg/dL (ref <150)

## 2019-11-01 ENCOUNTER — Ambulatory Visit: Payer: Medicare Other | Admitting: Family

## 2019-11-05 ENCOUNTER — Other Ambulatory Visit: Payer: Self-pay

## 2019-11-05 ENCOUNTER — Ambulatory Visit: Payer: Medicare Other | Admitting: Family

## 2019-11-05 DIAGNOSIS — E782 Mixed hyperlipidemia: Secondary | ICD-10-CM

## 2019-11-06 ENCOUNTER — Ambulatory Visit: Payer: Medicare Other | Admitting: Podiatry

## 2019-11-21 ENCOUNTER — Encounter: Payer: Self-pay | Admitting: Student in an Organized Health Care Education/Training Program

## 2019-11-25 ENCOUNTER — Other Ambulatory Visit: Payer: Self-pay

## 2019-11-25 ENCOUNTER — Ambulatory Visit
Payer: Medicare Other | Attending: Student in an Organized Health Care Education/Training Program | Admitting: Student in an Organized Health Care Education/Training Program

## 2019-11-25 DIAGNOSIS — G894 Chronic pain syndrome: Secondary | ICD-10-CM | POA: Diagnosis not present

## 2019-11-25 DIAGNOSIS — M75101 Unspecified rotator cuff tear or rupture of right shoulder, not specified as traumatic: Secondary | ICD-10-CM | POA: Insufficient documentation

## 2019-11-25 DIAGNOSIS — M19011 Primary osteoarthritis, right shoulder: Secondary | ICD-10-CM | POA: Diagnosis present

## 2019-11-25 DIAGNOSIS — M12811 Other specific arthropathies, not elsewhere classified, right shoulder: Secondary | ICD-10-CM

## 2019-11-25 NOTE — Progress Notes (Signed)
Patient: Kaylee Warner  Service Category: E/M  Provider: Gillis Santa, MD  DOB: 05-08-1945  DOS: 11/25/2019  Location: Office  MRN: [id number]  Setting: Ambulatory outpatient  Referring Provider: Sandrea Hughs, NP  Type: Established Patient  Specialty: Interventional Pain Management  PCP: Sandrea Hughs, NP  Location: Home  Delivery: TeleHealth     Virtual Encounter - Pain Management PROVIDER NOTE: Information contained herein reflects review and annotations entered in association with encounter. Interpretation of such information and data should be left to medically-trained personnel. Information provided to patient can be located elsewhere in the medical record under "Patient Instructions". Document created using STT-dictation technology, any transcriptional errors that may result from process are unintentional.    Contact & Pharmacy Preferred: [phone] Home: [phone] (home) Mobile: [phone] (mobile) E-mail: [email or web address] [address], [place] [address] Phone: [phone] Fax: [phone]   Pre-screening  Kaylee Warner offered "in-person" vs "virtual" encounter. She indicated preferring virtual for this encounter.   Reason COVID-19*  Social distancing based on CDC and AMA recommendations.   I contacted Kaylee Warner on 11/25/2019 via video conference.      I clearly identified myself as Gillis Santa, MD. I verified that I was speaking with the correct person using two identifiers (Name: Kaylee Warner, and date of birth: [birth year]).  Consent I sought verbal advanced consent from Kaylee Warner for virtual visit interactions. I informed Kaylee Warner of possible security and privacy concerns, risks, and limitations associated with providing "not-in-person" medical evaluation and management services. I also informed Kaylee Warner of the availability of "in-person" appointments. Finally, I informed her that there  would be a charge for the virtual visit and that she could be  personally, fully or partially, financially responsible for it. Kaylee Warner expressed understanding and agreed to proceed.   Historic Elements   Kaylee Warner is a 74 y.o. year old, female patient evaluated today after our last contact on 10/28/2019. Kaylee Warner  has a past medical history of Allergy, Anemia, Arthritis, and Hypertension. She also  has a past surgical history that includes [place] 2009; Joint replacement (2008); and Colonoscopy. Kaylee Warner has a current medication list which includes the following prescription(s): amlodipine, aspirin ec, vitamin d3, fluticasone, lisinopril, melatonin, meloxicam, paroxetine, and gabapentin. She  reports that she has never smoked. She has never used smokeless tobacco. She reports current alcohol use of about 7.0 standard drinks of alcohol per week. She reports that she does not use drugs. Kaylee Warner is allergic to celecoxib, codeine, crestor [rosuvastatin calcium], fexofenadine, and penicillins.   HPI  Today, she is being contacted for a post-procedure assessment.   Post-Procedure Evaluation  Procedure (10/28/2019): Type: Diagnostic Glenohumeral Joint (shoulder) Injection #1  Primary Purpose: Diagnostic Region: Posterior Shoulder Area Level:  Shoulder Target Area: Glenohumeral Joint (shoulder) Approach: Posterior approach. Laterality: Right-Sided  Sedation: Please see nurses note.  Effectiveness during initial hour after procedure(Ultra-Short Term Relief): 100 %  Local anesthetic used: Long-acting (4-6 hours) Effectiveness: Defined as any analgesic benefit obtained secondary to the administration of local anesthetics. This carries significant diagnostic value as to the etiological location, or anatomical origin, of the pain. Duration of benefit is expected to coincide with the duration of the local anesthetic used.  Effectiveness during initial 4-6 hours after procedure(Short-Term  Relief): 100 %   Long-term benefit: Defined as any relief past the pharmacologic duration of the local anesthetics.  Effectiveness past the initial 6 hours after procedure(Long-Term Relief): 90 %  Current benefits: Defined as benefit that persist at this time.   Analgesia:  90-100% better Function: Kaylee Warner reports improvement in function ROM: Kaylee Warner reports improvement in ROM    Laboratory Chemistry Profile   Renal Lab Results  Component Value Date   BUN 14 05/08/2019   CREATININE 0.56 (L) 05/08/2019   BCR 25 (H) 05/08/2019   GFRAA 106 05/08/2019   GFRNONAA 92 05/08/2019     Hepatic Lab Results  Component Value Date   AST 13 05/08/2019   ALT 10 05/08/2019   ALBUMIN 4.0 12/16/2015   ALKPHOS 53 12/16/2015     Electrolytes Lab Results  Component Value Date   NA 142 05/08/2019   K 4.3 05/08/2019   CL 109 05/08/2019   CALCIUM 9.2 05/08/2019     Bone No results found for: VD25OH, VD125OH2TOT, SW5462VO3, JK0938HW2, 25OHVITD1, 25OHVITD2, 25OHVITD3, TESTOFREE, TESTOSTERONE   Inflammation (CRP: Acute Phase) (ESR: Chronic Phase) No results found for: CRP, ESRSEDRATE, LATICACIDVEN     Note: Above Lab results reviewed.   Assessment  The primary encounter diagnosis was Arthropathy of right shoulder. Diagnoses of Right rotator cuff tear arthropathy and Chronic pain syndrome were also pertinent to this visit.  Plan of Care   Significant pain relief and improvement in range of motion after right posterior glenohumeral joint injection. Patient states that she can sleep at night without waking up from shoulder pain. She is able to complete more ADLs with less pain. I am very pleased with her results. I have instructed her to follow-up as needed if she does have return of her right shoulder pain. As needed order placed for repeat right shoulder injection.  Orders:  Orders Placed This Encounter  Procedures  . SHOULDER INJECTION    For shoulder pain.    Standing Status:    Standing    Number of Occurrences:   1    Standing Expiration Date:   11/24/2020    Scheduling Instructions:     Side: RIGHT     Sedation: Patient's choice.     TIMEFRAME: PRN procedure. (Kaylee Warner will call when needed.)    Order Specific Question:   Where will this procedure be performed?    Answer:   [facility]    Comments:      Follow-up plan:   Return if symptoms worsen or fail to improve.     Right posterior glenohumeral joint injection 10/28/2019: 90% pain relief, repeat as needed    Recent Visits Date Type Provider Dept  10/28/19 Procedure visit Gillis Santa, MD [facility]  10/15/19 Office Visit Gillis Santa, MD [facility]  Showing recent visits within past 90 days and meeting all other requirements Today's Visits Date Type Provider Dept  11/25/19 Telemedicine Gillis Santa, MD [facility]  Showing today's visits and meeting all other requirements Future Appointments No visits were found meeting these conditions. Showing future appointments within next 90 days and meeting all other requirements  I discussed the assessment and treatment plan with the patient. The patient was provided an opportunity to ask questions and all were answered. The patient agreed with the plan and demonstrated an understanding of the instructions.  Patient advised to call back or seek an in-person evaluation if the symptoms or condition worsens.  Duration of encounter: 15 minutes.  Note by: Gillis Santa, MD Date: 11/25/2019; Time: 3:10 PM

## 2019-12-03 ENCOUNTER — Telehealth: Payer: Self-pay | Admitting: Student in an Organized Health Care Education/Training Program

## 2019-12-03 NOTE — Telephone Encounter (Signed)
Voicemail left with patient to please call back with additional information.

## 2019-12-03 NOTE — Telephone Encounter (Signed)
Patient is calling about refills on her meds from Dr. Holley Raring. She had appt 11-25-19 but no meds prescribed. Please let patient know status.

## 2019-12-12 ENCOUNTER — Encounter: Payer: Self-pay | Admitting: Student in an Organized Health Care Education/Training Program

## 2019-12-12 ENCOUNTER — Ambulatory Visit
Payer: Medicare Other | Attending: Student in an Organized Health Care Education/Training Program | Admitting: Student in an Organized Health Care Education/Training Program

## 2019-12-12 ENCOUNTER — Other Ambulatory Visit: Payer: Self-pay

## 2019-12-12 VITALS — BP 125/85 | HR 74 | Temp 97.6°F | Resp 16 | Ht 69.0 in | Wt 175.0 lb

## 2019-12-12 DIAGNOSIS — M12811 Other specific arthropathies, not elsewhere classified, right shoulder: Secondary | ICD-10-CM | POA: Insufficient documentation

## 2019-12-12 DIAGNOSIS — M75101 Unspecified rotator cuff tear or rupture of right shoulder, not specified as traumatic: Secondary | ICD-10-CM | POA: Insufficient documentation

## 2019-12-12 DIAGNOSIS — G894 Chronic pain syndrome: Secondary | ICD-10-CM | POA: Diagnosis not present

## 2019-12-12 DIAGNOSIS — M19011 Primary osteoarthritis, right shoulder: Secondary | ICD-10-CM | POA: Diagnosis present

## 2019-12-12 MED ORDER — TIZANIDINE HCL 4 MG PO TABS: 4.0000 mg | ORAL_TABLET | Freq: Two times a day (BID) | ORAL | 2 refills | Status: AC | PRN

## 2019-12-12 MED ORDER — GABAPENTIN 300 MG PO CAPS: 300.0000 mg | ORAL_CAPSULE | Freq: Every day | ORAL | 1 refills | Status: DC | PRN

## 2019-12-12 NOTE — Progress Notes (Signed)
Safety precautions to be maintained throughout the outpatient stay will include: orient to surroundings, keep bed in low position, maintain call bell within reach at all times, provide assistance with transfer out of bed and ambulation.  

## 2019-12-12 NOTE — Progress Notes (Signed)
PROVIDER NOTE: Information contained herein reflects review and annotations entered in association with encounter. Interpretation of such information and data should be left to medically-trained personnel. Information provided to patient can be located elsewhere in the medical record under "Patient Instructions". Document created using STT-dictation technology, any transcriptional errors that may result from process are unintentional.    Patient: Kaylee Warner  Service Category: E/M  Provider: Gillis Santa, MD  DOB: 01-Feb-1945  DOS: 12/12/2019  Specialty: Interventional Pain Management  MRN: [id number]  Setting: Ambulatory outpatient  PCP: Sandrea Hughs, NP  Type: Established Patient    Referring Provider: Sandrea Hughs, NP  Location: Office  Delivery: Face-to-face     HPI  Kaylee Warner, a 74 y.o. year old female, is here today because of her Arthropathy of right shoulder [M19.011]. Kaylee Warner primary complain today is Shoulder Pain (right) and Neck Pain Last encounter: My last encounter with her was on 12/03/2019. Pertinent problems: Kaylee Warner has Rotator cuff syndrome of right shoulder; Chronic pain syndrome; Depression; Right rotator cuff tear arthropathy; Spinal stenosis, lumbar region, with neurogenic claudication; Lumbar facet arthropathy; Lumbar degenerative disc disease; and Chronic radicular lumbar pain on their pertinent problem list. Pain Assessment: Severity of Chronic pain is reported as a 7 /10. Location: Shoulder Right/to neck. Onset: More than a month ago. Quality: Aching. Timing: Intermittent. Modifying factor(s): meds, procedures. Vitals:  height is _0  (1.753 m) and weight is 175 lb (79.4 kg). Her temporal temperature is 97.6 F (36.4 C). Her blood pressure is 125/85 and her pulse is 74. Her respiration is 16 and oxygen saturation is 100%.   Reason for encounter: medication management.   He presents today with worsening right shoulder pain.  She is status post  right glenohumeral joint steroid injection on 10/28/2019 that provided her with significant pain relief for her right shoulder pain related to glenohumeral arthropathy.  She states that the pain is returning and would like to repeat the right shoulder steroid injection.  She also needs a refill of gabapentin.  She was unable to tolerate 200 mg twice a day secondary to side effects of sedation so she is taking 300 mg daily.  She is also out of her tizanidine which she states was helpful for her cervical paraspinal and shoulder spasms.  We will refill as below.  ROS  Constitutional: Denies any fever or chills Gastrointestinal: No reported hemesis, hematochezia, vomiting, or acute GI distress Musculoskeletal: Right shoulder pain Neurological: No reported episodes of acute onset apraxia, aphasia, dysarthria, agnosia, amnesia, paralysis, loss of coordination, or loss of consciousness  Medication Review  PARoxetine, Vitamin D3, amLODipine, aspirin EC, fluticasone, gabapentin, lisinopril, melatonin, meloxicam, and tiZANidine  History Review  Allergy: Kaylee Warner is allergic to celecoxib, codeine, crestor [rosuvastatin calcium], fexofenadine, and penicillins. Drug: Kaylee Warner  reports no history of drug use. Alcohol:  reports current alcohol use of about 7.0 standard drinks of alcohol per week. Tobacco:  reports that she has never smoked. She has never used smokeless tobacco. Social: Kaylee Warner  reports that she has never smoked. She has never used smokeless tobacco. She reports current alcohol use of about 7.0 standard drinks of alcohol per week. She reports that she does not use drugs. Medical:  has a past medical history of Allergy, Anemia, Arthritis, and Hypertension. Surgical: Kaylee Warner  has a past surgical history that includes [place] 2009; Joint replacement (2008); and Colonoscopy. Family: family history includes Arthritis in an other family member; Diabetes in  her sister, son, son, and another family  member; Hypertension in her son; Sjogren's syndrome in her daughter.  Laboratory Chemistry Profile   Renal Lab Results  Component Value Date   BUN 14 05/08/2019   CREATININE 0.56 (L) 05/08/2019   BCR 25 (H) 05/08/2019   GFRAA 106 05/08/2019   GFRNONAA 92 05/08/2019     Hepatic Lab Results  Component Value Date   AST 13 05/08/2019   ALT 10 05/08/2019   ALBUMIN 4.0 12/16/2015   ALKPHOS 53 12/16/2015     Electrolytes Lab Results  Component Value Date   NA 142 05/08/2019   K 4.3 05/08/2019   CL 109 05/08/2019   CALCIUM 9.2 05/08/2019     Bone No results found for: VD25OH, VD125OH2TOT, [id number], [id number], 25OHVITD1, 25OHVITD2, 25OHVITD3, TESTOFREE, TESTOSTERONE   Inflammation (CRP: Acute Phase) (ESR: Chronic Phase) No results found for: CRP, ESRSEDRATE, LATICACIDVEN     Note: Above Lab results reviewed.  Recent Imaging Review  DG PAIN CLINIC C-ARM 1-60 MIN NO REPORT Fluoro was used, but no Radiologist interpretation will be provided.  Please refer to "NOTES" tab for provider progress note. Note: Reviewed        Physical Exam  General appearance: Well nourished, well developed, and well hydrated. In no apparent acute distress Mental status: Alert, oriented x 3 (person, place, & time)       Respiratory: No evidence of acute respiratory distress Eyes: PERLA Vitals: BP 125/85   Pulse 74   Temp 97.6 F (36.4 C) (Temporal)   Resp 16   Ht _0  (1.753 m)   Wt 175 lb (79.4 kg)   SpO2 100%   BMI 25.84 kg/m  BMI: Estimated body mass index is 25.84 kg/m as calculated from the following:   Height as of this encounter: _1  (1.753 m).   Weight as of this encounter: 175 lb (79.4 kg). Ideal: Ideal body weight: 66.2 kg (145 lb 15.1 oz) Adjusted ideal body weight: 71.5 kg (157 lb 9.1 oz)  Cervical Spine Exam  Skin & Axial Inspection: No masses, redness, edema, swelling, or associated skin lesions Alignment: Symmetrical Functional ROM: Unrestricted ROM       Stability: No instability detected Muscle Tone/Strength: Functionally intact. No obvious neuro-muscular anomalies detected. Sensory (Neurological):  Musculoskeletal Palpation: No palpable anomalies                    Upper Extremity (UE) Exam    Side: Right upper extremity  Side: Left upper extremity   Skin & Extremity Inspection: Skin color, temperature, and hair growth are WNL. No peripheral edema or cyanosis. No masses, redness, swelling, asymmetry, or associated skin lesions. No contractures.  Skin & Extremity Inspection: Skin color, temperature, and hair growth are WNL. No peripheral edema or cyanosis. No masses, redness, swelling, asymmetry, or associated skin lesions. No contractures.   Functional ROM: Diminished ROM for shoulder  Functional ROM: Unrestricted ROM           Muscle Tone/Strength: Movement possible against some resistance (4/5) right shoulder   Muscle Tone/Strength: Functionally intact. No obvious neuro-muscular anomalies detected.   Sensory (Neurological): Arthropathic arthralgia          Sensory (Neurological): Unimpaired           Palpation: No palpable anomalies              Palpation: No palpable anomalies               Provocative Test(s):  Phalen's test: deferred Tinel's test: deferred Apley's scratch test (touch opposite shoulder):  Action 1 (Across chest): Decreased ROM Action 2 (Overhead): Decreased ROM Action 3 (LB reach): Decreased ROM   Provocative Test(s):  Phalen's test: deferred Tinel's test: deferred Apley's scratch test (touch opposite shoulder):  Action 1 (Across chest): deferred Action 2 (Overhead): deferred Action 3 (LB reach): deferred     Thoracic Spine Area Exam  Skin & Axial Inspection: No masses, redness, or swelling Alignment: Symmetrical Functional ROM: Unrestricted ROM Stability: No instability detected Muscle Tone/Strength: Functionally intact. No obvious neuro-muscular anomalies detected. Sensory (Neurological):  Unimpaired Muscle strength & Tone: No palpable anomalies  Lumbar Exam  Skin & Axial Inspection: No masses, redness, or swelling Alignment: Symmetrical Functional ROM: Pain restricted ROM affecting both sides Stability: No instability detected Muscle Tone/Strength: Functionally intact. No obvious neuro-muscular anomalies detected. Sensory (Neurological): Musculoskeletal pain pattern Palpation: No palpable anomalies       Provocative Tests: Hyperextension/rotation test: (+) bilaterally for facet joint pain. Lumbar quadrant test (Kemp's test): (+) bilateral for foraminal stenosis Lateral bending test: deferred today       Patrick's Maneuver: deferred today                   FABER* test: deferred today                   S-I anterior distraction/compression test: deferred today         S-I lateral compression test: deferred today         S-I Thigh-thrust test: deferred today         S-I Gaenslen's test: deferred today         *(Flexion, ABduction and External Rotation)  Gait & Posture Assessment  Ambulation: Limited Gait: Antalgic Posture: Difficulty standing up straight, due to pain   Lower Extremity Exam    Side: Right lower extremity  Side: Left lower extremity  Stability: No instability observed          Stability: No instability observed          Skin & Extremity Inspection: Evidence of prior arthroplastic surgery right knee  Skin & Extremity Inspection: Skin color, temperature, and hair growth are WNL. No peripheral edema or cyanosis. No masses, redness, swelling, asymmetry, or associated skin lesions. No contractures.  Functional ROM: Pain restricted ROM for hip and knee joints          Functional ROM: Unrestricted ROM                  Muscle Tone/Strength: Functionally intact. No obvious neuro-muscular anomalies detected.  Muscle Tone/Strength: Functionally intact. No obvious neuro-muscular anomalies detected.  Sensory (Neurological): Arthropathic arthralgia         Sensory (Neurological): Unimpaired        DTR: Patellar: deferred today Achilles: deferred today Plantar: deferred today  DTR: Patellar: deferred today Achilles: deferred today Plantar: deferred today  Palpation: No palpable anomalies  Palpation: No palpable anomalies     Assessment   Status Diagnosis  Persistent Persistent Controlled 1. Arthropathy of right shoulder   2. Right rotator cuff tear arthropathy   3. Chronic pain syndrome      Updated Problems: Problem  Right Rotator Cuff Tear Arthropathy  Spinal Stenosis, Lumbar Region, With Neurogenic Claudication  Lumbar Facet Arthropathy  Lumbar Degenerative Disc Disease  Chronic Radicular Lumbar Pain  Depression  Chronic Pain Syndrome  Rotator Cuff Syndrome of Right Shoulder    Plan of Care  Kaylee Warner has a current medication list which includes the following long-term medication(s): fluticasone, lisinopril, paroxetine, amlodipine, and gabapentin.  Pharmacotherapy (Medications Ordered): Meds ordered this encounter  Medications  . gabapentin (NEURONTIN) 300 MG capsule    Sig: Take 1 capsule (300 mg total) by mouth daily as needed.    Dispense:  90 capsule    Refill:  1    Fill one day early if pharmacy is closed on scheduled refill date. May substitute for generic if available.  Marland Kitchen tiZANidine (ZANAFLEX) 4 MG tablet    Sig: Take 1 tablet (4 mg total) by mouth 2 (two) times daily as needed for muscle spasms.    Dispense:  60 tablet    Refill:  2    Do not place this medication, or any other prescription from our practice, on "Automatic Refill". Patient may have prescription filled one day early if pharmacy is closed on scheduled refill date.   Orders:  Orders Placed This Encounter  Procedures  . SHOULDER INJECTION    Standing Status:   Future    Standing Expiration Date:   03/11/2020    Scheduling Instructions:     Side: RIGHT     Sedation: Patient's choice.     Timeframe: As soon as schedule allows     Order Specific Question:   Where will this procedure be performed?    Answer:   [facility]    Comments:   Waldemar Siegel   Follow-up plan:   Return in about 3 weeks (around 01/02/2020) for R shoulder injection (supine) w.o sedation #2.     Right posterior glenohumeral joint injection 10/28/2019: 90% pain relief, repeat as needed     Recent Visits Date Type Provider Dept  11/25/19 Telemedicine Gillis Santa, MD [facility]  10/28/19 Procedure visit Gillis Santa, MD [facility]  10/15/19 Office Visit Gillis Santa, MD [facility]  Showing recent visits within past 90 days and meeting all other requirements Today's Visits Date Type Provider Dept  12/12/19 Office Visit Gillis Santa, MD [facility]  Showing today's visits and meeting all other requirements Future Appointments No visits were found meeting these conditions. Showing future appointments within next 90 days and meeting all other requirements  I discussed the assessment and treatment plan with the patient. The patient was provided an opportunity to ask questions and all were answered. The patient agreed with the plan and demonstrated an understanding of the instructions.  Patient advised to call back or seek an in-person evaluation if the symptoms or condition worsens.  Duration of encounter: 30 minutes.  Note by: Gillis Santa, MD Date: 12/12/2019; Time: 1:58 PM

## 2019-12-17 ENCOUNTER — Other Ambulatory Visit: Payer: Self-pay | Admitting: Family

## 2019-12-17 DIAGNOSIS — R413 Other amnesia: Secondary | ICD-10-CM

## 2019-12-22 ENCOUNTER — Other Ambulatory Visit: Payer: Self-pay | Admitting: Family

## 2019-12-22 DIAGNOSIS — R413 Other amnesia: Secondary | ICD-10-CM

## 2019-12-23 ENCOUNTER — Other Ambulatory Visit: Payer: Self-pay

## 2019-12-23 ENCOUNTER — Ambulatory Visit (INDEPENDENT_AMBULATORY_CARE_PROVIDER_SITE_OTHER): Payer: Medicare Other | Admitting: Family

## 2019-12-23 ENCOUNTER — Encounter: Payer: Self-pay | Admitting: Family

## 2019-12-23 VITALS — BP 140/88 | HR 84 | Temp 98.6°F | Resp 16 | Ht 69.0 in

## 2019-12-23 DIAGNOSIS — I10 Essential (primary) hypertension: Secondary | ICD-10-CM | POA: Diagnosis not present

## 2019-12-23 DIAGNOSIS — R0989 Other specified symptoms and signs involving the circulatory and respiratory systems: Secondary | ICD-10-CM | POA: Diagnosis not present

## 2019-12-23 LAB — POCT RAPID STREP A (OFFICE): Rapid Strep A Screen: NEGATIVE

## 2019-12-23 LAB — POC COVID19 BINAXNOW: SARS Coronavirus 2 Ag: NEGATIVE

## 2019-12-23 LAB — POCT INFLUENZA A/B
Influenza A, POC: NEGATIVE
Influenza B, POC: NEGATIVE

## 2019-12-23 MED ORDER — GUAIFENESIN-DM 100-10 MG/5ML PO SYRP: 5.0000 mL | ORAL_SOLUTION | ORAL | 0 refills | Status: DC | PRN

## 2019-12-23 MED ORDER — CETIRIZINE HCL 10 MG PO TABS: 10.0000 mg | ORAL_TABLET | Freq: Every day | ORAL | 11 refills | Status: DC

## 2019-12-23 MED ORDER — AMLODIPINE BESYLATE 10 MG PO TABS: ORAL_TABLET | ORAL | 1 refills | Status: DC

## 2019-12-23 MED ORDER — LISINOPRIL 40 MG PO TABS: 40.0000 mg | ORAL_TABLET | Freq: Every day | ORAL | 1 refills | Status: DC

## 2019-12-23 NOTE — Progress Notes (Signed)
Provider: Marlowe Sax FNP-C  Alean Kromer, Nelda Bucks, NP  Patient Care Team: Yaa Donnellan, Nelda Bucks, NP as PCP - General (Family Medicine)  Extended Emergency Contact Information Primary Emergency Contact: [place] Mobile Phone: [phone] Relation: Son Secondary Emergency Contact: Demorest,James R Address: [address]          [place], [place] [address] [place] Phone: [phone] Relation: Spouse  Code Status:  DNR Goals of care: Advanced Directive information Advanced Directives 12/23/2019  Does Patient Have a Medical Advance Directive? No  Type of Advance Directive -  Copy of [place] in Chart? -  Would patient like information on creating a medical advance directive? No - Patient declined     Chief Complaint  Patient presents with  . Acute Visit    Sick.    HPI:  Pt is a 74 y.o. female seen today for an acute visit for evaluation of  Had dry cough on Friday so she took cough medication which made her sick on her stomach.she lied in bed whole day Saturday. She was able to eat chicken noodle soup on Sunday with crackers and drank ginger ale. Has been sweating but no fever. Has had sore throat with thick mucus and runny nose.  Had flu shot last weekend.Has had COVID-19 vaccine and her boost.  On Allegra and Flonase. Has not been in contact with anyone that was sick with COVID-19 or URI thought states has been out and about.she does wear her mask.   Past Medical History:  Diagnosis Date  . Allergy   . Anemia   . Arthritis   . Hypertension    Past Surgical History:  Procedure Laterality Date  . COLONOSCOPY    . JOINT REPLACEMENT  2008  . SARK 2009     Dr. Jolee Ewing     Allergies  Allergen Reactions  . Celecoxib     REACTION: blood pressure went up  . Codeine Itching  . Crestor [Rosuvastatin Calcium]     Muscle cramping and achy  . Fexofenadine Other (See Comments)    unknown  . Penicillins  Rash    Outpatient Encounter Medications as of 12/23/2019  Medication Sig  . amLODipine (NORVASC) 10 MG tablet Take 1/2 (one-half) tablet by mouth once daily  . aspirin EC 81 MG tablet Take 1 tablet (81 mg total) by mouth daily.  . Cholecalciferol (VITAMIN D3) 125 MCG (5000 UT) CAPS Take 1 capsule (5,000 Units total) by mouth every morning.  . fluticasone (FLONASE) 50 MCG/ACT nasal spray Place 2 sprays into both nostrils daily.  Marland Kitchen gabapentin (NEURONTIN) 300 MG capsule Take 1 capsule (300 mg total) by mouth daily as needed.  Marland Kitchen lisinopril (ZESTRIL) 40 MG tablet Take 1 tablet (40 mg total) by mouth daily.  . melatonin 3 MG TABS tablet Take 3 mg by mouth as needed.  . meloxicam (MOBIC) 15 MG tablet Take 1 tablet (15 mg total) by mouth daily.  Marland Kitchen PARoxetine (PAXIL) 20 MG tablet Take 1 tablet (20 mg total) by mouth daily.  Marland Kitchen tiZANidine (ZANAFLEX) 4 MG tablet Take 1 tablet (4 mg total) by mouth 2 (two) times daily as needed for muscle spasms.   No facility-administered encounter medications on file as of 12/23/2019.    Review of Systems  Constitutional: Negative for appetite change, chills, fatigue and fever.  HENT: Positive for rhinorrhea and sore throat. Negative for congestion, ear discharge, ear pain, sinus pressure, sinus pain, sneezing and trouble  swallowing.   Eyes: Negative for photophobia, pain, discharge, redness, itching and visual disturbance.  Respiratory: Positive for cough. Negative for chest tightness, shortness of breath and wheezing.   Cardiovascular: Negative for chest pain, palpitations and leg swelling.  Gastrointestinal: Negative for abdominal distention, abdominal pain, constipation, diarrhea, nausea and vomiting.  Genitourinary: Negative for difficulty urinating, dysuria, flank pain, frequency and urgency.  Musculoskeletal: Positive for arthralgias. Negative for gait problem and joint swelling.  Skin: Negative for color change, pallor and rash.  Neurological: Negative for  dizziness, speech difficulty, weakness, light-headedness, numbness and headaches.  Psychiatric/Behavioral: Negative for agitation, behavioral problems, confusion and sleep disturbance. The patient is not nervous/anxious.     Immunization History  Administered Date(s) Administered  . Fluad Quad(high Dose 65+) 10/29/2018  . Influenza, High Dose Seasonal PF 11/24/2017  . Influenza,inj,Quad PF,6+ Mos 09/24/2015, 12/15/2016  . Moderna Sars-Covid-2 Vaccination 03/29/2019, 04/26/2019, 12/17/2019  . Pneumococcal Conjugate-13 12/16/2015  . Pneumococcal Polysaccharide-23 06/27/2018  . Td 01/17/2017  . Tdap 11/23/2010   Pertinent  Health Maintenance Due  Topic Date Due  . MAMMOGRAM  09/06/2018  . INFLUENZA VACCINE  08/11/2019  . COLONOSCOPY  10/17/2021  . DEXA SCAN  Completed  . PNA vac Low Risk Adult  Completed   Fall Risk  12/23/2019 12/12/2019 10/15/2019 09/10/2019 07/09/2019  Falls in the past year? 0 0 0 0 1  Number falls in past yr: 0 - - 0 1  Comment - - - - -  Injury with Fall? 0 - - 0 0   Functional Status Survey:    Vitals:   12/23/19 1040  BP: 140/88  Pulse: 84  Resp: 16  Temp: 98.6 F (37 C)  SpO2: 94%  Height: 5\' 9"  (1.753 m)   Body mass index is 25.84 kg/m. Physical Exam Vitals reviewed.  Constitutional:      General: She is not in acute distress.    Appearance: She is overweight. She is not ill-appearing.  HENT:     Head: Normocephalic.     Right Ear: Tympanic membrane, ear canal and external ear normal. There is no impacted cerumen.     Left Ear: Tympanic membrane, ear canal and external ear normal. There is no impacted cerumen.     Nose: Congestion and rhinorrhea present.     Mouth/Throat:     Mouth: Mucous membranes are moist.     Pharynx: No oropharyngeal exudate or posterior oropharyngeal erythema.  Eyes:     General: No scleral icterus.       Right eye: No discharge.        Left eye: No discharge.     Extraocular Movements: Extraocular movements  intact.     Conjunctiva/sclera: Conjunctivae normal.     Pupils: Pupils are equal, round, and reactive to light.  Neck:     Vascular: No carotid bruit.  Cardiovascular:     Rate and Rhythm: Normal rate and regular rhythm.     Pulses: Normal pulses.     Heart sounds: Normal heart sounds. No murmur heard. No friction rub. No gallop.   Pulmonary:     Effort: Pulmonary effort is normal. No respiratory distress.     Breath sounds: Normal breath sounds. No wheezing, rhonchi or rales.  Chest:     Chest wall: No tenderness.  Abdominal:     General: Bowel sounds are normal. There is no distension.     Palpations: Abdomen is soft. There is no mass.     Tenderness: There is no  abdominal tenderness. There is no right CVA tenderness, guarding or rebound.  Musculoskeletal:     Cervical back: Normal range of motion. No rigidity or tenderness.  Lymphadenopathy:     Cervical: No cervical adenopathy.  Skin:    General: Skin is warm and dry.     Coloration: Skin is not pale.     Findings: No bruising, erythema or rash.  Neurological:     Mental Status: She is alert and oriented to person, place, and time.     Cranial Nerves: No cranial nerve deficit.     Sensory: No sensory deficit.     Motor: No weakness.     Coordination: Coordination normal.     Gait: Gait normal.  Psychiatric:        Mood and Affect: Mood normal.        Behavior: Behavior normal.        Thought Content: Thought content normal.        Judgment: Judgment normal.     Labs reviewed: Recent Labs    05/08/19 0821  NA 142  K 4.3  CL 109  CO2 25  GLUCOSE 106*  BUN 14  CREATININE 0.56*  CALCIUM 9.2   Recent Labs    05/08/19 0821  AST 13  ALT 10  BILITOT 0.6  PROT 6.7   Recent Labs    05/08/19 0821  WBC 5.0  NEUTROABS 2,705  HGB 12.3  HCT 39.3  MCV 77.2*  PLT 353   Lab Results  Component Value Date   TSH 1.88 05/08/2019   No results found for: HGBA1C Lab Results  Component Value Date   CHOL 221  (H) 10/31/2019   HDL 70 10/31/2019   LDLCALC 135 (H) 10/31/2019   TRIG 68 10/31/2019   CHOLHDL 3.2 10/31/2019    Significant Diagnostic Results in last 30 days:  No results found.  Assessment/Plan  1. Symptoms of upper respiratory infection (URI) Afebrile.clear nasal drainage noted.bilateral lungs Clear to auscultation. - POC Rapid Strep A negative - POC Influenza A/B test negative  - POC COVID-19 negative. - advised to take Cetrizine for nasal drainage. - Robitussin DM for cough.  - cetirizine (ZYRTEC) 10 MG tablet; Take 1 tablet (10 mg total) by mouth daily.  Dispense: 30 tablet; Refill: 11 - guaiFENesin-dextromethorphan (ROBITUSSIN DM) 100-10 MG/5ML syrup; Take 5 mLs by mouth every 4 (four) hours as needed for cough.  Dispense: 118 mL; Refill: 0 - Encouraged to increase her fluid intake including warm tea/soup - encouraged to get plenty of rest   2. Essential hypertension B/p well controlled.Request medication refill.  - lisinopril (ZESTRIL) 40 MG tablet; Take 1 tablet (40 mg total) by mouth daily.  Dispense: 90 tablet; Refill: 1 - amLODipine (NORVASC) 10 MG tablet; Take 1/2 (one-half) tablet by mouth once daily  Dispense: 45 tablet; Refill: 1  Family/ staff Communication: Reviewed plan of care with patient  Labs/tests ordered:  - POC Rapid Strep A  - POC Influenza A/B test   - POC COVID-19   Next Appointment: As needed if symptoms worsen or fail to improve.   Sandrea Hughs, NP

## 2019-12-23 NOTE — Telephone Encounter (Signed)
Please verify with patient if she still taking Aricept.

## 2019-12-23 NOTE — Patient Instructions (Addendum)
Notify provider if running any fever,chills,or shortness of breath.   Upper Respiratory Infection, Adult An upper respiratory infection (URI) affects the nose, throat, and upper air passages. URIs are caused by germs (viruses). The most common type of URI is often called "the common cold." Medicines cannot cure URIs, but you can do things at home to relieve your symptoms. URIs usually get better within 7-10 days. Follow these instructions at home: Activity  Rest as needed.  If you have a fever, stay home from work or school until your fever is gone, or until your doctor says you may return to work or school. ? You should stay home until you cannot spread the infection anymore (you are not contagious). ? Your doctor may have you wear a face mask so you have less risk of spreading the infection. Relieving symptoms  Gargle with a salt-water mixture 3-4 times a day or as needed. To make a salt-water mixture, completely dissolve -1 tsp of salt in 1 cup of warm water.  Use a cool-mist humidifier to add moisture to the air. This can help you breathe more easily. Eating and drinking   Drink enough fluid to keep your pee (urine) pale yellow.  Eat soups and other clear broths. General instructions   Take over-the-counter and prescription medicines only as told by your doctor. These include cold medicines, fever reducers, and cough suppressants.  Do not use any products that contain nicotine or tobacco. These include cigarettes and e-cigarettes. If you need help quitting, ask your doctor.  Avoid being where people are smoking (avoid secondhand smoke).  Make sure you get regular shots and get the flu shot every year.  Keep all follow-up visits as told by your doctor. This is important. How to avoid spreading infection to others   Wash your hands often with soap and water. If you do not have soap and water, use hand sanitizer.  Avoid touching your mouth, face, eyes, or nose.  Cough or  sneeze into a tissue or your sleeve or elbow. Do not cough or sneeze into your hand or into the air. Contact a doctor if:  You are getting worse, not better.  You have any of these: ? A fever. ? Chills. ? Brown or red mucus in your nose. ? Yellow or brown fluid (discharge)coming from your nose. ? Pain in your face, especially when you bend forward. ? Swollen neck glands. ? Pain with swallowing. ? White areas in the back of your throat. Get help right away if:  You have shortness of breath that gets worse.  You have very bad or constant: ? Headache. ? Ear pain. ? Pain in your forehead, behind your eyes, and over your cheekbones (sinus pain). ? Chest pain.  You have long-lasting (chronic) lung disease along with any of these: ? Wheezing. ? Long-lasting cough. ? Coughing up blood. ? A change in your usual mucus.  You have a stiff neck.  You have changes in your: ? Vision. ? Hearing. ? Thinking. ? Mood. Summary  An upper respiratory infection (URI) is caused by a germ called a virus. The most common type of URI is often called "the common cold."  URIs usually get better within 7-10 days.  Take over-the-counter and prescription medicines only as told by your doctor. This information is not intended to replace advice given to you by your health care provider. Make sure you discuss any questions you have with your health care provider. Document Revised: 01/04/2018 Document Reviewed: 08/19/2016  Elsevier Patient Education  [employer].

## 2019-12-23 NOTE — Telephone Encounter (Signed)
Aricept was discontinued. Medication alert with meloxicam.

## 2019-12-24 ENCOUNTER — Other Ambulatory Visit: Payer: Self-pay | Admitting: *Deleted

## 2019-12-24 MED ORDER — MELOXICAM 15 MG PO TABS: 15.0000 mg | ORAL_TABLET | Freq: Every day | ORAL | 3 refills | Status: DC

## 2019-12-24 NOTE — Telephone Encounter (Signed)
Received refill Request from [facility] and sent to [facility] for approval due to [place].

## 2020-01-08 ENCOUNTER — Ambulatory Visit
Admission: RE | Admit: 2020-01-08 | Discharge: 2020-01-08 | Disposition: A | Discharge status: Home / Self Care | Payer: Medicare Other | Source: Ambulatory Visit | Attending: Nurse Practitioner | Admitting: Nurse Practitioner

## 2020-01-08 ENCOUNTER — Other Ambulatory Visit: Payer: Self-pay

## 2020-01-08 DIAGNOSIS — Z1231 Encounter for screening mammogram for malignant neoplasm of breast: Secondary | ICD-10-CM

## 2020-01-15 ENCOUNTER — Ambulatory Visit: Payer: Medicare Other | Admitting: Student in an Organized Health Care Education/Training Program

## 2020-02-06 ENCOUNTER — Ambulatory Visit: Payer: Self-pay | Admitting: Family

## 2020-02-11 ENCOUNTER — Ambulatory Visit (INDEPENDENT_AMBULATORY_CARE_PROVIDER_SITE_OTHER): Payer: Medicare Other | Admitting: Family

## 2020-02-11 ENCOUNTER — Other Ambulatory Visit: Payer: Self-pay

## 2020-02-11 ENCOUNTER — Encounter: Payer: Self-pay | Admitting: Family

## 2020-02-11 VITALS — BP 124/82 | HR 78 | Temp 96.9°F | Resp 16 | Ht 69.0 in | Wt 183.6 lb

## 2020-02-11 DIAGNOSIS — E782 Mixed hyperlipidemia: Secondary | ICD-10-CM

## 2020-02-11 DIAGNOSIS — W06XXXA Fall from bed, initial encounter: Secondary | ICD-10-CM | POA: Diagnosis not present

## 2020-02-11 DIAGNOSIS — R0982 Postnasal drip: Secondary | ICD-10-CM

## 2020-02-11 DIAGNOSIS — F4329 Adjustment disorder with other symptoms: Secondary | ICD-10-CM

## 2020-02-11 LAB — LIPID PANEL
Cholesterol: 252 mg/dL — ABNORMAL HIGH (ref <200)
HDL: 63 mg/dL (ref >=50)
LDL Cholesterol (Calc): 166 mg/dL (calc) — ABNORMAL HIGH
Non-HDL Cholesterol (Calc): 189 mg/dL (calc) — ABNORMAL HIGH (ref <130)
Total CHOL/HDL Ratio: 4 (calc) (ref <5.0)
Triglycerides: 116 mg/dL (ref <150)

## 2020-02-11 MED ORDER — PAROXETINE HCL 30 MG PO TABS: 30.0000 mg | ORAL_TABLET | Freq: Every day | ORAL | 3 refills | Status: DC

## 2020-02-11 NOTE — Progress Notes (Signed)
Provider: Marlowe Sax FNP-C  Kaylee Warner, Nelda Bucks, NP  Patient Care Team: Cederick Broadnax, Nelda Bucks, NP as PCP - General (Family Medicine)  Extended Emergency Contact Information Primary Emergency Contact: [place] of [place] Mobile Phone: [phone] Relation: Son Secondary Emergency Contact: Or[place],James R Address: [address]          [place], [place] [address] [place] Phone: [phone] Relation: Spouse  Code Status: Full Code  Goals of care: Advanced Directive information Advanced Directives 02/11/2020  Does Patient Have a Medical Advance Directive? No  Type of Advance Directive -  Copy of [place] in Chart? -  Would patient like information on creating a medical advance directive? No - Patient declined     Chief Complaint  Patient presents with  . Acute Visit    Abnormal lipid panel.  . Concern    Moderate Fall Risk.    HPI:  Pt is a 75 y.o. female seen today for an acute visit for evaluation of abnormal lipid panel.she is here to recheck lipid panel.she states fell out of bed 3 weeks ago still has soreness at the back of her head where she hit her bedside tablet.had bleeding bt was able to stop.she didn't go to the ED for evaluation.felt like she was dreaming and got too close to the edge of the bed.on ASA 81 mg tablet daily. Had little headache.Took a goody with relief.still sore. She denies any loss of consciousness.No dizziness,nausea,vomting,  States feeling more depressed.Has had to care for his husband who had bilateral cataract surgery.does not get to travel or do activities together like before.Stays indoors most time which makes her more depressed.Has not been able to go back to the [facility] to exercise which used to help with her depression too. Takes care of her young Grandchildren during the weekend who keeps her very busy.No suicide ideation.she does not want to get off Paxil which has worked well for her.    Previous nasal congestion and cough resolved except still has some post nasal drip.she takes zyrtec with relief.     Past Medical History:  Diagnosis Date  . Allergy   . Anemia   . Arthritis   . Hypertension    Past Surgical History:  Procedure Laterality Date  . COLONOSCOPY    . JOINT REPLACEMENT  2008  . SARK 2009     Dr. Jolee Ewing     Allergies  Allergen Reactions  . Celecoxib     REACTION: blood pressure went up  . Codeine Itching  . Crestor [Rosuvastatin Calcium]     Muscle cramping and achy  . Fexofenadine Other (See Comments)    unknown  . Penicillins Rash    Outpatient Encounter Medications as of 02/11/2020  Medication Sig  . amLODipine (NORVASC) 10 MG tablet Take 1/2 (one-half) tablet by mouth once daily  . aspirin EC 81 MG tablet Take 1 tablet (81 mg total) by mouth daily.  . cetirizine (ZYRTEC) 10 MG tablet Take 1 tablet (10 mg total) by mouth daily.  . Cholecalciferol (VITAMIN D3) 125 MCG (5000 UT) CAPS Take 1 capsule (5,000 Units total) by mouth every morning.  . donepezil (ARICEPT) 10 MG tablet TAKE 1 TABLET BY MOUTH AT BEDTIME  . fluticasone (FLONASE) 50 MCG/ACT nasal spray Place 2 sprays into both nostrils daily.  Marland Kitchen gabapentin (NEURONTIN) 300 MG capsule Take 1 capsule (300 mg total) by mouth daily as needed.  Marland Kitchen guaiFENesin-dextromethorphan (ROBITUSSIN DM) 100-10 MG/5ML syrup Take  5 mLs by mouth every 4 (four) hours as needed for cough.  Marland Kitchen lisinopril (ZESTRIL) 40 MG tablet Take 1 tablet (40 mg total) by mouth daily.  . melatonin 3 MG TABS tablet Take 3 mg by mouth as needed.  . meloxicam (MOBIC) 15 MG tablet Take 1 tablet (15 mg total) by mouth daily.  Marland Kitchen PARoxetine (PAXIL) 20 MG tablet Take 1 tablet (20 mg total) by mouth daily.  Marland Kitchen tiZANidine (ZANAFLEX) 4 MG tablet Take 1 tablet (4 mg total) by mouth 2 (two) times daily as needed for muscle spasms.   No facility-administered encounter medications on file as of 02/11/2020.    Review of Systems   Constitutional: Negative for appetite change, chills, fatigue and fever.  HENT: Positive for postnasal drip and rhinorrhea. Negative for congestion, sinus pressure, sinus pain, sneezing and sore throat.   Respiratory: Negative for cough, chest tightness, shortness of breath and wheezing.   Cardiovascular: Negative for chest pain, palpitations and leg swelling.  Gastrointestinal: Negative for abdominal distention, abdominal pain, constipation, diarrhea, nausea and vomiting.  Musculoskeletal: Positive for arthralgias. Negative for gait problem, joint swelling and myalgias.       Right shoulder pain has improved with cortisol injection managed by pain specialist  Skin: Negative for color change, pallor and rash.  Neurological: Negative for dizziness, speech difficulty, light-headedness, numbness and headaches.  Hematological: Does not bruise/bleed easily.  Psychiatric/Behavioral: Negative for agitation, behavioral problems and sleep disturbance. The patient is not nervous/anxious.        Depressed     Immunization History  Administered Date(s) Administered  . Fluad Quad(high Dose 65+) 10/29/2018  . Influenza, High Dose Seasonal PF 11/24/2017  . Influenza,inj,Quad PF,6+ Mos 09/24/2015, 12/15/2016  . Moderna Sars-Covid-2 Vaccination 03/29/2019, 04/26/2019, 11/23/2019, 12/17/2019  . Pneumococcal Conjugate-13 12/16/2015  . Pneumococcal Polysaccharide-23 06/27/2018  . Td 01/17/2017  . Tdap 11/23/2010   Pertinent  Health Maintenance Due  Topic Date Due  . INFLUENZA VACCINE  08/11/2019  . COLONOSCOPY (Pts 45-50yrs Insurance coverage will need to be confirmed)  10/17/2021  . MAMMOGRAM  01/07/2022  . DEXA SCAN  Completed  . PNA vac Low Risk Adult  Completed   Fall Risk  02/11/2020 12/23/2019 12/12/2019 10/15/2019 09/10/2019  Falls in the past year? 1 0 0 0 0  Number falls in past yr: 0 0 - - 0  Comment - - - - -  Injury with Fall? 1 0 - - 0   Functional Status Survey:    Vitals:    02/11/20 0904  BP: 124/82  Pulse: 78  Resp: 16  Temp: (!) 96.9 F (36.1 C)  SpO2: 96%  Weight: 183 lb 9.6 oz (83.3 kg)  Height: 5\' 9"  (1.753 m)   Body mass index is 27.11 kg/m. Physical Exam Vitals reviewed.  Constitutional:      Appearance: She is overweight.  HENT:     Head: Normocephalic.     Nose: Nose normal. No congestion or rhinorrhea.     Mouth/Throat:     Mouth: Mucous membranes are moist.     Pharynx: Oropharynx is clear. No oropharyngeal exudate or posterior oropharyngeal erythema.  Eyes:     General: No scleral icterus.       Right eye: No discharge.        Left eye: No discharge.     Extraocular Movements: Extraocular movements intact.     Conjunctiva/sclera: Conjunctivae normal.     Pupils: Pupils are equal, round, and reactive to light.  Neck:  Vascular: No carotid bruit.  Cardiovascular:     Rate and Rhythm: Normal rate and regular rhythm.     Pulses: Normal pulses.     Heart sounds: Normal heart sounds. No murmur heard. No friction rub. No gallop.   Pulmonary:     Effort: Pulmonary effort is normal. No respiratory distress.     Breath sounds: Normal breath sounds. No wheezing, rhonchi or rales.  Chest:     Chest wall: No tenderness.  Abdominal:     General: Bowel sounds are normal. There is no distension.     Palpations: Abdomen is soft. There is no mass.     Tenderness: There is no abdominal tenderness. There is no right CVA tenderness, left CVA tenderness, guarding or rebound.  Musculoskeletal:        General: No swelling or tenderness. Normal range of motion.     Cervical back: Normal range of motion. No rigidity or tenderness.     Right lower leg: No edema.     Left lower leg: No edema.  Lymphadenopathy:     Cervical: No cervical adenopathy.  Skin:    General: Skin is warm and dry.     Coloration: Skin is not pale.     Findings: No bruising, erythema or rash.  Neurological:     Mental Status: She is alert and oriented to person, place,  and time.     Cranial Nerves: No cranial nerve deficit.     Sensory: No sensory deficit.     Motor: No weakness.     Coordination: Coordination normal.     Gait: Gait normal.  Psychiatric:        Mood and Affect: Mood normal.        Behavior: Behavior normal.        Thought Content: Thought content normal.        Judgment: Judgment normal.     Labs reviewed: Recent Labs    05/08/19 0821  NA 142  K 4.3  CL 109  CO2 25  GLUCOSE 106*  BUN 14  CREATININE 0.56*  CALCIUM 9.2   Recent Labs    05/08/19 0821  AST 13  ALT 10  BILITOT 0.6  PROT 6.7   Recent Labs    05/08/19 0821  WBC 5.0  NEUTROABS 2,705  HGB 12.3  HCT 39.3  MCV 77.2*  PLT 353   Lab Results  Component Value Date   TSH 1.88 05/08/2019   No results found for: HGBA1C Lab Results  Component Value Date   CHOL 221 (H) 10/31/2019   HDL 70 10/31/2019   LDLCALC 135 (H) 10/31/2019   TRIG 68 10/31/2019   CHOLHDL 3.2 10/31/2019    Significant Diagnostic Results in last 30 days:  No results found.  Assessment/Plan 1. 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  - PARoxetine (PAXIL) 30 MG tablet; Take 1 tablet (30 mg total) by mouth daily.  Dispense: 30 tablet; Refill: 3  2. Mixed hyperlipidemia LDL not at goal. - dietary and lifestyle modification discussed - Had muscle cramping with statin. - will add Tricor 145 mg tablet daily if cholesterol still high.  - Lipid Panel  3. Fall from bed, initial encounter [facility] off the bed three weeks ago hitting back of the head.still sore to touch.Had no loss of consciousness and did not go to ED for evaluation. -  Neuro exam normal. - continue on OTC Tylenol as needed for pain   4. PND (post-nasal drip) - continue on cetirizine 10 mg tablet daily.  Family/ staff Communication: Reviewed plan of care with  patient  Labs/tests ordered: Has Lipid panel orders in place.   Next Appointment: 3 months Annual Physical Examination with same day fasting labs.  Sandrea Hughs, NP

## 2020-02-12 ENCOUNTER — Other Ambulatory Visit: Payer: Self-pay

## 2020-02-12 ENCOUNTER — Telehealth: Payer: Self-pay

## 2020-02-12 MED ORDER — FENOFIBRATE 145 MG PO TABS: 145.0000 mg | ORAL_TABLET | Freq: Every day | ORAL | 2 refills | Status: DC

## 2020-02-12 NOTE — Telephone Encounter (Signed)
Gave lab results and sent scrip to [facility]   r DinaNgetich, Dinah C, NP  [facility] Clinical Pool Total cholesterol and LDL are higher than previous level.recommend a low saturated fat and high vegetable diet  Start on Tricor 145 mg tablet one by mouth daily then will recheck fasting lipid panel in 4 months

## 2020-02-12 NOTE — Telephone Encounter (Signed)
Noted  

## 2020-02-18 ENCOUNTER — Telehealth: Payer: Self-pay | Admitting: *Deleted

## 2020-02-18 NOTE — Telephone Encounter (Signed)
Patient called and stated that the Fenofibrate is making her stomach ache.  Patient is wanting to know if she can take 1/2 of the tablet at a time instead of a whole one and see if that helps.   Also wants to know what you recommend her to eat. Stated that she has already cut back the fried food.  She likes to eat eggs and eating salads with [place] dressing.   Please Advise.

## 2020-02-18 NOTE — Telephone Encounter (Signed)
You can try taking half tablet at a time with food.recommend referral to Dietician/Nutritionist to assist with hyperlipidemia dietary concerns.

## 2020-02-18 NOTE — Telephone Encounter (Signed)
LMOM to return call.

## 2020-02-19 NOTE — Telephone Encounter (Signed)
LMOM to return call.

## 2020-02-20 NOTE — Telephone Encounter (Signed)
Patient aware of Dinah's response and states she will try taking 1/2 pill and call back if she would like to consider a referral in the future to assist with diet. Medication list updated to reflect patient taken 1/2 tablet versus a whole

## 2020-04-28 ENCOUNTER — Other Ambulatory Visit: Payer: Self-pay | Admitting: Family

## 2020-04-28 DIAGNOSIS — R413 Other amnesia: Secondary | ICD-10-CM

## 2020-04-30 ENCOUNTER — Other Ambulatory Visit: Payer: Self-pay | Admitting: Family

## 2020-04-30 DIAGNOSIS — J310 Chronic rhinitis: Secondary | ICD-10-CM

## 2020-05-25 ENCOUNTER — Other Ambulatory Visit: Payer: Self-pay

## 2020-05-25 ENCOUNTER — Ambulatory Visit (HOSPITAL_BASED_OUTPATIENT_CLINIC_OR_DEPARTMENT_OTHER): Payer: Medicare Other | Admitting: Student in an Organized Health Care Education/Training Program

## 2020-05-25 ENCOUNTER — Ambulatory Visit
Admission: RE | Admit: 2020-05-25 | Discharge: 2020-05-25 | Disposition: A | Discharge status: Home / Self Care | Payer: Medicare Other | Source: Ambulatory Visit | Attending: Student in an Organized Health Care Education/Training Program | Admitting: Student in an Organized Health Care Education/Training Program

## 2020-05-25 ENCOUNTER — Encounter: Payer: Self-pay | Admitting: Student in an Organized Health Care Education/Training Program

## 2020-05-25 VITALS — BP 137/89 | HR 86 | Temp 97.1°F | Resp 19 | Ht 69.0 in | Wt 182.0 lb

## 2020-05-25 DIAGNOSIS — M1711 Unilateral primary osteoarthritis, right knee: Secondary | ICD-10-CM | POA: Insufficient documentation

## 2020-05-25 DIAGNOSIS — G894 Chronic pain syndrome: Secondary | ICD-10-CM | POA: Diagnosis not present

## 2020-05-25 DIAGNOSIS — M75101 Unspecified rotator cuff tear or rupture of right shoulder, not specified as traumatic: Secondary | ICD-10-CM | POA: Diagnosis not present

## 2020-05-25 DIAGNOSIS — M12811 Other specific arthropathies, not elsewhere classified, right shoulder: Secondary | ICD-10-CM | POA: Insufficient documentation

## 2020-05-25 DIAGNOSIS — M19011 Primary osteoarthritis, right shoulder: Secondary | ICD-10-CM

## 2020-05-25 MED ORDER — LIDOCAINE HCL (PF) 2 % IJ SOLN
INTRAMUSCULAR | Status: AC
  Filled 2020-05-25: qty 10

## 2020-05-25 MED ORDER — IOHEXOL 180 MG/ML  SOLN
10.0000 mL | Freq: Once | INTRAMUSCULAR | Status: AC
  Administered 2020-05-25: 10 mL via INTRA_ARTICULAR

## 2020-05-25 MED ORDER — METHYLPREDNISOLONE ACETATE 40 MG/ML IJ SUSP
40.0000 mg | Freq: Once | INTRAMUSCULAR | Status: AC
  Administered 2020-05-25: 40 mg via INTRA_ARTICULAR
  Filled 2020-05-25: qty 1

## 2020-05-25 MED ORDER — ROPIVACAINE HCL 2 MG/ML IJ SOLN
4.0000 mL | Freq: Once | INTRAMUSCULAR | Status: AC
  Administered 2020-05-25: 10 mL via PERINEURAL
  Filled 2020-05-25: qty 10

## 2020-05-25 MED ORDER — ROPIVACAINE HCL 2 MG/ML IJ SOLN
INTRAMUSCULAR | Status: AC
  Filled 2020-05-25: qty 10

## 2020-05-25 MED ORDER — METHYLPREDNISOLONE ACETATE 40 MG/ML IJ SUSP
INTRAMUSCULAR | Status: AC
  Filled 2020-05-25: qty 1

## 2020-05-25 MED ORDER — LIDOCAINE HCL 2 % IJ SOLN
20.0000 mL | Freq: Once | INTRAMUSCULAR | Status: AC
  Administered 2020-05-25: 200 mg

## 2020-05-25 MED ORDER — IOHEXOL 180 MG/ML  SOLN
INTRAMUSCULAR | Status: AC
  Filled 2020-05-25: qty 20

## 2020-05-25 NOTE — Progress Notes (Signed)
Safety precautions to be maintained throughout the outpatient stay will include: orient to surroundings, keep bed in low position, maintain call bell within reach at all times, provide assistance with transfer out of bed and ambulation.  

## 2020-05-25 NOTE — Progress Notes (Signed)
PROVIDER NOTE: Information contained herein reflects review and annotations entered in association with encounter. Interpretation of such information and data should be left to medically-trained personnel. Information provided to patient can be located elsewhere in the medical record under "Patient Instructions". Document created using STT-dictation technology, any transcriptional errors that may result from process are unintentional.    Patient: Kaylee Warner  Service Category: Procedure  Provider: Gillis Santa, MD  DOB: 1945-01-25  DOS: 05/25/2020  Location: [facility]  MRN: [id number]  Setting: Ambulatory - outpatient  Referring Provider: Sandrea Hughs, NP  Type: Established Patient  Specialty: Interventional Pain Management  PCP: Sandrea Hughs, NP   Primary Reason for Visit: Interventional Pain Management Treatment. CC: Shoulder Pain  Procedure:          Anesthesia, Analgesia, Anxiolysis:  Type: Therapeutic Glenohumeral Joint (shoulder) Injection #2  Primary Purpose: Diagnostic Region: Posterior Shoulder Area Level:  Shoulder Target Area: Glenohumeral Joint (shoulder) Approach: Posterior approach. Laterality: Right-Sided  Type: Local Anesthesia  Local Anesthetic: Lidocaine 1-2%  Position: Supine   Indications: 1. Arthropathy of right shoulder   2. Right rotator cuff tear arthropathy   3. Chronic pain syndrome    Pain Score: Pre-procedure: 10-Worst pain ever/10 Post-procedure: 0-No pain/10   Pre-op Assessment:  Kaylee Warner is a 75 y.o. (year old), female patient, seen today for interventional treatment. She  has a past surgical history that includes [place] 2009; Joint replacement (2008); and Colonoscopy. Kaylee Warner has a current medication list which includes the following prescription(s): amlodipine, aspirin ec, cetirizine, vitamin d3, donepezil, fenofibrate, fluticasone, gabapentin, guaifenesin-dextromethorphan, lisinopril, meloxicam, paroxetine, melatonin,  and tizanidine. Her primarily concern today is the Shoulder Pain  Initial Vital Signs:  Pulse/HCG Rate: 86ECG Heart Rate: 88 Temp: (!) 97.1 F (36.2 C) Resp: 18 BP: (!) 156/83 SpO2: 99 %  BMI: Estimated body mass index is 26.88 kg/m as calculated from the following:   Height as of this encounter: 5\' 9"  (1.753 m).   Weight as of this encounter: 182 lb (82.6 kg).  Risk Assessment: Allergies: Reviewed. She is allergic to celecoxib, codeine, crestor [rosuvastatin calcium], fexofenadine, and penicillins.  Allergy Precautions: None required Coagulopathies: Reviewed. None identified.  Blood-thinner therapy: None at this time Active Infection(s): Reviewed. None identified. Kaylee Warner is afebrile  Site Confirmation: Kaylee Warner was asked to confirm the procedure and laterality before marking the site Procedure checklist: Completed Consent: Before the procedure and under the influence of no sedative(s), amnesic(s), or anxiolytics, the patient was informed of the treatment options, risks and possible complications. To fulfill our ethical and legal obligations, as recommended by the American Medical Association's Code of Ethics, I have informed the patient of my clinical impression; the nature and purpose of the treatment or procedure; the risks, benefits, and possible complications of the intervention; the alternatives, including doing nothing; the risk(s) and benefit(s) of the alternative treatment(s) or procedure(s); and the risk(s) and benefit(s) of doing nothing. The patient was provided information about the general risks and possible complications associated with the procedure. These may include, but are not limited to: failure to achieve desired goals, infection, bleeding, organ or nerve damage, allergic reactions, paralysis, and death. In addition, the patient was informed of those risks and complications associated to the procedure, such as failure to decrease pain; infection; bleeding; organ or  nerve damage with subsequent damage to sensory, motor, and/or autonomic systems, resulting in permanent pain, numbness, and/or weakness of one or several areas of the body; allergic reactions; (  i.e.: anaphylactic reaction); and/or death. Furthermore, the patient was informed of those risks and complications associated with the medications. These include, but are not limited to: allergic reactions (i.e.: anaphylactic or anaphylactoid reaction(s)); adrenal axis suppression; blood sugar elevation that in diabetics may result in ketoacidosis or comma; water retention that in patients with history of congestive heart failure may result in shortness of breath, pulmonary edema, and decompensation with resultant heart failure; weight gain; swelling or edema; medication-induced neural toxicity; particulate matter embolism and blood vessel occlusion with resultant organ, and/or nervous system infarction; and/or aseptic necrosis of one or more joints. Finally, the patient was informed that Medicine is not an exact science; therefore, there is also the possibility of unforeseen or unpredictable risks and/or possible complications that may result in a catastrophic outcome. The patient indicated having understood very clearly. We have given the patient no guarantees and we have made no promises. Enough time was given to the patient to ask questions, all of which were answered to the patient's satisfaction. Kaylee Warner has indicated that she wanted to continue with the procedure. Attestation: I, the ordering provider, attest that I have discussed with the patient the benefits, risks, side-effects, alternatives, likelihood of achieving goals, and potential problems during recovery for the procedure that I have provided informed consent. Date  Time: 05/25/2020 10:52 AM  Pre-Procedure Preparation:  Monitoring: As per clinic protocol. Respiration, ETCO2, SpO2, BP, heart rate and rhythm monitor placed and checked for adequate  function Safety Precautions: Patient was assessed for positional comfort and pressure points before starting the procedure. Time-out: I initiated and conducted the "Time-out" before starting the procedure, as per protocol. The patient was asked to participate by confirming the accuracy of the "Time Out" information. Verification of the correct person, site, and procedure were performed and confirmed by me, the nursing staff, and the patient. "Time-out" conducted as per Joint Commission's Universal Protocol (UP.01.01.01). Time: 1135  Description of Procedure:          Area Prepped: Entire shoulder Area DuraPrep (Iodine Povacrylex [0.7% available iodine] and Isopropyl Alcohol, 74% w/w) Safety Precautions: Aspiration looking for blood return was conducted prior to all injections. At no point did we inject any substances, as a needle was being advanced. No attempts were made at seeking any paresthesias. Safe injection practices and needle disposal techniques used. Medications properly checked for expiration dates. SDV (single dose vial) medications used. Description of the Procedure: Protocol guidelines were followed. The patient was placed in position over the procedure table. The target area was identified and the area prepped in the usual manner. Skin & deeper tissues infiltrated with local anesthetic. Appropriate amount of time allowed to pass for local anesthetics to take effect. The procedure needles were then advanced to the target area. Proper needle placement secured. Negative aspiration confirmed. Solution injected in intermittent fashion, asking for systemic symptoms every 0.5cc of injectate. The needles were then removed and the area cleansed, making sure to leave some of the prepping solution back to take advantage of its long term bactericidal properties.         Vitals:   05/25/20 1053 05/25/20 1135  BP: (!) 156/83 137/89  Pulse: 86   Resp: 18 19  Temp: (!) 97.1 F (36.2 C)   TempSrc:  Temporal   SpO2: 99% 100%  Weight: 182 lb (82.6 kg)   Height: 5\' 9"  (1.753 m)     Start Time: 1135 hrs. End Time: 1138 hrs. Materials:  Needle(s) Type: Spinal Needle Gauge:  25G Length: 3.5-in Medication(s): Please see orders for medications and dosing details. 5 cc solution made of 4 cc of 0.2% ropivacaine, 1 cc of methylprednisolone, 40 mg/cc.  Injected into right glenohumeral joint, posterior approach. Imaging Guidance (Non-Spinal):          Type of Imaging Technique: Fluoroscopy Guidance (Non-Spinal) Indication(s): Assistance in needle guidance and placement for procedures requiring needle placement in or near specific anatomical locations not easily accessible without such assistance. Exposure Time: Please see nurses notes. Contrast: Before injecting any contrast, we confirmed that the patient did not have an allergy to iodine, shellfish, or radiological contrast. Once satisfactory needle placement was completed at the desired level, radiological contrast was injected. Contrast injected under live fluoroscopy. No contrast complications. See chart for type and volume of contrast used. Fluoroscopic Guidance: I was personally present during the use of fluoroscopy. "Tunnel Vision Technique" used to obtain the best possible view of the target area. Parallax error corrected before commencing the procedure. "Direction-depth-direction" technique used to introduce the needle under continuous pulsed fluoroscopy. Once target was reached, antero-posterior, oblique, and lateral fluoroscopic projection used confirm needle placement in all planes. Images permanently stored in EMR. Interpretation: I personally interpreted the imaging intraoperatively. Adequate needle placement confirmed in multiple planes. Appropriate spread of contrast into desired area was observed. No evidence of afferent or efferent intravascular uptake. Permanent images saved into the patient's record.  Post-operative Assessment:   Post-procedure Vital Signs:  Pulse/HCG Rate: 8688 Temp: (!) 97.1 F (36.2 C) Resp: 19 BP: 137/89 SpO2: 100 %  EBL: None  Complications: No immediate post-treatment complications observed by team, or reported by patient.  Note: The patient tolerated the entire procedure well. A repeat set of vitals were taken after the procedure and the patient was kept under observation following institutional policy, for this type of procedure. Post-procedural neurological assessment was performed, showing return to baseline, prior to discharge. The patient was provided with post-procedure discharge instructions, including a section on how to identify potential problems. Should any problems arise concerning this procedure, the patient was given instructions to immediately contact us, at any time, without hesitation. In any case, we plan to contact the patient by telephone for a follow-up status report regarding this interventional procedure.  Comments:  No additional relevant information.  Plan of Care  Orders:  Orders Placed This Encounter  Procedures  . [facility] C-ARM 1-60 MIN NO REPORT    Intraoperative interpretation by procedural physician at [facility].    Standing Status:   Standing    Number of Occurrences:   1    Order Specific Question:   Reason for exam:    Answer:   Assistance in needle guidance and placement for procedures requiring needle placement in or near specific anatomical locations not easily accessible without such assistance.   Medications ordered for procedure: Meds ordered this encounter  Medications  . iohexol (OMNIPAQUE) 180 MG/ML injection 10 mL    Must be Myelogram-compatible. If not available, you may substitute with a water-soluble, non-ionic, hypoallergenic, myelogram-compatible radiological contrast medium.  Marland Kitchen lidocaine (XYLOCAINE) 2 % (with pres) injection 400 mg  . ropivacaine (PF) 2 mg/mL (0.2%) (NAROPIN) injection 4 mL  . methylPREDNISolone  acetate (DEPO-MEDROL) injection 40 mg   Medications administered: We administered iohexol, lidocaine, ropivacaine (PF) 2 mg/mL (0.2%), and methylPREDNISolone acetate.  See the medical record for exact dosing, route, and time of administration.  Follow-up plan:   Return in about 5 weeks (around 06/29/2020) for VV  PP.      Right posterior glenohumeral joint injection 10/28/2019, 05/25/20   Recent Visits No visits were found meeting these conditions. Showing recent visits within past 90 days and meeting all other requirements Today's Visits Date Type Provider Dept  05/25/20 Procedure visit Gillis Santa, MD [facility]  Showing today's visits and meeting all other requirements Future Appointments Date Type Provider Dept  07/01/20 Appointment Gillis Santa, MD [facility]  Showing future appointments within next 90 days and meeting all other requirements  Disposition: Discharge home  Discharge (Date  Time): 05/25/2020; 1150 hrs.   Primary Care Physician: Sandrea Hughs, NP Location: [facility] Note by: Gillis Santa, MD Date: 05/25/2020; Time: 11:51 AM  Disclaimer:  Medicine is not an exact science. The only guarantee in medicine is that nothing is guaranteed. It is important to note that the decision to proceed with this intervention was based on the information collected from the patient. The Data and conclusions were drawn from the patient's questionnaire, the interview, and the physical examination. Because the information was provided in large part by the patient, it cannot be guaranteed that it has not been purposely or unconsciously manipulated. Every effort has been made to obtain as much relevant data as possible for this evaluation. It is important to note that the conclusions that lead to this procedure are derived in large part from the available data. Always take into account that the treatment will also be dependent on  availability of resources and existing treatment guidelines, considered by other Pain Management Practitioners as being common knowledge and practice, at the time of the intervention. For Medico-Legal purposes, it is also important to point out that variation in procedural techniques and pharmacological choices are the acceptable norm. The indications, contraindications, technique, and results of the above procedure should only be interpreted and judged by a Board-Certified Interventional Pain Specialist with extensive familiarity and expertise in the same exact procedure and technique.

## 2020-05-25 NOTE — Progress Notes (Signed)
PROVIDER NOTE: Information contained herein reflects review and annotations entered in association with encounter. Interpretation of such information and data should be left to medically-trained personnel. Information provided to patient can be located elsewhere in the medical record under "Patient Instructions". Document created using STT-dictation technology, any transcriptional errors that may result from process are unintentional.    Patient: Kaylee Warner  Service Category: Procedure  Provider: Gillis Santa, MD  DOB: 15-Dec-1945  DOS: 05/25/2020  Location: [facility]  MRN: [id number]  Setting: Ambulatory - outpatient  Referring Provider: Sandrea Hughs, NP  Type: Established Patient  Specialty: Interventional Pain Management  PCP: Sandrea Hughs, NP   Primary Reason for Visit: Interventional Pain Management Treatment. CC: Right knee pain Procedure:          Anesthesia, Analgesia, Anxiolysis:  Type: Therapeutic Intra-Articular Local anesthetic and steroid Knee Injection          Region: Medial infrapatellar Knee Region Level: Knee Joint Laterality: Right knee  Type: Local Anesthesia Indication(s): Analgesia         Local Anesthetic: Lidocaine 1-2% Route: Infiltration ([place]/IM) IV Access: Declined Sedation: Declined   Position: Sitting   Indications: Right knee osteoarthritis   Pain Score: Pre-procedure: 10-Worst pain ever/10 Post-procedure: 0-No pain/10   Pre-op H&P Assessment:  Kaylee Warner is a 75 y.o. (year old), female patient, seen today for interventional treatment. She  has a past surgical history that includes [place] 2009; Joint replacement (2008); and Colonoscopy. Kaylee Warner has a current medication list which includes the following prescription(s): amlodipine, aspirin ec, cetirizine, vitamin d3, donepezil, fenofibrate, fluticasone, gabapentin, guaifenesin-dextromethorphan, lisinopril, meloxicam, paroxetine, melatonin, and tizanidine. Her primarily concern  today is the Shoulder Pain  Initial Vital Signs:  Pulse/HCG Rate: 86ECG Heart Rate: 88 Temp: (!) 97.1 F (36.2 C) Resp: 18 BP: (!) 156/83 SpO2: 99 %  BMI: Estimated body mass index is 26.88 kg/m as calculated from the following:   Height as of this encounter: 5\' 9"  (1.753 m).   Weight as of this encounter: 182 lb (82.6 kg).  Risk Assessment: Allergies: Reviewed. She is allergic to celecoxib, codeine, crestor [rosuvastatin calcium], fexofenadine, and penicillins.  Allergy Precautions: None required Coagulopathies: Reviewed. None identified.  Blood-thinner therapy: None at this time Active Infection(s): Reviewed. None identified. Kaylee Warner is afebrile  Site Confirmation: Kaylee Warner was asked to confirm the procedure and laterality before marking the site Procedure checklist: Completed Consent: Before the procedure and under the influence of no sedative(s), amnesic(s), or anxiolytics, the patient was informed of the treatment options, risks and possible complications. To fulfill our ethical and legal obligations, as recommended by the American Medical Association's Code of Ethics, I have informed the patient of my clinical impression; the nature and purpose of the treatment or procedure; the risks, benefits, and possible complications of the intervention; the alternatives, including doing nothing; the risk(s) and benefit(s) of the alternative treatment(s) or procedure(s); and the risk(s) and benefit(s) of doing nothing. The patient was provided information about the general risks and possible complications associated with the procedure. These may include, but are not limited to: failure to achieve desired goals, infection, bleeding, organ or nerve damage, allergic reactions, paralysis, and death. In addition, the patient was informed of those risks and complications associated to the procedure, such as failure to decrease pain; infection; bleeding; organ or nerve damage with subsequent damage  to sensory, motor, and/or autonomic systems, resulting in permanent pain, numbness, and/or weakness of one or several areas of the  body; allergic reactions; (i.e.: anaphylactic reaction); and/or death. Furthermore, the patient was informed of those risks and complications associated with the medications. These include, but are not limited to: allergic reactions (i.e.: anaphylactic or anaphylactoid reaction(s)); adrenal axis suppression; blood sugar elevation that in diabetics may result in ketoacidosis or comma; water retention that in patients with history of congestive heart failure may result in shortness of breath, pulmonary edema, and decompensation with resultant heart failure; weight gain; swelling or edema; medication-induced neural toxicity; particulate matter embolism and blood vessel occlusion with resultant organ, and/or nervous system infarction; and/or aseptic necrosis of one or more joints. Finally, the patient was informed that Medicine is not an exact science; therefore, there is also the possibility of unforeseen or unpredictable risks and/or possible complications that may result in a catastrophic outcome. The patient indicated having understood very clearly. We have given the patient no guarantees and we have made no promises. Enough time was given to the patient to ask questions, all of which were answered to the patient's satisfaction. Kaylee Warner has indicated that she wanted to continue with the procedure. Attestation: I, the ordering provider, attest that I have discussed with the patient the benefits, risks, side-effects, alternatives, likelihood of achieving goals, and potential problems during recovery for the procedure that I have provided informed consent. Date  Time: 05/25/2020 10:52 AM  Pre-Procedure Preparation:  Monitoring: As per clinic protocol. Respiration, ETCO2, SpO2, BP, heart rate and rhythm monitor placed and checked for adequate function Safety Precautions: Patient  was assessed for positional comfort and pressure points before starting the procedure. Time-out: I initiated and conducted the "Time-out" before starting the procedure, as per protocol. The patient was asked to participate by confirming the accuracy of the "Time Out" information. Verification of the correct person, site, and procedure were performed and confirmed by me, the nursing staff, and the patient. "Time-out" conducted as per Joint Commission's Universal Protocol (UP.01.01.01). Time: 1135  Description of Procedure:          Target Area: Knee Joint Approach: Just above the Lateral tibial plateau, lateral to the infrapatellar tendon. Area Prepped: Entire knee area, from the mid-thigh to the mid-shin. DuraPrep (Iodine Povacrylex [0.7% available iodine] and Isopropyl Alcohol, 74% w/w) Safety Precautions: Aspiration looking for blood return was conducted prior to all injections. At no point did we inject any substances, as a needle was being advanced. No attempts were made at seeking any paresthesias. Safe injection practices and needle disposal techniques used. Medications properly checked for expiration dates. SDV (single dose vial) medications used. Description of the Procedure: Protocol guidelines were followed. The patient was placed in position over the fluoroscopy table. The target area was identified and the area prepped in the usual manner. Skin & deeper tissues infiltrated with local anesthetic. Appropriate amount of time allowed to pass for local anesthetics to take effect. The procedure needles were then advanced to the target area. Proper needle placement secured. Negative aspiration confirmed. Solution injected in intermittent fashion, asking for systemic symptoms every 0.5cc of injectate. The needles were then removed and the area cleansed, making sure to leave some of the prepping solution back to take advantage of its long term bactericidal properties. Vitals:   05/25/20 1053 05/25/20  1135  BP: (!) 156/83 137/89  Pulse: 86   Resp: 18 19  Temp: (!) 97.1 F (36.2 C)   TempSrc: Temporal   SpO2: 99% 100%  Weight: 182 lb (82.6 kg)   Height: 5\' 9"  (1.753 m)  Start Time: 1135 hrs. End Time: 1138 hrs. Materials:  Needle(s) Type: Regular needle Gauge: 25G Length: 1.5-in Medication(s): Please see orders for medications and dosing details.  5 cc solution made of 4 cc of 0.2% ropivacaine, 1 cc of methylprednisolone, 40 mg/cc.  Injected into the right knee.  Post-operative Assessment:  Post-procedure Vital Signs:  Pulse/HCG Rate: 8688 Temp: (!) 97.1 F (36.2 C) Resp: 19 BP: 137/89 SpO2: 100 %  EBL: None  Complications: No immediate post-treatment complications observed by team, or reported by patient.  Note: The patient tolerated the entire procedure well. A repeat set of vitals were taken after the procedure and the patient was kept under observation following institutional policy, for this type of procedure. Post-procedural neurological assessment was performed, showing return to baseline, prior to discharge. The patient was provided with post-procedure discharge instructions, including a section on how to identify potential problems. Should any problems arise concerning this procedure, the patient was given instructions to immediately contact us, at any time, without hesitation. In any case, we plan to contact the patient by telephone for a follow-up status report regarding this interventional procedure.  Comments:  No additional relevant information.  Plan of Care  Orders:  Orders Placed This Encounter  Procedures  . DG PA[facility] C-ARM 1-60 MIN NO REPORT    Intraoperative interpretation by procedural physician at [address].    Standing Status:   Standing    Number of Occurrences:   1    Order Specific Question:   Reason for exam:    Answer:   Assistance in needle guidance and placement for procedures requiring needle placement in or near  specific anatomical locations not easily accessible without such assistance.   Medications ordered for procedure: Meds ordered this encounter  Medications  . iohexol (OMNIPAQUE) 180 MG/ML injection 10 mL    Must be Myelogram-compatible. If not available, you may substitute with a water-soluble, non-ionic, hypoallergenic, myelogram-compatible radiological contrast medium.  Marland Kitchen lidocaine (XYLOCAINE) 2 % (with pres) injection 400 mg  . ropivacaine (PF) 2 mg/mL (0.2%) (NAROPIN) injection 4 mL  . methylPREDNISolone acetate (DEPO-MEDROL) injection 40 mg   Medications administered: We administered iohexol, lidocaine, ropivacaine (PF) 2 mg/mL (0.2%), and methylPREDNISolone acetate.  See the medical record for exact dosing, route, and time of administration.  Follow-up plan:   Return in about 5 weeks (around 06/29/2020) for VV PP.      Right posterior glenohumeral joint injection 10/28/2019: 90% pain relief, repeat as needed      Recent Visits No visits were found meeting these conditions. Showing recent visits within past 90 days and meeting all other requirements Today's Visits Date Type Provider Dept  05/25/20 Procedure visit Gillis Santa, MD [facility]  Showing today's visits and meeting all other requirements Future Appointments Date Type Provider Dept  07/01/20 Appointment Gillis Santa, MD [facility]  Showing future appointments within next 90 days and meeting all other requirements  Disposition: Discharge home  Discharge (Date  Time): 05/25/2020; 1150 hrs.   Primary Care Physician: Sandrea Hughs, NP Location: [facility] Note by: Gillis Santa, MD Date: 05/25/2020; Time: 11:53 AM  Disclaimer:  Medicine is not an exact science. The only guarantee in medicine is that nothing is guaranteed. It is important to note that the decision to proceed with this intervention was based on the information collected from the patient. The Data  and conclusions were drawn from the patient's questionnaire, the interview, and the physical  examination. Because the information was provided in large part by the patient, it cannot be guaranteed that it has not been purposely or unconsciously manipulated. Every effort has been made to obtain as much relevant data as possible for this evaluation. It is important to note that the conclusions that lead to this procedure are derived in large part from the available data. Always take into account that the treatment will also be dependent on availability of resources and existing treatment guidelines, considered by other Pain Management Practitioners as being common knowledge and practice, at the time of the intervention. For Medico-Legal purposes, it is also important to point out that variation in procedural techniques and pharmacological choices are the acceptable norm. The indications, contraindications, technique, and results of the above procedure should only be interpreted and judged by a Board-Certified Interventional Pain Specialist with extensive familiarity and expertise in the same exact procedure and technique.

## 2020-05-25 NOTE — Patient Instructions (Signed)

## 2020-05-26 ENCOUNTER — Telehealth: Payer: Self-pay | Admitting: *Deleted

## 2020-05-26 NOTE — Telephone Encounter (Signed)
No issues post procedure. Doing well and has already been up and to the grocery store this morning. Pain has improved.

## 2020-06-09 ENCOUNTER — Ambulatory Visit: Payer: Medicare Other | Admitting: Family

## 2020-06-12 ENCOUNTER — Other Ambulatory Visit: Payer: Self-pay

## 2020-06-12 DIAGNOSIS — E782 Mixed hyperlipidemia: Secondary | ICD-10-CM

## 2020-06-12 NOTE — Progress Notes (Signed)
Patient cancelled last fasting appointment. Patient has 4 month fasting Lipid Panel scheduled for 06/15/2020 per last result note 02/11/2020.

## 2020-06-15 ENCOUNTER — Other Ambulatory Visit: Payer: Medicare Other

## 2020-06-15 ENCOUNTER — Other Ambulatory Visit: Payer: Self-pay

## 2020-06-15 DIAGNOSIS — E782 Mixed hyperlipidemia: Secondary | ICD-10-CM

## 2020-06-16 LAB — LIPID PANEL
Cholesterol: 244 mg/dL — ABNORMAL HIGH (ref <200)
HDL: 66 mg/dL (ref >=50)
LDL Cholesterol (Calc): 159 mg/dL (calc) — ABNORMAL HIGH
Non-HDL Cholesterol (Calc): 178 mg/dL (calc) — ABNORMAL HIGH (ref <130)
Total CHOL/HDL Ratio: 3.7 (calc) (ref <5.0)
Triglycerides: 89 mg/dL (ref <150)

## 2020-06-23 ENCOUNTER — Encounter: Payer: Self-pay | Admitting: Family

## 2020-06-23 ENCOUNTER — Other Ambulatory Visit: Payer: Self-pay

## 2020-06-23 ENCOUNTER — Ambulatory Visit (INDEPENDENT_AMBULATORY_CARE_PROVIDER_SITE_OTHER): Payer: Medicare Other | Admitting: Family

## 2020-06-23 VITALS — BP 140/90 | HR 73 | Temp 97.1°F | Resp 16 | Ht 69.0 in | Wt 183.6 lb

## 2020-06-23 DIAGNOSIS — G894 Chronic pain syndrome: Secondary | ICD-10-CM | POA: Diagnosis not present

## 2020-06-23 DIAGNOSIS — Z789 Other specified health status: Secondary | ICD-10-CM

## 2020-06-23 DIAGNOSIS — E782 Mixed hyperlipidemia: Secondary | ICD-10-CM

## 2020-06-23 DIAGNOSIS — I10 Essential (primary) hypertension: Secondary | ICD-10-CM

## 2020-06-23 MED ORDER — MELOXICAM 15 MG PO TABS: 15.0000 mg | ORAL_TABLET | Freq: Every day | ORAL | 1 refills | Status: DC

## 2020-06-23 MED ORDER — LISINOPRIL 40 MG PO TABS: 40.0000 mg | ORAL_TABLET | Freq: Every day | ORAL | 1 refills | Status: DC
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
## 2020-06-23 NOTE — Patient Instructions (Signed)
https://www.nhlbi.nih.gov/files/docs/public/heart/dash_brief.pdf">  DASH Eating Plan DASH stands for Dietary Approaches to Stop Hypertension. The DASH eating plan is a healthy eating plan that has been shown to: Reduce high blood pressure (hypertension). Reduce your risk for type 2 diabetes, heart disease, and stroke. Help with weight loss. What are tips for following this plan? Reading food labels Check food labels for the amount of salt (sodium) per serving. Choose foods with less than 5 percent of the Daily Value of sodium. Generally, foods with less than 300 milligrams (mg) of sodium per serving fit into this eating plan. To find whole grains, look for the word "whole" as the first word in the ingredient list. Shopping Buy products labeled as "low-sodium" or "no salt added." Buy fresh foods. Avoid canned foods and pre-made or frozen meals. Cooking Avoid adding salt when cooking. Use salt-free seasonings or herbs instead of table salt or sea salt. Check with your health care provider or pharmacist before using salt substitutes. Do not fry foods. Cook foods using healthy methods such as baking, boiling, grilling, roasting, and broiling instead. Cook with heart-healthy oils, such as olive, canola, avocado, soybean, or sunflower oil. Meal planning  Eat a balanced diet that includes: 4 or more servings of fruits and 4 or more servings of vegetables each day. Try to fill one-half of your plate with fruits and vegetables. 6-8 servings of whole grains each day. Less than 6 oz (170 g) of lean meat, poultry, or fish each day. A 3-oz (85-g) serving of meat is about the same size as a deck of cards. One egg equals 1 oz (28 g). 2-3 servings of low-fat dairy each day. One serving is 1 cup (237 mL). 1 serving of nuts, seeds, or beans 5 times each week. 2-3 servings of heart-healthy fats. Healthy fats called omega-3 fatty acids are found in foods such as walnuts, flaxseeds, fortified milks, and eggs.  These fats are also found in cold-water fish, such as sardines, salmon, and mackerel. Limit how much you eat of: Canned or prepackaged foods. Food that is high in trans fat, such as some fried foods. Food that is high in saturated fat, such as fatty meat. Desserts and other sweets, sugary drinks, and other foods with added sugar. Full-fat dairy products. Do not salt foods before eating. Do not eat more than 4 egg yolks a week. Try to eat at least 2 vegetarian meals a week. Eat more home-cooked food and less restaurant, buffet, and fast food.  Lifestyle When eating at a restaurant, ask that your food be prepared with less salt or no salt, if possible. If you drink alcohol: Limit how much you use to: 0-1 drink a day for women who are not pregnant. 0-2 drinks a day for men. Be aware of how much alcohol is in your drink. In the [place] one drink equals one 12 oz bottle of beer (355 mL), one 5 oz glass of wine (148 mL), or one 1 oz glass of hard liquor (44 mL). General information Avoid eating more than 2,300 mg of salt a day. If you have hypertension, you may need to reduce your sodium intake to 1,500 mg a day. Work with your health care provider to maintain a healthy body weight or to lose weight. Ask what an ideal weight is for you. Get at least 30 minutes of exercise that causes your heart to beat faster (aerobic exercise) most days of the week. Activities may include walking, swimming, or biking. Work with your health care provider   or dietitian to adjust your eating plan to your individual calorie needs. What foods should I eat? Fruits All fresh, dried, or frozen fruit. Canned fruit in natural juice (without addedsugar). Vegetables Fresh or frozen vegetables (raw, steamed, roasted, or grilled). Low-sodium or reduced-sodium tomato and vegetable juice. Low-sodium or reduced-sodium tomatosauce and tomato paste. Low-sodium or reduced-sodium canned vegetables. Grains Whole-grain or  whole-wheat bread. Whole-grain or whole-wheat pasta. Brown rice. Oatmeal. Quinoa. Bulgur. Whole-grain and low-sodium cereals. Pita bread.Low-fat, low-sodium crackers. Whole-wheat flour tortillas. Meats and other proteins Skinless chicken or turkey. Ground chicken or turkey. Pork with fat trimmed off. Fish and seafood. Egg whites. Dried beans, peas, or lentils. Unsalted nuts, nut butters, and seeds. Unsalted canned beans. Lean cuts of beef with fat trimmed off. Low-sodium, lean precooked or cured meat, such as sausages or meatloaves. Dairy Low-fat (1%) or fat-free (skim) milk. Reduced-fat, low-fat, or fat-free cheeses. Nonfat, low-sodium ricotta or cottage cheese. Low-fat or nonfatyogurt. Low-fat, low-sodium cheese. Fats and oils Soft margarine without trans fats. Vegetable oil. Reduced-fat, low-fat, or light mayonnaise and salad dressings (reduced-sodium). Canola, safflower, olive, avocado, soybean, andsunflower oils. Avocado. Seasonings and condiments Herbs. Spices. Seasoning mixes without salt. Other foods Unsalted popcorn and pretzels. Fat-free sweets. The items listed above may not be a complete list of foods and beverages you can eat. Contact a dietitian for more information. What foods should I avoid? Fruits Canned fruit in a light or heavy syrup. Fried fruit. Fruit in cream or buttersauce. Vegetables Creamed or fried vegetables. Vegetables in a cheese sauce. Regular canned vegetables (not low-sodium or reduced-sodium). Regular canned tomato sauce and paste (not low-sodium or reduced-sodium). Regular tomato and vegetable juice(not low-sodium or reduced-sodium). Pickles. Olives. Grains Baked goods made with fat, such as croissants, muffins, or some breads. Drypasta or rice meal packs. Meats and other proteins Fatty cuts of meat. Ribs. Fried meat. Bacon. Bologna, salami, and other precooked or cured meats, such as sausages or meat loaves. Fat from the back of a pig (fatback). Bratwurst.  Salted nuts and seeds. Canned beans with added salt. Canned orsmoked fish. Whole eggs or egg yolks. Chicken or turkey with skin. Dairy Whole or 2% milk, cream, and half-and-half. Whole or full-fat cream cheese. Whole-fat or sweetened yogurt. Full-fat cheese. Nondairy creamers. Whippedtoppings. Processed cheese and cheese spreads. Fats and oils Butter. Stick margarine. Lard. Shortening. Ghee. Bacon fat. Tropical oils, suchas coconut, palm kernel, or palm oil. Seasonings and condiments Onion salt, garlic salt, seasoned salt, table salt, and sea salt. Worcestershire sauce. Tartar sauce. Barbecue sauce. Teriyaki sauce. Soy sauce, including reduced-sodium. Steak sauce. Canned and packaged gravies. Fish sauce. Oyster sauce. Cocktail sauce. Store-bought horseradish. Ketchup. Mustard. Meat flavorings and tenderizers. Bouillon cubes. Hot sauces. Pre-made or packaged marinades. Pre-made or packaged taco seasonings. Relishes. Regular saladdressings. Other foods Salted popcorn and pretzels. The items listed above may not be a complete list of foods and beverages you should avoid. Contact a dietitian for more information. Where to find more information National Heart, Lung, and Blood Institute: www.nhlbi.nih.gov American Heart Association: www.heart.org Academy of Nutrition and Dietetics: www.eatright.org National Kidney Foundation: www.kidney.org Summary The DASH eating plan is a healthy eating plan that has been shown to reduce high blood pressure (hypertension). It may also reduce your risk for type 2 diabetes, heart disease, and stroke. When on the DASH eating plan, aim to eat more fresh fruits and vegetables, whole grains, lean proteins, low-fat dairy, and heart-healthy fats. With the DASH eating plan, you should limit salt (sodium) intake to 2,300   mg a day. If you have hypertension, you may need to reduce your sodium intake to 1,500 mg a day. Work with your health care provider or dietitian to adjust  your eating plan to your individual calorie needs. This information is not intended to replace advice given to you by your health care provider. Make sure you discuss any questions you have with your healthcare provider. Document Revised: 11/30/2018 Document Reviewed: 11/30/2018 Elsevier Patient Education  2022 Elsevier Inc.  

## 2020-06-24 ENCOUNTER — Other Ambulatory Visit: Payer: Self-pay | Admitting: Student in an Organized Health Care Education/Training Program

## 2020-06-24 DIAGNOSIS — G894 Chronic pain syndrome: Secondary | ICD-10-CM

## 2020-06-28 DIAGNOSIS — Z789 Other specified health status: Secondary | ICD-10-CM | POA: Insufficient documentation

## 2020-06-29 ENCOUNTER — Telehealth: Payer: Self-pay | Admitting: *Deleted

## 2020-06-29 DIAGNOSIS — G894 Chronic pain syndrome: Secondary | ICD-10-CM

## 2020-06-29 MED ORDER — GABAPENTIN 300 MG PO CAPS: 300.0000 mg | ORAL_CAPSULE | Freq: Every day | ORAL | 1 refills | Status: DC | PRN

## 2020-06-29 MED ORDER — DOXYCYCLINE HYCLATE 100 MG PO TABS: 100.0000 mg | ORAL_TABLET | Freq: Two times a day (BID) | ORAL | 0 refills | Status: DC

## 2020-06-29 NOTE — Telephone Encounter (Signed)
Patient notified and agreed.  Mediation list updated and Rx sent to Pharmacy.   Patient also asked for a refill on her Gabapentin.

## 2020-06-29 NOTE — Telephone Encounter (Signed)
Recommend tablet verse cream due to redness and feeling warm.take Doxycycline 100 mg tablet one by mouth twice daily x 7 days.

## 2020-06-29 NOTE — Telephone Encounter (Signed)
Patient called and stated that she was seen on 6/14 and Dinah looked at a scratch on Right ankle and it is not healing. Stated that it is red around it and warm to touch. No Streaking. Stated that it has been 2 weeks and no better.   Patient is requesting an antibiotic cream to put on it.   Please Advise.

## 2020-06-30 ENCOUNTER — Encounter: Payer: Self-pay | Admitting: Student in an Organized Health Care Education/Training Program

## 2020-07-01 ENCOUNTER — Ambulatory Visit
Payer: Medicare Other | Attending: Student in an Organized Health Care Education/Training Program | Admitting: Student in an Organized Health Care Education/Training Program

## 2020-07-01 ENCOUNTER — Encounter: Payer: Self-pay | Admitting: Student in an Organized Health Care Education/Training Program

## 2020-07-01 ENCOUNTER — Other Ambulatory Visit: Payer: Self-pay

## 2020-07-01 DIAGNOSIS — M1711 Unilateral primary osteoarthritis, right knee: Secondary | ICD-10-CM

## 2020-07-01 DIAGNOSIS — G894 Chronic pain syndrome: Secondary | ICD-10-CM

## 2020-07-01 NOTE — Progress Notes (Signed)
Patient: Kaylee Warner  Service Category: E/M  Provider: Gillis Santa, MD  DOB: 1945/03/29  DOS: 07/01/2020  Location: Office  MRN: [id number]  Setting: Ambulatory outpatient  Referring Provider: Sandrea Hughs, NP  Type: Established Patient  Specialty: Interventional Pain Management  PCP: Sandrea Hughs, NP  Location: Home  Delivery: TeleHealth     Virtual Encounter - Pain Management PROVIDER NOTE: Information contained herein reflects review and annotations entered in association with encounter. Interpretation of such information and data should be left to medically-trained personnel. Information provided to patient can be located elsewhere in the medical record under "Patient Instructions". Document created using STT-dictation technology, any transcriptional errors that may result from process are unintentional.    Contact & Pharmacy Preferred: [phone] Home: [phone] (home) Mobile: [phone] (mobile) E-mail: [name] [address] [place] [address] [address] [place] Phone: [phone] Fax: [phone]   Pre-screening  Kaylee Warner offered "in-person" vs "virtual" encounter. She indicated preferring virtual for this encounter.   Reason COVID-19*  Social distancing based on CDC and AMA recommendations.   I contacted [phone] on 07/01/2020 via video conference.      I clearly identified myself as 07/14/2020, MD. I verified that I was speaking with the correct person using two identifiers (Name: Kaylee Warner, and date of birth: 12/15/1945).  Consent I sought verbal advanced consent from 04/16/1945 for virtual visit interactions. I informed Kaylee Warner of possible security and privacy concerns, risks, and limitations associated with providing "not-in-person" medical evaluation and management services. I also informed Kaylee Warner of the availability of "in-person" appointments. Finally, I informed her that there would  be a charge for the virtual visit and that she could be  personally, fully or partially, financially responsible for it. Kaylee Warner expressed understanding and agreed to proceed.   Historic Elements   Kaylee Warner is a 75 y.o. year old, female patient evaluated today after our last contact on 06/24/2020. Kaylee Warner  has a past medical history of Allergy, Anemia, Arthritis, and Hypertension. She also  has a past surgical history that includes [place] 2009; Joint replacement (2008); and Colonoscopy. Kaylee Warner has a current medication list which includes the following prescription(s): amlodipine, aspirin ec, cetirizine, vitamin d3, donepezil, doxycycline, fenofibrate, fluticasone, gabapentin, lisinopril, meloxicam, paroxetine, and tizanidine. She  reports that she has never smoked. She has never used smokeless tobacco. She reports current alcohol use of about 7.0 standard drinks of alcohol per week. She reports that she does not use drugs. Kaylee Warner is allergic to celecoxib, codeine, crestor [rosuvastatin calcium], fexofenadine, and penicillins.   HPI  Today, she is being contacted for a post-procedure assessment.   Post-Procedure Evaluation  Procedure (05/25/2020):   Type: Therapeutic Intra-Articular Local anesthetic and steroid Knee Injection          Region: Medial infrapatellar Knee Region Level: Knee Joint Laterality: Right knee  Sedation: Please see nurses note.  Effectiveness during initial hour after procedure(Ultra-Short Term Relief): 100 %   Local anesthetic used: Long-acting (4-6 hours) Effectiveness: Defined as any analgesic benefit obtained secondary to the administration of local anesthetics. This carries significant diagnostic value as to the etiological location, or anatomical origin, of the pain. Duration of benefit is expected to coincide with the duration of the local anesthetic used.  Effectiveness during initial 4-6 hours after procedure(Short-Term Relief): 100 %   Long-term  benefit: Defined as any relief past  the pharmacologic duration of the local anesthetics.  Effectiveness past the initial 6 hours after procedure(Long-Term Relief): 70 %   Current benefits: Defined as benefit that persist at this time.   Analgesia:   65-70% Function: Kaylee Warner reports improvement in function ROM: Kaylee Warner reports improvement in ROM     UDS:  Summary  Date Value Ref Range Status  10/15/2019 Note  Final    Comment:    ==================================================================== Compliance Drug Analysis, Ur ==================================================================== Test                             Result       Flag       Units  Drug Present   Desmethylcyclobenzaprine       PRESENT    Desmethylcyclobenzaprine is an expected metabolite of    cyclobenzaprine.    Paroxetine                     PRESENT   Acetaminophen                  PRESENT ==================================================================== Test                      Result    Flag   Units      Ref Range   Creatinine              216              mg/dL      >=20 ==================================================================== Declared Medications:  Medication list was not provided. ==================================================================== For clinical consultation, please call [phone]. ====================================================================     Laboratory Chemistry Profile   Renal Lab Results  Component Value Date   BUN 14 05/08/2019   CREATININE 0.56 (L) 05/08/2019   BCR 25 (H) 05/08/2019   GFRAA 106 05/08/2019   GFRNONAA 92 05/08/2019     Hepatic Lab Results  Component Value Date   AST 13 05/08/2019   ALT 10 05/08/2019   ALBUMIN 4.0 12/16/2015   ALKPHOS 53 12/16/2015     Electrolytes Lab Results  Component Value Date   NA 142 05/08/2019   K 4.3 05/08/2019   CL 109 05/08/2019   CALCIUM 9.2 05/08/2019     Bone No  results found for: VD25OH, VD125OH2TOT, GN0037CW8, GQ9169IH0, 25OHVITD1, 25OHVITD2, 25OHVITD3, TESTOFREE, TESTOSTERONE   Inflammation (CRP: Acute Phase) (ESR: Chronic Phase) No results found for: CRP, ESRSEDRATE, LATICACIDVEN     Note: Above Lab results reviewed.   Assessment  The primary encounter diagnosis was Primary osteoarthritis of right knee (hx of right knee arthroscopic surgery). A diagnosis of Chronic pain syndrome was also pertinent to this visit.  Plan of Care    Kaylee Warner has a current medication list which includes the following long-term medication(s): amlodipine, cetirizine, donepezil, fluticasone, gabapentin, and lisinopril.   Patient is doing well after her right knee intra-articular steroid injection.  She states that she has less pain in her knee and is able to ambulate more comfortably.  She states that her right shoulder is also doing well from her previous shoulder steroid injection.  I have instructed her to follow-up should she need a repeat knee or shoulder injection given increased pain.  Patient endorsed understanding.  Orders:  Orders Placed This Encounter  Procedures   KNEE INJECTION    Local Anesthetic & Steroid injection.    Standing Status:  Standing    Number of Occurrences:   1    Standing Expiration Date:   12/31/2020    Scheduling Instructions:     Side: RIGHT     Sedation: None     Timeframe: PRN    Order Specific Question:   Where will this procedure be performed?    Answer:   [facility]    Follow-up plan:   No follow-ups on file.     Right posterior glenohumeral joint injection 10/28/2019: 90% pain relief, repeat as needed.  Right knee intra-articular steroid injection 05/25/2020: 75% pain relief.  Repeat as needed.     Recent Visits Date Type Provider Dept  05/25/20 Procedure visit Gillis Santa, MD [facility]  Showing recent visits within past 90 days and meeting all other requirements Today's  Visits Date Type Provider Dept  07/01/20 Telemedicine Gillis Santa, MD [facility]  Showing today's visits and meeting all other requirements Future Appointments No visits were found meeting these conditions. Showing future appointments within next 90 days and meeting all other requirements I discussed the assessment and treatment plan with the patient. The patient was provided an opportunity to ask questions and all were answered. The patient agreed with the plan and demonstrated an understanding of the instructions.  Patient advised to call back or seek an in-person evaluation if the symptoms or condition worsens.  Duration of encounter: 20 minutes.  Note by: Gillis Santa, MD Date: 07/01/2020; Time: 11:54 AM

## 2020-07-06 ENCOUNTER — Telehealth: Payer: Self-pay | Admitting: Student in an Organized Health Care Education/Training Program

## 2020-07-06 NOTE — Telephone Encounter (Signed)
Patient called asking for refill on tizanidine. She had VV with Dr. Holley Raring on 07-01-20. Can this be filled without an appt. ?

## 2020-07-07 ENCOUNTER — Telehealth: Payer: Self-pay

## 2020-07-07 NOTE — Telephone Encounter (Signed)
Pt would like someone to follow up with her she states she keeps going numb and muscle spasms when she sits down, what are some alternatives she can try to help ease her pain and to get feeling well.

## 2020-07-15 ENCOUNTER — Telehealth: Payer: Self-pay

## 2020-07-15 ENCOUNTER — Encounter: Payer: Self-pay | Admitting: Nurse Practitioner

## 2020-07-15 ENCOUNTER — Ambulatory Visit (INDEPENDENT_AMBULATORY_CARE_PROVIDER_SITE_OTHER): Payer: Medicare Other | Admitting: Nurse Practitioner

## 2020-07-15 ENCOUNTER — Other Ambulatory Visit: Payer: Self-pay

## 2020-07-15 DIAGNOSIS — E2839 Other primary ovarian failure: Secondary | ICD-10-CM

## 2020-07-15 DIAGNOSIS — Z0000 Encounter for general adult medical examination without abnormal findings: Secondary | ICD-10-CM | POA: Diagnosis not present

## 2020-07-15 MED ORDER — TIZANIDINE HCL 4 MG PO TABS: 4.0000 mg | ORAL_TABLET | Freq: Two times a day (BID) | ORAL | 2 refills | Status: DC | PRN

## 2020-07-15 NOTE — Telephone Encounter (Signed)
Called patient, no answer. Left message to call us back so we could get more information on which medication she is referring too.

## 2020-07-15 NOTE — Telephone Encounter (Signed)
Pt needs refill for nerve medicine.

## 2020-07-15 NOTE — Telephone Encounter (Signed)
Called patient and she states that she needs a refill on Tizanidine 4mg . She does not have a MM appt but is scheduling an appt for a knee injection. Would you like to do a MM before  you prescribe . Please let me know.

## 2020-07-15 NOTE — Progress Notes (Signed)
Subjective:   Kaylee Warner is a 75 y.o. female who presents for Medicare Annual (Subsequent) preventive examination.  Review of Systems     Cardiac Risk Factors include: advanced age (>59men, >12 women);hypertension     Objective:    There were no vitals filed for this visit. There is no height or weight on file to calculate BMI.  Advanced Directives 06/23/2020 05/25/2020 02/11/2020 12/23/2019 12/12/2019 10/15/2019 09/10/2019  Does Patient Have a Medical Advance Directive? No No No No No No No  Type of Advance Directive - - - - - - -  Copy of Healthcare Power of Attorney in Chart? - - - - - - -  Would patient like information on creating a medical advance directive? No - Patient declined No - Patient declined No - Patient declined No - Patient declined No - Patient declined No - Patient declined No - Patient declined    Current Medications (verified) Outpatient Encounter Medications as of 07/15/2020  Medication Sig   amLODipine (NORVASC) 10 MG tablet Take 1/2 (one-half) tablet by mouth once daily   aspirin EC 81 MG tablet Take 1 tablet (81 mg total) by mouth daily.   cetirizine (ZYRTEC) 10 MG tablet Take 1 tablet (10 mg total) by mouth daily.   Cholecalciferol (VITAMIN D3) 125 MCG (5000 UT) CAPS Take 1 capsule (5,000 Units total) by mouth every morning.   donepezil (ARICEPT) 10 MG tablet TAKE 1 TABLET BY MOUTH AT BEDTIME   fluticasone (FLONASE) 50 MCG/ACT nasal spray Use 2 spray(s) in each nostril once daily   gabapentin (NEURONTIN) 300 MG capsule Take 1 capsule (300 mg total) by mouth daily as needed.   lisinopril (ZESTRIL) 40 MG tablet Take 1 tablet (40 mg total) by mouth daily.   meloxicam (MOBIC) 15 MG tablet Take 1 tablet (15 mg total) by mouth daily.   PARoxetine (PAXIL) 30 MG tablet Take 30 mg by mouth daily.   tiZANidine (ZANAFLEX) 4 MG tablet Take 4 mg by mouth 2 (two) times daily as needed.   [DISCONTINUED] doxycycline (VIBRA-TABS) 100 MG tablet Take 1 tablet (100 mg total)  by mouth 2 (two) times daily.   [DISCONTINUED] fenofibrate (TRICOR) 145 MG tablet Take 72.5 mg by mouth daily. (Patient not taking: Reported on 07/15/2020)   No facility-administered encounter medications on file as of 07/15/2020.    Allergies (verified) Celecoxib, Codeine, Crestor [rosuvastatin calcium], Fexofenadine, and Penicillins   History: Past Medical History:  Diagnosis Date   Allergy    Anemia    Arthritis    Hypertension    Past Surgical History:  Procedure Laterality Date   COLONOSCOPY     JOINT REPLACEMENT  2008   [place] 2009     Dr. Jolee Ewing    Family History  Problem Relation Age of Onset   Diabetes Sister    Sjogren's syndrome Daughter    Diabetes Son    Hypertension Son    Diabetes Son    Diabetes Other        family history    Arthritis Other        family history    Colon polyps Neg Hx    Colon cancer Neg Hx    Esophageal cancer Neg Hx    Stomach cancer Neg Hx    Rectal cancer Neg Hx    Social History   Socioeconomic History   Marital status: Married    Spouse name: Not on file   Number of children: Not on file  Years of education: Not on file   Highest education level: Not on file  Occupational History   Occupation: cook   Tobacco Use   Smoking status: Never   Smokeless tobacco: Never  Vaping Use   Vaping Use: Never used  Substance and Sexual Activity   Alcohol use: Yes    Alcohol/week: 7.0 standard drinks    Types: 7 Glasses of wine per week    Comment: 2 gass of wine every day   Drug use: No   Sexual activity: Never  Other Topics Concern   Not on file  Social History Narrative   Diet:Regular   Do you drink/eat things with caffeine? Yes   Marital status:  Married                             What year were you married?    Do you live in a house, apartment, assisted living, condo, trailer, etc)? House   Is it one or more stories? 2   How many persons live in your home? 2  Do you have any pets in your home? No   Current or past  profession: Regulatory affairs officer   Do you exercise?   Yes                                                  Type & how often: stretches   Do you have a living will? No    Do you have a DNR Form?  No   Do you have a POA/HPOA forms? No   Social Determinants of Radio broadcast assistant Strain: Not on file  Food Insecurity: Not on file  Transportation Needs: Not on file  Physical Activity: Not on file  Stress: Not on file  Social Connections: Not on file    Tobacco Counseling Counseling given: Not Answered   Clinical Intake:  Pre-visit preparation completed: Yes  Pain : No/denies pain     BMI - recorded: 27 Nutritional Status: BMI 25 -29 Overweight Diabetes: No  How often do you need to have someone help you when you read instructions, pamphlets, or other written materials from your doctor or pharmacy?: 4 - Often What is the last grade level you completed in school?: 12th grade  Diabetic?no         Activities of Daily Living In your present state of health, do you have any difficulty performing the following activities: 07/15/2020  Hearing? N  Vision? N  Difficulty concentrating or making decisions? Y  Walking or climbing stairs? N  Dressing or bathing? N  Doing errands, shopping? N  Preparing Food and eating ? N  Using the Toilet? N  In the past six months, have you accidently leaked urine? N  Do you have problems with loss of bowel control? N  Managing your Medications? N  Managing your Finances? N  Housekeeping or managing your Housekeeping? N  Some recent data might be hidden    Patient Care Team: Ngetich, Nelda Bucks, NP as PCP - General (Family Medicine)  Indicate any recent Medical Services you may have received from other than [facility] providers in the past year (date may be approximate).     Assessment:   This is a routine wellness examination for Kaylee Warner.  Hearing/Vision screen Hearing Screening -  Comments:: No hearing issues  Vision Screening -  Comments:: Last eye exam was March 2022  Dietary issues and exercise activities discussed: Current Exercise Habits: The patient does not participate in regular exercise at present   Goals Addressed   None    Depression Screen PHQ 2/9 Scores 07/15/2020 05/25/2020 10/15/2019 07/09/2019 04/29/2019 06/27/2018 01/22/2018  PHQ - 2 Score 0 0 0 0 0 0 0    Fall Risk Fall Risk  07/15/2020 06/23/2020 05/25/2020 02/11/2020 12/23/2019  Falls in the past year? 0 0 1 1 0  Number falls in past yr: 0 0 0 0 0  Comment - - - - -  Injury with Fall? 0 0 1 1 0  Comment - - knee pain - -  Risk for fall due to : No Fall Risks - History of fall(s) - -  Follow up Falls evaluation completed - - - -    FALL RISK PREVENTION PERTAINING TO THE HOME:  Any stairs in or around the home? Yes  If so, are there any without handrails? No  Home free of loose throw rugs in walkways, pet beds, electrical cords, etc? Yes  Adequate lighting in your home to reduce risk of falls? Yes   ASSISTIVE DEVICES UTILIZED TO PREVENT FALLS:  Life alert? No  Use of a cane, walker or w/c? No  Grab bars in the bathroom? No  Shower chair or bench in shower? No  Elevated toilet seat or a handicapped toilet? No   TIMED UP AND GO:  Was the test performed? No .    Cognitive Function: MMSE - Mini Mental State Exam 06/27/2018 12/15/2016 12/16/2015 05/01/2015  Not completed: - - - (No Data)  Orientation to time 5 3 5 4   Orientation to Place 5 5 5 4   Registration 3 3 3 3   Attention/ Calculation 5 5 5 5   Recall 3 2 3 3   Language- name 2 objects 2 2 2 2   Language- repeat 1 1 1 1   Language- follow 3 step command 3 3 3 3   Language- read & follow direction 1 1 1 1   Write a sentence 1 1 1 1   Copy design 1 1 1 1   Total score 30 27 30 28      6CIT Screen 07/15/2020 07/09/2019  What Year? 0 points 0 points  What month? 0 points 0 points  What time? 0 points 0 points  Count back from 20 0 points 0 points  Months in reverse 4 points 4 points  Repeat  phrase 4 points 6 points  Total Score 8 10    Immunizations Immunization History  Administered Date(s) Administered   Fluad Quad(high Dose 65+) 10/29/2018   Influenza, High Dose Seasonal PF 11/24/2017   Influenza,inj,Quad PF,6+ Mos 09/24/2015, 12/15/2016   Moderna Sars-Covid-2 Vaccination 03/29/2019, 04/26/2019, 11/23/2019, 12/17/2019   Pneumococcal Conjugate-13 12/16/2015   Pneumococcal Polysaccharide-23 06/27/2018   Td 01/17/2017   Tdap 11/23/2010    TDAP status: Up to date  Flu Vaccine status: Up to date  Pneumococcal vaccine status: Up to date  Covid-19 vaccine status: Completed vaccines  Qualifies for Shingles Vaccine? Yes   Zostavax completed No   Shingrix Completed?: No.    Education has been provided regarding the importance of this vaccine. Patient has been advised to call insurance company to determine out of pocket expense if they have not yet received this vaccine. Advised may also receive vaccine at local pharmacy or Health Dept. Verbalized acceptance and understanding.  Screening Tests Health  Maintenance  Topic Date Due   Zoster Vaccines- Shingrix (1 of 2) Never done   COVID-19 Vaccine (5 - Booster for Moderna series) 04/16/2020   INFLUENZA VACCINE  08/10/2020   COLONOSCOPY (Pts 45-21yrs Insurance coverage will need to be confirmed)  10/17/2021   TETANUS/TDAP  01/18/2027   DEXA SCAN  Completed   Hepatitis C Screening  Completed   PNA vac Low Risk Adult  Completed   HPV VACCINES  Aged Out    Health Maintenance  Health Maintenance Due  Topic Date Due   Zoster Vaccines- Shingrix (1 of 2) Never done   COVID-19 Vaccine (5 - Booster for Moderna series) 04/16/2020    Colorectal cancer screening: Type of screening: Colonoscopy. Completed 2018. Repeat every 5 years  Mammogram status: Completed 12/31/19. Repeat every year  Bone Density status: Ordered today. Pt provided with contact info and advised to call to schedule appt.  Lung Cancer Screening: (Low  Dose CT Chest recommended if Age 57-80 years, 30 pack-year currently smoking OR have quit w/in 15years.) does not qualify.   Lung Cancer Screening Referral: na  Additional Screening:  Hepatitis C Screening: does qualify; Completed   Vision Screening: Recommended annual ophthalmology exams for early detection of glaucoma and other disorders of the eye. Is the patient up to date with their annual eye exam?  Yes  Who is the provider or what is the name of the office in which the patient attends annual eye exams? Dr Jeneen Rinks If pt is not established with a provider, would they like to be referred to a provider to establish care? No .   Dental Screening: Recommended annual dental exams for proper oral hygiene  Community Resource Referral / Chronic Care Management: CRR required this visit?  No   CCM required this visit?  No      Plan:     I have personally reviewed and noted the following in the patient's chart:   Medical and social history Use of alcohol, tobacco or illicit drugs  Current medications and supplements including opioid prescriptions.  Functional ability and status Nutritional status Physical activity Advanced directives List of other physicians Hospitalizations, surgeries, and ER visits in previous 12 months Vitals Screenings to include cognitive, depression, and falls Referrals and appointments  In addition, I have reviewed and discussed with patient certain preventive protocols, quality metrics, and best practice recommendations. A written personalized care plan for preventive services as well as general preventive health recommendations were provided to patient.     Lauree Chandler, NP   07/15/2020    Virtual Visit via Telephone Note  I connected withNAME@ on 07/15/20 at  1:00 PM EDT by telephone and verified that I am speaking with the correct person using two identifiers.  Location: Patient: home Provider: [facility]   I discussed the limitations, risks,  security and privacy concerns of performing an evaluation and management service by telephone and the availability of in person appointments. I also discussed with the patient that there may be a patient responsible charge related to this service. The patient expressed understanding and agreed to proceed.   I discussed the assessment and treatment plan with the patient. The patient was provided an opportunity to ask questions and all were answered. The patient agreed with the plan and demonstrated an understanding of the instructions.   The patient was advised to call back or seek an in-person evaluation if the symptoms worsen or if the condition fails to improve as anticipated.  I provided 15  minutes of non-face-to-face time during this encounter.  Carlos American. Harle Battiest Avs printed and mailed

## 2020-07-15 NOTE — Patient Instructions (Addendum)
Kaylee Warner , Thank you for taking time to come for your Medicare Wellness Visit. I appreciate your ongoing commitment to your health goals. Please review the following plan we discussed and let me know if I can assist you in the future.   Screening recommendations/referrals: Colonoscopy up to date Mammogram up to date Bone Density recommended to call [facility] imaging to schedule [phone] Recommended yearly ophthalmology/optometry visit for glaucoma screening and checkup Recommended yearly dental visit for hygiene and checkup  Vaccinations: Influenza vaccine to get annually  Pneumococcal vaccine up to date Tdap vaccine up to date Shingles vaccine RECOMMENDED -   to get at your local pharmacy   Advanced directives: recommend to complete and place on file.   Conditions/risks identified: risk for progressive memory loss, advanced age.  Next appointment: 1 year for AWV   Preventive Care 72 Years and Older, Female Preventive care refers to lifestyle choices and visits with your health care provider that can promote health and wellness. What does preventive care include? A yearly physical exam. This is also called an annual well check. Dental exams once or twice a year. Routine eye exams. Ask your health care provider how often you should have your eyes checked. Personal lifestyle choices, including: Daily care of your teeth and gums. Regular physical activity. Eating a healthy diet. Avoiding tobacco and drug use. Limiting alcohol use. Practicing safe sex. Taking low-dose aspirin every day. Taking vitamin and mineral supplements as recommended by your health care provider. What happens during an annual well check? The services and screenings done by your health care provider during your annual well check will depend on your age, overall health, lifestyle risk factors, and family history of disease. Counseling  Your health care provider may ask you questions about your: Alcohol  use. Tobacco use. Drug use. Emotional well-being. Home and relationship well-being. Sexual activity. Eating habits. History of falls. Memory and ability to understand (cognition). Work and work Statistician. Reproductive health. Screening  You may have the following tests or measurements: Height, weight, and BMI. Blood pressure. Lipid and cholesterol levels. These may be checked every 5 years, or more frequently if you are over 75 years old. Skin check. Lung cancer screening. You may have this screening every year starting at age 34 if you have a 30-pack-year history of smoking and currently smoke or have quit within the past 15 years. Fecal occult blood test (FOBT) of the stool. You may have this test every year starting at age 79. Flexible sigmoidoscopy or colonoscopy. You may have a sigmoidoscopy every 5 years or a colonoscopy every 10 years starting at age 8. Hepatitis C blood test. Hepatitis B blood test. Sexually transmitted disease (STD) testing. Diabetes screening. This is done by checking your blood sugar (glucose) after you have not eaten for a while (fasting). You may have this done every 1-3 years. Bone density scan. This is done to screen for osteoporosis. You may have this done starting at age 13. Mammogram. This may be done every 1-2 years. Talk to your health care provider about how often you should have regular mammograms. Talk with your health care provider about your test results, treatment options, and if necessary, the need for more tests. Vaccines  Your health care provider may recommend certain vaccines, such as: Influenza vaccine. This is recommended every year. Tetanus, diphtheria, and acellular pertussis (Tdap, Td) vaccine. You may need a Td booster every 10 years. Zoster vaccine. You may need this after age 31. Pneumococcal 13-valent conjugate (PCV13)  vaccine. One dose is recommended after age 51. Pneumococcal polysaccharide (PPSV23) vaccine. One dose is  recommended after age 90. Talk to your health care provider about which screenings and vaccines you need and how often you need them. This information is not intended to replace advice given to you by your health care provider. Make sure you discuss any questions you have with your health care provider. Document Released: 01/23/2015 Document Revised: 09/16/2015 Document Reviewed: 10/28/2014 Elsevier Interactive Patient Education  2017 [place] Prevention in the Home Falls can cause injuries. They can happen to people of all ages. There are many things you can do to make your home safe and to help prevent falls. What can I do on the outside of my home? Regularly fix the edges of walkways and driveways and fix any cracks. Remove anything that might make you trip as you walk through a door, such as a raised step or threshold. Trim any bushes or trees on the path to your home. Use bright outdoor lighting. Clear any walking paths of anything that might make someone trip, such as rocks or tools. Regularly check to see if handrails are loose or broken. Make sure that both sides of any steps have handrails. Any raised decks and porches should have guardrails on the edges. Have any leaves, snow, or ice cleared regularly. Use sand or salt on walking paths during winter. Clean up any spills in your garage right away. This includes oil or grease spills. What can I do in the bathroom? Use night lights. Install grab bars by the toilet and in the tub and shower. Do not use towel bars as grab bars. Use non-skid mats or decals in the tub or shower. If you need to sit down in the shower, use a plastic, non-slip stool. Keep the floor dry. Clean up any water that spills on the floor as soon as it happens. Remove soap buildup in the tub or shower regularly. Attach bath mats securely with double-sided non-slip rug tape. Do not have throw rugs and other things on the floor that can make you  trip. What can I do in the bedroom? Use night lights. Make sure that you have a light by your bed that is easy to reach. Do not use any sheets or blankets that are too big for your bed. They should not hang down onto the floor. Have a firm chair that has side arms. You can use this for support while you get dressed. Do not have throw rugs and other things on the floor that can make you trip. What can I do in the kitchen? Clean up any spills right away. Avoid walking on wet floors. Keep items that you use a lot in easy-to-reach places. If you need to reach something above you, use a strong step stool that has a grab bar. Keep electrical cords out of the way. Do not use floor polish or wax that makes floors slippery. If you must use wax, use non-skid floor wax. Do not have throw rugs and other things on the floor that can make you trip. What can I do with my stairs? Do not leave any items on the stairs. Make sure that there are handrails on both sides of the stairs and use them. Fix handrails that are broken or loose. Make sure that handrails are as long as the stairways. Check any carpeting to make sure that it is firmly attached to the stairs. Fix any carpet that is loose  or worn. Avoid having throw rugs at the top or bottom of the stairs. If you do have throw rugs, attach them to the floor with carpet tape. Make sure that you have a light switch at the top of the stairs and the bottom of the stairs. If you do not have them, ask someone to add them for you. What else can I do to help prevent falls? Wear shoes that: Do not have high heels. Have rubber bottoms. Are comfortable and fit you well. Are closed at the toe. Do not wear sandals. If you use a stepladder: Make sure that it is fully opened. Do not climb a closed stepladder. Make sure that both sides of the stepladder are locked into place. Ask someone to hold it for you, if possible. Clearly mark and make sure that you can  see: Any grab bars or handrails. First and last steps. Where the edge of each step is. Use tools that help you move around (mobility aids) if they are needed. These include: Canes. Walkers. Scooters. Crutches. Turn on the lights when you go into a dark area. Replace any light bulbs as soon as they burn out. Set up your furniture so you have a clear path. Avoid moving your furniture around. If any of your floors are uneven, fix them. If there are any pets around you, be aware of where they are. Review your medicines with your doctor. Some medicines can make you feel dizzy. This can increase your chance of falling. Ask your doctor what other things that you can do to help prevent falls. This information is not intended to replace advice given to you by your health care provider. Make sure you discuss any questions you have with your health care provider. Document Released: 10/23/2008 Document Revised: 06/04/2015 Document Reviewed: 01/31/2014 Elsevier Interactive Patient Education  2017 [employer]. =

## 2020-07-15 NOTE — Telephone Encounter (Signed)
Kaylee Warner, Kaylee Warner are scheduled for a virtual visit with your provider today.    Just as we do with appointments in the office, we must obtain your consent to participate.  Your consent will be active for this visit and any virtual visit you may have with one of our providers in the next 365 days.    If you have a MyChart account, I can also send a copy of this consent to you electronically.  All virtual visits are billed to your insurance company just like a traditional visit in the office.  As this is a virtual visit, video technology does not allow for your provider to perform a traditional examination.  This may limit your provider's ability to fully assess your condition.  If your provider identifies any concerns that need to be evaluated in person or the need to arrange testing such as labs, EKG, etc, we will make arrangements to do so.    Although advances in technology are sophisticated, we cannot ensure that it will always work on either your end or our end.  If the connection with a video visit is poor, we may have to switch to a telephone visit.  With either a video or telephone visit, we are not always able to ensure that we have a secure connection.   I need to obtain your verbal consent now.   Are you willing to proceed with your visit today?   Kaylee Warner has provided verbal consent on 07/15/2020 for a virtual visit (video or telephone).   [name], [place] 07/15/2020  12:52 PM

## 2020-07-15 NOTE — Telephone Encounter (Signed)
Called to inform patient that a prescription was sent in . Patient with understanding.

## 2020-07-15 NOTE — Progress Notes (Signed)
   This service is provided via telemedicine  No vital signs collected/recorded due to the encounter was a telemedicine visit.   Location of patient (ex: home, work):  Home  Patient consents to a telephone visit: Yes, see telephone visit dated 07/15/20  Location of the provider (ex: office, home):  [facility], Office   Name of any referring provider:  N/A  Names of all persons participating in the telemedicine service and their role in the encounter:  S.Chrae B/CMA, Sherrie Mustache, NP, and Patient   Time spent on call:  10 min with medical assistant

## 2020-07-16 ENCOUNTER — Telehealth: Payer: Self-pay

## 2020-07-16 ENCOUNTER — Telehealth: Payer: Medicare Other | Admitting: Student in an Organized Health Care Education/Training Program

## 2020-07-16 NOTE — Telephone Encounter (Signed)
Called patient, no answer, left message that she did not need appt for this afternoon and not to come in today for appt

## 2020-07-30 ENCOUNTER — Other Ambulatory Visit: Payer: Self-pay | Admitting: Family

## 2020-08-24 ENCOUNTER — Encounter: Payer: Self-pay | Admitting: Student in an Organized Health Care Education/Training Program

## 2020-08-24 ENCOUNTER — Telehealth: Payer: Self-pay

## 2020-08-24 NOTE — Telephone Encounter (Signed)
LM for patient to call office for pre virtual appointment questions.  

## 2020-08-25 ENCOUNTER — Other Ambulatory Visit: Payer: Self-pay

## 2020-08-25 ENCOUNTER — Ambulatory Visit
Payer: Medicare Other | Attending: Student in an Organized Health Care Education/Training Program | Admitting: Student in an Organized Health Care Education/Training Program

## 2020-08-25 DIAGNOSIS — M1711 Unilateral primary osteoarthritis, right knee: Secondary | ICD-10-CM

## 2020-08-25 DIAGNOSIS — G894 Chronic pain syndrome: Secondary | ICD-10-CM

## 2020-08-25 NOTE — Progress Notes (Signed)
I attempted to call the patient however no response. Voicemail left instructing patient to call front desk office at [phone] to reschedule appointment.  Notes, it states that patient would like to repeat right shoulder injection.  There is a.  Order placed.  Please activate that.  -Dr Holley Raring

## 2020-09-02 ENCOUNTER — Ambulatory Visit
Admission: RE | Admit: 2020-09-02 | Discharge: 2020-09-02 | Disposition: A | Discharge status: Home / Self Care | Payer: Medicare Other | Source: Ambulatory Visit | Attending: Student in an Organized Health Care Education/Training Program | Admitting: Student in an Organized Health Care Education/Training Program

## 2020-09-02 ENCOUNTER — Encounter: Payer: Self-pay | Admitting: Student in an Organized Health Care Education/Training Program

## 2020-09-02 ENCOUNTER — Other Ambulatory Visit: Payer: Self-pay

## 2020-09-02 ENCOUNTER — Ambulatory Visit (HOSPITAL_BASED_OUTPATIENT_CLINIC_OR_DEPARTMENT_OTHER): Payer: Medicare Other | Admitting: Student in an Organized Health Care Education/Training Program

## 2020-09-02 VITALS — BP 169/94 | HR 71 | Temp 97.2°F | Resp 18 | Ht 69.0 in | Wt 182.0 lb

## 2020-09-02 DIAGNOSIS — M19011 Primary osteoarthritis, right shoulder: Secondary | ICD-10-CM | POA: Insufficient documentation

## 2020-09-02 DIAGNOSIS — M1711 Unilateral primary osteoarthritis, right knee: Secondary | ICD-10-CM | POA: Diagnosis not present

## 2020-09-02 DIAGNOSIS — G894 Chronic pain syndrome: Secondary | ICD-10-CM | POA: Diagnosis not present

## 2020-09-02 MED ORDER — LIDOCAINE HCL 2 % IJ SOLN
20.0000 mL | Freq: Once | INTRAMUSCULAR | Status: AC
  Administered 2020-09-02: 100 mg

## 2020-09-02 MED ORDER — METHYLPREDNISOLONE ACETATE 40 MG/ML IJ SUSP
40.0000 mg | Freq: Once | INTRAMUSCULAR | Status: AC
  Administered 2020-09-02: 40 mg via INTRA_ARTICULAR
  Filled 2020-09-02: qty 1

## 2020-09-02 MED ORDER — ROPIVACAINE HCL 2 MG/ML IJ SOLN
4.0000 mL | Freq: Once | INTRAMUSCULAR | Status: AC
  Administered 2020-09-02: 4 mL via INTRA_ARTICULAR

## 2020-09-02 NOTE — Progress Notes (Signed)
Safety precautions to be maintained throughout the outpatient stay will include: orient to surroundings, keep bed in low position, maintain call bell within reach at all times, provide assistance with transfer out of bed and ambulation.  

## 2020-09-02 NOTE — Patient Instructions (Signed)

## 2020-09-02 NOTE — Progress Notes (Signed)
PROVIDER NOTE: Information contained herein reflects review and annotations entered in association with encounter. Interpretation of such information and data should be left to medically-trained personnel. Information provided to patient can be located elsewhere in the medical record under "Patient Instructions". Document created using STT-dictation technology, any transcriptional errors that may result from process are unintentional.    Patient: Kaylee Warner  Service Category: Procedure  Provider: Gillis Santa, MD  DOB: [birth year]  DOS: 09/02/2020  Location: [facility]  MRN: [id number]  Setting: Ambulatory - outpatient  Referring Provider: Sandrea Hughs, NP  Type: Established Patient  Specialty: Interventional Pain Management  PCP: Sandrea Hughs, NP   Primary Reason for Visit: Interventional Pain Management Treatment. CC: Shoulder Pain (Right )  Procedure:          Anesthesia, Analgesia, Anxiolysis:  Type: Therapeutic Glenohumeral Joint (shoulder) Injection #3  Primary Purpose: Diagnostic Region: Posterior Shoulder Area Level:  Shoulder Target Area: Glenohumeral Joint (shoulder) Approach: Posterior approach. Laterality: Right-Sided  Type: Local Anesthesia  Local Anesthetic: Lidocaine 1-2%  Position: Supine   Indications: 1. Primary osteoarthritis of right knee (hx of right knee arthroscopic surgery)   2. Arthropathy of right shoulder   3. Chronic pain syndrome    Pain Score: Pre-procedure: 9 /10 Post-procedure: 4 /10   Pre-op Assessment:  Kaylee Warner is a 75 y.o. (year old), female patient, seen today for interventional treatment. She  has a past surgical history that includes [place] 2009 and Colonoscopy. Kaylee Warner has a current medication list which includes the following prescription(s): amlodipine, aspirin ec, cetirizine, vitamin d3, donepezil, fluticasone, gabapentin, lisinopril, meloxicam, paroxetine, and tizanidine. Her primarily concern today is the  Shoulder Pain (Right )  Initial Vital Signs:  Pulse/HCG Rate: 71ECG Heart Rate: 69 Temp: (!) 97.2 F (36.2 C) Resp: 16 BP: (!) 157/78 SpO2: 99 %  BMI: Estimated body mass index is 26.88 kg/m as calculated from the following:   Height as of this encounter: '5\' 9"'$  (1.753 m).   Weight as of this encounter: 182 lb (82.6 kg).  Risk Assessment: Allergies: Reviewed. She is allergic to celecoxib, codeine, crestor [rosuvastatin calcium], fexofenadine, and penicillins.  Allergy Precautions: None required Coagulopathies: Reviewed. None identified.  Blood-thinner therapy: None at this time Active Infection(s): Reviewed. None identified. Kaylee Warner is afebrile  Site Confirmation: Kaylee Warner was asked to confirm the procedure and laterality before marking the site Procedure checklist: Completed Consent: Before the procedure and under the influence of no sedative(s), amnesic(s), or anxiolytics, the patient was informed of the treatment options, risks and possible complications. To fulfill our ethical and legal obligations, as recommended by the American Medical Association's Code of Ethics, I have informed the patient of my clinical impression; the nature and purpose of the treatment or procedure; the risks, benefits, and possible complications of the intervention; the alternatives, including doing nothing; the risk(s) and benefit(s) of the alternative treatment(s) or procedure(s); and the risk(s) and benefit(s) of doing nothing. The patient was provided information about the general risks and possible complications associated with the procedure. These may include, but are not limited to: failure to achieve desired goals, infection, bleeding, organ or nerve damage, allergic reactions, paralysis, and death. In addition, the patient was informed of those risks and complications associated to the procedure, such as failure to decrease pain; infection; bleeding; organ or nerve damage with subsequent damage to  sensory, motor, and/or autonomic systems, resulting in permanent pain, numbness, and/or weakness of one or several areas of the  body; allergic reactions; (i.e.: anaphylactic reaction); and/or death. Furthermore, the patient was informed of those risks and complications associated with the medications. These include, but are not limited to: allergic reactions (i.e.: anaphylactic or anaphylactoid reaction(s)); adrenal axis suppression; blood sugar elevation that in diabetics may result in ketoacidosis or comma; water retention that in patients with history of congestive heart failure may result in shortness of breath, pulmonary edema, and decompensation with resultant heart failure; weight gain; swelling or edema; medication-induced neural toxicity; particulate matter embolism and blood vessel occlusion with resultant organ, and/or nervous system infarction; and/or aseptic necrosis of one or more joints. Finally, the patient was informed that Medicine is not an exact science; therefore, there is also the possibility of unforeseen or unpredictable risks and/or possible complications that may result in a catastrophic outcome. The patient indicated having understood very clearly. We have given the patient no guarantees and we have made no promises. Enough time was given to the patient to ask questions, all of which were answered to the patient's satisfaction. Kaylee Warner has indicated that she wanted to continue with the procedure. Attestation: I, the ordering provider, attest that I have discussed with the patient the benefits, risks, side-effects, alternatives, likelihood of achieving goals, and potential problems during recovery for the procedure that I have provided informed consent. Date  Time: 09/02/2020 12:50 PM  Pre-Procedure Preparation:  Monitoring: As per clinic protocol. Respiration, ETCO2, SpO2, BP, heart rate and rhythm monitor placed and checked for adequate function Safety Precautions: Patient was  assessed for positional comfort and pressure points before starting the procedure. Time-out: I initiated and conducted the "Time-out" before starting the procedure, as per protocol. The patient was asked to participate by confirming the accuracy of the "Time Out" information. Verification of the correct person, site, and procedure were performed and confirmed by me, the nursing staff, and the patient. "Time-out" conducted as per Joint Commission's Universal Protocol (UP.01.01.01). Time: 1330  Description of Procedure:          Area Prepped: Entire shoulder Area DuraPrep (Iodine Povacrylex [0.7% available iodine] and Isopropyl Alcohol, 74% w/w) Safety Precautions: Aspiration looking for blood return was conducted prior to all injections. At no point did we inject any substances, as a needle was being advanced. No attempts were made at seeking any paresthesias. Safe injection practices and needle disposal techniques used. Medications properly checked for expiration dates. SDV (single dose vial) medications used. Description of the Procedure: Protocol guidelines were followed. The patient was placed in position over the procedure table. The target area was identified and the area prepped in the usual manner. Skin & deeper tissues infiltrated with local anesthetic. Appropriate amount of time allowed to pass for local anesthetics to take effect. The procedure needles were then advanced to the target area. Proper needle placement secured. Negative aspiration confirmed. Solution injected in intermittent fashion, asking for systemic symptoms every 0.5cc of injectate. The needles were then removed and the area cleansed, making sure to leave some of the prepping solution back to take advantage of its long term bactericidal properties.         Vitals:   09/02/20 1252 09/02/20 1330 09/02/20 1335  BP: (!) 157/78 (!) 177/96 (!) 169/94  Pulse: 71    Resp: '16 16 18  '$ Temp: (!) 97.2 F (36.2 C)    TempSrc: Temporal     SpO2: 99% 98% 98%  Weight: 182 lb (82.6 kg)    Height: '5\' 9"'$  (1.753 m)  Start Time: 1330 hrs. End Time: 1335 hrs. Materials:  Needle(s) Type: Spinal Needle Gauge: 25G Length: 3.5-in Medication(s): Please see orders for medications and dosing details. 5 cc solution made of 4 cc of 0.2% ropivacaine, 1 cc of methylprednisolone, 40 mg/cc.  Injected into right glenohumeral joint, posterior approach. Imaging Guidance (Non-Spinal):          Type of Imaging Technique: Fluoroscopy Guidance (Non-Spinal) Indication(s): Assistance in needle guidance and placement for procedures requiring needle placement in or near specific anatomical locations not easily accessible without such assistance. Exposure Time: Please see nurses notes. Contrast: Before injecting any contrast, we confirmed that the patient did not have an allergy to iodine, shellfish, or radiological contrast. Once satisfactory needle placement was completed at the desired level, radiological contrast was injected. Contrast injected under live fluoroscopy. No contrast complications. See chart for type and volume of contrast used. Fluoroscopic Guidance: I was personally present during the use of fluoroscopy. "Tunnel Vision Technique" used to obtain the best possible view of the target area. Parallax error corrected before commencing the procedure. "Direction-depth-direction" technique used to introduce the needle under continuous pulsed fluoroscopy. Once target was reached, antero-posterior, oblique, and lateral fluoroscopic projection used confirm needle placement in all planes. Images permanently stored in EMR. Interpretation: I personally interpreted the imaging intraoperatively. Adequate needle placement confirmed in multiple planes. Appropriate spread of contrast into desired area was observed. No evidence of afferent or efferent intravascular uptake. Permanent images saved into the patient's record.  Post-operative Assessment:   Post-procedure Vital Signs:  Pulse/HCG Rate: 7170 Temp: (!) 97.2 F (36.2 C) Resp: 18 BP: (!) 169/94 SpO2: 98 %  EBL: None  Complications: No immediate post-treatment complications observed by team, or reported by patient.  Note: The patient tolerated the entire procedure well. A repeat set of vitals were taken after the procedure and the patient was kept under observation following institutional policy, for this type of procedure. Post-procedural neurological assessment was performed, showing return to baseline, prior to discharge. The patient was provided with post-procedure discharge instructions, including a section on how to identify potential problems. Should any problems arise concerning this procedure, the patient was given instructions to immediately contact us, at any time, without hesitation. In any case, we plan to contact the patient by telephone for a follow-up status report regarding this interventional procedure.  Comments:  No additional relevant information.  Plan of Care  Orders:  Orders Placed This Encounter  Procedures   DG PA[facility] C-ARM 1-60 MIN NO REPORT    Intraoperative interpretation by procedural physician at [place].    Standing Status:   Standing    Number of Occurrences:   1    Order Specific Question:   Reason for exam:    Answer:   Assistance in needle guidance and placement for procedures requiring needle placement in or near specific anatomical locations not easily accessible without such assistance.    Medications ordered for procedure: Meds ordered this encounter  Medications   lidocaine (XYLOCAINE) 2 % (with pres) injection 400 mg   ropivacaine (PF) 2 mg/mL (0.2%) (NAROPIN) injection 4 mL   methylPREDNISolone acetate (DEPO-MEDROL) injection 40 mg    Medications administered: We administered lidocaine, ropivacaine (PF) 2 mg/mL (0.2%), and methylPREDNISolone acetate.  See the medical record for exact dosing, route, and time  of administration.  Follow-up plan:   Return in about 4 weeks (around 09/30/2020) for Post Procedure Evaluation, virtual.      Right posterior glenohumeral joint injection  10/28/2019, 05/25/20, 09/02/20   Recent Visits Date Type Provider Dept  08/25/20 Telemedicine Gillis Santa, MD [facility]  07/01/20 Telemedicine Gillis Santa, MD [facility]  Showing recent visits within past 90 days and meeting all other requirements Today's Visits Date Type Provider Dept  09/02/20 Procedure visit Gillis Santa, MD [facility]  Showing today's visits and meeting all other requirements Future Appointments Date Type Provider Dept  09/30/20 Appointment Gillis Santa, MD [facility]  Showing future appointments within next 90 days and meeting all other requirements Disposition: Discharge home  Discharge (Date  Time): 09/02/2020; 1347 hrs.   Primary Care Physician: Sandrea Hughs, NP Location: [facility] Note by: Gillis Santa, MD Date: 09/02/2020; Time: 1:46 PM  Disclaimer:  Medicine is not an exact science. The only guarantee in medicine is that nothing is guaranteed. It is important to note that the decision to proceed with this intervention was based on the information collected from the patient. The Data and conclusions were drawn from the patient's questionnaire, the interview, and the physical examination. Because the information was provided in large part by the patient, it cannot be guaranteed that it has not been purposely or unconsciously manipulated. Every effort has been made to obtain as much relevant data as possible for this evaluation. It is important to note that the conclusions that lead to this procedure are derived in large part from the available data. Always take into account that the treatment will also be dependent on availability of resources and existing treatment guidelines, considered by other Pain Management  Practitioners as being common knowledge and practice, at the time of the intervention. For Medico-Legal purposes, it is also important to point out that variation in procedural techniques and pharmacological choices are the acceptable norm. The indications, contraindications, technique, and results of the above procedure should only be interpreted and judged by a Board-Certified Interventional Pain Specialist with extensive familiarity and expertise in the same exact procedure and technique.

## 2020-09-03 ENCOUNTER — Telehealth: Payer: Self-pay

## 2020-09-03 NOTE — Telephone Encounter (Signed)
No Answer PP, instructed to call if needed.

## 2020-09-29 ENCOUNTER — Telehealth: Payer: Self-pay

## 2020-09-29 NOTE — Telephone Encounter (Signed)
LM for patient to call office for pre virtual appointment questions.  

## 2020-09-30 ENCOUNTER — Ambulatory Visit
Payer: Medicare Other | Attending: Student in an Organized Health Care Education/Training Program | Admitting: Student in an Organized Health Care Education/Training Program

## 2020-09-30 ENCOUNTER — Other Ambulatory Visit: Payer: Self-pay

## 2020-09-30 DIAGNOSIS — M1711 Unilateral primary osteoarthritis, right knee: Secondary | ICD-10-CM

## 2020-09-30 NOTE — Progress Notes (Signed)
I attempted to call the patient however no response. Voicemail left instructing patient to call front desk office at [phone] to reschedule appointment. -Dr Bayle Calvo  

## 2020-09-30 NOTE — Telephone Encounter (Signed)
LM for patient to call office for pre virtual appointment questions.  

## 2020-10-14 ENCOUNTER — Ambulatory Visit: Payer: Medicare Other

## 2020-10-14 ENCOUNTER — Ambulatory Visit (INDEPENDENT_AMBULATORY_CARE_PROVIDER_SITE_OTHER): Payer: Medicare Other | Admitting: Family

## 2020-10-14 ENCOUNTER — Encounter: Payer: Self-pay | Admitting: Family

## 2020-10-14 ENCOUNTER — Other Ambulatory Visit: Payer: Self-pay

## 2020-10-14 VITALS — BP 128/74 | HR 95 | Temp 97.3°F | Resp 16 | Ht 69.0 in | Wt 186.8 lb

## 2020-10-14 DIAGNOSIS — Z23 Encounter for immunization: Secondary | ICD-10-CM | POA: Diagnosis not present

## 2020-10-14 DIAGNOSIS — G8929 Other chronic pain: Secondary | ICD-10-CM

## 2020-10-14 DIAGNOSIS — M1711 Unilateral primary osteoarthritis, right knee: Secondary | ICD-10-CM | POA: Diagnosis not present

## 2020-10-14 DIAGNOSIS — M25511 Pain in right shoulder: Secondary | ICD-10-CM

## 2020-10-14 DIAGNOSIS — N951 Menopausal and female climacteric states: Secondary | ICD-10-CM

## 2020-10-14 DIAGNOSIS — R5383 Other fatigue: Secondary | ICD-10-CM

## 2020-10-14 NOTE — Progress Notes (Signed)
Provider: Marlowe Sax FNP-C  Donnika Kucher, Nelda Bucks, NP  Patient Care Team: Cortnie Ringel, Nelda Bucks, NP as PCP - General (Family Medicine)  Extended Emergency Contact Information Primary Emergency Contact: [place] Mobile Phone: [phone] Relation: Son Secondary Emergency Contact: Luhrs,James R Address: [address]          [place], [place] [address] [place] Phone: [phone] Relation: Spouse  Code Status:  DNR Goals of care: Advanced Directive information Advanced Directives 10/14/2020  Does Patient Have a Medical Advance Directive? No  Type of Advance Directive -  Copy of [place] in Chart? -  Would patient like information on creating a medical advance directive? No - Patient declined     Chief Complaint  Patient presents with   Acute Visit    Patient c/o of night sweats. Flu vaccine today     HPI:  Pt is a 75 y.o. female seen today for an acute visit for evaluation of hot flushes  worsening for the past 1 months.States wakes up sweating.Hot flushes also occurs during the day.Flushes mainly start from waist up but not on lower extremities.Had episode during visit. No changes in her eating habits,soap or lotion. Has had increased stress level caring for her husband who had hip replacement has not been able to walk for the past three weeks.doing therapy at home. Due to caring for the Husband she has not been able to go for her exercises at the [facility]. Denies any fever,chills or acute illness. Request referral for outpatient Therapy for her right shoulder and right knee which are getting more stiff.Has seen Orthopedic for cortisol injection. Has had one pound weight gain since previous visit  States right shin area has been itching scratching it.seems to be getting better.no open wounds.      Past Medical History:  Diagnosis Date   Allergy    Anemia    Arthritis    Hypertension    Past Surgical  History:  Procedure Laterality Date   COLONOSCOPY     SARK 2009     Dr. Jolee Ewing     Allergies  Allergen Reactions   Celecoxib     REACTION: blood pressure went up   Codeine Itching   Crestor [Rosuvastatin Calcium]     Muscle cramping and achy   Fexofenadine Other (See Comments)    unknown   Penicillins Rash    Outpatient Encounter Medications as of 10/14/2020  Medication Sig   amLODipine (NORVASC) 10 MG tablet Take 1/2 (one-half) tablet by mouth once daily   aspirin EC 81 MG tablet Take 1 tablet (81 mg total) by mouth daily.   cetirizine (ZYRTEC) 10 MG tablet Take 1 tablet (10 mg total) by mouth daily.   Cholecalciferol (VITAMIN D3) 125 MCG (5000 UT) CAPS Take 1 capsule (5,000 Units total) by mouth every morning.   donepezil (ARICEPT) 10 MG tablet TAKE 1 TABLET BY MOUTH AT BEDTIME   fluticasone (FLONASE) 50 MCG/ACT nasal spray Use 2 spray(s) in each nostril once daily   gabapentin (NEURONTIN) 300 MG capsule Take 1 capsule (300 mg total) by mouth daily as needed.   lisinopril (ZESTRIL) 40 MG tablet Take 1 tablet (40 mg total) by mouth daily.   meloxicam (MOBIC) 15 MG tablet Take 1 tablet (15 mg total) by mouth daily.   PARoxetine (PAXIL) 30 MG tablet Take 1 tablet by mouth once daily   tiZANidine (ZANAFLEX) 4 MG tablet Take 1 tablet (4 mg  total) by mouth 2 (two) times daily as needed.   No facility-administered encounter medications on file as of 10/14/2020.    Review of Systems  Constitutional:  Negative for appetite change, chills, fatigue, fever and unexpected weight change.  HENT:  Negative for congestion, dental problem, ear discharge, ear pain, facial swelling, hearing loss, nosebleeds, postnasal drip, rhinorrhea, sinus pressure, sinus pain, sneezing, sore throat, tinnitus and trouble swallowing.   Eyes:  Negative for pain, discharge, redness, itching and visual disturbance.  Respiratory:  Negative for cough, chest tightness, shortness of breath and wheezing.    Cardiovascular:  Negative for chest pain, palpitations and leg swelling.  Gastrointestinal:  Negative for abdominal distention, abdominal pain, constipation, diarrhea, nausea and vomiting.  Endocrine: Negative for polydipsia, polyphagia and polyuria.       Reports hot flushes at night and during the day   Genitourinary:  Negative for difficulty urinating, dysuria, flank pain, frequency and urgency.  Musculoskeletal:  Positive for arthralgias and back pain. Negative for gait problem, joint swelling, myalgias, neck pain and neck stiffness.       Right shoulder and knee pain   Skin:  Negative for color change, pallor, rash and wound.  Neurological:  Negative for dizziness, syncope, speech difficulty, weakness, light-headedness and headaches.  Hematological:  Does not bruise/bleed easily.  Psychiatric/Behavioral:  Negative for agitation, behavioral problems, confusion, hallucinations, self-injury, sleep disturbance and suicidal ideas. The patient is not nervous/anxious.    Immunization History  Administered Date(s) Administered   Fluad Quad(high Dose 65+) 10/29/2018, 10/14/2020   Influenza, High Dose Seasonal PF 11/24/2017   Influenza,inj,Quad PF,6+ Mos 09/24/2015, 12/15/2016   Moderna Sars-Covid-2 Vaccination 03/29/2019, 04/26/2019, 11/23/2019, 12/17/2019   Pneumococcal Conjugate-13 12/16/2015   Pneumococcal Polysaccharide-23 06/27/2018   Td 01/17/2017   Tdap 11/23/2010   Pertinent  Health Maintenance Due  Topic Date Due   COLONOSCOPY (Pts 45-8yr Insurance coverage will need to be confirmed)  10/17/2021   INFLUENZA VACCINE  Completed   DEXA SCAN  Completed   Fall Risk  10/14/2020 09/02/2020 07/15/2020 06/23/2020 05/25/2020  Falls in the past year? 0 0 0 0 1  Number falls in past yr: 0 0 0 0 0  Comment - - - - -  Injury with Fall? 0 - 0 0 1  Comment - - - - knee pain  Risk for fall due to : No Fall Risks No Fall Risks No Fall Risks - History of fall(s)  Follow up Falls evaluation  completed Falls evaluation completed Falls evaluation completed - -   Functional Status Survey:    Vitals:   10/14/20 1519  BP: 128/74  Pulse: 95  Resp: 16  Temp: (!) 97.3 F (36.3 C)  SpO2: 95%  Weight: 186 lb 12.8 oz (84.7 kg)  Height: '5\' 9"'  (1.753 m)   Body mass index is 27.59 kg/m. Physical Exam Vitals reviewed.  Constitutional:      General: She is not in acute distress.    Appearance: Normal appearance. She is overweight. She is not ill-appearing or diaphoretic.     Comments: Sweating during visit   HENT:     Head: Normocephalic.     Nose: Nose normal. No congestion or rhinorrhea.     Mouth/Throat:     Mouth: Mucous membranes are moist.     Pharynx: Oropharynx is clear. No oropharyngeal exudate or posterior oropharyngeal erythema.  Eyes:     General: No scleral icterus.       Right eye: No discharge.  Left eye: No discharge.     Conjunctiva/sclera: Conjunctivae normal.     Pupils: Pupils are equal, round, and reactive to light.  Neck:     Vascular: No carotid bruit.  Cardiovascular:     Rate and Rhythm: Normal rate and regular rhythm.     Pulses: Normal pulses.     Heart sounds: Normal heart sounds. No murmur heard.   No friction rub. No gallop.  Pulmonary:     Effort: Pulmonary effort is normal. No respiratory distress.     Breath sounds: Normal breath sounds. No wheezing, rhonchi or rales.  Chest:     Chest wall: No tenderness.  Abdominal:     General: Bowel sounds are normal. There is no distension.     Palpations: Abdomen is soft. There is no mass.     Tenderness: There is no abdominal tenderness. There is no right CVA tenderness, left CVA tenderness, guarding or rebound.  Musculoskeletal:        General: No swelling or tenderness. Normal range of motion.     Cervical back: Normal range of motion. No rigidity or tenderness.     Right lower leg: No edema.     Left lower leg: No edema.  Lymphadenopathy:     Cervical: No cervical adenopathy.   Skin:    General: Skin is warm and dry.     Coloration: Skin is not pale.     Findings: No erythema.     Comments: Right leg small scab areas healing   Neurological:     Mental Status: She is alert and oriented to person, place, and time.     Motor: No weakness.     Gait: Gait normal.  Psychiatric:        Mood and Affect: Mood normal.        Speech: Speech normal.        Behavior: Behavior normal.        Thought Content: Thought content normal.        Judgment: Judgment normal.    Labs reviewed: No results for input(s): NA, K, CL, CO2, GLUCOSE, BUN, CREATININE, CALCIUM, MG, PHOS in the last 8760 hours. No results for input(s): AST, ALT, ALKPHOS, BILITOT, PROT, ALBUMIN in the last 8760 hours. No results for input(s): WBC, NEUTROABS, HGB, HCT, MCV, PLT in the last 8760 hours. Lab Results  Component Value Date   TSH 1.88 05/08/2019   No results found for: HGBA1C Lab Results  Component Value Date   CHOL 244 (H) 06/15/2020   HDL 66 06/15/2020   LDLCALC 159 (H) 06/15/2020   TRIG 89 06/15/2020   CHOLHDL 3.7 06/15/2020    Significant Diagnostic Results in last 30 days:  No results found.  Assessment/Plan  1. Need for influenza vaccination Afebrile  Flut shot administered by [facility],C[facility] no acute reaction reported.  - Flu Vaccine QUAD High Dose(Fluad)  2. Chronic right shoulder pain Chronic. has had worsening stiffness  Will refer for physical therapy for ROM,exercise and muscle strengthening  - Ambulatory referral to Physical Medicine Rehab  3. Primary osteoarthritis of right knee Chronic.No swelling or erythema noted. has had worsening stiffness  Will refer for physical therapy for ROM,exercise and muscle strengthening  - Ambulatory referral to Physical Medicine Rehab  4. Menopausal hot flushes Worst for the past one month.Suspect due to increased stress level caring for the Husband and reduced Physical Activity.Not going to the [facility] as she used to  exercise.will rule out other metabolic etiologies -  continue on Gabapentin  - TSH; Future - CMP with eGFR([facility])  5. Fatigue, unspecified type Will rule out thyroid and other metabolic etiologies  - TSH; Future - CMP with eGFR([facility])   Family/ staff Communication: Reviewed plan of care with patient verbalized understanding   Labs/tests ordered:  - TSH; Future - CMP with eGFR([facility])  Next Appointment:As needed if symptoms worsen or fail to improve     Sandrea Hughs, NP

## 2020-10-15 LAB — COMPLETE METABOLIC PANEL WITH GFR
AG Ratio: 1.6 (calc) (ref 1.0–2.5)
ALT: 10 U/L (ref 6–29)
AST: 16 U/L (ref 10–35)
Albumin: 4.2 g/dL (ref 3.6–5.1)
Alkaline phosphatase (APISO): 71 U/L (ref 37–153)
BUN: 15 mg/dL (ref 7–25)
CO2: 22 mmol/L (ref 20–32)
Calcium: 9.7 mg/dL (ref 8.6–10.4)
Chloride: 107 mmol/L (ref 98–110)
Creat: 0.79 mg/dL (ref 0.60–1.00)
Globulin: 2.6 g/dL (calc) (ref 1.9–3.7)
Glucose, Bld: 92 mg/dL (ref 65–139)
Potassium: 4.3 mmol/L (ref 3.5–5.3)
Sodium: 140 mmol/L (ref 135–146)
Total Bilirubin: 0.7 mg/dL (ref 0.2–1.2)
Total Protein: 6.8 g/dL (ref 6.1–8.1)
eGFR: 78 mL/min/{1.73_m2} (ref >=60)

## 2020-10-16 ENCOUNTER — Telehealth: Payer: Self-pay

## 2020-10-16 NOTE — Telephone Encounter (Signed)
See lab results.within normal range

## 2020-10-16 NOTE — Telephone Encounter (Signed)
Patient called about her lab work and was wanting to know results. I advised patient that Marlowe Sax, NP will review her blood work and someone from the office will call her with results. Just an [place]

## 2020-10-19 NOTE — Telephone Encounter (Signed)
Patient was called via another message.

## 2020-11-04 ENCOUNTER — Ambulatory Visit (HOSPITAL_COMMUNITY): Payer: Medicare Other | Attending: Family | Admitting: Physical Therapy

## 2020-11-04 DIAGNOSIS — G8929 Other chronic pain: Secondary | ICD-10-CM | POA: Insufficient documentation

## 2020-11-04 DIAGNOSIS — M25561 Pain in right knee: Secondary | ICD-10-CM | POA: Insufficient documentation

## 2020-11-04 DIAGNOSIS — M545 Low back pain, unspecified: Secondary | ICD-10-CM | POA: Insufficient documentation

## 2020-11-04 DIAGNOSIS — M6281 Muscle weakness (generalized): Secondary | ICD-10-CM | POA: Insufficient documentation

## 2020-11-05 ENCOUNTER — Ambulatory Visit (HOSPITAL_COMMUNITY): Payer: Medicare Other | Admitting: Physical Therapy

## 2020-11-05 ENCOUNTER — Other Ambulatory Visit: Payer: Self-pay | Admitting: Family

## 2020-11-05 ENCOUNTER — Encounter (HOSPITAL_COMMUNITY): Payer: Self-pay | Admitting: Physical Therapy

## 2020-11-05 ENCOUNTER — Other Ambulatory Visit: Payer: Self-pay

## 2020-11-05 DIAGNOSIS — M545 Low back pain, unspecified: Secondary | ICD-10-CM

## 2020-11-05 DIAGNOSIS — M6281 Muscle weakness (generalized): Secondary | ICD-10-CM | POA: Diagnosis present

## 2020-11-05 DIAGNOSIS — G8929 Other chronic pain: Secondary | ICD-10-CM

## 2020-11-05 DIAGNOSIS — I10 Essential (primary) hypertension: Secondary | ICD-10-CM

## 2020-11-05 DIAGNOSIS — M25561 Pain in right knee: Secondary | ICD-10-CM | POA: Diagnosis present

## 2020-11-05 NOTE — Therapy (Signed)
[place], [place], [address] Phone: [phone]   Fax:  [phone]  Physical Therapy Evaluation  Patient Details  Name: Kaylee Warner MRN: [id number] Date of Birth: 1945/10/02 Referring Provider (PT): Sandrea Hughs, NP   Encounter Date: 11/05/2020   PT End of Session - 11/05/20 1436     Visit Number 1    Number of Visits 6    Date for PT Re-Evaluation 12/31/20    Authorization Type UHC medicare no VL or AUth    Progress Note Due on Visit 6    PT Start Time 1402    PT Stop Time 1438    PT Time Calculation (min) 36 min             Past Medical History:  Diagnosis Date   Allergy    Anemia    Arthritis    Hypertension     Past Surgical History:  Procedure Laterality Date   COLONOSCOPY     SARK 2009     Dr. Jolee Ewing     There were no vitals filed for this visit.    Subjective Assessment - 11/05/20 1409     Subjective States that she has pain in back, right shoulder and right knee. States she had a scope in her right knee many years ago and did good with that. States that recently she has been having fluid and swelling. Pain is primarily underneath her right knee cap. Described as tightness. States that her tightness increases after getting out of a position she has been in for a long time. States that she can't squat down or be able to stand for more than 10 minutes at a time to be able to participate in church services. Currently she has to sit because she has pain in the back and the knee.Pain in her back and knee has been going on for a long time. Patietn was going to the gym regularly but after COVID stopped.    Currently in Pain? Yes    Pain Score 6     Pain Location Knee    Pain Descriptors / Indicators Aching    Pain Type Chronic pain    Pain Radiating Towards none    Pain Frequency Intermittent    Aggravating Factors  standing, prolonged sitting    Pain Relieving Factors ice, mobic                 [facility] PT Assessment - 11/05/20 0001       Assessment   Medical Diagnosis right knee and back pain    Referring Provider (PT) Dinah C Ngetich, NP    Prior Therapy yes for knee      Balance Screen   Has the patient fallen in the past 6 months Yes    How many times? 2   fell out of bed and one time outside - reports she is off balance at time   Has the patient had a decrease in activity level because of a fear of falling?  No    Is the patient reluctant to leave their home because of a fear of falling?  No      Prior Function   Level of Independence Independent      Cognition   Overall Cognitive Status Within Functional Limits for tasks assessed      Observation/Other Assessments   Focus on Therapeutic Outcomes (FOTO)  33.8% function      ROM /  Strength   AROM / PROM / Strength AROM;Strength      AROM   AROM Assessment Site Lumbar;Hip;Knee    Right/Left Hip Right;Left    Right Hip Flexion 105    Right Hip External Rotation  45    Right Hip Internal Rotation  30    Left Hip Flexion 110    Left Hip External Rotation  45    Left Hip Internal Rotation  30    Right/Left Knee Right;Left    Right Knee Extension 0    Right Knee Flexion 120    Left Knee Extension 0    Left Knee Flexion 125      Strength   Strength Assessment Site Hip;Knee;Ankle    Right/Left Hip Right;Left    Right Hip Flexion 3+/5   pulling  on  back   Right Hip Extension 3-/5    Left Hip Flexion 3+/5   pulling on back   Left Hip Extension 3-/5   pain in back   Right/Left Knee Right;Left    Right Knee Flexion 3+/5    Right Knee Extension 3+/5    Left Knee Flexion 3+/5    Left Knee Extension 3+/5    Right/Left Ankle Right;Left    Right Ankle Dorsiflexion 4+/5    Left Ankle Dorsiflexion 5/5      Palpation   Palpation comment tenderness to palpation throughout lumbar musculature      Special Tests    Special Tests Lumbar    Lumbar Tests Slump Test;Prone Knee Bend Test;Straight  Leg Raise      Slump test   Findings Negative      Prone Knee Bend Test   Findings Positive    Side --   bilaterally     Straight Leg Raise   Findings Negative                        Objective measurements completed on examination: See above findings.       [facility] Adult PT Treatment/Exercise - 11/05/20 0001       Exercises   Exercises Knee/Hip      Knee/Hip Exercises: Stretches   Other Knee/Hip Stretches SKC x15 10" holds      Knee/Hip Exercises: Prone   Hamstring Curl 15 reps   Both                    PT Education - 11/05/20 1443     Education Details on current presentation, on HEP, on POC, on importance of mobility and strengthening, about cramping    Person(s) Educated Patient    Methods Explanation    Comprehension Verbalized understanding              PT Short Term Goals - 11/05/20 1442       PT SHORT TERM GOAL #1   Title Patient will be independent in self management strategies to improve quality of life and functional outcomes.    Time 3    Period Weeks    Status New    Target Date 11/26/20      PT SHORT TERM GOAL #2   Title Patient will report at least 50% improvement in overall symptoms and/or function to demonstrate improved functional mobility    Time 3    Period Weeks    Status New    Target Date 11/26/20               PT Long Term  Goals - 11/05/20 1442       PT LONG TERM GOAL #1   Title Patient will report at least 75% improvement in overall symptoms and/or function to demonstrate improved functional mobility    Time 8    Period Weeks    Status New    Target Date 12/31/20      PT LONG TERM GOAL #2   Title Patient will meet predicted FOTO score to demonstrate improved overall function.    Time 8    Period Weeks    Status New    Target Date 12/31/20      PT LONG TERM GOAL #3   Title Patient will be able to stand for at least 10 minutes at a time to improve ability to participate in church  activities.    Time 8    Period Weeks    Status New    Target Date 12/31/20                    Plan - 11/05/20 1444     Clinical Impression Statement Patient is a41  y.o. female who presents to physical therapy with complaint of back and right knee pain. She was doing a lot at the [facility] prior to [employer] but stopped when COVID started and would like to get back to going to the [facility]. Patient demonstrates decreased strength, ROM restriction, balance deficits and gait abnormalities which are likely contributing to symptoms of pain and are negatively impacting patient ability to perform ADLs and functional mobility tasks. Patient will benefit from skilled physical therapy services to address these deficits to reduce pain, improve level of function with ADLs, functional mobility tasks, and reduce risk for falls.    Personal Factors and Comorbidities Comorbidity 1;Age;Comorbidity 2    Comorbidities R shoulder pain    Examination-Activity Limitations Stand;Stairs;Squat;Sit;Locomotion Level;Lift;Transfers    Examination-Participation Restrictions Church;Cleaning;Meal Prep;Community Activity;Shop    Stability/Clinical Decision Making Stable/Uncomplicated    Clinical Decision Making Low    Rehab Potential Good    PT Frequency Other (comment)   total of 6 visits at max of 2 times a week over 8 week certification   PT Duration 8 weeks    PT Treatment/Interventions ADLs/Self Care Home Management;Electrical Stimulation;Moist Heat;Traction;Therapeutic exercise;Balance training;Manual techniques;Therapeutic activities;Parrafin;Neuromuscular re-education;Patient/family education    PT Next Visit Plan hip and lumabr mobility, strengthenign of hips/knee core- PRINT ALL EXERCISES, NEW EXERCISES EACH TIME, TRYING TO DEVELOPE HEP    PT Home Exercise Plan prone hamstring curls, SKC    Consulted and Agree with Plan of Care Patient             Patient will benefit from skilled therapeutic intervention in  order to improve the following deficits and impairments:  Pain, Decreased activity tolerance, Decreased mobility, Decreased range of motion, Decreased endurance, Decreased strength, Difficulty walking  Visit Diagnosis: Chronic midline low back pain without sciatica  Chronic pain of right knee  Muscle weakness (generalized)     Problem List Patient Active Problem List   Diagnosis Date Noted   Statin intolerance 06/28/2020   Right rotator cuff tear arthropathy 10/15/2019   Spinal stenosis, lumbar region, 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   Lumbar facet arthropathy 10/15/2019   Lumbar degenerative disc disease 10/15/2019   Chronic radicular lumbar pain 10/15/2019   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 37/62/8315   Skin tag 04/30/2019   Depression 11/29/2017   Chronic back pain greater than 3 months duration 01/21/2017  Fall 01/21/2017   High risk medication use 01/21/2017   Alopecia 01/21/2017   Lip laceration 01/21/2017   Seasonal allergic rhinitis due to pollen 08/30/2016   Essential hypertension 09/07/2015   Edema 05/09/2013   PTTD (posterior tibial tendon dysfunction) 05/09/2013   Primary osteoarthritis of right knee 05/09/2013   Hematoma of leg 10/04/2012   Chronic pain syndrome 10/04/2012   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   Knee pain 03/02/2011   Rotator cuff syndrome of right shoulder 06/22/2010   SPINAL STENOSIS 01/30/2008   Lumbar pain with radiation down right leg 01/30/2008   SCIATICA 01/30/2008   SPONDYLOLYSIS 01/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 [place], [place], [address] Phone: [phone]   Fax:  [phone]  Name: KASHAWNA MANZER MRN: [id number] Date of Birth: 11-Dec-1945

## 2020-11-09 ENCOUNTER — Other Ambulatory Visit: Payer: Self-pay | Admitting: Student in an Organized Health Care Education/Training Program

## 2020-11-09 ENCOUNTER — Encounter (HOSPITAL_COMMUNITY): Payer: Medicare Other | Admitting: Physical Therapy

## 2020-11-11 ENCOUNTER — Encounter (HOSPITAL_COMMUNITY): Payer: Self-pay

## 2020-11-11 ENCOUNTER — Other Ambulatory Visit: Payer: Self-pay

## 2020-11-11 ENCOUNTER — Ambulatory Visit (HOSPITAL_COMMUNITY): Payer: Medicare Other | Attending: Family

## 2020-11-11 DIAGNOSIS — G8929 Other chronic pain: Secondary | ICD-10-CM

## 2020-11-11 DIAGNOSIS — M545 Low back pain, unspecified: Secondary | ICD-10-CM | POA: Insufficient documentation

## 2020-11-11 DIAGNOSIS — M6281 Muscle weakness (generalized): Secondary | ICD-10-CM

## 2020-11-11 DIAGNOSIS — M25561 Pain in right knee: Secondary | ICD-10-CM | POA: Insufficient documentation

## 2020-11-11 NOTE — Therapy (Signed)
[name] [place], [place], [address] Phone: [phone]   Fax:  [phone]  Physical Therapy Treatment  Patient Details  Name: Kaylee Warner MRN: [id number] Date of Birth: 08/06/1945 Referring Provider (PT): Sandrea Hughs, NP   Encounter Date: 11/11/2020   PT End of Session - 11/11/20 1412     Visit Number 2    Number of Visits 6    Date for PT Re-Evaluation 12/31/20    Authorization Type UHC medicare no VL or AUth    Progress Note Due on Visit 6    PT Start Time 1409   late arrival   PT Stop Time 1445    PT Time Calculation (min) 36 min    Activity Tolerance Patient tolerated treatment well    Behavior During Therapy [facility] for tasks assessed/performed             Past Medical History:  Diagnosis Date   Allergy    Anemia    Arthritis    Hypertension     Past Surgical History:  Procedure Laterality Date   COLONOSCOPY     SARK 2009     Dr. Jolee Ewing     There were no vitals filed for this visit.   Subjective Assessment - 11/11/20 1411     Subjective Pt stated she is feeling good today, no reports of pain currently.  Pt c/o stiffness.  Has began the HEP wihtout questions.    Currently in Pain? No/denies                [facility] PT Assessment - 11/11/20 0001       Assessment   Medical Diagnosis right knee and back pain    Referring Provider (PT) Nelda Bucks Ngetich, NP    Prior Therapy yes for knee      Strength   Right Hip ABduction 3+/5    Left Hip ABduction 4/5                           [facility] Adult PT Treatment/Exercise - 11/11/20 0001       Exercises   Exercises Knee/Hip      Knee/Hip Exercises: Stretches   Active Hamstring Stretch Both;5 reps;10 seconds    Other Knee/Hip Stretches SKC x15 10" holds    Other Knee/Hip Stretches LTR 5x 10"      Knee/Hip Exercises: Seated   Other Seated Knee/Hip Exercises Postural awareness      Knee/Hip Exercises: Supine    Bridges Both;10 reps    Bridges Limitations 5" holds    Other Supine Knee/Hip Exercises bent knee raise  10x 5" with ab set      Knee/Hip Exercises: Sidelying   Clams 10x 5"                     PT Education - 11/11/20 1420     Education Details Reviewed goals, educated importance of HEP compliance for maximal benefits, pt able to recall current exercise program wihtout questions.    Person(s) Educated Patient    Methods Explanation    Comprehension Verbalized understanding;Returned demonstration              PT Short Term Goals - 11/05/20 1442       PT SHORT TERM GOAL #1   Title Patient will be independent in self management strategies to improve quality of life and functional outcomes.  Time 3    Period Weeks    Status New    Target Date 11/26/20      PT SHORT TERM GOAL #2   Title Patient will report at least 50% improvement in overall symptoms and/or function to demonstrate improved functional mobility    Time 3    Period Weeks    Status New    Target Date 11/26/20               PT Long Term Goals - 11/05/20 1442       PT LONG TERM GOAL #1   Title Patient will report at least 75% improvement in overall symptoms and/or function to demonstrate improved functional mobility    Time 8    Period Weeks    Status New    Target Date 12/31/20      PT LONG TERM GOAL #2   Title Patient will meet predicted FOTO score to demonstrate improved overall function.    Time 8    Period Weeks    Status New    Target Date 12/31/20      PT LONG TERM GOAL #3   Title Patient will be able to stand for at least 10 minutes at a time to improve ability to participate in church activities.    Time 8    Period Weeks    Status New    Target Date 12/31/20                   Plan - 11/11/20 1423     Clinical Impression Statement Reviewed goals, educated importance of HEP compliance fox maximal benefit, pt able to recall current exercise program.  Pt  educated importance of core strength to support lumbar region and posture to reduce strain of mm.  Session focus on core/hip strengthening and mobility.  Pt able to complete all exercises with good form and mechanics iwht min cueing for abdominal stabilty.  No reports of pain through session.  Additional exercises added to HEP with printout given and verbalize understanding.    Personal Factors and Comorbidities Comorbidity 1;Age;Comorbidity 2    Comorbidities R shoulder pain    Examination-Activity Limitations Stand;Stairs;Squat;Sit;Locomotion Level;Lift;Transfers    Examination-Participation Restrictions Church;Cleaning;Meal Prep;Community Activity;Shop    Stability/Clinical Decision Making Stable/Uncomplicated    Clinical Decision Making Low    Rehab Potential Good    PT Duration 8 weeks    PT Treatment/Interventions ADLs/Self Care Home Management;Electrical Stimulation;Moist Heat;Traction;Therapeutic exercise;Balance training;Manual techniques;Therapeutic activities;Parrafin;Neuromuscular re-education;Patient/family education    PT Next Visit Plan hip and lumabr mobility, strengthenign of hips/knee core- PRINT ALL EXERCISES, NEW EXERCISES EACH TIME, TRYING TO DEVELOPE HEP    PT Home Exercise Plan prone hamstring curls, SKC;  11/02: sitting posture, bridge, active hamstring stretch, LTR, clam    Consulted and Agree with Plan of Care Patient             Patient will benefit from skilled therapeutic intervention in order to improve the following deficits and impairments:  Pain, Decreased activity tolerance, Decreased mobility, Decreased range of motion, Decreased endurance, Decreased strength, Difficulty walking  Visit Diagnosis: Chronic midline low back pain without sciatica  Chronic pain of right knee  Muscle weakness (generalized)     Problem List Patient Active Problem List   Diagnosis Date Noted   Statin intolerance 06/28/2020   Right rotator cuff tear arthropathy 10/15/2019    Spinal stenosis, lumbar region, 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   Lumbar facet arthropathy 10/15/2019   Lumbar  degenerative disc disease 10/15/2019   Chronic radicular lumbar pain 10/15/2019   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 88/50/2774   Skin tag 04/30/2019   Depression 11/29/2017   Chronic back pain greater than 3 months duration 01/21/2017   Fall 01/21/2017   High risk medication use 01/21/2017   Alopecia 01/21/2017   Lip laceration 01/21/2017   Seasonal allergic rhinitis due to pollen 08/30/2016   Essential hypertension 09/07/2015   Edema 05/09/2013   PTTD (posterior tibial tendon dysfunction) 05/09/2013   Primary osteoarthritis of right knee 05/09/2013   Hematoma of leg 10/04/2012   Chronic pain syndrome 10/04/2012   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   Knee pain 03/02/2011   Rotator cuff syndrome of right shoulder 06/22/2010   SPINAL STENOSIS 01/30/2008   Lumbar pain with radiation down right leg 01/30/2008   SCIATICA 01/30/2008   SPONDYLOLYSIS 01/30/2008   SHOULDER PAIN 11/28/2007   IMPINGEMENT SYNDROME 11/28/2007   DEEP VENOUS THROMBOPHLEBITIS 09/24/2007   DERANGEMENT OF ANTERIOR HORN OF LATERAL MENISCUS 06/26/2007   LOWER LEG, ARTHRITIS, DEGEN./OSTEO 05/24/2007   DERANGEMENT MENISCUS 05/24/2007   JOINT EFFUSION, RIGHT KNEE 05/24/2007   KNEE PAIN 05/24/2007   Ihor Austin, LPTA/CLT; CBIS [phone]  Aldona Lento, PTA 11/11/2020, 2:56 PM  [facility] [place], [place], [address] Phone: [phone]   Fax:  [phone]  Name: BRIDGID PRINTZ MRN: [id number] Date of Birth: 1945-03-24

## 2020-11-11 NOTE — Patient Instructions (Addendum)
Posture - Sitting    Sit upright, head facing forward. Try using a roll to support lower back. Keep shoulders relaxed, and avoid rounded back. Keep hips level with knees. Avoid crossing legs for long periods.   Copyright  VHI. All rights reserved.    Bridge    Lie back, legs bent. Inhale, pressing hips up. Keeping ribs in, lengthen lower back. Exhale, rolling down along spine from top. Repeat 10 times. Do 2sessions per day.  http://pm.exer.us/55   Copyright  VHI. All rights reserved.   Hamstring Stretch: Active    Support behind right knee. Starting with knee bent, attempt to straighten knee until a comfortable stretch is felt in back of thigh. Hold 10 seconds. Repeat 5 times per set. Do 2 sets per day.  http://orth.exer.us/159   Copyright  VHI. All rights reserved.   Lower Trunk Rotation Stretch    Keeping back flat and feet together, rotate knees to left side. Hold 10 seconds. Repeat 5 times per set. Do 2 sets per day.  http://orth.exer.us/123   Copyright  VHI. All rights reserved.   Abduction: Clam (Eccentric) - Side-Lying    Lie on side with knees bent. Lift top knee, keeping feet together. Keep trunk steady. Slowly lower for 3-5 seconds. ___ reps per set, ___ sets per day, ___ days per week. Add ___ lbs when you achieve ___ repetitions.  http://ecce.exer.us/65   Copyright  VHI. All rights reserved.

## 2020-11-16 ENCOUNTER — Telehealth (INDEPENDENT_AMBULATORY_CARE_PROVIDER_SITE_OTHER): Payer: Medicare Other | Admitting: Adult Health

## 2020-11-16 ENCOUNTER — Other Ambulatory Visit: Payer: Self-pay

## 2020-11-16 ENCOUNTER — Encounter (HOSPITAL_COMMUNITY): Payer: Medicare Other | Admitting: Physical Therapy

## 2020-11-16 ENCOUNTER — Telehealth: Payer: Self-pay

## 2020-11-16 DIAGNOSIS — U071 COVID-19: Secondary | ICD-10-CM | POA: Diagnosis not present

## 2020-11-16 DIAGNOSIS — M5416 Radiculopathy, lumbar region: Secondary | ICD-10-CM

## 2020-11-16 MED ORDER — VITAMIN C 1000 MG PO TABS: 1000.0000 mg | ORAL_TABLET | Freq: Every day | ORAL | 0 refills | Status: AC

## 2020-11-16 MED ORDER — ZINC SULFATE 220 (50 ZN) MG PO TABS: 220.0000 mg | ORAL_TABLET | Freq: Every day | ORAL | 0 refills | Status: DC

## 2020-11-16 MED ORDER — MOLNUPIRAVIR EUA 200MG CAPSULE
4.0000 | ORAL_CAPSULE | Freq: Two times a day (BID) | ORAL | 0 refills | Status: AC
Start: 2020-11-16 — End: 2020-11-21

## 2020-11-16 NOTE — Telephone Encounter (Signed)
Wanting a refill on her tizantine

## 2020-11-16 NOTE — Patient Instructions (Signed)

## 2020-11-16 NOTE — Progress Notes (Signed)
This service is provided via telemedicine  No vital signs collected/recorded due to the encounter was a telemedicine visit.   Location of patient (ex: home, work):  Home  Patient consents to a telephone visit:  Yes  Location of the provider (ex: office, home):  Office  Name of any referring provider:  Ngetich, Dinah C, NP   Names of all persons participating in the telemedicine service and their role in the encounter:  Adellyn Heatwole, Evlyn Clines McClurkin,CMA, Einar Pheasant Vargas,NP  Time spent on call:  8 minutes      DATE:  11/16/2020  MRN:  [id number]  BIRTHDAY: [birth year]   Contact Information     Name Relation Home Work Mobile   Wilms,Roderick Son   [phone]   Allison, [employer] [place] Daughter [phone]             Chief Complaint  Patient presents with   Acute Visit    Patient tested positive for COVID on yesterday,11/15/20. She reports the following symptoms:headaches, cough, body aches, sweating, runny nose. She reports taking Mucinex and tylenol     HISTORY OF PRESENT ILLNESS: This is a 75 year old female who started having runny nose, chills with dry cough 2 days ago. She said that she does not check her temperature but "feels like" she has fevers since she hast sweaty forehead. She has sense of taste and smell but has poor appetite. She was tested for COVID-19 using a home kit by a nurse practitioner at her church. She is not aware of any covid-19 exposures. She lives with her husband at home and is also being checked in a clinic today.   PAST MEDICAL HISTORY:  Past Medical History:  Diagnosis Date   Allergy    Anemia    Arthritis    Hypertension      CURRENT MEDICATIONS: Reviewed  Patient's Medications  New Prescriptions   No medications on file  Previous Medications   AMLODIPINE (NORVASC) 10 MG TABLET    Take 1/2 (one-half) tablet by mouth once daily   ASPIRIN EC 81 MG TABLET    Take 1 tablet (81 mg total) by  mouth daily.   CETIRIZINE (ZYRTEC) 10 MG TABLET    Take 1 tablet (10 mg total) by mouth daily.   CHOLECALCIFEROL (VITAMIN D3) 125 MCG (5000 UT) CAPS    Take 1 capsule (5,000 Units total) by mouth every morning.   DONEPEZIL (ARICEPT) 10 MG TABLET    TAKE 1 TABLET BY MOUTH AT BEDTIME   FLUTICASONE (FLONASE) 50 MCG/ACT NASAL SPRAY    Use 2 spray(s) in each nostril once daily   GABAPENTIN (NEURONTIN) 300 MG CAPSULE    Take 1 capsule (300 mg total) by mouth daily as needed.   LISINOPRIL (ZESTRIL) 40 MG TABLET    Take 1 tablet (40 mg total) by mouth daily.   MELOXICAM (MOBIC) 15 MG TABLET    Take 1 tablet (15 mg total) by mouth daily.   PAROXETINE (PAXIL) 30 MG TABLET    Take 1 tablet by mouth once daily   TIZANIDINE (ZANAFLEX) 4 MG TABLET    Take 1 tablet (4 mg total) by mouth 2 (two) times daily as needed.  Modified Medications   No medications on file  Discontinued Medications   No medications on file     Allergies  Allergen Reactions   Celecoxib     REACTION: blood pressure went up   Codeine Itching   Crestor [Rosuvastatin Calcium]  Muscle cramping and achy   Fexofenadine Other (See Comments)    unknown   Penicillins Rash     REVIEW OF SYSTEMS:  GENERAL: poor appetite, + chills  MOUTH and THROAT: Denies oral discomfort, gingival pain or bleeding RESPIRATORY: + dry cough, no SOB CARDIAC: no chest pain, edema or palpitations GI: no abdominal pain, diarrhea, constipation, heart burn, nausea or vomiting GU: Denies dysuria, frequency, hematuria, incontinence, or discharge NEUROLOGICAL: + headache PSYCHIATRIC: Denies feeling of depression or anxiety. No report of hallucinations, insomnia, paranoia, or agitation    LABS/RADIOLOGY: Labs reviewed: Basic Metabolic Panel: Recent Labs    10/14/20 1544  NA 140  K 4.3  CL 107  CO2 22  GLUCOSE 92  BUN 15  CREATININE 0.79  CALCIUM 9.7   Liver Function Tests: Recent Labs    10/14/20 1544  AST 16  ALT 10  BILITOT 0.7   PROT 6.8   No results for input(s): LIPASE, AMYLASE in the last 8760 hours. No results for input(s): AMMONIA in the last 8760 hours. CBC: No results for input(s): WBC, NEUTROABS, HGB, HCT, MCV, PLT in the last 8760 hours. A1C: Invalid input(s): A1C Lipid Panel: Recent Labs    02/11/20 0951 06/15/20 0859  HDL 63 66       ASSESSMENT/PLAN:  1. COVID-19 virus infection - molnupiravir EUA (LAGEVRIO) 200 mg CAPS capsule; Take 4 capsules (800 mg total) by mouth 2 (two) times daily for 5 days.  Dispense: 40 capsule; Refill: 0 - Ascorbic Acid (VITAMIN C) 1000 MG tablet; Take 1 tablet (1,000 mg total) by mouth daily for 7 days.  Dispense: 7 tablet; Refill: 0 - Zinc Sulfate 220 (50 Zn) MG TABS; Take 1 tablet (220 mg total) by mouth daily.  Dispense: 7 tablet; Refill: 0 -  continue taking Vitamin D3 125 mcg orally daily     Time spent on non face to face visit:  10 minutes  The patient gave consent to this telephone visit. Explained to the patient the risk and privacy issue that was involved with this telephone call.   The patient was advised to call back and ask for an in-person evaluation if the symptoms worsen or if the condition fails to improve.   Durenda Age, NP [employer] [phone]

## 2020-11-17 MED ORDER — TIZANIDINE HCL 4 MG PO TABS
4.0000 mg | ORAL_TABLET | Freq: Two times a day (BID) | ORAL | 2 refills | Status: DC | PRN
Start: 2020-11-17 — End: 2021-04-06

## 2020-11-17 NOTE — Telephone Encounter (Signed)
Patient aware that zanaflex was sent in.

## 2020-11-18 ENCOUNTER — Encounter (HOSPITAL_COMMUNITY): Payer: Medicare Other | Admitting: Physical Therapy

## 2020-11-23 ENCOUNTER — Other Ambulatory Visit: Payer: Self-pay

## 2020-11-23 ENCOUNTER — Ambulatory Visit (HOSPITAL_COMMUNITY): Payer: Medicare Other | Admitting: Physical Therapy

## 2020-11-23 DIAGNOSIS — G8929 Other chronic pain: Secondary | ICD-10-CM

## 2020-11-23 DIAGNOSIS — M545 Low back pain, unspecified: Secondary | ICD-10-CM | POA: Diagnosis not present

## 2020-11-23 DIAGNOSIS — M25561 Pain in right knee: Secondary | ICD-10-CM

## 2020-11-23 DIAGNOSIS — M6281 Muscle weakness (generalized): Secondary | ICD-10-CM

## 2020-11-23 NOTE — Therapy (Signed)
[place], [place], [address] Phone: [phone]   Fax:  [phone]  Physical Therapy Treatment  Patient Details  Name: Kaylee Warner MRN: [id number] Date of Birth: 04-28-1945 Referring Provider (PT): Sandrea Hughs, NP   Encounter Date: 11/23/2020   PT End of Session - 11/23/20 1402     Visit Number 3    Number of Visits 6    Date for PT Re-Evaluation 12/31/20    Authorization Type UHC medicare no VL or AUth    Progress Note Due on Visit 6    PT Start Time 1330    PT Stop Time 1400    PT Time Calculation (min) 30 min    Activity Tolerance Patient tolerated treatment well    Behavior During Therapy [facility] for tasks assessed/performed             Past Medical History:  Diagnosis Date   Allergy    Anemia    Arthritis    Hypertension     Past Surgical History:  Procedure Laterality Date   COLONOSCOPY     SARK 2009     Dr. Jolee Ewing     There were no vitals filed for this visit.   Subjective Assessment - 11/23/20 1406     Subjective pt states she is just starting to feel herself following having the FLU.  STates her back is better but stil reports it high at 7/10.    Currently in Pain? Yes    Pain Location Back    Pain Orientation Mid;Lower                               [facility] Adult PT Treatment/Exercise - 11/23/20 0001       Exercises   Exercises Knee/Hip      Knee/Hip Exercises: Stretches   Active Hamstring Stretch Both;3 reps;20 seconds    Other Knee/Hip Stretches SKC x4 20" holds    Other Knee/Hip Stretches LTR 5x 10"      Knee/Hip Exercises: Standing   Other Standing Knee Exercises lumbar extension 10X      Knee/Hip Exercises: Supine   Bridges Both;10 reps    Bridges Limitations 5" holds    Straight Leg Raises Both;10 reps    Other Supine Knee/Hip Exercises bent knee raise  10x 5" with ab set      Knee/Hip Exercises: Sidelying   Clams 10x 5"                        PT Short Term Goals - 11/05/20 1442       PT SHORT TERM GOAL #1   Title Patient will be independent in self management strategies to improve quality of life and functional outcomes.    Time 3    Period Weeks    Status New    Target Date 11/26/20      PT SHORT TERM GOAL #2   Title Patient will report at least 50% improvement in overall symptoms and/or function to demonstrate improved functional mobility    Time 3    Period Weeks    Status New    Target Date 11/26/20               PT Long Term Goals - 11/05/20 1442       PT LONG TERM GOAL #1   Title Patient will report at  least 75% improvement in overall symptoms and/or function to demonstrate improved functional mobility    Time 8    Period Weeks    Status New    Target Date 12/31/20      PT LONG TERM GOAL #2   Title Patient will meet predicted FOTO score to demonstrate improved overall function.    Time 8    Period Weeks    Status New    Target Date 12/31/20      PT LONG TERM GOAL #3   Title Patient will be able to stand for at least 10 minutes at a time to improve ability to participate in church activities.    Time 8    Period Weeks    Status New    Target Date 12/31/20                   Plan - 11/23/20 1404     Clinical Impression Statement PT arrived late for appointment today and states she missed her appointments due to having the Flu.  Admits to not doing any of her exercises as she felt so bad.  States she's been on steroids and is all better now.  States her back calmed down some while she was resting, recovering.  Continued with established core stab, stretching and LE strengthening.  Unable to add new exercises this session due to time constraints.    Personal Factors and Comorbidities Comorbidity 1;Age;Comorbidity 2    Comorbidities R shoulder pain    Examination-Activity Limitations Stand;Stairs;Squat;Sit;Locomotion Level;Lift;Transfers     Examination-Participation Restrictions Church;Cleaning;Meal Prep;Community Activity;Shop    Stability/Clinical Decision Making Stable/Uncomplicated    Rehab Potential Good    PT Duration 8 weeks    PT Treatment/Interventions ADLs/Self Care Home Management;Electrical Stimulation;Moist Heat;Traction;Therapeutic exercise;Balance training;Manual techniques;Therapeutic activities;Parrafin;Neuromuscular re-education;Patient/family education    PT Next Visit Plan hip and lumabr mobility, strengthenign of hips/knee core- PRINT ALL EXERCISES, NEW EXERCISES EACH TIME, TRYING TO DEVELOP HEP    PT Home Exercise Plan prone hamstring curls, SKC;  11/02: sitting posture, bridge, active hamstring stretch, LTR, clam    Consulted and Agree with Plan of Care Patient             Patient will benefit from skilled therapeutic intervention in order to improve the following deficits and impairments:  Pain, Decreased activity tolerance, Decreased mobility, Decreased range of motion, Decreased endurance, Decreased strength, Difficulty walking  Visit Diagnosis: Chronic midline low back pain without sciatica  Chronic pain of right knee  Muscle weakness (generalized)     Problem List Patient Active Problem List   Diagnosis Date Noted   Statin intolerance 06/28/2020   Right rotator cuff tear arthropathy 10/15/2019   Spinal stenosis, lumbar region, 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   Lumbar facet arthropathy 10/15/2019   Lumbar degenerative disc disease 10/15/2019   Chronic radicular lumbar pain 10/15/2019   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 47/42/5956   Skin tag 04/30/2019   Depression 11/29/2017   Chronic back pain greater than 3 months duration 01/21/2017   Fall 01/21/2017   High risk medication use 01/21/2017   Alopecia 01/21/2017   Lip laceration 01/21/2017   Seasonal allergic rhinitis due to pollen 08/30/2016   Essential hypertension 09/07/2015   Edema  05/09/2013   PTTD (posterior tibial tendon dysfunction) 05/09/2013   Primary osteoarthritis of right knee 05/09/2013   Hematoma of leg 10/04/2012   Chronic pain syndrome 10/04/2012   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   Knee pain  03/02/2011   Rotator cuff syndrome of right shoulder 06/22/2010   SPINAL STENOSIS 01/30/2008   Lumbar pain with radiation down right leg 01/30/2008   SCIATICA 01/30/2008   SPONDYLOLYSIS 01/30/2008   SHOULDER PAIN 11/28/2007   IMPINGEMENT SYNDROME 11/28/2007   DEEP VENOUS THROMBOPHLEBITIS 09/24/2007   DERANGEMENT OF ANTERIOR HORN OF LATERAL MENISCUS 06/26/2007   LOWER LEG, ARTHRITIS, DEGEN./OSTEO 05/24/2007   DERANGEMENT MENISCUS 05/24/2007   JOINT EFFUSION, RIGHT KNEE 05/24/2007   KNEE PAIN 05/24/2007   Teena Irani, PTA/CLT, WTA [phone]  Teena Irani, PTA 11/23/2020, 2:07 PM  [facility] [address] [place], [place], [address] Phone: [phone]   Fax:  [phone]  Name: Kaylee Warner MRN: [id number] Date of Birth: October 14, 1945

## 2020-11-26 ENCOUNTER — Ambulatory Visit (HOSPITAL_COMMUNITY): Payer: Medicare Other | Admitting: Physical Therapy

## 2020-11-26 ENCOUNTER — Other Ambulatory Visit: Payer: Self-pay

## 2020-11-26 ENCOUNTER — Encounter (HOSPITAL_COMMUNITY): Payer: Self-pay | Admitting: Physical Therapy

## 2020-11-26 DIAGNOSIS — M545 Low back pain, unspecified: Secondary | ICD-10-CM

## 2020-11-26 DIAGNOSIS — G8929 Other chronic pain: Secondary | ICD-10-CM

## 2020-11-26 DIAGNOSIS — M25561 Pain in right knee: Secondary | ICD-10-CM

## 2020-11-26 DIAGNOSIS — M6281 Muscle weakness (generalized): Secondary | ICD-10-CM

## 2020-11-26 NOTE — Therapy (Signed)
[place], [place], [address] Phone: [phone]   Fax:  [phone]  Physical Therapy Treatment  Patient Details  Name: Kaylee Warner MRN: [id number] Date of Birth: 1945/09/24 Referring Provider (PT): Sandrea Hughs, NP   Encounter Date: 11/26/2020   PT End of Session - 11/26/20 1408     Visit Number 4    Number of Visits 6    Date for PT Re-Evaluation 12/31/20    Authorization Type [employer] medicare no VL or AUth    Progress Note Due on Visit 6    PT Start Time 1410   late to check in   PT Stop Time 1440    PT Time Calculation (min) 30 min    Activity Tolerance Patient tolerated treatment well    Behavior During Therapy [facility] for tasks assessed/performed             Past Medical History:  Diagnosis Date   Allergy    Anemia    Arthritis    Hypertension     Past Surgical History:  Procedure Laterality Date   COLONOSCOPY     SARK 2009     Dr. Jolee Ewing     There were no vitals filed for this visit.   Subjective Assessment - 11/26/20 1446     Subjective States she is doing alright and is feeling decent and is 7/10 in her shoulder and head.    Currently in Pain? Yes    Pain Score 7     Pain Location Shoulder    Pain Orientation Right                [facility] PT Assessment - 11/26/20 0001       Assessment   Medical Diagnosis right knee and back pain    Referring Provider (PT) Sandrea Hughs, NP                           [facility] Adult PT Treatment/Exercise - 11/26/20 0001       Knee/Hip Exercises: Stretches   Other Knee/Hip Stretches DKC 2 minutes      Knee/Hip Exercises: Supine   Other Supine Knee/Hip Exercises 90/90 hamstring isometrics into ball 2 minutes total    Other Supine Knee/Hip Exercises 90/90 on ball - deep breathing with and without shoulder ROM; LTR on ball 2 minutes total; bent knee fall outs x2 1 minute alternating.; bent knee fall ins x2 1 minutes       Modalities   Modalities Moist Heat      Moist Heat Therapy   Number Minutes Moist Heat 15 Minutes    Moist Heat Location Shoulder   right                      PT Short Term Goals - 11/05/20 1442       PT SHORT TERM GOAL #1   Title Patient will be independent in self management strategies to improve quality of life and functional outcomes.    Time 3    Period Weeks    Status New    Target Date 11/26/20      PT SHORT TERM GOAL #2   Title Patient will report at least 50% improvement in overall symptoms and/or function to demonstrate improved functional mobility    Time 3    Period Weeks    Status New    Target Date  11/26/20               PT Long Term Goals - 11/05/20 1442       PT LONG TERM GOAL #1   Title Patient will report at least 75% improvement in overall symptoms and/or function to demonstrate improved functional mobility    Time 8    Period Weeks    Status New    Target Date 12/31/20      PT LONG TERM GOAL #2   Title Patient will meet predicted FOTO score to demonstrate improved overall function.    Time 8    Period Weeks    Status New    Target Date 12/31/20      PT LONG TERM GOAL #3   Title Patient will be able to stand for at least 10 minutes at a time to improve ability to participate in church activities.    Time 8    Period Weeks    Status New    Target Date 12/31/20                   Plan - 11/26/20 1436     Clinical Impression Statement Overall patient tolerated session well. Added heat to shoulder secondary complaints of right shoulder pain. Patient reported reduced pain in back and shoulder end of session and really enjoyed the heat. Added all new exercises to HEP with print offs for reference. Patient with interest in being able to squat to the floor again, will assess this next session.    Personal Factors and Comorbidities Comorbidity 1;Age;Comorbidity 2    Comorbidities R shoulder pain    Examination-Activity  Limitations Stand;Stairs;Squat;Sit;Locomotion Level;Lift;Transfers    Examination-Participation Restrictions Church;Cleaning;Meal Prep;Community Activity;Shop    Stability/Clinical Decision Making Stable/Uncomplicated    Rehab Potential Good    PT Duration 8 weeks    PT Treatment/Interventions ADLs/Self Care Home Management;Electrical Stimulation;Moist Heat;Traction;Therapeutic exercise;Balance training;Manual techniques;Therapeutic activities;Parrafin;Neuromuscular re-education;Patient/family education    PT Next Visit Plan assess deep squatting ability- patient would like to squat again, patient likes heat on back/shoulder, hip and lumabr mobility, strengthenign of hips/knee core- PRINT ALL EXERCISES, NEW EXERCISES EACH TIME, TRYING TO DEVELOP HEP    PT Home Exercise Plan prone hamstring curls, SKC;  11/02: sitting posture, bridge, active hamstring stretch, LTR, clam; 11/17 HIP ER/IR, DKC,LTR    Consulted and Agree with Plan of Care Patient             Patient will benefit from skilled therapeutic intervention in order to improve the following deficits and impairments:  Pain, Decreased activity tolerance, Decreased mobility, Decreased range of motion, Decreased endurance, Decreased strength, Difficulty walking  Visit Diagnosis: Chronic midline low back pain without sciatica  Chronic pain of right knee  Muscle weakness (generalized)     Problem List Patient Active Problem List   Diagnosis Date Noted   Statin intolerance 06/28/2020   Right rotator cuff tear arthropathy 10/15/2019   Spinal stenosis, lumbar region, 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   Lumbar facet arthropathy 10/15/2019   Lumbar degenerative disc disease 10/15/2019   Chronic radicular lumbar pain 10/15/2019   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 16/10/9602   Skin tag 04/30/2019   Depression 11/29/2017   Chronic back pain greater than 3 months duration 01/21/2017   Fall 01/21/2017    High risk medication use 01/21/2017   Alopecia 01/21/2017   Lip laceration 01/21/2017   Seasonal allergic rhinitis due to pollen 08/30/2016   Essential hypertension 09/07/2015  Edema 05/09/2013   PTTD (posterior tibial tendon dysfunction) 05/09/2013   Primary osteoarthritis of right knee 05/09/2013   Hematoma of leg 10/04/2012   Chronic pain syndrome 10/04/2012   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   Knee pain 03/02/2011   Rotator cuff syndrome of right shoulder 06/22/2010   SPINAL STENOSIS 01/30/2008   Lumbar pain with radiation down right leg 01/30/2008   SCIATICA 01/30/2008   SPONDYLOLYSIS 01/30/2008   SHOULDER PAIN 11/28/2007   IMPINGEMENT SYNDROME 11/28/2007   DEEP VENOUS THROMBOPHLEBITIS 09/24/2007   DERANGEMENT OF ANTERIOR HORN OF LATERAL MENISCUS 06/26/2007   LOWER LEG, ARTHRITIS, DEGEN./OSTEO 05/24/2007   DERANGEMENT MENISCUS 05/24/2007   JOINT EFFUSION, RIGHT KNEE 05/24/2007   KNEE PAIN 05/24/2007   2:46 PM, 11/26/20 Jerene Pitch, DPT Physical Therapy with [facility]  [phone] office   [place] [address] [place], [place], [address] Phone: [phone]   Fax:  [phone]  Name: ISABELLAH SOBOCINSKI MRN: [id number] Date of Birth: 21-Feb-1945

## 2020-11-30 ENCOUNTER — Ambulatory Visit (HOSPITAL_COMMUNITY): Payer: Medicare Other | Admitting: Physical Therapy

## 2020-11-30 ENCOUNTER — Encounter (HOSPITAL_COMMUNITY): Payer: Self-pay | Admitting: Physical Therapy

## 2020-11-30 ENCOUNTER — Other Ambulatory Visit: Payer: Self-pay

## 2020-11-30 DIAGNOSIS — M545 Low back pain, unspecified: Secondary | ICD-10-CM | POA: Diagnosis not present

## 2020-11-30 DIAGNOSIS — G8929 Other chronic pain: Secondary | ICD-10-CM

## 2020-11-30 DIAGNOSIS — M25561 Pain in right knee: Secondary | ICD-10-CM

## 2020-11-30 DIAGNOSIS — M6281 Muscle weakness (generalized): Secondary | ICD-10-CM

## 2020-11-30 NOTE — Therapy (Signed)
Kaylee Warner, [place], [address] Phone: [phone]   Fax:  [phone]  Physical Therapy Treatment  Patient Details  Name: Kaylee Warner MRN: [id number] Date of Birth: [birth year] Referring Provider (PT): Sandrea Hughs, NP   Encounter Date: 11/30/2020   PT End of Session - 11/30/20 1341     Visit Number 5    Number of Visits 6    Date for PT Re-Evaluation 12/31/20    Authorization Type UHC medicare no VL or AUth    Progress Note Due on Visit 6    PT Start Time 1318    PT Stop Time 1400    PT Time Calculation (min) 42 min    Activity Tolerance Patient tolerated treatment well             Past Medical History:  Diagnosis Date   Allergy    Anemia    Arthritis    Hypertension     Past Surgical History:  Procedure Laterality Date   COLONOSCOPY     SARK 2009     Dr. Jolee Warner     There were no vitals filed for this visit.   Subjective Assessment - 11/30/20 1317     Subjective Pt states that her back is bothering her pretty bad, she had to take an ibuprofen    Currently in Pain? Yes    Pain Score 9     Pain Location Back    Pain Orientation Right    Pain Descriptors / Indicators Aching    Pain Type Chronic pain    Pain Onset More than a month ago    Pain Frequency Intermittent    Aggravating Factors  WB    Pain Relieving Factors ice and meds                               [facility] Adult PT Treatment/Exercise - 11/30/20 0001       Knee/Hip Exercises: Stretches   Active Hamstring Stretch Both;3 reps;20 seconds      Knee/Hip Exercises: Standing   Functional Squat 10 reps    Other Standing Knee Exercises bend knee raises x 10    Other Standing Knee Exercises hip excursion x 3      Knee/Hip Exercises: Seated   Other Seated Knee/Hip Exercises scapular retraction x10      Knee/Hip Exercises: Supine   Bridges 15 reps    Bridges Limitations 5"    Other Supine Knee/Hip  Exercises decompression ex x 5    Other Supine Knee/Hip Exercises ab set x 10                       PT Short Term Goals - 11/30/20 1347       PT SHORT TERM GOAL #1   Title Patient will be independent in self management strategies to improve quality of life and functional outcomes.    Time 3    Period Weeks    Status On-going    Target Date 11/26/20      PT SHORT TERM GOAL #2   Title Patient will report at least 50% improvement in overall symptoms and/or function to demonstrate improved functional mobility    Time 3    Period Weeks    Status On-going    Target Date 11/26/20  PT Long Term Goals - 11/30/20 1347       PT LONG TERM GOAL #1   Title Patient will report at least 75% improvement in overall symptoms and/or function to demonstrate improved functional mobility    Time 8    Period Weeks    Status On-going      PT LONG TERM GOAL #2   Title Patient will meet predicted FOTO score to demonstrate improved overall function.    Time 8    Period Weeks    Status On-going      PT LONG TERM GOAL #3   Title Patient will be able to stand for at least 10 minutes at a time to improve ability to participate in church activities.    Time 8    Period Weeks    Status On-going                   Plan - 11/30/20 1342     Personal Factors and Comorbidities Comorbidity 1;Age;Comorbidity 2    Comorbidities R shoulder pain    Examination-Activity Limitations Stand;Stairs;Squat;Sit;Locomotion Level;Lift;Transfers    Examination-Participation Restrictions Church;Cleaning;Meal Prep;Community Activity;Shop    Stability/Clinical Decision Making Stable/Uncomplicated    Rehab Potential Good    PT Duration 8 weeks    PT Treatment/Interventions ADLs/Self Care Home Management;Electrical Stimulation;Moist Heat;Traction;Therapeutic exercise;Balance training;Manual techniques;Therapeutic activities;Parrafin;Neuromuscular re-education;Patient/family  education    PT Next Visit Plan Reassess.  Improve hip and lumabr mobility, strengthenign of hips/knee core- PRINT ALL EXERCISES, NEW EXERCISES EACH TIME, TRYING TO DEVELOP HEP    PT Home Exercise Plan prone hamstring curls, SKC;  11/02: sitting posture, bridge, active hamstring stretch, LTR, clam; 11/17 HIP ER/IR, DKC,LTR/ 11/21: hip excursions, decompression exercises, functional squat. abdominal set    Consulted and Agree with Plan of Care Patient             Patient will benefit from skilled therapeutic intervention in order to improve the following deficits and impairments:  Pain, Decreased activity tolerance, Decreased mobility, Decreased range of motion, Decreased endurance, Decreased strength, Difficulty walking  Visit Diagnosis: Chronic midline low back pain without sciatica  Chronic pain of right knee  Muscle weakness (generalized)     Problem List Patient Active Problem List   Diagnosis Date Noted   Statin intolerance 06/28/2020   Right rotator cuff tear arthropathy 10/15/2019   Spinal stenosis, lumbar region, 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   Lumbar facet arthropathy 10/15/2019   Lumbar degenerative disc disease 10/15/2019   Chronic radicular lumbar pain 10/15/2019   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 70/35/0093   Skin tag 04/30/2019   Depression 11/29/2017   Chronic back pain greater than 3 months duration 01/21/2017   Fall 01/21/2017   High risk medication use 01/21/2017   Alopecia 01/21/2017   Lip laceration 01/21/2017   Seasonal allergic rhinitis due to pollen 08/30/2016   Essential hypertension 09/07/2015   Edema 05/09/2013   PTTD (posterior tibial tendon dysfunction) 05/09/2013   Primary osteoarthritis of right knee 05/09/2013   Hematoma of leg 10/04/2012   Chronic pain syndrome 10/04/2012   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   Knee pain 03/02/2011   Rotator cuff syndrome of right shoulder 06/22/2010   SPINAL  STENOSIS 01/30/2008   Lumbar pain with radiation down right leg 01/30/2008   SCIATICA 01/30/2008   SPONDYLOLYSIS 01/30/2008   SHOULDER PAIN 11/28/2007   IMPINGEMENT SYNDROME 11/28/2007   DEEP VENOUS THROMBOPHLEBITIS 09/24/2007   DERANGEMENT OF ANTERIOR HORN OF LATERAL MENISCUS 06/26/2007  LOWER LEG, ARTHRITIS, DEGEN./OSTEO 05/24/2007   DERANGEMENT MENISCUS 05/24/2007   JOINT EFFUSION, RIGHT KNEE 05/24/2007   KNEE PAIN 05/24/2007   Rayetta Humphrey, PT CLT [phone]  11/30/2020, 2:00 PM  [place] [address] [place], [place], [address] Phone: [phone]   Fax:  [phone]  Name: Kaylee Warner MRN: [id number] Date of Birth: 05-02-1945

## 2020-12-08 ENCOUNTER — Other Ambulatory Visit: Payer: Self-pay

## 2020-12-08 ENCOUNTER — Ambulatory Visit (HOSPITAL_COMMUNITY): Payer: Medicare Other | Admitting: Physical Therapy

## 2020-12-08 ENCOUNTER — Encounter (HOSPITAL_COMMUNITY): Payer: Self-pay | Admitting: Physical Therapy

## 2020-12-08 DIAGNOSIS — M25561 Pain in right knee: Secondary | ICD-10-CM

## 2020-12-08 DIAGNOSIS — M545 Low back pain, unspecified: Secondary | ICD-10-CM

## 2020-12-08 DIAGNOSIS — M6281 Muscle weakness (generalized): Secondary | ICD-10-CM

## 2020-12-08 DIAGNOSIS — G8929 Other chronic pain: Secondary | ICD-10-CM

## 2020-12-08 NOTE — Therapy (Signed)
[place], [place], [address] Phone: [phone]   Fax:  [phone]  Physical Therapy Treatment, Progress Note and RECERT  Patient Details  Name: Kaylee Warner MRN: [id number] Date of Birth: 08-Dec-1945 Referring Provider (PT): Sandrea Hughs, NP  Progress Note Reporting Period 11/05/20 to 12/08/20  See note below for Objective Data and Assessment of Progress/Goals.      Encounter Date: 12/08/2020   PT End of Session - 12/08/20 1405     Visit Number 6    Number of Visits 12    Date for PT Re-Evaluation 01/05/21    Authorization Type [employer] medicare no VL or AUth    Progress Note Due on Visit 12    PT Start Time 1406   late checkin   PT Stop Time 1442    PT Time Calculation (min) 36 min    Activity Tolerance Patient tolerated treatment well             Past Medical History:  Diagnosis Date   Allergy    Anemia    Arthritis    Hypertension     Past Surgical History:  Procedure Laterality Date   COLONOSCOPY     SARK 2009     Dr. Jolee Ewing     There were no vitals filed for this visit.   Subjective Assessment - 12/08/20 1410     Subjective States that she sprained her ankle over the holiday week and she has been icing and wearing her brace. States that she feels it is getting better but is still swollen. States her pain is her normal.    Currently in Pain? Yes    Pain Score --   normal pain   Pain Onset More than a month ago                [facility] PT Assessment - 12/08/20 0001       Assessment   Medical Diagnosis right knee and back pain    Referring Provider (PT) Sandrea Hughs, NP      Observation/Other Assessments   Focus on Therapeutic Outcomes (FOTO)  --   49% function, predicted 52% function, originally 34% function     Strength   Right Hip Flexion 4-/5    Right Hip Extension 3+/5    Right Hip ABduction 3+/5    Left Hip Flexion 4-/5    Left Hip Extension 3+/5    Left  Hip ABduction 4/5    Right Knee Flexion 4-/5    Right Knee Extension 4-/5    Left Knee Flexion 4-/5    Left Knee Extension 4-/5                                    PT Education - 12/08/20 1442     Education Details on walking program, on current presentation, on plan moving forward.    Person(s) Educated Patient    Methods Explanation    Comprehension Verbalized understanding              PT Short Term Goals - 12/08/20 1412       PT SHORT TERM GOAL #1   Title Patient will be independent in self management strategies to improve quality of life and functional outcomes.    Time 3    Period Weeks    Status Achieved    Target Date  11/26/20      PT SHORT TERM GOAL #2   Title Patient will report at least 50% improvement in overall symptoms and/or function to demonstrate improved functional mobility    Baseline 50% better    Time 3    Period Weeks    Status Achieved    Target Date 11/26/20               PT Long Term Goals - 12/08/20 1417       PT LONG TERM GOAL #1   Title Patient will report at least 75% improvement in overall symptoms and/or function to demonstrate improved functional mobility    Time 8    Period Weeks    Status On-going      PT LONG TERM GOAL #2   Title Patient will meet predicted FOTO score to demonstrate improved overall function.    Time 8    Period Weeks    Status On-going      PT LONG TERM GOAL #3   Title Patient will be able to stand for at least 10 minutes at a time to improve ability to participate in church activities.    Baseline standign 10-15 minutes at church    Time 8    Period Weeks    Status Achieved                   Plan - 12/08/20 1444     Clinical Impression Statement Patient has met all short term goals and has met 1/3  long term goals at this time. Discussed current presentation and plan moving forward. Extending POC additional 6 visits to continue to work on functional strength  and mobility. Added walking program to HEP.    Personal Factors and Comorbidities Comorbidity 1;Age;Comorbidity 2    Comorbidities R shoulder pain    Examination-Activity Limitations Stand;Stairs;Squat;Sit;Locomotion Level;Lift;Transfers    Examination-Participation Restrictions Church;Cleaning;Meal Prep;Community Activity;Shop    Stability/Clinical Decision Making Stable/Uncomplicated    Rehab Potential Good    PT Frequency Other (comment)   additional 6 visits at 1-2x.week over the next 4 weeks.   PT Duration 4 weeks    PT Treatment/Interventions ADLs/Self Care Home Management;Electrical Stimulation;Moist Heat;Traction;Therapeutic exercise;Balance training;Manual techniques;Therapeutic activities;Parrafin;Neuromuscular re-education;Patient/family education    PT Next Visit Plan f/u wiht walking program - , hip and lumbar mobility, strengthening of hips/knee core- PRINT ALL EXERCISES, NEW EXERCISES EACH TIME, TRYING TO DEVELOP HEP    PT Home Exercise Plan prone hamstring curls, SKC;  11/02: sitting posture, bridge, active hamstring stretch, LTR, clam; 11/17 HIP ER/IR, DKC,LTR/ 11/21: hip excursions, decompression exercises, functional squat. abdominal set; 11/29 walking program    Consulted and Agree with Plan of Care Patient             Patient will benefit from skilled therapeutic intervention in order to improve the following deficits and impairments:  Pain, Decreased activity tolerance, Decreased mobility, Decreased range of motion, Decreased endurance, Decreased strength, Difficulty walking  Visit Diagnosis: Chronic midline low back pain without sciatica  Chronic pain of right knee  Muscle weakness (generalized)     Problem List Patient Active Problem List   Diagnosis Date Noted   Statin intolerance 06/28/2020   Right rotator cuff tear arthropathy 10/15/2019   Spinal stenosis, lumbar region, 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   Lumbar facet arthropathy 10/15/2019    Lumbar degenerative disc disease 10/15/2019   Chronic radicular lumbar pain 10/15/2019   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 67/67/2094  Skin tag 04/30/2019   Depression 11/29/2017   Chronic back pain greater than 3 months duration 01/21/2017   Fall 01/21/2017   High risk medication use 01/21/2017   Alopecia 01/21/2017   Lip laceration 01/21/2017   Seasonal allergic rhinitis due to pollen 08/30/2016   Essential hypertension 09/07/2015   Edema 05/09/2013   PTTD (posterior tibial tendon dysfunction) 05/09/2013   Primary osteoarthritis of right knee 05/09/2013   Hematoma of leg 10/04/2012   Chronic pain syndrome 10/04/2012   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   Knee pain 03/02/2011   Rotator cuff syndrome of right shoulder 06/22/2010   SPINAL STENOSIS 01/30/2008   Lumbar pain with radiation down right leg 01/30/2008   SCIATICA 01/30/2008   SPONDYLOLYSIS 01/30/2008   SHOULDER PAIN 11/28/2007   IMPINGEMENT SYNDROME 11/28/2007   DEEP VENOUS THROMBOPHLEBITIS 09/24/2007   DERANGEMENT OF ANTERIOR HORN OF LATERAL MENISCUS 06/26/2007   LOWER LEG, ARTHRITIS, DEGEN./OSTEO 05/24/2007   DERANGEMENT MENISCUS 05/24/2007   JOINT EFFUSION, RIGHT KNEE 05/24/2007   KNEE PAIN 05/24/2007   2:46 PM, 12/08/20 Jerene Pitch, DPT Physical Therapy with [facility]  [phone] office   [place], [place], [address] Phone: [phone]   Fax:  [phone]  Name: TAMIKIA CHOWNING MRN: [id number] Date of Birth: 1945-09-12

## 2020-12-10 ENCOUNTER — Ambulatory Visit (HOSPITAL_COMMUNITY): Payer: Medicare Other | Admitting: Physical Therapy

## 2020-12-15 ENCOUNTER — Encounter (HOSPITAL_COMMUNITY): Payer: Self-pay

## 2020-12-15 ENCOUNTER — Other Ambulatory Visit: Payer: Self-pay

## 2020-12-15 ENCOUNTER — Ambulatory Visit (HOSPITAL_COMMUNITY): Payer: Medicare Other | Attending: Family

## 2020-12-15 DIAGNOSIS — M545 Low back pain, unspecified: Secondary | ICD-10-CM | POA: Diagnosis present

## 2020-12-15 DIAGNOSIS — G8929 Other chronic pain: Secondary | ICD-10-CM | POA: Diagnosis present

## 2020-12-15 DIAGNOSIS — M6281 Muscle weakness (generalized): Secondary | ICD-10-CM | POA: Diagnosis present

## 2020-12-15 DIAGNOSIS — M25561 Pain in right knee: Secondary | ICD-10-CM | POA: Insufficient documentation

## 2020-12-15 NOTE — Therapy (Signed)
[place], [place], [address] Phone: [phone]   Fax:  [phone]  Physical Therapy Treatment  Patient Details  Name: Kaylee Warner MRN: [id number] Date of Birth: Jul 01, 1945 Referring Provider (PT): Sandrea Hughs, NP   Encounter Date: 12/15/2020   PT End of Session - 12/15/20 1324     Visit Number 7    Number of Visits 12    Date for PT Re-Evaluation 01/05/21    Authorization Type UHC medicare no VL or AUth    Progress Note Due on Visit 12    PT Start Time 1316    PT Stop Time 1356    PT Time Calculation (min) 40 min    Activity Tolerance Patient tolerated treatment well    Behavior During Therapy [facility] for tasks assessed/performed             Past Medical History:  Diagnosis Date   Allergy    Anemia    Arthritis    Hypertension     Past Surgical History:  Procedure Laterality Date   COLONOSCOPY     SARK 2009     Dr. Jolee Ewing     There were no vitals filed for this visit.   Subjective Assessment - 12/15/20 1320     Subjective Pt stated her ankle is feeling better.  Stated her kids are concerned with her Rt LE ER and valgus of knee during gait.  No reports of pain of back pain today.  Reports she had to watch son's dog over weekend, plans to begin walking progrm soon.    Currently in Pain? No/denies                               [facility] Adult PT Treatment/Exercise - 12/15/20 0001       Exercises   Exercises Knee/Hip      Knee/Hip Exercises: Standing   Heel Raises 10 reps    Hip Flexion Both;10 reps    Hip Flexion Limitations alternating bent knee raise 3" holds intermittent HHA    Functional Squat 10 reps    Functional Squat Limitations mirror, verbal and tactile cueing for proper mechanics    Other Standing Knee Exercises RTB shoulder extension then row 2x 10 (given GTB to add to HEP).    Other Standing Knee Exercises tandem stance 2x 30"                        PT Short Term Goals - 12/08/20 1412       PT SHORT TERM GOAL #1   Title Patient will be independent in self management strategies to improve quality of life and functional outcomes.    Time 3    Period Weeks    Status Achieved    Target Date 11/26/20      PT SHORT TERM GOAL #2   Title Patient will report at least 50% improvement in overall symptoms and/or function to demonstrate improved functional mobility    Baseline 50% better    Time 3    Period Weeks    Status Achieved    Target Date 11/26/20               PT Long Term Goals - 12/08/20 1417       PT LONG TERM GOAL #1   Title Patient will report at least 75% improvement in overall  symptoms and/or function to demonstrate improved functional mobility    Time 8    Period Weeks    Status On-going      PT LONG TERM GOAL #2   Title Patient will meet predicted FOTO score to demonstrate improved overall function.    Time 8    Period Weeks    Status On-going      PT LONG TERM GOAL #3   Title Patient will be able to stand for at least 10 minutes at a time to improve ability to participate in church activities.    Baseline standign 10-15 minutes at church    Time 8    Period Weeks    Status Achieved                   Plan - 12/15/20 1358     Clinical Impression Statement Added standing postural and gluteal strengthening exercises.  Multimodal cueing for proper mechanics with squats and increased knee valgus wiht exercise, educated on mechanics to reduce valgus and improve form.  Added static balance with intermittent HHA required.  No reports of pain, was limited by fatigue.  Added theraband postural strengthening, heel raise and tandem stance by counter to HEP with printout given.    Personal Factors and Comorbidities Comorbidity 1;Age;Comorbidity 2    Comorbidities R shoulder pain    Examination-Activity Limitations Stand;Stairs;Squat;Sit;Locomotion Level;Lift;Transfers     Examination-Participation Restrictions Church;Cleaning;Meal Prep;Community Activity;Shop    Stability/Clinical Decision Making Stable/Uncomplicated    Clinical Decision Making Low    Rehab Potential Good    PT Frequency Other (comment)   additional 6 visits at 1-2x.week over the next 4 weeks.   PT Duration 4 weeks    PT Treatment/Interventions ADLs/Self Care Home Management;Electrical Stimulation;Moist Heat;Traction;Therapeutic exercise;Balance training;Manual techniques;Therapeutic activities;Parrafin;Neuromuscular re-education;Patient/family education    PT Next Visit Plan f/u wiht walking program - , hip and lumbar mobility, strengthening of hips/knee core- PRINT ALL EXERCISES, NEW EXERCISES EACH TIME, TRYING TO DEVELOP HEP    PT Home Exercise Plan prone hamstring curls, SKC;  11/02: sitting posture, bridge, active hamstring stretch, LTR, clam; 11/17 HIP ER/IR, DKC,LTR/ 11/21: hip excursions, decompression exercises, functional squat. abdominal set; 11/29 walking program; 12/15/20: Heel raise, tandem stance by counter, GTB shoulder extension and rows.    Consulted and Agree with Plan of Care Patient             Patient will benefit from skilled therapeutic intervention in order to improve the following deficits and impairments:  Pain, Decreased activity tolerance, Decreased mobility, Decreased range of motion, Decreased endurance, Decreased strength, Difficulty walking  Visit Diagnosis: Chronic midline low back pain without sciatica  Chronic pain of right knee  Muscle weakness (generalized)     Problem List Patient Active Problem List   Diagnosis Date Noted   Statin intolerance 06/28/2020   Right rotator cuff tear arthropathy 10/15/2019   Spinal stenosis, lumbar region, 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   Lumbar facet arthropathy 10/15/2019   Lumbar degenerative disc disease 10/15/2019   Chronic radicular lumbar pain 10/15/2019   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 38/10/1749   Skin tag 04/30/2019   Depression 11/29/2017   Chronic back pain greater than 3 months duration 01/21/2017   Fall 01/21/2017   High risk medication use 01/21/2017   Alopecia 01/21/2017   Lip laceration 01/21/2017   Seasonal allergic rhinitis due to pollen 08/30/2016   Essential hypertension 09/07/2015   Edema 05/09/2013   PTTD (posterior tibial  tendon dysfunction) 05/09/2013   Primary osteoarthritis of right knee 05/09/2013   Hematoma of leg 10/04/2012   Chronic pain syndrome 10/04/2012   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   Knee pain 03/02/2011   Rotator cuff syndrome of right shoulder 06/22/2010   SPINAL STENOSIS 01/30/2008   Lumbar pain with radiation down right leg 01/30/2008   SCIATICA 01/30/2008   SPONDYLOLYSIS 01/30/2008   SHOULDER PAIN 11/28/2007   IMPINGEMENT SYNDROME 11/28/2007   DEEP VENOUS THROMBOPHLEBITIS 09/24/2007   DERANGEMENT OF ANTERIOR HORN OF LATERAL MENISCUS 06/26/2007   LOWER LEG, ARTHRITIS, DEGEN./OSTEO 05/24/2007   DERANGEMENT MENISCUS 05/24/2007   JOINT EFFUSION, RIGHT KNEE 05/24/2007   KNEE PAIN 05/24/2007   Ihor Austin, LPTA/CLT; CBIS [phone]  Aldona Lento, PTA 12/15/2020, 2:12 PM  [place], [place], [address] Phone: [phone]   Fax:  [phone]  Name: Kaylee Warner MRN: [id number] Date of Birth: Apr 28, 1945

## 2020-12-15 NOTE — Patient Instructions (Signed)
Heel Raise (Calf Strength / Balance)    Stand with support by counter/sink. Breathe in. Rise up on tiptoes, breathing out through pursed lips. Hold position to count of 3". Return slowly, breathing in. Repeat 10 times per session. Do 2 sessions per day. Variation: Do without weights.  Copyright  VHI. All rights reserved.    Tandem Stance    Stand in front of counter or sink. Right foot in front of left, heel touching toe both feet "straight ahead".  Stand on Foot Triangle of Support with both feet.  Balance in this position 30 seconds. Do with left foot in front of right.  Copyright  VHI. All rights reserved.

## 2020-12-17 ENCOUNTER — Other Ambulatory Visit: Payer: Self-pay

## 2020-12-17 ENCOUNTER — Encounter (HOSPITAL_COMMUNITY): Payer: Self-pay

## 2020-12-17 ENCOUNTER — Ambulatory Visit (HOSPITAL_COMMUNITY): Payer: Medicare Other

## 2020-12-17 DIAGNOSIS — G8929 Other chronic pain: Secondary | ICD-10-CM

## 2020-12-17 DIAGNOSIS — M545 Low back pain, unspecified: Secondary | ICD-10-CM | POA: Diagnosis not present

## 2020-12-17 DIAGNOSIS — M6281 Muscle weakness (generalized): Secondary | ICD-10-CM

## 2020-12-17 NOTE — Therapy (Signed)
[place], [place], [address] Phone: [phone]   Fax:  [phone]  Physical Therapy Treatment  Patient Details  Name: Kaylee Warner MRN: [id number] Date of Birth: [birth year] Referring Provider (PT): Sandrea Hughs, NP   Encounter Date: 12/17/2020   PT End of Session - 12/17/20 1503     Visit Number 8    Number of Visits 12    Date for PT Re-Evaluation 01/05/21    Authorization Type UHC medicare no VL or AUth    Progress Note Due on Visit 12    PT Start Time 1453   late signin   PT Stop Time 1532    PT Time Calculation (min) 39 min    Activity Tolerance Patient tolerated treatment well    Behavior During Therapy [facility] for tasks assessed/performed             Past Medical History:  Diagnosis Date   Allergy    Anemia    Arthritis    Hypertension     Past Surgical History:  Procedure Laterality Date   COLONOSCOPY     SARK 2009     Dr. Jolee Ewing     There were no vitals filed for this visit.   Subjective Assessment - 12/17/20 1502     Subjective Pt stated she had progressive soreness following last session.  Her Lt ankle increased soreeness as well with some bruising on medial aspect.    Currently in Pain? Yes    Pain Score 7     Pain Location Back    Pain Orientation Lower    Pain Descriptors / Indicators Sore;Aching    Pain Type Chronic pain    Pain Radiating Towards none    Pain Onset More than a month ago    Pain Frequency Intermittent    Aggravating Factors  WB    Pain Relieving Factors ice and meds                               [facility] Adult PT Treatment/Exercise - 12/17/20 0001       Knee/Hip Exercises: Stretches   Other Knee/Hip Stretches cat/camel 5x 10"; child's pose 3x 30"    Other Knee/Hip Stretches LTR 10x 10"      Knee/Hip Exercises: Aerobic   Nustep 4' UE/LE L2      Knee/Hip Exercises: Prone   Other Prone Exercises Quadruped UE 5x then LE 5x       Moist Heat Therapy   Number Minutes Moist Heat --   During supine exercises   Moist Heat Location Lumbar Spine                     PT Education - 12/17/20 1528     Education Details Pt requested therapist to look at ankle, therapist examined some bruising and edema present medial malleoli, encouraged to return to brace, elevate, ice, may benefit from compression garment    Person(s) Educated Patient    Methods Explanation;Demonstration    Comprehension Verbalized understanding              PT Short Term Goals - 12/08/20 1412       PT SHORT TERM GOAL #1   Title Patient will be independent in self management strategies to improve quality of life and functional outcomes.    Time 3    Period Weeks  Status Achieved    Target Date 11/26/20      PT SHORT TERM GOAL #2   Title Patient will report at least 50% improvement in overall symptoms and/or function to demonstrate improved functional mobility    Baseline 50% better    Time 3    Period Weeks    Status Achieved    Target Date 11/26/20               PT Long Term Goals - 12/08/20 1417       PT LONG TERM GOAL #1   Title Patient will report at least 75% improvement in overall symptoms and/or function to demonstrate improved functional mobility    Time 8    Period Weeks    Status On-going      PT LONG TERM GOAL #2   Title Patient will meet predicted FOTO score to demonstrate improved overall function.    Time 8    Period Weeks    Status On-going      PT LONG TERM GOAL #3   Title Patient will be able to stand for at least 10 minutes at a time to improve ability to participate in church activities.    Baseline standign 10-15 minutes at church    Time 8    Period Weeks    Status Achieved                   Plan - 12/17/20 1504     Clinical Impression Statement Added Nustep for dynamic warmup to assist with soreness, educated benefits with dynamic exercises at gym to assist with  soreness.  Sessoin focus with lumbar mobility and core/proximal strengthening to assist with soreness.  Pt liked child's pose, added to HEP.  Stretches complete in pain free range and EOS in supine stretches wiht MHP wiht reports of relief following.    Personal Factors and Comorbidities Comorbidity 1;Age;Comorbidity 2    Comorbidities R shoulder pain    Examination-Activity Limitations Stand;Stairs;Squat;Sit;Locomotion Level;Lift;Transfers    Examination-Participation Restrictions Church;Cleaning;Meal Prep;Community Activity;Shop    Stability/Clinical Decision Making Stable/Uncomplicated    Clinical Decision Making Low    Rehab Potential Good    PT Frequency --   additional 6 visits at 1-2x.week over the next 4 weeks.   PT Duration 4 weeks    PT Treatment/Interventions ADLs/Self Care Home Management;Electrical Stimulation;Moist Heat;Traction;Therapeutic exercise;Balance training;Manual techniques;Therapeutic activities;Parrafin;Neuromuscular re-education;Patient/family education    PT Home Exercise Plan prone hamstring curls, SKC;  11/02: sitting posture, bridge, active hamstring stretch, LTR, clam; 11/17 HIP ER/IR, DKC,LTR/ 11/21: hip excursions, decompression exercises, functional squat. abdominal set; 11/29 walking program; 12/15/20: Heel raise, tandem stance by counter, GTB shoulder extension and rows. 12/8: quadruped UE/LE, cat/camel, child's pose    Consulted and Agree with Plan of Care Patient             Patient will benefit from skilled therapeutic intervention in order to improve the following deficits and impairments:  Pain, Decreased activity tolerance, Decreased mobility, Decreased range of motion, Decreased endurance, Decreased strength, Difficulty walking  Visit Diagnosis: Chronic midline low back pain without sciatica  Chronic pain of right knee  Muscle weakness (generalized)     Problem List Patient Active Problem List   Diagnosis Date Noted   Statin intolerance  06/28/2020   Right rotator cuff tear arthropathy 10/15/2019   Spinal stenosis, lumbar region, 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   Lumbar facet arthropathy 10/15/2019   Lumbar degenerative disc disease 10/15/2019   Chronic radicular  lumbar pain 10/15/2019   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 60/60/0459   Skin tag 04/30/2019   Depression 11/29/2017   Chronic back pain greater than 3 months duration 01/21/2017   Fall 01/21/2017   High risk medication use 01/21/2017   Alopecia 01/21/2017   Lip laceration 01/21/2017   Seasonal allergic rhinitis due to pollen 08/30/2016   Essential hypertension 09/07/2015   Edema 05/09/2013   PTTD (posterior tibial tendon dysfunction) 05/09/2013   Primary osteoarthritis of right knee 05/09/2013   Hematoma of leg 10/04/2012   Chronic pain syndrome 10/04/2012   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   Knee pain 03/02/2011   Rotator cuff syndrome of right shoulder 06/22/2010   SPINAL STENOSIS 01/30/2008   Lumbar pain with radiation down right leg 01/30/2008   SCIATICA 01/30/2008   SPONDYLOLYSIS 01/30/2008   SHOULDER PAIN 11/28/2007   IMPINGEMENT SYNDROME 11/28/2007   DEEP VENOUS THROMBOPHLEBITIS 09/24/2007   DERANGEMENT OF ANTERIOR HORN OF LATERAL MENISCUS 06/26/2007   LOWER LEG, ARTHRITIS, DEGEN./OSTEO 05/24/2007   DERANGEMENT MENISCUS 05/24/2007   JOINT EFFUSION, RIGHT KNEE 05/24/2007   KNEE PAIN 05/24/2007   Ihor Austin, LPTA/CLT; CBIS [phone]  Aldona Lento, PTA 12/17/2020, 3:41 PM  [place], [place], [address] Phone: [phone]   Fax:  [phone]  Name: ANAJA MONTS MRN: [id number] Date of Birth: [birth year]

## 2020-12-22 ENCOUNTER — Encounter (HOSPITAL_COMMUNITY): Payer: Self-pay | Admitting: Physical Therapy

## 2020-12-22 ENCOUNTER — Other Ambulatory Visit: Payer: Self-pay

## 2020-12-22 ENCOUNTER — Ambulatory Visit (HOSPITAL_COMMUNITY): Payer: Medicare Other | Admitting: Physical Therapy

## 2020-12-22 DIAGNOSIS — M545 Low back pain, unspecified: Secondary | ICD-10-CM | POA: Diagnosis not present

## 2020-12-22 DIAGNOSIS — G8929 Other chronic pain: Secondary | ICD-10-CM

## 2020-12-22 DIAGNOSIS — M25561 Pain in right knee: Secondary | ICD-10-CM

## 2020-12-22 DIAGNOSIS — M6281 Muscle weakness (generalized): Secondary | ICD-10-CM

## 2020-12-22 NOTE — Therapy (Signed)
[place], [place], [address] Phone: [phone]   Fax:  [phone]  Physical Therapy Treatment  Patient Details  Name: Kaylee Warner MRN: [id number] Date of Birth: June 22, 1945 Referring Provider (PT): Sandrea Hughs, NP   Encounter Date: 12/22/2020   PT End of Session - 12/22/20 1015     Visit Number 9    Number of Visits 12    Date for PT Re-Evaluation 01/05/21    Authorization Type UHC medicare no VL or AUth    Progress Note Due on Visit 12    PT Start Time 1010    PT Stop Time 1050    PT Time Calculation (min) 40 min    Activity Tolerance Patient tolerated treatment well    Behavior During Therapy [facility] for tasks assessed/performed             Past Medical History:  Diagnosis Date   Allergy    Anemia    Arthritis    Hypertension     Past Surgical History:  Procedure Laterality Date   COLONOSCOPY     SARK 2009     Dr. Jolee Ewing     There were no vitals filed for this visit.   Subjective Assessment - 12/22/20 1014     Subjective Patient says cramps are getting better. Sleeping has improved. Still having trouble with RT knee.    Currently in Pain? Yes    Pain Score 7     Pain Location Knee    Pain Orientation Right    Pain Descriptors / Indicators Aching    Pain Type Chronic pain    Pain Onset More than a month ago                               [facility] Adult PT Treatment/Exercise - 12/22/20 0001       Knee/Hip Exercises: Stretches   Active Hamstring Stretch Both   x20 using towel for leg position   Other Knee/Hip Stretches LTR 5 x 10"      Knee/Hip Exercises: Standing   Other Standing Knee Exercises GTB shoulder extension then row 2x 10      Knee/Hip Exercises: Supine   Bridges Both;10 reps    Other Supine Knee/Hip Exercises ab brace 10 x3", ab march x15    Other Supine Knee/Hip Exercises supine clam GTB 2 x 10 5" holds                        PT Short Term Goals - 12/08/20 1412       PT SHORT TERM GOAL #1   Title Patient will be independent in self management strategies to improve quality of life and functional outcomes.    Time 3    Period Weeks    Status Achieved    Target Date 11/26/20      PT SHORT TERM GOAL #2   Title Patient will report at least 50% improvement in overall symptoms and/or function to demonstrate improved functional mobility    Baseline 50% better    Time 3    Period Weeks    Status Achieved    Target Date 11/26/20               PT Long Term Goals - 12/08/20 1417       PT LONG TERM GOAL #1   Title Patient will  report at least 75% improvement in overall symptoms and/or function to demonstrate improved functional mobility    Time 8    Period Weeks    Status On-going      PT LONG TERM GOAL #2   Title Patient will meet predicted FOTO score to demonstrate improved overall function.    Time 8    Period Weeks    Status On-going      PT LONG TERM GOAL #3   Title Patient will be able to stand for at least 10 minutes at a time to improve ability to participate in church activities.    Baseline standign 10-15 minutes at church    Time 8    Period Weeks    Status Achieved                   Plan - 12/22/20 1100     Clinical Impression Statement Patient tolerated session well today. Progressed core strength exercise with emphasis on TA activation. Patient did note cramping in RT hamstring during bridges but improved with reps. Added active hamstring stretch which further reduced symptoms in RLE. Patient cued on proper form and body mechanics with band rows and extensions. Added to HEP and issued handout. Patient will continue to benefit from skilled therapy services to reduce deficits and improve function.    Personal Factors and Comorbidities Comorbidity 1;Age;Comorbidity 2    Comorbidities R shoulder pain    Examination-Activity Limitations Stand;Stairs;Squat;Sit;Locomotion  Level;Lift;Transfers    Examination-Participation Restrictions Church;Cleaning;Meal Prep;Community Activity;Shop    Stability/Clinical Decision Making Stable/Uncomplicated    Rehab Potential Good    PT Frequency --   additional 6 visits at 1-2x.week over the next 4 weeks.   PT Duration 4 weeks    PT Treatment/Interventions ADLs/Self Care Home Management;Electrical Stimulation;Moist Heat;Traction;Therapeutic exercise;Balance training;Manual techniques;Therapeutic activities;Parrafin;Neuromuscular re-education;Patient/family education    PT Next Visit Plan f/u wiht walking program - , hip and lumbar mobility, strengthening of hips/knee core- PRINT ALL EXERCISES, NEW EXERCISES EACH TIME, TRYING TO DEVELOP HEP    PT Home Exercise Plan prone hamstring curls, SKC;  11/02: sitting posture, bridge, active hamstring stretch, LTR, clam; 11/17 HIP ER/IR, DKC,LTR/ 11/21: hip excursions, decompression exercises, functional squat. abdominal set; 11/29 walking program; 12/15/20: Heel raise, tandem stance by counter, GTB shoulder extension and rows. 12/8: quadruped UE/LE, cat/camel, child's pose 12/13 ab brace, ab march, band clamshell    Consulted and Agree with Plan of Care Patient             Patient will benefit from skilled therapeutic intervention in order to improve the following deficits and impairments:  Pain, Decreased activity tolerance, Decreased mobility, Decreased range of motion, Decreased endurance, Decreased strength, Difficulty walking  Visit Diagnosis: Chronic midline low back pain without sciatica  Chronic pain of right knee  Muscle weakness (generalized)     Problem List Patient Active Problem List   Diagnosis Date Noted   Statin intolerance 06/28/2020   Right rotator cuff tear arthropathy 10/15/2019   Spinal stenosis, lumbar region, 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   Lumbar facet arthropathy 10/15/2019   Lumbar degenerative disc disease 10/15/2019   Chronic radicular  lumbar pain 10/15/2019   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 73/41/9379   Skin tag 04/30/2019   Depression 11/29/2017   Chronic back pain greater than 3 months duration 01/21/2017   Fall 01/21/2017   High risk medication use 01/21/2017   Alopecia 01/21/2017   Lip laceration 01/21/2017   Seasonal allergic  rhinitis due to pollen 08/30/2016   Essential hypertension 09/07/2015   Edema 05/09/2013   PTTD (posterior tibial tendon dysfunction) 05/09/2013   Primary osteoarthritis of right knee 05/09/2013   Hematoma of leg 10/04/2012   Chronic pain syndrome 10/04/2012   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   Knee pain 03/02/2011   Rotator cuff syndrome of right shoulder 06/22/2010   SPINAL STENOSIS 01/30/2008   Lumbar pain with radiation down right leg 01/30/2008   SCIATICA 01/30/2008   SPONDYLOLYSIS 01/30/2008   SHOULDER PAIN 11/28/2007   IMPINGEMENT SYNDROME 11/28/2007   DEEP VENOUS THROMBOPHLEBITIS 09/24/2007   DERANGEMENT OF ANTERIOR HORN OF LATERAL MENISCUS 06/26/2007   LOWER LEG, ARTHRITIS, DEGEN./OSTEO 05/24/2007   DERANGEMENT MENISCUS 05/24/2007   JOINT EFFUSION, RIGHT KNEE 05/24/2007   KNEE PAIN 05/24/2007   11:02 AM, 12/22/20 Josue Hector PT DPT  Physical Therapist with [facility]  [phone] [place] [address] [place], [place], [address] Phone: [phone]   Fax:  [phone]  Name: Kaylee Warner MRN: [id number] Date of Birth: 06/15/1945

## 2020-12-22 NOTE — Patient Instructions (Signed)
Access Code: [email or web address] URL: [email or web address] Date: 12/22/2020 Prepared by: Josue Hector  Exercises Supine Transversus Abdominis Bracing - Hands on Ground - 2-3 x daily - 7 x weekly - 2 sets - 10 reps - 5 second hold Supine March - 2-3 x daily - 7 x weekly - 2 sets - 10 reps Hooklying Clamshell with Resistance - 2-3 x daily - 7 x weekly - 2 sets - 10 reps - 5 second hold

## 2020-12-23 ENCOUNTER — Ambulatory Visit: Payer: Medicare Other | Admitting: Family

## 2020-12-25 ENCOUNTER — Telehealth (HOSPITAL_COMMUNITY): Payer: Self-pay | Admitting: Physical Therapy

## 2020-12-25 ENCOUNTER — Encounter (HOSPITAL_COMMUNITY): Payer: Medicare Other | Admitting: Physical Therapy

## 2020-12-25 NOTE — Telephone Encounter (Signed)
No Show. Called and left VM about missed apt and about no additional PT apts scheduled at this time and to call and reschedule.  4:41 PM, 12/25/20 Jerene Pitch, DPT Physical Therapy with [facility]  [phone] office

## 2020-12-28 ENCOUNTER — Other Ambulatory Visit: Payer: Self-pay | Admitting: Family

## 2020-12-28 DIAGNOSIS — R413 Other amnesia: Secondary | ICD-10-CM

## 2020-12-28 DIAGNOSIS — R0989 Other specified symptoms and signs involving the circulatory and respiratory systems: Secondary | ICD-10-CM

## 2020-12-29 ENCOUNTER — Other Ambulatory Visit: Payer: Self-pay | Admitting: *Deleted

## 2020-12-29 MED ORDER — PAROXETINE HCL 30 MG PO TABS: 30.0000 mg | ORAL_TABLET | Freq: Every day | ORAL | 3 refills | Status: DC

## 2020-12-29 NOTE — Telephone Encounter (Signed)
Pharmacy requested refill.  Pended Rx and sent to [facility] for approval due to HIGH ALERT Warning.  

## 2021-01-12 ENCOUNTER — Encounter: Payer: Self-pay | Admitting: Family

## 2021-01-12 ENCOUNTER — Ambulatory Visit (HOSPITAL_COMMUNITY): Payer: Medicare Other | Attending: Family | Admitting: Occupational Therapy

## 2021-01-12 ENCOUNTER — Encounter: Payer: Medicare Other | Admitting: Family

## 2021-01-12 DIAGNOSIS — M25511 Pain in right shoulder: Secondary | ICD-10-CM | POA: Insufficient documentation

## 2021-01-12 DIAGNOSIS — M25611 Stiffness of right shoulder, not elsewhere classified: Secondary | ICD-10-CM | POA: Insufficient documentation

## 2021-01-12 DIAGNOSIS — R29898 Other symptoms and signs involving the musculoskeletal system: Secondary | ICD-10-CM | POA: Insufficient documentation

## 2021-01-12 DIAGNOSIS — G8929 Other chronic pain: Secondary | ICD-10-CM | POA: Insufficient documentation

## 2021-01-13 ENCOUNTER — Other Ambulatory Visit: Payer: Self-pay

## 2021-01-13 ENCOUNTER — Ambulatory Visit (INDEPENDENT_AMBULATORY_CARE_PROVIDER_SITE_OTHER): Payer: Medicare Other | Admitting: Family

## 2021-01-13 ENCOUNTER — Encounter: Payer: Self-pay | Admitting: Family

## 2021-01-13 VITALS — BP 140/90 | HR 77 | Temp 97.1°F | Resp 16 | Ht 69.0 in | Wt 186.4 lb

## 2021-01-13 DIAGNOSIS — E782 Mixed hyperlipidemia: Secondary | ICD-10-CM

## 2021-01-13 DIAGNOSIS — J310 Chronic rhinitis: Secondary | ICD-10-CM

## 2021-01-13 DIAGNOSIS — R7303 Prediabetes: Secondary | ICD-10-CM | POA: Diagnosis not present

## 2021-01-13 DIAGNOSIS — G8929 Other chronic pain: Secondary | ICD-10-CM

## 2021-01-13 DIAGNOSIS — R413 Other amnesia: Secondary | ICD-10-CM | POA: Diagnosis not present

## 2021-01-13 DIAGNOSIS — I10 Essential (primary) hypertension: Secondary | ICD-10-CM | POA: Diagnosis not present

## 2021-01-13 DIAGNOSIS — M25511 Pain in right shoulder: Secondary | ICD-10-CM | POA: Diagnosis not present

## 2021-01-13 DIAGNOSIS — F321 Major depressive disorder, single episode, moderate: Secondary | ICD-10-CM

## 2021-01-13 NOTE — Progress Notes (Signed)
Provider: Marlowe Sax FNP-C   Giabella Duhart, Nelda Bucks, NP  Patient Care Team: Olis Viverette, Nelda Bucks, NP as PCP - General (Family Medicine)  Extended Emergency Contact Information Primary Emergency Contact: [place] Mobile Phone: [phone] Relation: Son Secondary Emergency Contact: Rueter,James R Address: [address]          [place], [place] [address] [place] Phone: [phone] Relation: Spouse  Code Status:  Full Code  Goals of care: Advanced Directive information Advanced Directives 01/13/2021  Does Patient Have a Medical Advance Directive? No  Type of Advance Directive -  Copy of [place] in Chart? -  Would patient like information on creating a medical advance directive? No - Patient declined     Chief Complaint  Patient presents with   Medical Management of Chronic Issues    6 month follow up.   Immunizations    Discuss the need for Shingrix vaccine, and Covid Booster.    HPI:  Pt is a 76 y.o. female seen today for 6 months follow up for medical management of chronic diseases.  Has had no recent hospital admission.No fall episode or weight changes. She continues with weekly physical Therapy in [place] for shoulder pain.  Has not been exercising since COVID-19 pandemic 2020 but plans to restart her exercises at the [facility].  Blood pressure high from her baseline today.  She did not take her Blood pressure medication today.denies any headache,dizziness,vision changes,fatigue,chest tightness,palpitation,chest pain or shortness of breath.     Due for shingles and 5 th COVID -19 booster vaccine.aware to get vaccine at her pharmacy.    Past Medical History:  Diagnosis Date   Allergy    Anemia    Arthritis    Hypertension    Past Surgical History:  Procedure Laterality Date   COLONOSCOPY     SARK 2009     Dr. Jolee Ewing     Allergies  Allergen Reactions   Celecoxib     REACTION: blood pressure  went up   Codeine Itching   Crestor [Rosuvastatin Calcium]     Muscle cramping and achy   Fexofenadine Other (See Comments)    unknown   Penicillins Rash    Allergies as of 01/13/2021       Reactions   Celecoxib    REACTION: blood pressure went up   Codeine Itching   Crestor [rosuvastatin Calcium]    Muscle cramping and achy   Fexofenadine Other (See Comments)   unknown   Penicillins Rash        Medication List        Accurate as of January 13, 2021  9:14 AM. If you have any questions, ask your nurse or doctor.          amLODipine 10 MG tablet Commonly known as: NORVASC Take 1/2 (one-half) tablet by mouth once daily   aspirin EC 81 MG tablet Take 1 tablet (81 mg total) by mouth daily.   cetirizine 10 MG tablet Commonly known as: ZYRTEC Take 1 tablet by mouth once daily   donepezil 10 MG tablet Commonly known as: ARICEPT TAKE 1 TABLET BY MOUTH AT BEDTIME   fluticasone 50 MCG/ACT nasal spray Commonly known as: FLONASE Use 2 spray(s) in each nostril once daily   gabapentin 300 MG capsule Commonly known as: Neurontin Take 1 capsule (300 mg total) by mouth daily as needed.   lisinopril 40 MG tablet Commonly known as: ZESTRIL Take 1 tablet (  40 mg total) by mouth daily.   meloxicam 15 MG tablet Commonly known as: MOBIC Take 1 tablet (15 mg total) by mouth daily.   PARoxetine 30 MG tablet Commonly known as: PAXIL Take 1 tablet by mouth once daily   PARoxetine 30 MG tablet Commonly known as: PAXIL Take 1 tablet (30 mg total) by mouth daily.   tiZANidine 4 MG tablet Commonly known as: ZANAFLEX Take 1 tablet (4 mg total) by mouth 2 (two) times daily as needed.   Vitamin D3 125 MCG (5000 UT) Caps Take 1 capsule (5,000 Units total) by mouth every morning.   Zinc Sulfate 220 (50 Zn) MG Tabs Take 1 tablet (220 mg total) by mouth daily.        Review of Systems  Constitutional:  Negative for appetite change, chills, fatigue, fever and unexpected  weight change.  HENT:  Negative for dental problem, ear discharge, ear pain, facial swelling, hearing loss, nosebleeds, postnasal drip, rhinorrhea, sinus pressure, sinus pain, sneezing, sore throat, tinnitus and trouble swallowing.        Chronic allergies   Eyes:  Negative for pain, discharge, redness, itching and visual disturbance.  Respiratory:  Negative for cough, chest tightness, shortness of breath and wheezing.   Cardiovascular:  Negative for chest pain, palpitations and leg swelling.  Gastrointestinal:  Negative for abdominal distention, abdominal pain, blood in stool, constipation, diarrhea, nausea and vomiting.  Endocrine: Negative for cold intolerance, heat intolerance, polydipsia, polyphagia and polyuria.  Genitourinary:  Negative for difficulty urinating, dysuria, flank pain, frequency and urgency.  Musculoskeletal:  Positive for arthralgias and back pain. Negative for gait problem, joint swelling, myalgias, neck pain and neck stiffness.  Skin:  Negative for color change, pallor, rash and wound.  Neurological:  Negative for dizziness, syncope, speech difficulty, weakness, light-headedness, numbness and headaches.  Hematological:  Does not bruise/bleed easily.  Psychiatric/Behavioral:  Negative for agitation, behavioral problems, confusion, hallucinations, self-injury, sleep disturbance and suicidal ideas. The patient is not nervous/anxious.        Memory lapse    Immunization History  Administered Date(s) Administered   Fluad Quad(high Dose 65+) 10/29/2018, 10/14/2020   Influenza, High Dose Seasonal PF 11/24/2017   Influenza,inj,Quad PF,6+ Mos 09/24/2015, 12/15/2016   Moderna Sars-Covid-2 Vaccination 03/29/2019, 04/26/2019, 11/23/2019, 12/17/2019   Pneumococcal Conjugate-13 12/16/2015   Pneumococcal Polysaccharide-23 06/27/2018   Td 01/17/2017   Tdap 11/23/2010   Pertinent  Health Maintenance Due  Topic Date Due   COLONOSCOPY (Pts 45-78yr Insurance coverage will need to be  confirmed)  10/17/2021   INFLUENZA VACCINE  Completed   DEXA SCAN  Completed   Fall Risk 07/15/2020 09/02/2020 10/14/2020 01/12/2021 01/13/2021  Falls in the past year? 0 0 0 0 0  Number of falls in past year - - - - -  Was there an injury with Fall? 0 - 0 0 0  Was there an injury with Fall? - - - - -  Fall Risk Category Calculator 0 - 0 0 0  Fall Risk Category Low - Low Low Low  Patient Fall Risk Level Low fall risk Low fall risk Low fall risk Low fall risk Low fall risk  Patient at Risk for Falls Due to No Fall Risks No Fall Risks No Fall Risks No Fall Risks No Fall Risks  Fall risk Follow up Falls evaluation completed Falls evaluation completed Falls evaluation completed Falls evaluation completed Falls evaluation completed   Functional Status Survey:    Vitals:   01/13/21 0900  BP: 140/90  Pulse: 77  Resp: 16  Temp: (!) 97.1 F (36.2 C)  SpO2: 94%  Weight: 186 lb 6.4 oz (84.6 kg)  Height: '5\' 9"'  (1.753 m)   Body mass index is 27.53 kg/m. Physical Exam Vitals reviewed.  Constitutional:      General: She is not in acute distress.    Appearance: Normal appearance. She is overweight. She is not ill-appearing or diaphoretic.  HENT:     Head: Normocephalic.     Right Ear: Tympanic membrane, ear canal and external ear normal. There is no impacted cerumen.     Left Ear: Tympanic membrane, ear canal and external ear normal. There is no impacted cerumen.     Nose: Nose normal. No congestion or rhinorrhea.     Right Turbinates: Enlarged and pale.     Left Turbinates: Enlarged and pale.     Right Sinus: No maxillary sinus tenderness or frontal sinus tenderness.     Left Sinus: No maxillary sinus tenderness or frontal sinus tenderness.     Mouth/Throat:     Mouth: Mucous membranes are moist.     Pharynx: Oropharynx is clear. No oropharyngeal exudate or posterior oropharyngeal erythema.  Eyes:     General: No scleral icterus.       Right eye: No discharge.        Left eye: No  discharge.     Extraocular Movements: Extraocular movements intact.     Conjunctiva/sclera: Conjunctivae normal.     Pupils: Pupils are equal, round, and reactive to light.  Neck:     Vascular: No carotid bruit.  Cardiovascular:     Rate and Rhythm: Normal rate and regular rhythm.     Pulses: Normal pulses.     Heart sounds: Normal heart sounds. No murmur heard.   No friction rub. No gallop.  Pulmonary:     Effort: Pulmonary effort is normal. No respiratory distress.     Breath sounds: Normal breath sounds. No wheezing, rhonchi or rales.  Chest:     Chest wall: No tenderness.  Abdominal:     General: Bowel sounds are normal. There is no distension.     Palpations: Abdomen is soft. There is no mass.     Tenderness: There is no abdominal tenderness. There is no right CVA tenderness, left CVA tenderness, guarding or rebound.  Musculoskeletal:        General: No swelling or tenderness. Normal range of motion.     Cervical back: Normal range of motion. No rigidity or tenderness.     Right lower leg: No edema.     Left lower leg: No edema.  Lymphadenopathy:     Cervical: No cervical adenopathy.  Skin:    General: Skin is warm and dry.     Coloration: Skin is not pale.     Findings: No bruising, erythema, lesion or rash.  Neurological:     Mental Status: She is alert. Mental status is at baseline.     Cranial Nerves: No cranial nerve deficit.     Sensory: No sensory deficit.     Motor: No weakness.     Coordination: Coordination normal.     Gait: Gait normal.  Psychiatric:        Mood and Affect: Mood normal.        Speech: Speech normal.        Behavior: Behavior normal.        Thought Content: Thought content normal.        Cognition  and Memory: She exhibits impaired recent memory.        Judgment: Judgment normal.    Labs reviewed: Recent Labs    10/14/20 1544  NA 140  K 4.3  CL 107  CO2 22  GLUCOSE 92  BUN 15  CREATININE 0.79  CALCIUM 9.7   Recent Labs     10/14/20 1544  AST 16  ALT 10  BILITOT 0.7  PROT 6.8   No results for input(s): WBC, NEUTROABS, HGB, HCT, MCV, PLT in the last 8760 hours. Lab Results  Component Value Date   TSH 1.88 05/08/2019   No results found for: HGBA1C Lab Results  Component Value Date   CHOL 244 (H) 06/15/2020   HDL 66 06/15/2020   LDLCALC 159 (H) 06/15/2020   TRIG 89 06/15/2020   CHOLHDL 3.7 06/15/2020    Significant Diagnostic Results in last 30 days:  No results found.  Assessment/Plan 1. Essential hypertension B/p elevated this visit.has not taken her b/p medication.No home readings for evaluation. - continue on Amlodipine and lisinopril - continue on ASA. Not on statin due to muscle cramping with statin.  - TSH - CMP with eGFR([facility]) - CBC with Differential/Platelet  2. Mixed hyperlipidemia Previous LDL 159  Intolerant to statin but request LDL to be rechecked today.  - Lipid panel  3. Chronic right shoulder pain - continue with Physical Therapy  - continue Gabapentin and Meloxicam   4. Noninfectious nonallergic rhinitis Bilateral anterior Turbinates are enlarged and pale.No erythema. - continue on Flonase and Cetirizine   6. Memory loss or impairment Continue on donepezil    Family/ staff Communication: Reviewed plan of care with patient verbalized understanding   Labs/tests ordered: Has lab orders   Next Appointment : 6 months for medical management of chronic issues with Fasting Labs.    Sandrea Hughs, NP

## 2021-01-13 NOTE — Patient Instructions (Signed)
-   please get shingles vaccine and 5 th COVID-19 booster.

## 2021-01-14 ENCOUNTER — Ambulatory Visit (HOSPITAL_COMMUNITY): Payer: Medicare Other | Admitting: Occupational Therapy

## 2021-01-14 ENCOUNTER — Encounter (HOSPITAL_COMMUNITY): Payer: Self-pay | Admitting: Occupational Therapy

## 2021-01-14 DIAGNOSIS — M25611 Stiffness of right shoulder, not elsewhere classified: Secondary | ICD-10-CM

## 2021-01-14 DIAGNOSIS — R29898 Other symptoms and signs involving the musculoskeletal system: Secondary | ICD-10-CM

## 2021-01-14 DIAGNOSIS — M25511 Pain in right shoulder: Secondary | ICD-10-CM | POA: Diagnosis not present

## 2021-01-14 DIAGNOSIS — G8929 Other chronic pain: Secondary | ICD-10-CM | POA: Diagnosis not present

## 2021-01-14 NOTE — Patient Instructions (Signed)
°  1) Flexion Wall Stretch    Face wall, place affected handon wall in front of you. Slide hand up the wall  and lean body in towards the wall. Hold for 10 seconds. Repeat 3-5 times. 1-2 times/day.     2) Towel Stretch with Internal Rotation     Gently pull up (or to the side) your affected arm  behind your back with the assist of a towel. Hold 10 seconds, repeat 3-5 times. 1-2 times/day.             3) Corner Stretch    Stand at a corner of a wall, place your arms on the walls with elbows bent. Lean into the corner until a stretch is felt along the front of your chest and/or shoulders. Hold for 10 seconds. Repeat 3-5X, 1-2 times/day.    4) Posterior Capsule Stretch    Bring the involved arm across chest. Grasp elbow and pull toward chest until you feel a stretch in the back of the upper arm and shoulder. Hold 10 seconds. Repeat 3-5X. Complete 1-2 times/day.      Repeat all exercises 10-15 times, 1-2 times per day.  1) Shoulder Protraction    Begin with elbows by your side, slowly "punch" straight out in front of you.      2) Shoulder Flexion  Standing:         Begin with arms at your side with thumbs pointed up, slowly raise both arms up and forward towards overhead.               3) Horizontal abduction/adduction  Standing:           Begin with arms straight out in front of you, bring out to the side in at "T" shape. Keep arms straight entire time.                 4) Internal & External Rotation   Standing:     Stand with elbows at the side and elbows bent 90 degrees. Move your forearms away from your body, then bring back inward toward the body.     5) Shoulder Abduction  Standing:       Lying on your back begin with your arms flat on the table next to your side. Slowly move your arms out to the side so that they go overhead, in a jumping jack or snow angel movement.

## 2021-01-14 NOTE — Therapy (Addendum)
[place], [place], [address] Phone: [phone]   Fax:  [phone]  Occupational Therapy Evaluation  Patient Details  Name: Kaylee Warner MRN: [id number] Date of Birth: 05-25-1945 Referring Provider (OT): Marlowe Sax, NP   OCCUPATIONAL THERAPY DISCHARGE SUMMARY  Visits from Start of Care: 1  Current functional level related to goals / functional outcomes: Unknown. Pt did not return for any visits after evaluation.    Remaining deficits: Unknown.    Education / Equipment: HEP   Patient agrees to discharge. Patient goals were not met. Patient is being discharged due to not returning since the last visit..     Encounter Date: 01/14/2021   OT End of Session - 01/14/21 1417     Visit Number 1    Number of Visits 2    Date for OT Re-Evaluation 02/13/21    Authorization Type [employer] Medicare, $20 copay    Progress Note Due on Visit 10    OT Start Time 1308    OT Stop Time 1340    OT Time Calculation (min) 32 min    Activity Tolerance Patient tolerated treatment well    Behavior During Therapy [facility] for tasks assessed/performed             Past Medical History:  Diagnosis Date   Allergy    Anemia    Arthritis    Hypertension     Past Surgical History:  Procedure Laterality Date   COLONOSCOPY     SARK 2009     Dr. Jolee Ewing     There were no vitals filed for this visit.   Subjective Assessment - 01/14/21 1408     Subjective  S: I have tears but I'm not having surgery.    Pertinent History Pt is a 76 y/o female presenting with right shoulder pain since 2019, MRI in 2019 shows multiple full and partial tears but pt declined surgical repair. Pt was referred to occupational therapy for evaluation and treatment by Marlowe Sax, NP.    Special Tests FOTO: 65/100    Patient Stated Goals To have less pain.    Currently in Pain? Yes    Pain Score 3     Pain Location Shoulder    Pain Orientation  Right    Pain Descriptors / Indicators Aching    Pain Type Chronic pain    Pain Radiating Towards neck    Pain Onset More than a month ago    Pain Frequency Intermittent    Aggravating Factors  increased use    Pain Relieving Factors ice and heat    Effect of Pain on Daily Activities min/mod effect on ADLs    Multiple Pain Sites No               [facility] OT Assessment - 01/14/21 1308       Assessment   Medical Diagnosis right shoulder pain    Referring Provider (OT) Marlowe Sax, NP    Onset Date/Surgical Date 01/11/20   approximately 1 year ago   Hand Dominance Right    Prior Therapy None      Precautions   Precautions None      Restrictions   Weight Bearing Restrictions No      Balance Screen   Has the patient fallen in the past 6 months No      Prior Function   Level of Independence Independent    Vocation Unemployed    Leisure  Going [employer], cooking      ADL   ADL comments Pt is having difficulty with tasks such as pulling socks up, lifting tasks, carrying tasks. Pt has difficulty with sleeping.      Written Expression   Dominant Hand Right      Cognition   Overall Cognitive Status Within Functional Limits for tasks assessed      Observation/Other Assessments   Focus on Therapeutic Outcomes (FOTO)  65/100      ROM / Strength   AROM / PROM / Strength AROM;Strength      AROM   Overall AROM Comments Assessed seated, er/IR adducted    AROM Assessment Site Shoulder    Right/Left Shoulder Right    Right Shoulder Flexion 130 Degrees    Right Shoulder ABduction 129 Degrees    Right Shoulder Internal Rotation 90 Degrees    Right Shoulder External Rotation 70 Degrees      Strength   Overall Strength Comments Assessed seated, er/IR adducted    Strength Assessment Site Shoulder    Right/Left Shoulder Right    Right Shoulder Flexion 4+/5    Right Shoulder ABduction 4/5    Right Shoulder Internal Rotation 5/5    Right Shoulder External Rotation 4+/5                               OT Education - 01/14/21 1408     Education Details shoulder stretches and A/ROM    Person(s) Educated Patient    Methods Explanation;Demonstration;Handout    Comprehension Verbalized understanding;Returned demonstration              OT Short Term Goals - 01/14/21 1421       OT SHORT TERM GOAL #1   Title Pt will be provided with and educated on HEP to improve mobility of RUE required for ADL completion.    Time 4    Period Weeks    Status New    Target Date 02/13/21      OT SHORT TERM GOAL #2   Title Pt will decrease pain in RUE to 3/10 or less to improve ability to sleep for 2+ hours at night without waking due to shoulder pain.    Time 4    Period Weeks    Status New      OT SHORT TERM GOAL #3   Title Pt will increase RUE A/ROM by 10+ degrees to improve ROM required for overhead reaching tasks.    Time 4    Period Weeks    Status New                      Plan - 01/14/21 1417     Clinical Impression Statement A: Pt is a 76 y/o female presenting with right shoulder pain, pt with confirmed RC tears both full and partial thickness. Pt reports she is not interested in surgery and would like exercises to help with pain and use at this time. Discussed expectation of some pain and soreness with increased use due to the nature of having torn RC. Pt demonstrates ROM and strength WFL, pt verbalized understanding and would like to try an HEP with a follow up visit in one month.    OT Occupational Profile and History Problem Focused Assessment - Including review of records relating to presenting problem    Occupational performance deficits (Please refer to evaluation for details): ADL's;IADL's;Rest and Sleep;Work  Body Structure / Function / Physical Skills ADL;Endurance;UE functional use;Fascial restriction;Pain;ROM;IADL;Strength    Rehab Potential Good    Clinical Decision Making Limited treatment options, no task  modification necessary    Comorbidities Affecting Occupational Performance: None    Modification or Assistance to Complete Evaluation  No modification of tasks or assist necessary to complete eval    OT Frequency Monthly    OT Duration 4 weeks    OT Treatment/Interventions Self-care/ADL training;Ultrasound;DME and/or AE instruction;Patient/family education;Passive range of motion;Cryotherapy;Electrical Stimulation;Moist Heat;Therapeutic exercise;Manual Therapy;Therapeutic activities    Plan P: Pt will benefit from skilled OT services to decrease pain and fascial restrictions and improve RUE strength and functional use during ADLs. Treatment plan: HEP with follow up in one month    OT Home Exercise Plan eval: shoulder stretches, A/ROM    Consulted and Agree with Plan of Care Patient             Patient will benefit from skilled therapeutic intervention in order to improve the following deficits and impairments:   Body Structure / Function / Physical Skills: ADL, Endurance, UE functional use, Fascial restriction, Pain, ROM, IADL, Strength       Visit Diagnosis: Chronic right shoulder pain  Stiffness of right shoulder, not elsewhere classified  Other symptoms and signs involving the musculoskeletal system    Problem List Patient Active Problem List   Diagnosis Date Noted   Statin intolerance 06/28/2020   Right rotator cuff tear arthropathy 10/15/2019   Spinal stenosis, lumbar region, 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   Lumbar facet arthropathy 10/15/2019   Lumbar degenerative disc disease 10/15/2019   Chronic radicular lumbar pain 10/15/2019   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 62/37/6283   Skin tag 04/30/2019   Depression 11/29/2017   Chronic back pain greater than 3 months duration 01/21/2017   Fall 01/21/2017   High risk medication use 01/21/2017   Alopecia 01/21/2017   Seasonal allergic rhinitis due to pollen 08/30/2016   Essential  hypertension 09/07/2015   Edema 05/09/2013   PTTD (posterior tibial tendon dysfunction) 05/09/2013   Primary osteoarthritis of right knee 05/09/2013   Hematoma of leg 10/04/2012   Chronic pain syndrome 10/04/2012   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   Knee pain 03/02/2011   Rotator cuff syndrome of right shoulder 06/22/2010   SPINAL STENOSIS 01/30/2008   Lumbar pain with radiation down right leg 01/30/2008   SCIATICA 01/30/2008   SPONDYLOLYSIS 01/30/2008   SHOULDER PAIN 11/28/2007   IMPINGEMENT SYNDROME 11/28/2007   DEEP VENOUS THROMBOPHLEBITIS 09/24/2007   DERANGEMENT OF ANTERIOR HORN OF LATERAL MENISCUS 06/26/2007   LOWER LEG, ARTHRITIS, DEGEN./OSTEO 05/24/2007   DERANGEMENT MENISCUS 05/24/2007   JOINT EFFUSION, RIGHT KNEE 05/24/2007   KNEE PAIN 05/24/2007    Guadelupe Sabin, OTR/L  [phone] 01/14/2021, 2:23 PM  [facility] [address] [place], [place], [address] Phone: [phone]   Fax:  [phone]  Name: Kaylee Warner MRN: [id number] Date of Birth: 1945-05-28

## 2021-01-18 ENCOUNTER — Telehealth: Payer: Self-pay | Admitting: *Deleted

## 2021-01-18 ENCOUNTER — Other Ambulatory Visit: Payer: Self-pay

## 2021-01-18 DIAGNOSIS — E782 Mixed hyperlipidemia: Secondary | ICD-10-CM

## 2021-01-18 MED ORDER — ATORVASTATIN CALCIUM 10 MG PO TABS: 10.0000 mg | ORAL_TABLET | ORAL | 1 refills | Status: DC

## 2021-01-18 NOTE — Telephone Encounter (Signed)
See lab resulted.

## 2021-01-18 NOTE — Telephone Encounter (Signed)
Patient called requesting her lab results from last week.   Please Advise.

## 2021-01-19 ENCOUNTER — Encounter (HOSPITAL_COMMUNITY): Payer: Medicare Other | Admitting: Occupational Therapy

## 2021-01-19 NOTE — Telephone Encounter (Signed)
Kaylee Warner spoke with patient and gave lab results.

## 2021-01-20 LAB — TEST AUTHORIZATION

## 2021-01-20 LAB — COMPLETE METABOLIC PANEL WITH GFR
AG Ratio: 1.7 (calc) (ref 1.0–2.5)
ALT: 10 U/L (ref 6–29)
AST: 15 U/L (ref 10–35)
Albumin: 4.2 g/dL (ref 3.6–5.1)
Alkaline phosphatase (APISO): 67 U/L (ref 37–153)
BUN: 10 mg/dL (ref 7–25)
CO2: 26 mmol/L (ref 20–32)
Calcium: 9.5 mg/dL (ref 8.6–10.4)
Chloride: 106 mmol/L (ref 98–110)
Creat: 0.74 mg/dL (ref 0.60–1.00)
Globulin: 2.5 g/dL (calc) (ref 1.9–3.7)
Glucose, Bld: 106 mg/dL — ABNORMAL HIGH (ref 65–99)
Potassium: 4.1 mmol/L (ref 3.5–5.3)
Sodium: 142 mmol/L (ref 135–146)
Total Bilirubin: 0.6 mg/dL (ref 0.2–1.2)
Total Protein: 6.7 g/dL (ref 6.1–8.1)
eGFR: 84 mL/min/{1.73_m2} (ref >=60)

## 2021-01-20 LAB — CBC WITH DIFFERENTIAL/PLATELET
Absolute Monocytes: 380 cells/uL (ref 200–950)
Basophils Absolute: 39 cells/uL (ref 0–200)
Basophils Relative: 0.7 %
Eosinophils Absolute: 248 cells/uL (ref 15–500)
Eosinophils Relative: 4.5 %
HCT: 40.5 % (ref 35.0–45.0)
Hemoglobin: 12.6 g/dL (ref 11.7–15.5)
Lymphs Abs: 1502 cells/uL (ref 850–3900)
MCH: 24.3 pg — ABNORMAL LOW (ref 27.0–33.0)
MCHC: 31.1 g/dL — ABNORMAL LOW (ref 32.0–36.0)
MCV: 78 fL — ABNORMAL LOW (ref 80.0–100.0)
MPV: 10.8 fL (ref 7.5–12.5)
Monocytes Relative: 6.9 %
Neutro Abs: 3333 cells/uL (ref 1500–7800)
Neutrophils Relative %: 60.6 %
Platelets: 322 10*3/uL (ref 140–400)
RBC: 5.19 10*6/uL — ABNORMAL HIGH (ref 3.80–5.10)
RDW: 16.5 % — ABNORMAL HIGH (ref 11.0–15.0)
Total Lymphocyte: 27.3 %
WBC: 5.5 10*3/uL (ref 3.8–10.8)

## 2021-01-20 LAB — TSH: TSH: 1.86 mIU/L (ref 0.40–4.50)

## 2021-01-20 LAB — LIPID PANEL
Cholesterol: 232 mg/dL — ABNORMAL HIGH (ref <200)
HDL: 55 mg/dL (ref >=50)
LDL Cholesterol (Calc): 154 mg/dL (calc) — ABNORMAL HIGH
Non-HDL Cholesterol (Calc): 177 mg/dL (calc) — ABNORMAL HIGH (ref <130)
Total CHOL/HDL Ratio: 4.2 (calc) (ref <5.0)
Triglycerides: 116 mg/dL (ref <150)

## 2021-01-20 LAB — HEMOGLOBIN A1C W/OUT EAG: Hgb A1c MFr Bld: 5.9 % of total Hgb — ABNORMAL HIGH (ref <5.7)

## 2021-01-21 ENCOUNTER — Encounter (HOSPITAL_COMMUNITY): Payer: Medicare Other | Admitting: Occupational Therapy

## 2021-01-26 ENCOUNTER — Encounter (HOSPITAL_COMMUNITY): Payer: Medicare Other | Admitting: Occupational Therapy

## 2021-01-28 ENCOUNTER — Encounter (HOSPITAL_COMMUNITY): Payer: Medicare Other | Admitting: Occupational Therapy

## 2021-01-29 ENCOUNTER — Encounter (HOSPITAL_COMMUNITY): Payer: Self-pay | Admitting: Physical Therapy

## 2021-01-29 NOTE — Therapy (Signed)
[place], [place], [address] Phone: [phone]   Fax:  [phone]  Patient Details  Name: Kaylee Warner MRN: [id number] Date of Birth: 1945/12/20 Referring Provider:  No ref. provider found  Encounter Date: 01/29/2021    PHYSICAL THERAPY DISCHARGE SUMMARY  Visits from Start of Care: 9  Current functional level related to goals / functional outcomes: Improved ROM and pain    Remaining deficits: Continues to have some pain   Education / Equipment: HEP   Patient agrees to discharge. Patient goals were partially met. Patient is being discharged due to not returning since the last visit.  Rayetta Humphrey, PT CLT [id number]  01/29/2021, 2:03 PM  [facility] [address] [place], [place], [address] Phone: [phone]   Fax:  [phone]

## 2021-01-30 ENCOUNTER — Other Ambulatory Visit: Payer: Self-pay | Admitting: Family

## 2021-01-30 DIAGNOSIS — J310 Chronic rhinitis: Secondary | ICD-10-CM

## 2021-01-30 DIAGNOSIS — G894 Chronic pain syndrome: Secondary | ICD-10-CM

## 2021-02-02 ENCOUNTER — Ambulatory Visit (HOSPITAL_COMMUNITY): Payer: Medicare Other | Admitting: Occupational Therapy

## 2021-02-04 ENCOUNTER — Encounter (HOSPITAL_COMMUNITY): Payer: Medicare Other | Admitting: Occupational Therapy

## 2021-02-09 NOTE — Progress Notes (Signed)
  This encounter was created in error - please disregard. No show 

## 2021-02-10 ENCOUNTER — Encounter (HOSPITAL_COMMUNITY): Payer: Medicare Other | Admitting: Occupational Therapy

## 2021-02-12 ENCOUNTER — Encounter: Payer: Self-pay | Admitting: Family

## 2021-02-12 ENCOUNTER — Telehealth (INDEPENDENT_AMBULATORY_CARE_PROVIDER_SITE_OTHER): Payer: Medicare Other | Admitting: Family

## 2021-02-12 ENCOUNTER — Other Ambulatory Visit: Payer: Self-pay

## 2021-02-12 DIAGNOSIS — H1031 Unspecified acute conjunctivitis, right eye: Secondary | ICD-10-CM | POA: Diagnosis not present

## 2021-02-12 MED ORDER — GENTAMICIN SULFATE 0.3 % OP SOLN: 1.0000 [drp] | Freq: Three times a day (TID) | OPHTHALMIC | 0 refills | Status: DC

## 2021-02-12 MED ORDER — GENTAMICIN SULFATE 0.3 % OP SOLN: 1.0000 [drp] | Freq: Three times a day (TID) | OPHTHALMIC | 0 refills | Status: AC

## 2021-02-12 NOTE — Patient Instructions (Addendum)
--    cleansed eye with warm wet wash cloth prior to applying eye drops. - hand hygiene advised to prevent spread to left eye and others in the house.

## 2021-02-12 NOTE — Progress Notes (Signed)
This service is provided via telemedicine  No vital signs collected/recorded due to the encounter was a telemedicine visit.   Location of patient (ex: home, work):  Home.  Patient consents to a telephone visit:  Yes  Location of the provider (ex: office, home):  [employer].  Name of any referring provider:  Zohar Maroney, Nelda Bucks, NP   Names of all persons participating in the telemedicine service and their role in the encounter:  Patient, Heriberto Antigua, [place], [place], Webb Silversmith, NP.    Time spent on call: 8 minutes spent on the phone with Medical Assistant.      Provider: Marlowe Sax FNP-C  Caleb Decock, Nelda Bucks, NP  Patient Care Team: Baudelia Schroepfer, Nelda Bucks, NP as PCP - General (Family Medicine)  Extended Emergency Contact Information Primary Emergency Contact: [place] of [place] Mobile Phone: [phone] Relation: Son Secondary Emergency Contact: Orbach,James R Address: [place]          [place], [place] [address] [place] Phone: [phone] Relation: Spouse  Code Status: Full Code  Goals of care: Advanced Directive information Advanced Directives 02/12/2021  Does Patient Have a Medical Advance Directive? Yes  Type of Paramedic of [place];Living will;Out of facility DNR (pink MOST or yellow form)  Does patient want to make changes to medical advance directive? No - Patient declined  Copy of [place] in Chart? No - copy requested  Would patient like information on creating a medical advance directive? No - Patient declined     Chief Complaint  Patient presents with   Acute Visit    Patient complains of right eye redness, drainage, and irritation. Patient woke up with eye closed shut. Ongoing for two weeks now.     HPI:  Pt is a 76 y.o. female seen today for an acute visit for evaluation of right eye redness,drainage and irritation.states woke up  with eye stacked could not  open eye.Symptoms have been going on  for two weeks.A Nurse practitioner at her church told her it was Pink eye and need to notify provider. She denies any vision changes,fever,chills or URI's.    Past Medical History:  Diagnosis Date   Allergy    Anemia    Arthritis    Hypertension    Past Surgical History:  Procedure Laterality Date   COLONOSCOPY     SARK 2009     Dr. Jolee Ewing     Allergies  Allergen Reactions   Celecoxib     REACTION: blood pressure went up   Codeine Itching   Crestor [Rosuvastatin Calcium]     Muscle cramping and achy   Fexofenadine Other (See Comments)    unknown   Penicillins Rash    Outpatient Encounter Medications as of 02/12/2021  Medication Sig   amLODipine (NORVASC) 10 MG tablet Take 1/2 (one-half) tablet by mouth once daily   aspirin EC 81 MG tablet Take 1 tablet (81 mg total) by mouth daily.   atorvastatin (LIPITOR) 10 MG tablet Take 1 tablet (10 mg total) by mouth 3 (three) times a week.   cetirizine (ZYRTEC) 10 MG tablet Take 1 tablet by mouth once daily   Cholecalciferol (VITAMIN D3) 125 MCG (5000 UT) CAPS Take 1 capsule (5,000 Units total) by mouth every morning.   donepezil (ARICEPT) 10 MG tablet TAKE 1 TABLET BY MOUTH AT BEDTIME   fluticasone (FLONASE) 50 MCG/ACT nasal spray Use 2 spray(s) in each nostril once daily  gabapentin (NEURONTIN) 300 MG capsule TAKE 1 CAPSULE BY MOUTH ONCE DAILY AS NEEDED   lisinopril (ZESTRIL) 40 MG tablet Take 1 tablet (40 mg total) by mouth daily.   meloxicam (MOBIC) 15 MG tablet Take 1 tablet (15 mg total) by mouth daily.   PARoxetine (PAXIL) 30 MG tablet Take 1 tablet by mouth once daily   tiZANidine (ZANAFLEX) 4 MG tablet Take 1 tablet (4 mg total) by mouth 2 (two) times daily as needed.   Zinc Sulfate 220 (50 Zn) MG TABS Take 1 tablet (220 mg total) by mouth daily.   [DISCONTINUED] PARoxetine (PAXIL) 30 MG tablet Take 1 tablet (30 mg total) by mouth daily.   No facility-administered encounter  medications on file as of 02/12/2021.    Review of Systems  Constitutional:  Negative for appetite change, chills, fatigue and fever.  HENT:  Negative for congestion.   Eyes:  Positive for pain, redness and itching. Negative for discharge.  Respiratory:  Negative for cough, chest tightness, shortness of breath and wheezing.   Neurological:  Negative for dizziness, light-headedness and headaches.   Immunization History  Administered Date(s) Administered   Fluad Quad(high Dose 65+) 10/29/2018, 10/14/2020   Influenza, High Dose Seasonal PF 11/24/2017   Influenza,inj,Quad PF,6+ Mos 09/24/2015, 12/15/2016   Moderna Sars-Covid-2 Vaccination 03/29/2019, 04/26/2019, 11/23/2019, 12/17/2019   Pneumococcal Conjugate-13 12/16/2015   Pneumococcal Polysaccharide-23 06/27/2018   Td 01/17/2017   Tdap 11/23/2010   Pertinent  Health Maintenance Due  Topic Date Due   COLONOSCOPY (Pts 45-47yrs Insurance coverage will need to be confirmed)  10/17/2021   INFLUENZA VACCINE  Completed   DEXA SCAN  Completed   Fall Risk 09/02/2020 10/14/2020 01/12/2021 01/13/2021 02/12/2021  Falls in the past year? 0 0 0 0 0  Number of falls in past year - - - - -  Was there an injury with Fall? - 0 0 0 0  Was there an injury with Fall? - - - - -  Fall Risk Category Calculator - 0 0 0 0  Fall Risk Category - Low Low Low Low  Patient Fall Risk Level Low fall risk Low fall risk Low fall risk Low fall risk Low fall risk  Patient at Risk for Falls Due to No Fall Risks No Fall Risks No Fall Risks No Fall Risks No Fall Risks  Fall risk Follow up Falls evaluation completed Falls evaluation completed Falls evaluation completed Falls evaluation completed Falls evaluation completed   Functional Status Survey:    There were no vitals filed for this visit. There is no height or weight on file to calculate BMI. Physical Exam  Unable to complete on telephone visit.   Labs reviewed: Recent Labs    10/14/20 1544 01/13/21 0936  NA  140 142  K 4.3 4.1  CL 107 106  CO2 22 26  GLUCOSE 92 106*  BUN 15 10  CREATININE 0.79 0.74  CALCIUM 9.7 9.5   Recent Labs    10/14/20 1544 01/13/21 0936  AST 16 15  ALT 10 10  BILITOT 0.7 0.6  PROT 6.8 6.7   Recent Labs    01/13/21 0936  WBC 5.5  NEUTROABS 3,333  HGB 12.6  HCT 40.5  MCV 78.0*  PLT 322   Lab Results  Component Value Date   TSH 1.86 01/13/2021   Lab Results  Component Value Date   HGBA1C 5.9 (H) 01/13/2021   Lab Results  Component Value Date   CHOL 232 (H) 01/13/2021   HDL  55 01/13/2021   LDLCALC 154 (H) 01/13/2021   TRIG 116 01/13/2021   CHOLHDL 4.2 01/13/2021    Significant Diagnostic Results in last 30 days:  No results found.  Assessment/Plan  Acute bacterial conjunctivitis of right eye Reports right eye redness,itching,painful  with whitish -yellow drainage stuck on eyelashes in the morning.  - advised to cleansed eye with warm wet wash cloth prior to applying eye drops. - hand hygiene advised to prevent spread to left eye and others in the house.  Start on Garamycin as below.  - gentamicin (GARAMYCIN) 0.3 % ophthalmic solution; Place 1 drop into the right eye 3 (three) times daily for 7 days.  Dispense: 1.1 mL; Refill: 0 - Notify provider if symptoms worsen or fail to improve   Family/ staff Communication: Reviewed plan of care with patient verbalized understanding   Labs/tests ordered: None   Next Appointment: As needed if symptoms worsen or fail to improve   I connected with  Fredderick Erb on 02/12/21 by a Telephone enabled telemedicine application and verified that I am speaking with the correct person using two identifiers.   I discussed the limitations of evaluation and management by telemedicine. The patient expressed understanding and agreed to proceed.  Spent 11 minutes of non-face to face with patient  >50% time spent counseling and developing future plan of care.     Sandrea Hughs, NP

## 2021-02-16 DIAGNOSIS — M5416 Radiculopathy, lumbar region: Secondary | ICD-10-CM | POA: Diagnosis not present

## 2021-03-11 ENCOUNTER — Other Ambulatory Visit: Payer: Self-pay | Admitting: Student in an Organized Health Care Education/Training Program

## 2021-03-11 DIAGNOSIS — M5416 Radiculopathy, lumbar region: Secondary | ICD-10-CM

## 2021-03-17 ENCOUNTER — Telehealth: Payer: Self-pay | Admitting: *Deleted

## 2021-03-17 NOTE — Telephone Encounter (Signed)
Attempted to call for pre appointment review of allergies/meds. Message left. 

## 2021-03-18 ENCOUNTER — Ambulatory Visit
Payer: Medicare Other | Attending: Student in an Organized Health Care Education/Training Program | Admitting: Student in an Organized Health Care Education/Training Program

## 2021-03-18 ENCOUNTER — Other Ambulatory Visit: Payer: Self-pay

## 2021-03-18 DIAGNOSIS — M5416 Radiculopathy, lumbar region: Secondary | ICD-10-CM

## 2021-03-18 NOTE — Progress Notes (Signed)
I attempted to call the patient however no response. Voicemail left instructing patient to call front desk office at [phone] to reschedule appointment. -Dr Ariauna Farabee  

## 2021-04-06 ENCOUNTER — Ambulatory Visit
Payer: Medicare Other | Attending: Student in an Organized Health Care Education/Training Program | Admitting: Student in an Organized Health Care Education/Training Program

## 2021-04-06 ENCOUNTER — Encounter: Payer: Self-pay | Admitting: Student in an Organized Health Care Education/Training Program

## 2021-04-06 ENCOUNTER — Other Ambulatory Visit: Payer: Self-pay

## 2021-04-06 VITALS — BP 167/98 | HR 85 | Temp 97.4°F | Resp 16 | Ht 69.0 in | Wt 180.0 lb

## 2021-04-06 DIAGNOSIS — M75101 Unspecified rotator cuff tear or rupture of right shoulder, not specified as traumatic: Secondary | ICD-10-CM

## 2021-04-06 DIAGNOSIS — M12811 Other specific arthropathies, not elsewhere classified, right shoulder: Secondary | ICD-10-CM | POA: Diagnosis not present

## 2021-04-06 DIAGNOSIS — M1711 Unilateral primary osteoarthritis, right knee: Secondary | ICD-10-CM

## 2021-04-06 DIAGNOSIS — M19011 Primary osteoarthritis, right shoulder: Secondary | ICD-10-CM | POA: Diagnosis not present

## 2021-04-06 DIAGNOSIS — M5416 Radiculopathy, lumbar region: Secondary | ICD-10-CM | POA: Diagnosis not present

## 2021-04-06 DIAGNOSIS — G894 Chronic pain syndrome: Secondary | ICD-10-CM | POA: Diagnosis not present

## 2021-04-06 MED ORDER — TIZANIDINE HCL 4 MG PO TABS: 4.0000 mg | ORAL_TABLET | Freq: Two times a day (BID) | ORAL | 5 refills | Status: DC | PRN

## 2021-04-06 MED ORDER — GABAPENTIN 300 MG PO CAPS: 300.0000 mg | ORAL_CAPSULE | Freq: Two times a day (BID) | ORAL | 5 refills | Status: DC

## 2021-04-06 NOTE — Patient Instructions (Addendum)
Consider OTC Voltaren gel, apply heating pad to relax muscle then apply gel ?Increase Gabapentin ?Follow up for shoulder and knee injection (right side)Preparing for your procedure (without sedation) ?Instructions: ?Oral Intake: Do not eat or drink anything for at least 3 hours prior to your procedure. ?Transportation: Unless otherwise stated by your physician, you may drive yourself after the procedure. ?Blood Pressure Medicine: Take your blood pressure medicine with a sip of water the morning of the procedure. ?Insulin: Take only ? of your normal insulin dose. ?Preventing infections: Shower with an antibacterial soap the morning of your procedure. ?Build-up your immune system: Take 1000 mg of Vitamin C with every meal (3 times a day) the day prior to your procedure. ?Pregnancy: If you are pregnant, call and cancel the procedure. ?Sickness: If you have a cold, fever, or any active infections, call and cancel the procedure. ?Arrival: You must be in the facility at least 30 minutes prior to your scheduled procedure. ?Children: Do not bring any children with you. ?Dress appropriately: Bring dark clothing that you would not mind if they get stained. ?Valuables: Do not bring any jewelry or valuables. ?Procedure appointments are reserved for interventional treatments only. ?No Prescription Refills. ?No medication changes will be discussed during procedure appointments. ?No disability issues will be discussed.  ?

## 2021-04-06 NOTE — Progress Notes (Signed)
PROVIDER NOTE: Information contained herein reflects review and annotations entered in association with encounter. Interpretation of such information and data should be left to medically-trained personnel. Information provided to patient can be located elsewhere in the medical record under "Patient Instructions". Document created using STT-dictation technology, any transcriptional errors that may result from process are unintentional.  ?  ?Patient: Kaylee Warner  Service Category: E/M  Provider: Gillis Santa, MD  ?DOB: 1945-05-15  DOS: 04/06/2021  Specialty: Interventional Pain Management  ?MRN: [id number]  Setting: Ambulatory outpatient  PCP: Kaylee Hughs, NP  ?Type: Established Patient    Referring Provider: Sandrea Hughs, NP  ?Location: Office  Delivery: Face-to-face    ? ?HPI  ?Kaylee Warner, a 76 y.o. 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 Kaylee Warner primary complain today is Shoulder Pain (right) and Knee Pain (right) ?Last encounter: My last encounter with her was on 03/18/2021. ?Pertinent problems: Kaylee Warner has Rotator cuff syndrome of right shoulder; Chronic pain syndrome; Depression; Arthropathy of right shoulder; Spinal stenosis, lumbar region, with neurogenic claudication; Lumbar facet arthropathy; Lumbar degenerative disc disease; and Chronic radicular lumbar pain on their pertinent problem list. ?Pain Assessment: Severity of Chronic pain is reported as a 9 /10. Location:   Right/raditae from shoulder up to neck and behind heads. Onset: More than a month ago. Quality: Throbbing. Timing: Intermittent. Modifying factor(s): medications. ?Vitals:  height is '5\' 9"'  (1.753 m) and weight is 180 lb (81.6 kg). Her temperature is 97.4 ?F (36.3 ?C) (abnormal). Her blood pressure is 167/98 (abnormal) and her pulse is 85. Her respiration is 16 and oxygen saturation is 95%.  ? ?Reason for encounter: evaluation of worsening, or previously known  (established) problem.  ? ?Kaylee Warner presents today for worsening right shoulder pain related to shoulder osteoarthritis and rotator cuff dysfunction as well as right knee pain related to right knee osteoarthritis status post right knee arthroscopic surgery.  Patient is status post right knee steroid injection and right shoulder steroid injection on 09/02/2020.  She states that these interventions provided her with approximately 75 to 80% pain relief for over 6 months and now she is having gradual return of her pain.  She describes soreness in her shoulder and pain with weightbearing in her right knee.  She would like to repeat right shoulder and right knee intra-articular steroid injection.  I have offered her extended release triamcinolone for her knee since that is now on formulary.  In the interim, I also recommend she increase her gabapentin to 300 mg twice a day.  I will also refill her gabapentin as below. ? ? ?ROS  ?Constitutional: Denies any fever or chills ?Gastrointestinal: No reported hemesis, hematochezia, vomiting, or acute GI distress ?Musculoskeletal:  Right shoulder, right knee pain ?Neurological: No reported episodes of acute onset apraxia, aphasia, dysarthria, agnosia, amnesia, paralysis, loss of coordination, or loss of consciousness ? ?Medication Review  ?PARoxetine, Vitamin D3, Zinc Sulfate, amLODipine, aspirin EC, atorvastatin, cetirizine, donepezil, fluticasone, gabapentin, lisinopril, meloxicam, and tiZANidine ? ?History Review  ?Allergy: Kaylee Warner is allergic to celecoxib, codeine, crestor [rosuvastatin calcium], fexofenadine, and penicillins. ?Drug: Kaylee Warner  reports no history of drug use. ?Alcohol:  reports current alcohol use of about 7.0 standard drinks per week. ?Tobacco:  reports that she has never smoked. She has never used smokeless tobacco. ?Social: Kaylee Warner  reports that she has never smoked. She has never used smokeless tobacco. She reports current alcohol use of  about 7.0 standard  drinks per week. She reports that she does not use drugs. ?Medical:  has a past medical history of Allergy, Anemia, Arthritis, and Hypertension. ?Surgical: Kaylee Warner  has a past surgical history that includes SARK 2009 and Colonoscopy. ?Family: family history includes Arthritis in an other family member; Diabetes in her sister, son, son, and another family member; Hypertension in her son; Sjogren's syndrome in her daughter. ? ?Laboratory Chemistry Profile  ? ?Renal ?Lab Results  ?Component Value Date  ? BUN 10 01/13/2021  ? CREATININE 0.74 01/13/2021  ? BCR NOT APPLICABLE 93/73/4287  ? GFRAA 106 05/08/2019  ? GFRNONAA 92 05/08/2019  ?  Hepatic ?Lab Results  ?Component Value Date  ? AST 15 01/13/2021  ? ALT 10 01/13/2021  ? ALBUMIN 4.0 12/16/2015  ? ALKPHOS 53 12/16/2015  ?  ?Electrolytes ?Lab Results  ?Component Value Date  ? NA 142 01/13/2021  ? K 4.1 01/13/2021  ? CL 106 01/13/2021  ? CALCIUM 9.5 01/13/2021  ?  Bone ?No results found for: [place], [id number], [id number], [id number], 25OHVITD1, 25OHVITD2, 25OHVITD3, TESTOFREE, TESTOSTERONE  ?Inflammation (CRP: Acute Phase) (ESR: Chronic Phase) ?No results found for: CRP, ESRSEDRATE, LATICACIDVEN    ?  ? ?Note: Above Lab results reviewed. ? ?Physical Exam  ?General appearance: Well nourished, well developed, and well hydrated. In no apparent acute distress ?Mental status: Alert, oriented x 3 (person, place, & time)       ?Respiratory: No evidence of acute respiratory distress ?Eyes: PERLA ?Vitals: BP (!) 167/98   Pulse 85   Temp (!) 97.4 ?F (36.3 ?C)   Resp 16   Ht '5\' 9"'  (1.753 m)   Wt 180 lb (81.6 kg)   SpO2 95%   BMI 26.58 kg/m?  ?BMI: Estimated body mass index is 26.58 kg/m? as calculated from the following: ?  Height as of this encounter: '5\' 9"'  (1.753 m). ?  Weight as of this encounter: 180 lb (81.6 kg). ?Ideal: Ideal body weight: 66.2 kg (145 lb 15.1 oz) ?Adjusted ideal body weight: 72.4 kg (159 lb 9.1 oz) ? ?Cervical Spine Exam  ?Skin & Axial Inspection:  No masses, redness, edema, swelling, or associated skin lesions ?Alignment: Symmetrical ?Functional ROM: Unrestricted ROM      ?Stability: No instability detected ?Muscle Tone/Strength: Functionally intact. No obvious neuro-muscular anomalies detected. ?Sensory (Neurological):  Musculoskeletal ?Palpation: No palpable anomalies             ?  ?           ?Upper Extremity (UE) Exam      ?Side: Right upper extremity   Side: Left upper extremity    ?Skin & Extremity Inspection: Skin color, temperature, and hair growth are WNL. No peripheral edema or cyanosis. No masses, redness, swelling, asymmetry, or associated skin lesions. No contractures.   Skin & Extremity Inspection: Skin color, temperature, and hair growth are WNL. No peripheral edema or cyanosis. No masses, redness, swelling, asymmetry, or associated skin lesions. No contractures.    ?Functional ROM: Diminished ROM for shoulder   Functional ROM: Unrestricted ROM            ?Muscle Tone/Strength: Movement possible against some resistance (4/5) right shoulder ?    Muscle Tone/Strength: Functionally intact. No obvious neuro-muscular anomalies detected.    ?Sensory (Neurological): Arthropathic arthralgia           Sensory (Neurological): Unimpaired            ?Palpation: No palpable anomalies  Palpation: No palpable anomalies                ?Provocative Test(s):  ?Phalen's test: deferred ?Tinel's test: deferred ?Apley's scratch test (touch opposite shoulder):  ?Action 1 (Across chest): Decreased ROM ?Action 2 (Overhead): Decreased ROM ?Action 3 (LB reach): Decreased ROM ?    Provocative Test(s):  ?Phalen's test: deferred ?Tinel's test: deferred ?Apley's scratch test (touch opposite shoulder):  ?Action 1 (Across chest): deferred ?Action 2 (Overhead): deferred ?Action 3 (LB reach): deferred ?     ?  ?Thoracic Spine Area Exam  ?Skin & Axial Inspection: No masses, redness, or swelling ?Alignment: Symmetrical ?Functional ROM: Unrestricted ROM ?Stability: No  instability detected ?Muscle Tone/Strength: Functionally intact. No obvious neuro-muscular anomalies detected. ?Sensory (Neurological): Unimpaired ?Muscle strength & Tone: No palpable anomalies ?  ?Lumbar Exam  ?Sk

## 2021-04-06 NOTE — Progress Notes (Signed)
Safety precautions to be maintained throughout the outpatient stay will include: orient to surroundings, keep bed in low position, maintain call bell within reach at all times, provide assistance with transfer out of bed and ambulation.  

## 2021-04-12 ENCOUNTER — Ambulatory Visit (HOSPITAL_BASED_OUTPATIENT_CLINIC_OR_DEPARTMENT_OTHER): Payer: Medicare Other | Admitting: Student in an Organized Health Care Education/Training Program

## 2021-04-12 ENCOUNTER — Ambulatory Visit
Admission: RE | Admit: 2021-04-12 | Discharge: 2021-04-12 | Disposition: A | Discharge status: Home / Self Care | Payer: Medicare Other | Source: Ambulatory Visit | Attending: Student in an Organized Health Care Education/Training Program | Admitting: Student in an Organized Health Care Education/Training Program

## 2021-04-12 ENCOUNTER — Encounter: Payer: Self-pay | Admitting: Student in an Organized Health Care Education/Training Program

## 2021-04-12 VITALS — BP 167/93 | HR 80 | Temp 97.1°F | Resp 16 | Ht 69.0 in | Wt 180.0 lb

## 2021-04-12 DIAGNOSIS — M19011 Primary osteoarthritis, right shoulder: Secondary | ICD-10-CM | POA: Insufficient documentation

## 2021-04-12 DIAGNOSIS — M12811 Other specific arthropathies, not elsewhere classified, right shoulder: Secondary | ICD-10-CM

## 2021-04-12 DIAGNOSIS — M75101 Unspecified rotator cuff tear or rupture of right shoulder, not specified as traumatic: Secondary | ICD-10-CM

## 2021-04-12 DIAGNOSIS — M1711 Unilateral primary osteoarthritis, right knee: Secondary | ICD-10-CM

## 2021-04-12 DIAGNOSIS — G894 Chronic pain syndrome: Secondary | ICD-10-CM | POA: Insufficient documentation

## 2021-04-12 MED ORDER — TRIAMCINOLONE ACETONIDE 32 MG IX SRER
32.0000 mg | Freq: Once | INTRA_ARTICULAR | Status: AC
  Administered 2021-04-12: 32 mg via INTRA_ARTICULAR
  Filled 2021-04-12: qty 5

## 2021-04-12 MED ORDER — ROPIVACAINE HCL 2 MG/ML IJ SOLN
INTRAMUSCULAR | Status: AC
  Filled 2021-04-12: qty 20

## 2021-04-12 MED ORDER — METHYLPREDNISOLONE ACETATE 40 MG/ML IJ SUSP
40.0000 mg | Freq: Once | INTRAMUSCULAR | Status: AC
  Administered 2021-04-12: 40 mg via INTRA_ARTICULAR
  Filled 2021-04-12: qty 1

## 2021-04-12 MED ORDER — IOHEXOL 180 MG/ML  SOLN
10.0000 mL | Freq: Once | INTRAMUSCULAR | Status: AC
  Administered 2021-04-12: 10 mL via INTRA_ARTICULAR
  Filled 2021-04-12: qty 20

## 2021-04-12 MED ORDER — LIDOCAINE HCL 2 % IJ SOLN
20.0000 mL | Freq: Once | INTRAMUSCULAR | Status: AC
  Administered 2021-04-12: 400 mg
  Filled 2021-04-12: qty 20

## 2021-04-12 MED ORDER — ROPIVACAINE HCL 2 MG/ML IJ SOLN
4.0000 mL | Freq: Once | INTRAMUSCULAR | Status: AC
  Administered 2021-04-12: 4 mL via INTRA_ARTICULAR

## 2021-04-12 NOTE — Patient Instructions (Signed)

## 2021-04-12 NOTE — Progress Notes (Signed)
PROVIDER NOTE: Interpretation of information contained herein should be left to medically-trained personnel. Specific patient instructions are provided elsewhere under "Patient Instructions" section of medical record. This document was created in part using STT-dictation technology, any transcriptional errors that may result from this process are unintentional.  ?Patient: Kaylee Warner ?Type: Established ?DOB: April 26, 1945 ?MRN: [id number] ?PCP: Ngetich, Nelda Bucks, NP  Service: Procedure ?DOS: 04/12/2021 ?Setting: Ambulatory ?Location: Ambulatory outpatient facility ?Delivery: Face-to-face Provider: Gillis Santa, MD ?Specialty: Interventional Pain Management ?Specialty designation: 09 ?Location: Outpatient facility ?Ref. Prov.: Ngetich, Nelda Bucks, NP   ? ?Primary Reason for Visit: Interventional Pain Management Treatment. ?CC: Shoulder Pain (right) and Knee Pain (right) ?  ?Procedure:          ? Type: Steroid Intra-articular Knee Injection (ER Triamcinolone) ?Laterality: Right (-RT) ?Level/approach: Medial ?Imaging guidance: None required ([id number]) ?Anesthesia: Local anesthesia (1-2% Lidocaine) ?Anxiolysis: None                 ?Sedation: None. ? ?Purpose: Diagnostic/Therapeutic ?Indications: Knee arthralgia associated to osteoarthritis of the knee ?1. Primary osteoarthritis of right knee   ?2. Right rotator cuff tear arthropathy   ?3. Arthropathy of right shoulder   ?4. Chronic pain syndrome   ? ?NAS-11 score:  ? Pre-procedure: 8 /10  ? Post-procedure: 8  (shoulder = 8, knee = 1)/10  ?  ? ?Pre-Procedure Preparation  ?Monitoring: As per clinic protocol.  ?Risk Assessment: ?Vitals:  PNT:[id number] body mass index is 26.58 kg/m? as calculated from the following: ?  Height as of this encounter: '5\' 9"'$  (1.753 m). ?  Weight as of this encounter: 180 lb (81.6 kg)., Rate:80ECG Heart Rate: 73, BP:(!) 157/92, Resp:18, Temp:(!) 97.1 ?F (36.2 ?C), SpO2:97 %  ?Allergies: She is allergic to celecoxib, codeine, crestor [rosuvastatin  calcium], fexofenadine, and penicillins.  ?Precautions: No additional precautions required  ?Blood-thinner(s): None at this time  ?Coagulopathies: Reviewed. None identified.   ?Active Infection(s): Reviewed. None identified. Kaylee Warner is afebrile  ? ?Location setting: Exam room ?Position: Sitting w/ knee bent 90 degrees ?Safety Precautions: Patient was assessed for positional comfort and pressure points before starting the procedure. ?Prepping solution: DuraPrep (Iodine Povacrylex [0.7% available iodine] and Isopropyl Alcohol, 74% w/w) ?Prep Area: Entire knee region ?Approach: percutaneous, just above the tibial plateau, medial to the infrapatellar tendon. ?Intended target: Intra-articular knee space ?Materials: ?Tray: Block ?Needle(s): Regular ?Qty: 1/side ?Length: 1.5-inch ?Gauge: 25G ? ?5 cc solution of triamcinolone 32 mg was injected into the right knee. ?  ? ?Meds ordered this encounter  ?Medications  ? iohexol (OMNIPAQUE) 180 MG/ML injection 10 mL  ?  Must be Myelogram-compatible. If not available, you may substitute with a water-soluble, non-ionic, hypoallergenic, myelogram-compatible radiological contrast medium.  ? lidocaine (XYLOCAINE) 2 % (with pres) injection 400 mg  ? ropivacaine (PF) 2 mg/mL (0.2%) (NAROPIN) injection 4 mL  ? methylPREDNISolone acetate (DEPO-MEDROL) injection 40 mg  ? Triamcinolone Acetonide SRER 32 mg  ?  Maintain refrigerated.  Prepared suspension may be stored up to 4 hours at ambient conditions.  ?  Orders Placed This Encounter  ?Procedures  ? [facility]-ARM 1-60 MIN NO REPORT  ?  Intraoperative interpretation by procedural physician at [place].  ?  Standing Status:   Standing  ?  Number of Occurrences:   1  ?  Order Specific Question:   Reason for exam:  ?  Answer:   Assistance in needle guidance and placement for procedures requiring needle placement in or near specific anatomical  locations not easily accessible without such assistance.  ?  ? ?Time-out: 1400  I initiated and conducted the "Time-out" before starting the procedure, as per protocol. The patient was asked to participate by confirming the accuracy of the "Time Out" information. Verification of the correct person, site, and procedure were performed and confirmed by me, the nursing staff, and the patient. "Time-out" conducted as per Joint Commission's Universal Protocol (UP.01.01.01). ?Procedure checklist: Completed  ? ?H&P (Pre-op  Assessment)  ?Kaylee Warner is a 76 y.o. (year old), female patient, seen today for interventional treatment. She  has a past surgical history that includes [place] 2009 and Colonoscopy. Kaylee Warner has a current medication list which includes the following prescription(s): amlodipine, aspirin ec, atorvastatin, cetirizine, vitamin d3, donepezil, fluticasone, gabapentin, lisinopril, meloxicam, paroxetine, tizanidine, and zinc sulfate. Her primarily concern today is the Shoulder Pain (right) and Knee Pain (right) ? She is allergic to celecoxib, codeine, crestor [rosuvastatin calcium], fexofenadine, and penicillins.  ? ?Last encounter: My last encounter with her was on 04/06/2021. ?Pertinent problems: Kaylee Warner has Rotator cuff syndrome of right shoulder; Chronic pain syndrome; Depression; Arthropathy of right shoulder; Spinal stenosis, lumbar region, with neurogenic claudication; Lumbar facet arthropathy; Lumbar degenerative disc disease; and Chronic radicular lumbar pain on their pertinent problem list. ?Pain Assessment: Severity of Chronic pain is reported as a 8 /10. Location: Shoulder Right/denies. Onset: More than a month ago. Quality: Throbbing. Timing: Constant. Modifying factor(s): Gabapentin, Tylenol, Turmeric. ?Vitals:  height is '5\' 9"'$  (1.753 m) and weight is 180 lb (81.6 kg). Her temporal temperature is 97.1 ?F (36.2 ?C) (abnormal). Her blood pressure is 167/93 (abnormal) and her pulse is 80. Her respiration is 16 and oxygen saturation is 99%.  ? ?Reason for encounter:  "interventional pain management therapy due pain of at least four (4) weeks in duration, with failure to respond and/or inability to tolerate more conservative care.  ? ?Site Confirmation: Ms. Distel was asked to confirm the procedure and laterality before marking the site. ? ?Consent: Before the procedure and under the influence of no sedative(s), amnesic(s), or anxiolytics, the patient was informed of the treatment options, risks and possible complications. To fulfill our ethical and legal obligations, as recommended by the American Medical Association's Code of Ethics, I have informed the patient of my clinical impression; the nature and purpose of the treatment or procedure; the risks, benefits, and possible complications of the intervention; the alternatives, including doing nothing; the risk(s) and benefit(s) of the alternative treatment(s) or procedure(s); and the risk(s) and benefit(s) of doing nothing. ?The patient was provided information about the general risks and possible complications associated with the procedure. These may include, but are not limited to: failure to achieve desired goals, infection, bleeding, organ or nerve damage, allergic reactions, paralysis, and death. ?In addition, the patient was informed of those risks and complications associated to Spine-related procedures, such as failure to decrease pain; infection (i.e.: Meningitis, epidural or intraspinal abscess); bleeding (i.e.: epidural hematoma, subarachnoid hemorrhage, or any other type of intraspinal or peri-dural bleeding); organ or nerve damage (i.e.: Any type of peripheral nerve, nerve root, or spinal cord injury) with subsequent damage to sensory, motor, and/or autonomic systems, resulting in permanent pain, numbness, and/or weakness of one or several areas of the body; allergic reactions; (i.e.: anaphylactic reaction); and/or death. ?Furthermore, the patient was informed of those risks and complications associated with the  medications. These include, but are not limited to: allergic reactions (i.e.: anaphylactic or anaphylactoid reaction(s)); adrenal axis suppression; blood sugar  elevation that in diabetics may result in ketoacidosis or com

## 2021-04-12 NOTE — Progress Notes (Signed)
PROVIDER NOTE: Information contained herein reflects review and annotations entered in association with encounter. Interpretation of such information and data should be left to medically-trained personnel. Information provided to patient can be located elsewhere in the medical record under "Patient Instructions". Document created using STT-dictation technology, any transcriptional errors that may result from process are unintentional.  ?  ?Patient: Kaylee Warner  Service Category: Procedure  Provider: Gillis Santa, MD  ?DOB: 10/26/1945  DOS: 04/12/2021  Location: [facility]  ?MRN: [id number]  Setting: Ambulatory - outpatient  Referring Provider: Sandrea Hughs, NP  ?Type: Established Patient  Specialty: Interventional Pain Management  PCP: Ngetich, Nelda Bucks, NP  ? ?Primary Reason for Visit: Interventional Pain Management Treatment. ?CC: Shoulder Pain (right) and Knee Pain (right) ? ?Procedure:          Anesthesia, Analgesia, Anxiolysis:  ?Type: Therapeutic Glenohumeral Joint (shoulder) Injection #1 (in 2023)  ?Primary Purpose: Therapeutic ?Region: Anterolateral Shoulder Area ?Level:  Shoulder ?Target Area: Glenohumeral Joint (shoulder) ?Approach: Anterolateral approach. ?Laterality: Right-Sided  Type: Local Anesthesia ? ?Local Anesthetic: Lidocaine 1-2% ? ?Position: Supine  ? ?Indications: ?Right shoulder arthropathy ? ? ?Pain Score: ?Pre-procedure: 8 /10 ?Post-procedure: 8  (shoulder = 8, knee = 1)/10  ? ?Pre-op Assessment:  ?Kaylee Warner is a 76 y.o. (year old), female patient, seen today for interventional treatment. She  has a past surgical history that includes [place] 2009 and Colonoscopy. Kaylee Warner has a current medication list which includes the following prescription(s): amlodipine, aspirin ec, atorvastatin, cetirizine, vitamin d3, donepezil, fluticasone, gabapentin, lisinopril, meloxicam, paroxetine, tizanidine, and zinc sulfate. Her primarily concern today is the Shoulder Pain (right) and Knee  Pain (right) ? ?Initial Vital Signs:  ?Pulse/HCG Rate: 80ECG Heart Rate: 73 ?Temp: (!) 97.1 ?F (36.2 ?C) ?Resp: 18 ?BP: (!) 157/92 ?SpO2: 97 % ? ?BMI: Estimated body mass index is 26.58 kg/m? as calculated from the following: ?  Height as of this encounter: '5\' 9"'$  (1.753 m). ?  Weight as of this encounter: 180 lb (81.6 kg). ? ?Risk Assessment: ?Allergies: Reviewed. She is allergic to celecoxib, codeine, crestor [rosuvastatin calcium], fexofenadine, and penicillins.  ?Allergy Precautions: None required ?Coagulopathies: Reviewed. None identified.  ?Blood-thinner therapy: None at this time ?Active Infection(s): Reviewed. None identified. Kaylee Warner is afebrile ? ?Site Confirmation: Kaylee Warner was asked to confirm the procedure and laterality before marking the site ?Procedure checklist: Completed ?Consent: Before the procedure and under the influence of no sedative(s), amnesic(s), or anxiolytics, the patient was informed of the treatment options, risks and possible complications. To fulfill our ethical and legal obligations, as recommended by the American Medical Association's Code of Ethics, I have informed the patient of my clinical impression; the nature and purpose of the treatment or procedure; the risks, benefits, and possible complications of the intervention; the alternatives, including doing nothing; the risk(s) and benefit(s) of the alternative treatment(s) or procedure(s); and the risk(s) and benefit(s) of doing nothing. ?The patient was provided information about the general risks and possible complications associated with the procedure. These may include, but are not limited to: failure to achieve desired goals, infection, bleeding, organ or nerve damage, allergic reactions, paralysis, and death. ?In addition, the patient was informed of those risks and complications associated to the procedure, such as failure to decrease pain; infection; bleeding; organ or nerve damage with subsequent damage to sensory,  motor, and/or autonomic systems, resulting in permanent pain, numbness, and/or weakness of one or several areas of the body; allergic reactions; (i.e.: anaphylactic reaction);  and/or death. ?Furthermore, the patient was informed of those risks and complications associated with the medications. These include, but are not limited to: allergic reactions (i.e.: anaphylactic or anaphylactoid reaction(s)); adrenal axis suppression; blood sugar elevation that in diabetics may result in ketoacidosis or comma; water retention that in patients with history of congestive heart failure may result in shortness of breath, pulmonary edema, and decompensation with resultant heart failure; weight gain; swelling or edema; medication-induced neural toxicity; particulate matter embolism and blood vessel occlusion with resultant organ, and/or nervous system infarction; and/or aseptic necrosis of one or more joints. ?Finally, the patient was informed that Medicine is not an exact science; therefore, there is also the possibility of unforeseen or unpredictable risks and/or possible complications that may result in a catastrophic outcome. The patient indicated having understood very clearly. We have given the patient no guarantees and we have made no promises. Enough time was given to the patient to ask questions, all of which were answered to the patient's satisfaction. Kaylee Warner has indicated that she wanted to continue with the procedure. ?Attestation: I, the ordering provider, attest that I have discussed with the patient the benefits, risks, side-effects, alternatives, likelihood of achieving goals, and potential problems during recovery for the procedure that I have provided informed consent. ?Date  Time: 04/12/2021  1:08 PM ? ?Pre-Procedure Preparation:  ?Monitoring: As per clinic protocol. Respiration, ETCO2, SpO2, BP, heart rate and rhythm monitor placed and checked for adequate function ?Safety Precautions: Patient was assessed  for positional comfort and pressure points before starting the procedure. ?Time-out: I initiated and conducted the "Time-out" before starting the procedure, as per protocol. The patient was asked to participate by confirming the accuracy of the "Time Out" information. Verification of the correct person, site, and procedure were performed and confirmed by me, the nursing staff, and the patient. "Time-out" conducted as per Joint Commission's Universal Protocol (UP.01.01.01). ?Time: 1400 ? ?Description of Procedure:          ?Area Prepped: Entire shoulder Area ?DuraPrep (Iodine Povacrylex [0.7% available iodine] and Isopropyl Alcohol, 74% w/w) ?Safety Precautions: Aspiration looking for blood return was conducted prior to all injections. At no point did we inject any substances, as a needle was being advanced. No attempts were made at seeking any paresthesias. Safe injection practices and needle disposal techniques used. Medications properly checked for expiration dates. SDV (single dose vial) medications used. ?Description of the Procedure: Protocol guidelines were followed. The patient was placed in position over the procedure table. The target area was identified and the area prepped in the usual manner. Skin & deeper tissues infiltrated with local anesthetic. Appropriate amount of time allowed to pass for local anesthetics to take effect. The procedure needles were then advanced to the target area. Proper needle placement secured. Negative aspiration confirmed. Solution injected in intermittent fashion, asking for systemic symptoms every 0.5cc of injectate. The needles were then removed and the area cleansed, making sure to leave some of the prepping solution back to take advantage of its long term bactericidal properties. ? ?      ? ?Vitals:  ? 04/12/21 1311 04/12/21 1352 04/12/21 1357 04/12/21 1407  ?BP: (!) 157/92 (!) 160/99 (!) 162/97 (!) 167/93  ?Pulse: 80     ?Resp: '18 15 15 16  '$ ?Temp: (!) 97.1 ?F (36.2 ?C)      ?TempSrc: Temporal     ?SpO2: 97% 99% 96% 99%  ?Weight: 180 lb (81.6 kg)     ?Height: '5\' 9"'$  (1.753 m)     ?  ?  Start Time: 1400 hrs. ?End Time: 1405 hrs. ?Materials:  ?Needle(s) Type: Spinal Needle ?Gaug

## 2021-04-13 ENCOUNTER — Telehealth: Payer: Self-pay

## 2021-04-13 NOTE — Telephone Encounter (Signed)
Post procedure phone call.  Patient states she is doing well.  

## 2021-04-19 ENCOUNTER — Ambulatory Visit (INDEPENDENT_AMBULATORY_CARE_PROVIDER_SITE_OTHER): Payer: Medicare Other | Admitting: Family

## 2021-04-19 ENCOUNTER — Encounter: Payer: Self-pay | Admitting: Family

## 2021-04-19 VITALS — BP 138/80 | HR 72 | Temp 97.6°F | Resp 16 | Ht 69.0 in | Wt 187.4 lb

## 2021-04-19 DIAGNOSIS — I10 Essential (primary) hypertension: Secondary | ICD-10-CM

## 2021-04-19 DIAGNOSIS — G8929 Other chronic pain: Secondary | ICD-10-CM | POA: Diagnosis not present

## 2021-04-19 DIAGNOSIS — M25511 Pain in right shoulder: Secondary | ICD-10-CM

## 2021-04-19 MED ORDER — AMLODIPINE BESYLATE 10 MG PO TABS: 10.0000 mg | ORAL_TABLET | Freq: Every day | ORAL | 0 refills | Status: DC

## 2021-04-19 NOTE — Patient Instructions (Addendum)
Increase Amlodipine from 5 mg tablet to 10 mg tablet one by mouth daily. ?DASH Eating Plan ?DASH stands for Dietary Approaches to Stop Hypertension. The DASH eating plan is a healthy eating plan that has been shown to: ?Reduce high blood pressure (hypertension). ?Reduce your risk for type 2 diabetes, heart disease, and stroke. ?Help with weight loss. ?What are tips for following this plan? ?Reading food labels ?Check food labels for the amount of salt (sodium) per serving. Choose foods with less than 5 percent of the Daily Value of sodium. Generally, foods with less than 300 milligrams (mg) of sodium per serving fit into this eating plan. ?To find whole grains, look for the word "whole" as the first word in the ingredient list. ?Shopping ?Buy products labeled as "low-sodium" or "no salt added." ?Buy fresh foods. Avoid canned foods and pre-made or frozen meals. ?Cooking ?Avoid adding salt when cooking. Use salt-free seasonings or herbs instead of table salt or sea salt. Check with your health care provider or pharmacist before using salt substitutes. ?Do not fry foods. Cook foods using healthy methods such as baking, boiling, grilling, roasting, and broiling instead. ?Cook with heart-healthy oils, such as olive, canola, avocado, soybean, or sunflower oil. ?Meal planning ? ?Eat a balanced diet that includes: ?4 or more servings of fruits and 4 or more servings of vegetables each day. Try to fill one-half of your plate with fruits and vegetables. ?6-8 servings of whole grains each day. ?Less than 6 oz (170 g) of lean meat, poultry, or fish each day. A 3-oz (85-g) serving of meat is about the same size as a deck of cards. One egg equals 1 oz (28 g). ?2-3 servings of low-fat dairy each day. One serving is 1 cup (237 mL). ?1 serving of nuts, seeds, or beans 5 times each week. ?2-3 servings of heart-healthy fats. Healthy fats called omega-3 fatty acids are found in foods such as walnuts, flaxseeds, fortified milks, and  eggs. These fats are also found in cold-water fish, such as sardines, salmon, and mackerel. ?Limit how much you eat of: ?Canned or prepackaged foods. ?Food that is high in trans fat, such as some fried foods. ?Food that is high in saturated fat, such as fatty meat. ?Desserts and other sweets, sugary drinks, and other foods with added sugar. ?Full-fat dairy products. ?Do not salt foods before eating. ?Do not eat more than 4 egg yolks a week. ?Try to eat at least 2 vegetarian meals a week. ?Eat more home-cooked food and less restaurant, buffet, and fast food. ?Lifestyle ?When eating at a restaurant, ask that your food be prepared with less salt or no salt, if possible. ?If you drink alcohol: ?Limit how much you use to: ?0-1 drink a day for women who are not pregnant. ?0-2 drinks a day for men. ?Be aware of how much alcohol is in your drink. In the [place] one drink equals one 12 oz bottle of beer (355 mL), one 5 oz glass of wine (148 mL), or one 1? oz glass of hard liquor (44 mL). ?General information ?Avoid eating more than 2,300 mg of salt a day. If you have hypertension, you may need to reduce your sodium intake to 1,500 mg a day. ?Work with your health care provider to maintain a healthy body weight or to lose weight. Ask what an ideal weight is for you. ?Get at least 30 minutes of exercise that causes your heart to beat faster (aerobic exercise) most days of the week. Activities may  include walking, swimming, or biking. ?Work with your health care provider or dietitian to adjust your eating plan to your individual calorie needs. ?What foods should I eat? ?Fruits ?All fresh, dried, or frozen fruit. Canned fruit in natural juice (without added sugar). ?Vegetables ?Fresh or frozen vegetables (raw, steamed, roasted, or grilled). Low-sodium or reduced-sodium tomato and vegetable juice. Low-sodium or reduced-sodium tomato sauce and tomato paste. Low-sodium or reduced-sodium canned vegetables. ?Grains ?Whole-grain or  whole-wheat bread. Whole-grain or whole-wheat pasta. Brown rice. [name]. Bulgur. Whole-grain and low-sodium cereals. Pita bread. Low-fat, low-sodium crackers. Whole-wheat flour tortillas. ?Meats and other proteins ?Skinless chicken or [place]. Ground chicken or [place]. Pork with fat trimmed off. Fish and seafood. Egg whites. Dried beans, peas, or lentils. Unsalted nuts, nut butters, and seeds. Unsalted canned beans. Lean cuts of beef with fat trimmed off. Low-sodium, lean precooked or cured meat, such as sausages or meat loaves. ?Dairy ?Low-fat (1%) or fat-free (skim) milk. Reduced-fat, low-fat, or fat-free cheeses. Nonfat, low-sodium ricotta or cottage cheese. Low-fat or nonfat yogurt. Low-fat, low-sodium cheese. ?Fats and oils ?Soft margarine without trans fats. Vegetable oil. Reduced-fat, low-fat, or light mayonnaise and salad dressings (reduced-sodium). Canola, safflower, olive, avocado, soybean, and sunflower oils. Avocado. ?Seasonings and condiments ?Herbs. Spices. Seasoning mixes without salt. ?Other foods ?Unsalted popcorn and pretzels. Fat-free sweets. ?The items listed above may not be a complete list of foods and beverages you can eat. Contact a dietitian for more information. ?What foods should I avoid? ?Fruits ?Canned fruit in a light or heavy syrup. Fried fruit. Fruit in cream or butter sauce. ?Vegetables ?Creamed or fried vegetables. Vegetables in a cheese sauce. Regular canned vegetables (not low-sodium or reduced-sodium). Regular canned tomato sauce and paste (not low-sodium or reduced-sodium). Regular tomato and vegetable juice (not low-sodium or reduced-sodium). [name]. Olives. ?Grains ?Baked goods made with fat, such as croissants, muffins, or some breads. Dry pasta or rice meal packs. ?Meats and other proteins ?Fatty cuts of meat. Ribs. Fried meat. [name]. Bologna, salami, and other precooked or cured meats, such as sausages or meat loaves. Fat from the back of a pig (fatback).  Bratwurst. Salted nuts and seeds. Canned beans with added salt. Canned or smoked fish. Whole eggs or egg yolks. Chicken or [place] with skin. ?Dairy ?Whole or 2% milk, cream, and half-and-half. Whole or full-fat cream cheese. Whole-fat or sweetened yogurt. Full-fat cheese. Nondairy creamers. Whipped toppings. Processed cheese and cheese spreads. ?Fats and oils ?Butter. Stick margarine. Lard. Shortening. Ghee. Bacon fat. Tropical oils, such as coconut, palm kernel, or palm oil. ?Seasonings and condiments ?Onion salt, garlic salt, seasoned salt, table salt, and sea salt. Worcestershire sauce. Tartar sauce. Barbecue sauce. Teriyaki sauce. Soy sauce, including reduced-sodium. Steak sauce. Canned and packaged gravies. Fish sauce. Oyster sauce. Cocktail sauce. Store-bought horseradish. Ketchup. Mustard. Meat flavorings and tenderizers. Bouillon cubes. Hot sauces. Pre-made or packaged marinades. Pre-made or packaged taco seasonings. Relishes. Regular salad dressings. ?Other foods ?Salted popcorn and pretzels. ?The items listed above may not be a complete list of foods and beverages you should avoid. Contact a dietitian for more information. ?Where to find more information ?National Heart, Lung, and Blood Institute: https://wilson-eaton.com/ ?American Heart Association: www.heart.org ?Academy of Nutrition and Dietetics: www.eatright.org ?Plain: www.kidney.org ?Summary ?The DASH eating plan is a healthy eating plan that has been shown to reduce high blood pressure (hypertension). It may also reduce your risk for type 2 diabetes, heart disease, and stroke. ?When on the DASH eating plan, aim to eat more fresh fruits  and vegetables, whole grains, lean proteins, low-fat dairy, and heart-healthy fats. ?With the DASH eating plan, you should limit salt (sodium) intake to 2,300 mg a day. If you have hypertension, you may need to reduce your sodium intake to 1,500 mg a day. ?Work with your health care provider or  dietitian to adjust your eating plan to your individual calorie needs. ?This information is not intended to replace advice given to you by your health care provider. Make sure you discuss any questions you have with your hea

## 2021-04-19 NOTE — Progress Notes (Signed)
? ?Provider: Marlowe Sax FNP-C ? ?Octavia Velador, Nelda Bucks, NP ? ?Patient Care Team: ?Mabry Tift, Nelda Bucks, NP as PCP - General (Family Medicine) ? ?Extended Emergency Contact Information ?Primary Emergency Contact: Nicotra,Roderick ? [place] ?Mobile Phone: [phone] ?Relation: Son ?Secondary Emergency Contact: Brammer,James R ?Address: [address]         [place], [place] [address] [place] ?Home Phone: [phone] ?Relation: Spouse ? ?Code Status:  Full Code  ?Goals of care: Advanced Directive information ? ?  04/19/2021  ?  3:07 PM  ?Advanced Directives  ?Does Patient Have a Medical Advance Directive? Yes  ?Type of Paramedic of [place];Living will  ?Does patient want to make changes to medical advance directive? No - Patient declined  ?Copy of [place] in Chart? No - copy requested  ? ? ? ?Chief Complaint  ?Patient presents with  ? Acute Visit  ?  Patient complains of high Blood Pressure.   ? ? ?HPI:  ?Pt is a 76 y.o. female seen today for an acute visit for evaluation of high blood pressure.states b/p was high for the past three day.She contacted her a friend in her [place] who is a Designer, jewellery and she advised her to increase her Amlodipine from 5 mg to 10 mg tablet daily.B/p was in the 140's/80's - 160's/100 and had headache.symptoms resolved after increasing medication.B/p today is 138/80. ?She denies any headache,dizziness,vision changes,fatigue,chest tightness,palpitation,chest pain or shortness of breath.   ?Recently had cortisol injection to her right shoulder for Rotary cuff tear which has helped with the pain.    ? ? ? ?Past Medical History:  ?Diagnosis Date  ? Allergy   ? Anemia   ? Arthritis   ? Hypertension   ? ?Past Surgical History:  ?Procedure Laterality Date  ? COLONOSCOPY    ? SARK 2009    ? Dr. Jolee Ewing   ? ? ?Allergies  ?Allergen Reactions  ? Celecoxib   ?  REACTION: blood pressure went up  ? Codeine Itching  ? Crestor  [Rosuvastatin Calcium]   ?  Muscle cramping and achy  ? Fexofenadine Other (See Comments)  ?  unknown  ? Penicillins Rash  ? ? ?Outpatient Encounter Medications as of 04/19/2021  ?Medication Sig  ? aspirin EC 81 MG tablet Take 1 tablet (81 mg total) by mouth daily.  ? atorvastatin (LIPITOR) 10 MG tablet Take 1 tablet (10 mg total) by mouth 3 (three) times a week.  ? cetirizine (ZYRTEC) 10 MG tablet Take 1 tablet by mouth once daily  ? Cholecalciferol (VITAMIN D3) 125 MCG (5000 UT) CAPS Take 1 capsule (5,000 Units total) by mouth every morning.  ? donepezil (ARICEPT) 10 MG tablet TAKE 1 TABLET BY MOUTH AT BEDTIME  ? fluticasone (FLONASE) 50 MCG/ACT nasal spray Use 2 spray(s) in each nostril once daily  ? gabapentin (NEURONTIN) 300 MG capsule Take 1 capsule (300 mg total) by mouth 2 (two) times daily.  ? lisinopril (ZESTRIL) 40 MG tablet Take 1 tablet (40 mg total) by mouth daily.  ? meloxicam (MOBIC) 15 MG tablet Take 1 tablet (15 mg total) by mouth daily.  ? PARoxetine (PAXIL) 30 MG tablet Take 1 tablet by mouth once daily  ? tiZANidine (ZANAFLEX) 4 MG tablet Take 1 tablet (4 mg total) by mouth 2 (two) times daily as needed.  ? Zinc Sulfate 220 (50 Zn) MG TABS Take 1 tablet (220 mg total) by mouth daily.  ? [DISCONTINUED] amLODipine (  NORVASC) 10 MG tablet Take 1/2 (one-half) tablet by mouth once daily  ? amLODipine (NORVASC) 10 MG tablet Take 1 tablet (10 mg total) by mouth daily.  ? ?No facility-administered encounter medications on file as of 04/19/2021.  ? ? ?Review of Systems  ?Constitutional:  Negative for appetite change, chills, fatigue, fever and unexpected weight change.  ?Eyes:  Negative for pain, discharge, redness, itching and visual disturbance.  ?Respiratory:  Negative for cough, chest tightness, shortness of breath and wheezing.   ?Cardiovascular:  Negative for chest pain, palpitations and leg swelling.  ?Gastrointestinal:  Negative for abdominal distention, abdominal pain, blood in stool, constipation,  diarrhea, nausea and vomiting.  ?Genitourinary:  Negative for difficulty urinating, dysuria, flank pain, frequency and urgency.  ?Musculoskeletal:  Positive for arthralgias. Negative for back pain, gait problem, joint swelling, myalgias, neck pain and neck stiffness.  ?     Right shoulder pain   ?Skin:  Negative for color change, pallor, rash and wound.  ?Neurological:  Negative for dizziness, syncope, speech difficulty, weakness, light-headedness, numbness and headaches.  ?Hematological:  Does not bruise/bleed easily.  ?Psychiatric/Behavioral:  Negative for agitation, behavioral problems, confusion, hallucinations, self-injury, sleep disturbance and suicidal ideas. The patient is not nervous/anxious.   ? ?Immunization History  ?Administered Date(s) Administered  ? Fluad Quad(high Dose 65+) 10/29/2018, 10/14/2020  ? Influenza, High Dose Seasonal PF 11/24/2017  ? Influenza,inj,Quad PF,6+ Mos 09/24/2015, 12/15/2016  ? Moderna Sars-Covid-2 Vaccination 03/29/2019, 04/26/2019, 11/23/2019, 12/17/2019  ? Pneumococcal Conjugate-13 12/16/2015  ? Pneumococcal Polysaccharide-23 06/27/2018  ? Td 01/17/2017  ? Tdap 11/23/2010  ? ?Pertinent  Health Maintenance Due  ?Topic Date Due  ? INFLUENZA VACCINE  08/10/2021  ? COLONOSCOPY (Pts 45-20yr Insurance coverage will need to be confirmed)  10/17/2021  ? DEXA SCAN  Completed  ? ? ?  01/13/2021  ?  9:02 AM 02/12/2021  ?  3:48 PM 04/06/2021  ? 11:39 AM 04/12/2021  ?  1:14 PM 04/19/2021  ?  3:06 PM  ?Fall Risk  ?Falls in the past year? 0 0 0 1 1  ?Was there an injury with Fall? 0 0  0 0  ?Fall Risk Category Calculator 0 0  1 1  ?Fall Risk Category Low Low  Low Low  ?Patient Fall Risk Level Low fall risk Low fall risk   Low fall risk  ?Patient at Risk for Falls Due to No Fall Risks No Fall Risks   History of fall(s)  ?Fall risk Follow up Falls evaluation completed Falls evaluation completed   Falls evaluation completed  ? ?Functional Status Survey: ?  ? ?Vitals:  ? 04/19/21 1536  ?BP: 138/80   ?Pulse: 72  ?Resp: 16  ?Temp: 97.6 ?F (36.4 ?C)  ?SpO2: 93%  ?Weight: 187 lb 6.4 oz (85 kg)  ?Height: '5\' 9"'$  (1.753 m)  ? ?Body mass index is 27.67 kg/m?.Marland Kitchen?Physical Exam ?Vitals reviewed.  ?Constitutional:   ?   General: She is not in acute distress. ?   Appearance: Normal appearance. She is normal weight. She is not ill-appearing or diaphoretic.  ?HENT:  ?   Head: Normocephalic.  ?Eyes:  ?   General: No scleral icterus.    ?   Right eye: No discharge.     ?   Left eye: No discharge.  ?   Extraocular Movements: Extraocular movements intact.  ?   Conjunctiva/sclera: Conjunctivae normal.  ?   Pupils: Pupils are equal, round, and reactive to light.  ?Neck:  ?   Vascular: No  carotid bruit.  ?Cardiovascular:  ?   Rate and Rhythm: Normal rate and regular rhythm.  ?   Pulses: Normal pulses.  ?   Heart sounds: Normal heart sounds. No murmur heard. ?  No friction rub. No gallop.  ?Pulmonary:  ?   Effort: Pulmonary effort is normal. No respiratory distress.  ?   Breath sounds: Normal breath sounds. No wheezing, rhonchi or rales.  ?Chest:  ?   Chest wall: No tenderness.  ?Abdominal:  ?   General: Bowel sounds are normal. There is no distension.  ?   Palpations: Abdomen is soft. There is no mass.  ?   Tenderness: There is no abdominal tenderness. There is no right CVA tenderness, left CVA tenderness, guarding or rebound.  ?Musculoskeletal:     ?   General: No swelling. Normal range of motion.  ?   Right shoulder: Tenderness present. No swelling, effusion or crepitus. Normal strength. Normal pulse.  ?   Cervical back: Normal range of motion. No rigidity or tenderness.  ?   Right lower leg: No edema.  ?   Left lower leg: No edema.  ?   Comments: Guarding right shoulder with range of motion  ?Lymphadenopathy:  ?   Cervical: No cervical adenopathy.  ?Skin: ?   General: Skin is warm and dry.  ?   Coloration: Skin is not pale.  ?   Findings: No bruising, erythema, lesion or rash.  ?Neurological:  ?   Mental Status: She is alert and  oriented to person, place, and time.  ?   Cranial Nerves: No cranial nerve deficit.  ?   Sensory: No sensory deficit.  ?   Motor: No weakness.  ?   Coordination: Coordination normal.  ?   Gait: Gait normal.

## 2021-04-30 IMAGING — MG DIGITAL SCREENING BILAT W/ TOMO W/ CAD
8 series · 8 of 24 positions shown · non-contrast
Comparison: Previous exam(s).

CLINICAL DATA: Screening.

EXAM:
DIGITAL SCREENING BILATERAL MAMMOGRAM WITH TOMO AND CAD

[L CC synth-2D]
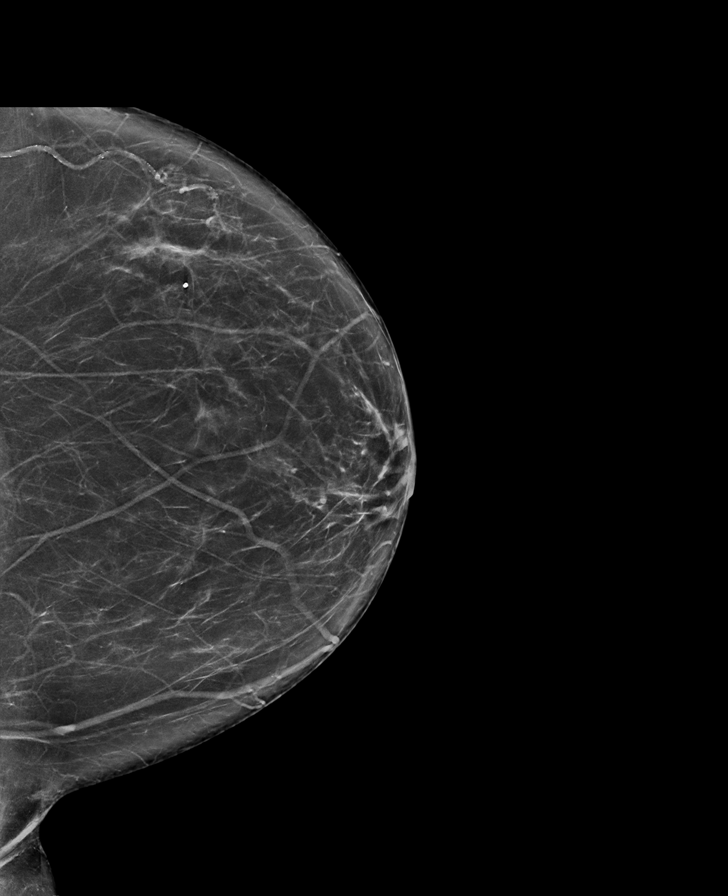

[R CC synth-2D]
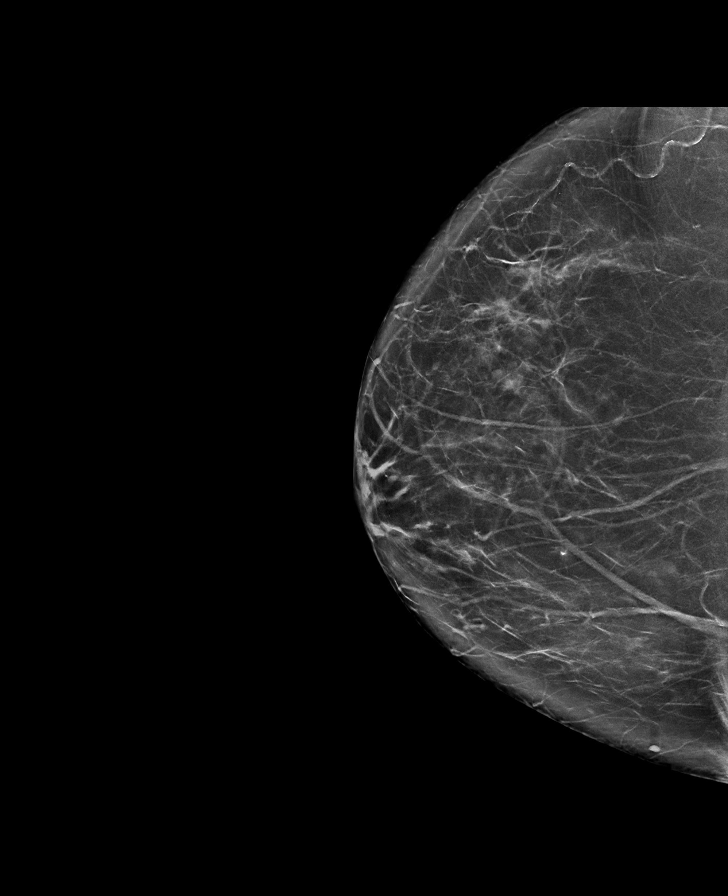

[R MLO synth-2D]
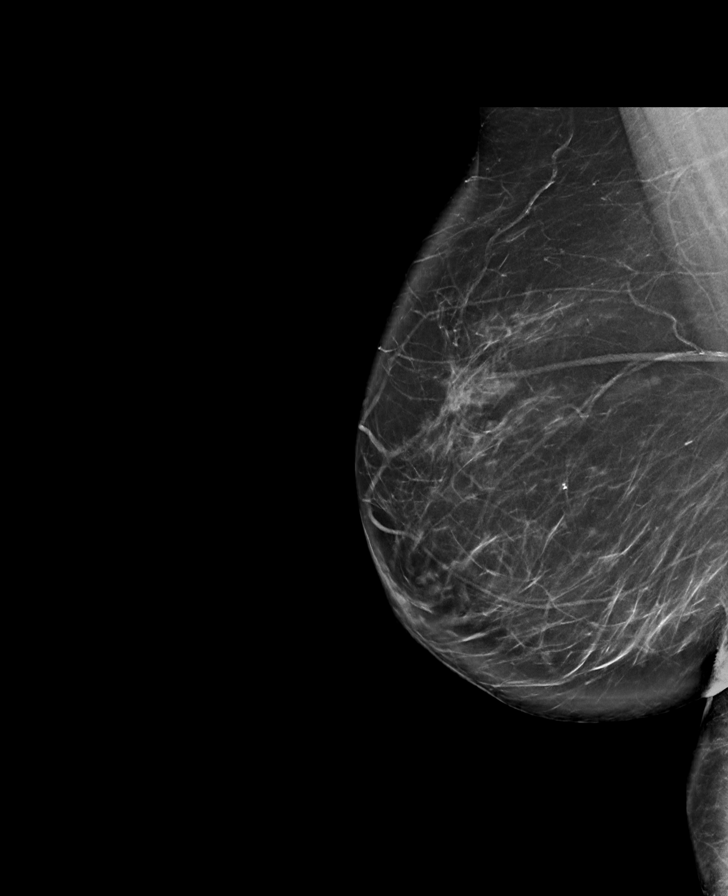

[L MLO synth-2D]
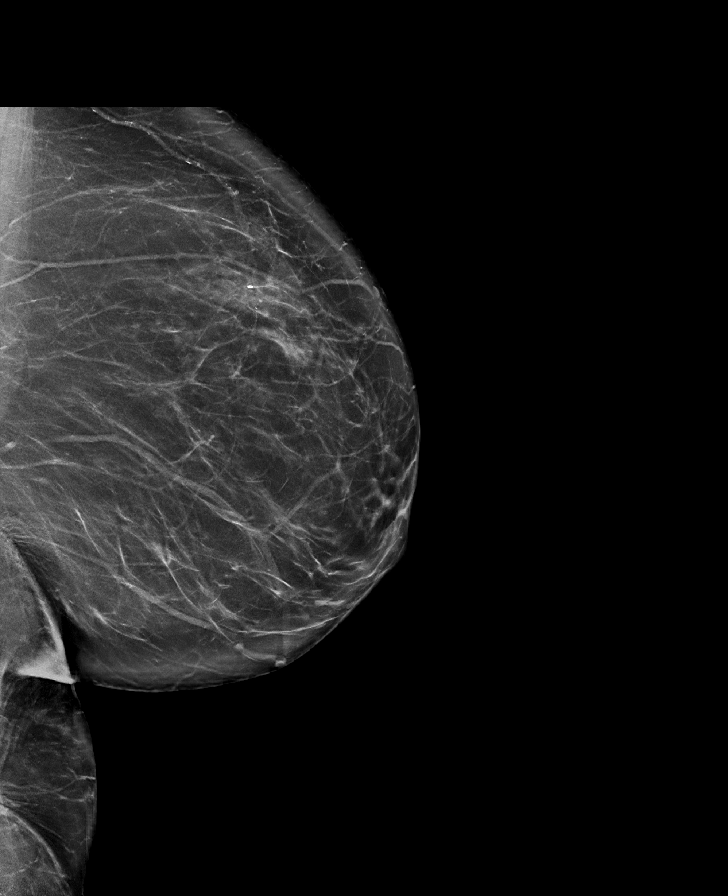

[L CC tomo · tomo slice 39/77.0]
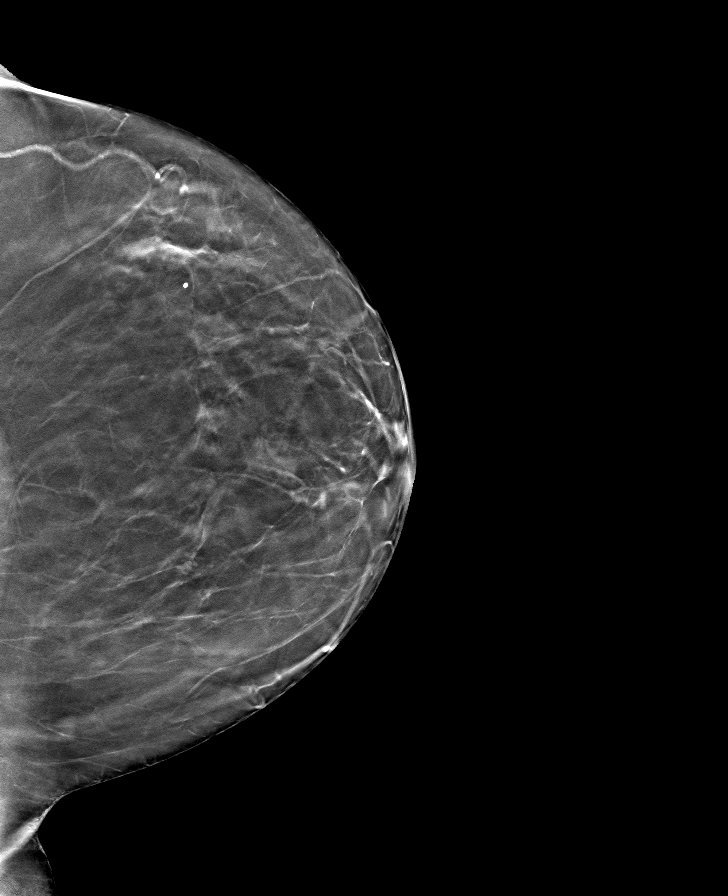

[R MLO tomo · tomo slice 44/87.0]
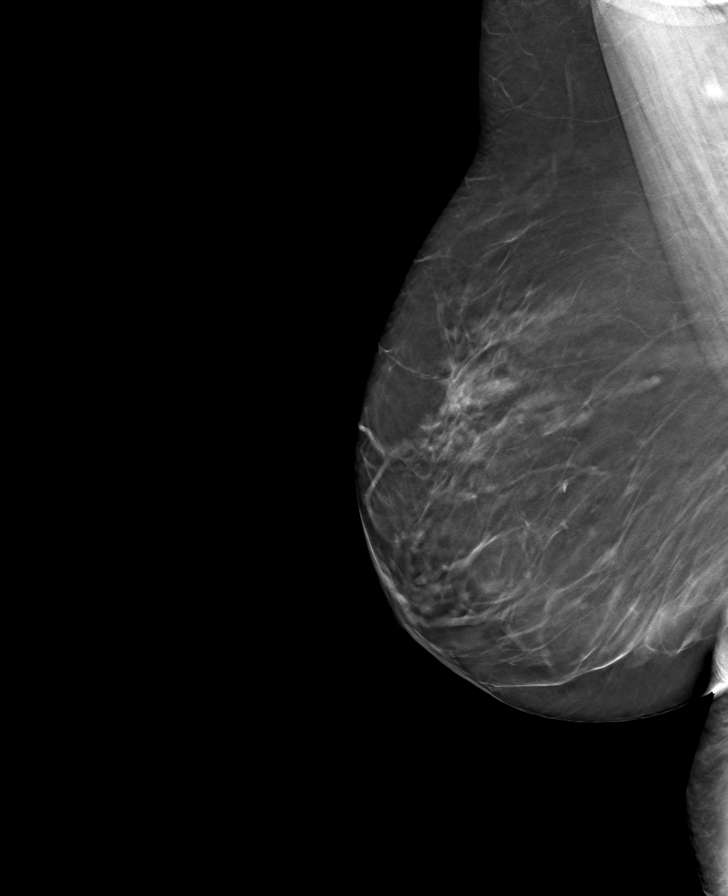

[L MLO tomo · tomo slice 43/85.0]
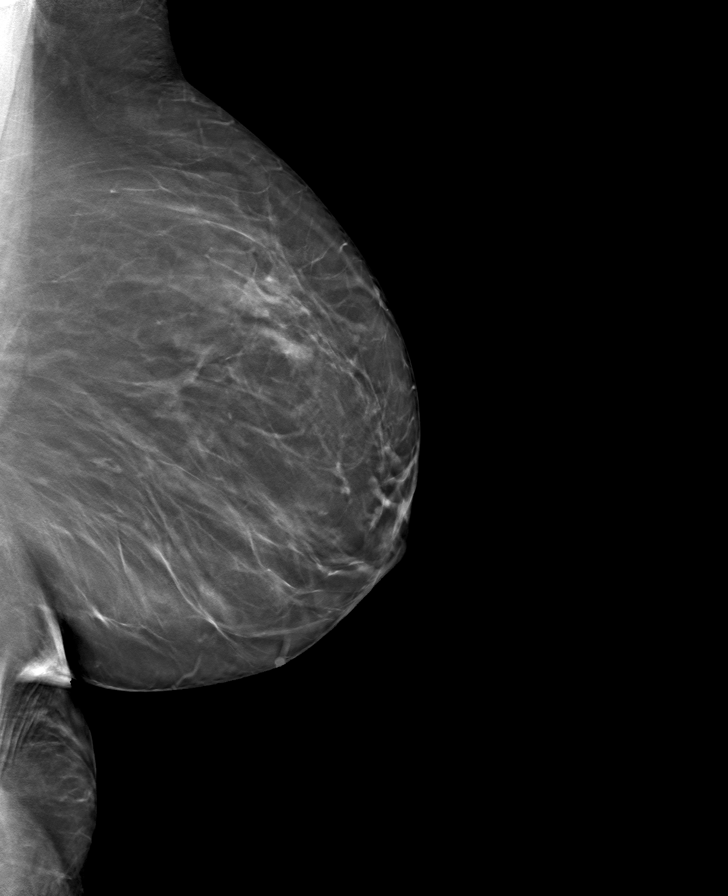

[R CC tomo · tomo slice 41/82.0]
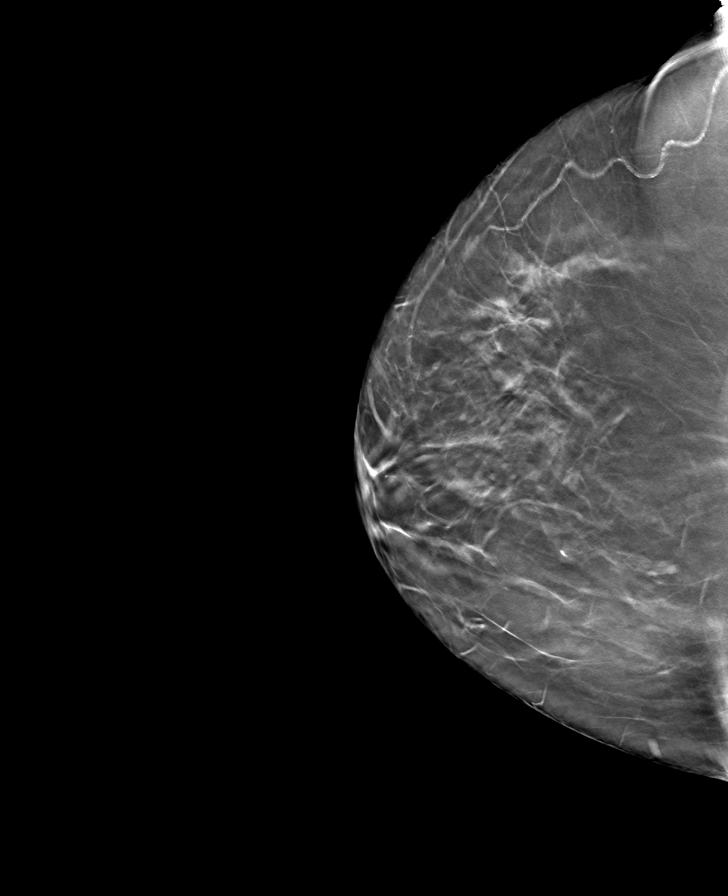

[8 of 24 positions shown; findings below may reference images not displayed]

ACR Breast Density Category b: There are scattered areas of
fibroglandular density.
FINDINGS: There are no findings suspicious for malignancy. Images were
processed with CAD.
IMPRESSION: No mammographic evidence of malignancy. A result letter of this
screening mammogram will be mailed directly to the patient.

RECOMMENDATION:
Screening mammogram in one year. (Code:[id number])

BI-RADS CATEGORY  1: Negative.

## 2021-05-04 ENCOUNTER — Ambulatory Visit
Payer: Medicare Other | Attending: Student in an Organized Health Care Education/Training Program | Admitting: Student in an Organized Health Care Education/Training Program

## 2021-05-04 DIAGNOSIS — G894 Chronic pain syndrome: Secondary | ICD-10-CM

## 2021-05-04 DIAGNOSIS — M75101 Unspecified rotator cuff tear or rupture of right shoulder, not specified as traumatic: Secondary | ICD-10-CM

## 2021-05-04 DIAGNOSIS — M12811 Other specific arthropathies, not elsewhere classified, right shoulder: Secondary | ICD-10-CM

## 2021-05-04 DIAGNOSIS — M19011 Primary osteoarthritis, right shoulder: Secondary | ICD-10-CM

## 2021-05-04 DIAGNOSIS — M1711 Unilateral primary osteoarthritis, right knee: Secondary | ICD-10-CM

## 2021-05-04 NOTE — Progress Notes (Signed)
I attempted to call the patient however no response. Voicemail left instructing patient to call front desk office at [phone] to reschedule appointment. -Dr Orrin Yurkovich  

## 2021-05-08 ENCOUNTER — Other Ambulatory Visit: Payer: Self-pay | Admitting: Family

## 2021-05-08 DIAGNOSIS — I10 Essential (primary) hypertension: Secondary | ICD-10-CM

## 2021-05-17 ENCOUNTER — Encounter: Payer: Self-pay | Admitting: Student in an Organized Health Care Education/Training Program

## 2021-05-18 ENCOUNTER — Encounter: Payer: Self-pay | Admitting: Student in an Organized Health Care Education/Training Program

## 2021-05-18 ENCOUNTER — Ambulatory Visit
Payer: Medicare Other | Attending: Student in an Organized Health Care Education/Training Program | Admitting: Student in an Organized Health Care Education/Training Program

## 2021-05-18 DIAGNOSIS — G894 Chronic pain syndrome: Secondary | ICD-10-CM

## 2021-05-18 NOTE — Progress Notes (Signed)
I attempted to call the patient however no response. Voicemail left instructing patient to call front desk office at [phone] to reschedule appointment. ?-Dr Holley Raring ? ? ? ?Post-procedure evaluation  ? Type: Steroid Intra-articular Knee Injection (ER Triamcinolone) ?Laterality: Right (-RT) ?Level/approach: Medial ?Imaging guidance: None required (CPT-20610) ?Anesthesia: Local anesthesia (1-2% Lidocaine) ?Anxiolysis: None                 ?Sedation: None. ? ?Purpose: Diagnostic/Therapeutic ?Indications: Knee arthralgia associated to osteoarthritis of the knee ?1. Primary osteoarthritis of right knee   ?2. Right rotator cuff tear arthropathy   ?3. Arthropathy of right shoulder   ?4. Chronic pain syndrome   ? ?Type: Therapeutic Glenohumeral Joint (shoulder) Injection #1 (in 2023)  ?Primary Purpose: Therapeutic ?Region: Anterolateral Shoulder Area ?Level:  Shoulder ?Target Area: Glenohumeral Joint (shoulder) ?Approach: Anterolateral approach. ?Laterality: Right-Sided ? ?NAS-11 score:  ? Pre-procedure: 8 /10  ? Post-procedure: 8  (shoulder = 8, knee = 1)/10  ?   ?Effectiveness:  ?Initial hour after procedure: 100 %  ?Subsequent 4-6 hours post-procedure: 100 %  ?Analgesia past initial 6 hours: 70 %  ? ?

## 2021-05-26 ENCOUNTER — Encounter: Payer: Self-pay | Admitting: Family

## 2021-05-26 ENCOUNTER — Telehealth (INDEPENDENT_AMBULATORY_CARE_PROVIDER_SITE_OTHER): Payer: Medicare Other | Admitting: Family

## 2021-05-26 ENCOUNTER — Telehealth: Payer: Self-pay

## 2021-05-26 VITALS — BP 144/85 | HR 73 | Ht 69.0 in | Wt 180.0 lb

## 2021-05-26 DIAGNOSIS — F321 Major depressive disorder, single episode, moderate: Secondary | ICD-10-CM

## 2021-05-26 DIAGNOSIS — G2581 Restless legs syndrome: Secondary | ICD-10-CM

## 2021-05-26 MED ORDER — PAROXETINE HCL 20 MG PO TABS: ORAL_TABLET | ORAL | 0 refills | Status: DC

## 2021-05-26 MED ORDER — SERTRALINE HCL 25 MG PO TABS: ORAL_TABLET | ORAL | 3 refills | Status: DC

## 2021-05-26 NOTE — Patient Instructions (Signed)
-   PARoxetine (PAXIL) 20 MG tablet; Take 1 tablet (20 mg total) by mouth daily for 14 days, THEN 0.5 tablets (10 mg total) daily for 14 days, THEN 0.5 tablets (10 mg total) every other day for 7 days then stop.  Dispense: 35 tablet; Refill: 0 ? ?- sertraline (ZOLOFT) 25 MG tablet; Start after stopping Paroxetine.Take 25 mg by mouth daily x 1 week then increase to 50 mg tablet one by mouth daily  Dispense: 30 tablet; Refill: 3 ?

## 2021-05-26 NOTE — Telephone Encounter (Signed)
This service is provided via telemedicine ? ?vital signs collected/recorded  ? ?Location of patient (ex: home, work):  home ? ?Patient consents to a telephone visit:  yes ? ?Location of the provider (ex: office, home):  [place] ? ?Name of any referring provider:  Ngetich, Nelda Bucks, NP ? ? ?Names of all persons participating in the telemedicine service and their role in the encounter:  patient, [name], Ngetich, Nelda Bucks, NP ? ? ?Time spent on call:  10 ? ?[name],you are scheduled for a virtual visit with your provider today.   ? ?Just as we do with appointments in the office, we must obtain your consent to participate.  Your consent will be active for this visit and any virtual visit you may have with one of our providers in the next 365 days.   ? ?If you have a MyChart account, I can also send a copy of this consent to you electronically.  All virtual visits are billed to your insurance company just like a traditional visit in the office.  As this is a virtual visit, video technology does not allow for your provider to perform a traditional examination.  This may limit your provider's ability to fully assess your condition.  If your provider identifies any concerns that need to be evaluated in person or the need to arrange testing such as labs, EKG, etc, we will make arrangements to do so.   ? ?Although advances in technology are sophisticated, we cannot ensure that it will always work on either your end or our end.  If the connection with a video visit is poor, we may have to switch to a telephone visit.  With either a video or telephone visit, we are not always able to ensure that we have a secure connection.   I need to obtain your verbal consent now.   Are you willing to proceed with your visit today?  ? ?Kaylee Warner has provided verbal consent on 05/26/2021 for a virtual visit (video or telephone). ? ? ?Debe Coder, CMA ?05/26/2021  8:13 AM ? ? ? ? ?

## 2021-05-26 NOTE — Progress Notes (Signed)
? ?Provider: Marlowe Sax FNP-C ? ?Kerensa Nicklas, Nelda Bucks, NP ? ?Patient Care Team: ?Kailen Hinkle, Nelda Bucks, NP as PCP - General (Family Medicine) ? ?Extended Emergency Contact Information ?Primary Emergency Contact: Tarlton,Roderick ? [place] ?Mobile Phone: [phone] ?Relation: Son ?Secondary Emergency Contact: Rathert,James R ?Address: [address]         [place], [place] [address] [place] ?Home Phone: [phone] ?Relation: Spouse ? ?Code Status:  Full Code  ?Goals of care: Advanced Directive information ? ?  05/26/2021  ?  8:03 AM  ?Advanced Directives  ?Does Patient Have a Medical Advance Directive? Yes  ?Type of Paramedic of [place];Living will  ?Copy of [place] in Chart? No - copy requested  ?Would patient like information on creating a medical advance directive? No - Patient declined  ? ? ? ?Chief Complaint  ?Patient presents with  ? Acute Visit  ?  Patient is here to discuss medications for depression been having mood swings for over a month  and restless leg syndrome ?  ? ? ?HPI:  ?Pt is a 76 y.o. female seen today for an acute visit to discuss depression.Has been having mood swings x 1 month.staying depressed all the time.took care of the Husband after he had two hip replacement and forgot all about herself.Husband spend most time taking care of his church members but not her. ? ?Does a lot of spot cleaning in the house does not get time for herself. Had thoughts of ending her life by driving away last week but this has improved.Has a friend from church who is a Designer, jewellery that helps her a lot.Takes her out to eat and pays for her meals. Otherwise,has difficulties talking to other church friends since they are his husband's members.  ?She feels bad about her body since she has gained weight and feels like she is aging too fast.  ? ?She would like to go for a cruise but husband and her children are not interested.Her NP friend  might go with her.  ? ?She denies any suicide thoughts or injury to self or others.I've discuss with her to call suicide number on her previous AVS that she had during visit whenever she has any thoughts of injury to self or others. ? ?Also complains of restless leg syndrome. She bought some magnesium citrate and takes mustard at night which seemed to have help. ? ?Of note,she was schedule for video visit but stated did not want the provider to see how bad she looked. Switched to telephone visit.  ? ? ?Past Medical History:  ?Diagnosis Date  ? Allergy   ? Anemia   ? Arthritis   ? Hypertension   ? ?Past Surgical History:  ?Procedure Laterality Date  ? COLONOSCOPY    ? SARK 2009    ? Dr. Jolee Ewing   ? ? ?Allergies  ?Allergen Reactions  ? Celecoxib   ?  REACTION: blood pressure went up  ? Codeine Itching  ? Crestor [Rosuvastatin Calcium]   ?  Muscle cramping and achy  ? Fexofenadine Other (See Comments)  ?  unknown  ? Penicillins Rash  ? ? ?Outpatient Encounter Medications as of 05/26/2021  ?Medication Sig  ? amLODipine (NORVASC) 10 MG tablet Take 1 tablet (10 mg total) by mouth daily.  ? aspirin EC 81 MG tablet Take 1 tablet (81 mg total) by mouth daily.  ? atorvastatin (LIPITOR) 10 MG tablet Take 1 tablet (10 mg total) by  mouth 3 (three) times a week.  ? cetirizine (ZYRTEC) 10 MG tablet Take 1 tablet by mouth once daily  ? Cholecalciferol (VITAMIN D3) 125 MCG (5000 UT) CAPS Take 1 capsule (5,000 Units total) by mouth every morning.  ? donepezil (ARICEPT) 10 MG tablet TAKE 1 TABLET BY MOUTH AT BEDTIME  ? fluticasone (FLONASE) 50 MCG/ACT nasal spray Use 2 spray(s) in each nostril once daily  ? gabapentin (NEURONTIN) 300 MG capsule Take 1 capsule (300 mg total) by mouth 2 (two) times daily.  ? lisinopril (ZESTRIL) 40 MG tablet Take 1 tablet by mouth once daily  ? meloxicam (MOBIC) 15 MG tablet Take 1 tablet (15 mg total) by mouth daily.  ? PARoxetine (PAXIL) 30 MG tablet Take 1 tablet by mouth once daily  ? tiZANidine  (ZANAFLEX) 4 MG tablet Take 1 tablet (4 mg total) by mouth 2 (two) times daily as needed.  ? Zinc Sulfate 220 (50 Zn) MG TABS Take 1 tablet (220 mg total) by mouth daily.  ? ?No facility-administered encounter medications on file as of 05/26/2021.  ? ? ?Review of Systems  ?Constitutional:  Negative for appetite change, chills, fatigue and fever.  ?Respiratory:  Negative for cough, chest tightness, shortness of breath and wheezing.   ?Cardiovascular:  Negative for chest pain, palpitations and leg swelling.  ?Gastrointestinal:  Negative for abdominal distention, abdominal pain, constipation, diarrhea, nausea and vomiting.  ?Skin:  Negative for color change and pallor.  ?Psychiatric/Behavioral:  Negative for agitation, behavioral problems, decreased concentration, dysphoric mood, hallucinations and sleep disturbance. The patient is not nervous/anxious.   ?     Increased depression symptoms.Had suicide thoughts one week ago but none since then.   ? ?Immunization History  ?Administered Date(s) Administered  ? Fluad Quad(high Dose 65+) 10/29/2018, 10/14/2020  ? Influenza, High Dose Seasonal PF 11/24/2017  ? Influenza,inj,Quad PF,6+ Mos 09/24/2015, 12/15/2016  ? Moderna Sars-Covid-2 Vaccination 03/29/2019, 04/26/2019, 11/23/2019, 12/17/2019  ? Pneumococcal Conjugate-13 12/16/2015  ? Pneumococcal Polysaccharide-23 06/27/2018  ? Td 01/17/2017  ? Tdap 11/23/2010  ? ?Pertinent  Health Maintenance Due  ?Topic Date Due  ? INFLUENZA VACCINE  08/10/2021  ? COLONOSCOPY (Pts 45-55yr Insurance coverage will need to be confirmed)  10/17/2021  ? DEXA SCAN  Completed  ? ? ?  02/12/2021  ?  3:48 PM 04/06/2021  ? 11:39 AM 04/12/2021  ?  1:14 PM 04/19/2021  ?  3:06 PM 05/26/2021  ?  8:16 AM  ?Fall Risk  ?Falls in the past year? 0 0 1 1 0  ?Was there an injury with Fall? 0  0 0 0  ?Fall Risk Category Calculator 0  1 1 0  ?Fall Risk Category Low  Low Low Low  ?Patient Fall Risk Level Low fall risk   Low fall risk Low fall risk  ?Patient at Risk for  Falls Due to No Fall Risks   History of fall(s) No Fall Risks  ?Fall risk Follow up Falls evaluation completed   Falls evaluation completed Falls evaluation completed  ? ?Functional Status Survey: ?  ? ?Vitals:  ? 05/26/21 0806  ?BP: (!) 144/85  ?Pulse: 73  ?Weight: 180 lb (81.6 kg)  ?Height: '5\' 9"'$  (1.753 m)  ? ?Body mass index is 26.58 kg/m?. ? ?Physical Exam ?Unable to complete on Telephone visit. ? ?Labs reviewed: ?Recent Labs  ?  10/14/20 ?1544 01/13/21 ?0936  ?NA 140 142  ?K 4.3 4.1  ?CL 107 106  ?CO2 22 26  ?GLUCOSE 92 106*  ?BUN 15  10  ?CREATININE 0.79 0.74  ?CALCIUM 9.7 9.5  ? ?Recent Labs  ?  10/14/20 ?1544 01/13/21 ?0936  ?AST 16 15  ?ALT 10 10  ?BILITOT 0.7 0.6  ?PROT 6.8 6.7  ? ?Recent Labs  ?  01/13/21 ?0936  ?WBC 5.5  ?NEUTROABS 3,333  ?HGB 12.6  ?HCT 40.5  ?MCV 78.0*  ?PLT 322  ? ?Lab Results  ?Component Value Date  ? TSH 1.86 01/13/2021  ? ?Lab Results  ?Component Value Date  ? HGBA1C 5.9 (H) 01/13/2021  ? ?Lab Results  ?Component Value Date  ? CHOL 232 (H) 01/13/2021  ? HDL 55 01/13/2021  ? LDLCALC 154 (H) 01/13/2021  ? TRIG 116 01/13/2021  ? CHOLHDL 4.2 01/13/2021  ? ? ?Significant Diagnostic Results in last 30 days:  ?No results found. ? ?Assessment/Plan ? ?1. Current moderate episode of major depressive disorder, unspecified whether recurrent ([place]) ?Request paroxetine to be discontinued and switch to sertraline.discussed weaning off paroxetine as below then start on sertraline after completing paroxetine. ?- PARoxetine (PAXIL) 20 MG tablet; Take 1 tablet (20 mg total) by mouth daily for 14 days, THEN 0.5 tablets (10 mg total) daily for 14 days, THEN 0.5 tablets (10 mg total) every other day for 7 days then stop. Dispense: 35 tablet; Refill: 0 ?- sertraline (ZOLOFT) 25 MG tablet; Start after stopping Paroxetine.Take 25 mg by mouth daily x 1 week then increase to 50 mg tablet one by mouth daily  Dispense: 30 tablet; Refill: 3 ? ?2. Restless leg syndrome ?Continue on Gabapentin and magnesium  supplement. ? ?Family/ staff Communication: Reviewed plan of care with patient verbalized understanding  ? ?Labs/tests ordered: None  ? ?I connected with  Josseline C Hukill on 05/26/21 by a Telephone enabled telemedi

## 2021-05-26 NOTE — Telephone Encounter (Signed)
This service is provided via telemedicine ? ?No vital signs collected/recorded due to the encounter was a telemedicine visit.  ? ?Location of patient (ex: home, work):  home ? ?Patient consents to a telephone visit:  yes ? ?Location of the provider (ex: office, home):  [facility] ? ?Name of any referring provider:  N/A ? ?Names of all persons participating in the telemedicine service and their role in the encounter:  patient, [name], [place], Nelda Bucks, NP ? ? ?Time spent on call:  10 minutes ? ?

## 2021-06-29 DIAGNOSIS — M2011 Hallux valgus (acquired), right foot: Secondary | ICD-10-CM | POA: Diagnosis not present

## 2021-06-29 DIAGNOSIS — M205X1 Other deformities of toe(s) (acquired), right foot: Secondary | ICD-10-CM | POA: Diagnosis not present

## 2021-06-29 DIAGNOSIS — M2041 Other hammer toe(s) (acquired), right foot: Secondary | ICD-10-CM | POA: Diagnosis not present

## 2021-06-29 DIAGNOSIS — M79671 Pain in right foot: Secondary | ICD-10-CM | POA: Diagnosis not present

## 2021-06-29 DIAGNOSIS — M19071 Primary osteoarthritis, right ankle and foot: Secondary | ICD-10-CM | POA: Diagnosis not present

## 2021-07-14 ENCOUNTER — Other Ambulatory Visit: Payer: Self-pay | Admitting: Family

## 2021-07-14 DIAGNOSIS — I10 Essential (primary) hypertension: Secondary | ICD-10-CM

## 2021-07-16 ENCOUNTER — Encounter: Payer: Medicare Other | Admitting: Family

## 2021-07-19 ENCOUNTER — Encounter: Payer: Self-pay | Admitting: Family

## 2021-07-19 ENCOUNTER — Ambulatory Visit (INDEPENDENT_AMBULATORY_CARE_PROVIDER_SITE_OTHER): Payer: Medicare Other | Admitting: Family

## 2021-07-19 VITALS — BP 120/74 | HR 72 | Temp 97.5°F | Resp 18 | Ht 69.0 in | Wt 183.0 lb

## 2021-07-19 DIAGNOSIS — I10 Essential (primary) hypertension: Secondary | ICD-10-CM

## 2021-07-19 DIAGNOSIS — R7303 Prediabetes: Secondary | ICD-10-CM | POA: Diagnosis not present

## 2021-07-19 DIAGNOSIS — M25511 Pain in right shoulder: Secondary | ICD-10-CM

## 2021-07-19 DIAGNOSIS — F321 Major depressive disorder, single episode, moderate: Secondary | ICD-10-CM

## 2021-07-19 DIAGNOSIS — Z23 Encounter for immunization: Secondary | ICD-10-CM | POA: Diagnosis not present

## 2021-07-19 DIAGNOSIS — G8929 Other chronic pain: Secondary | ICD-10-CM

## 2021-07-19 DIAGNOSIS — E782 Mixed hyperlipidemia: Secondary | ICD-10-CM

## 2021-07-19 MED ORDER — SERTRALINE HCL 25 MG PO TABS: 50.0000 mg | ORAL_TABLET | Freq: Every day | ORAL | 1 refills | Status: DC

## 2021-07-19 NOTE — Assessment & Plan Note (Signed)
Blood pressure well controlled -Continue on amlodipine and lisinopril,

## 2021-07-19 NOTE — Assessment & Plan Note (Signed)
Previous LDL not at goal 154 Statin intolerance -Continue with dietary modification and exercise

## 2021-07-19 NOTE — Assessment & Plan Note (Signed)
Lab Results  Component Value Date   HGBA1C 5.9 (H) 01/13/2021  -Dietary modification and exercise advised

## 2021-07-19 NOTE — Assessment & Plan Note (Signed)
Second dose of pneumococcal 23 vaccine administered today by CMA no reaction reported. -Advised to take over-the-counter Tylenol as needed for pain if she develops any soreness or achiness or running any fever or chills.

## 2021-07-19 NOTE — Patient Instructions (Signed)
Please get your shingles and COVID-19 booster 5th at your pharmacy

## 2021-07-19 NOTE — Progress Notes (Signed)
  This encounter was created in error - please disregard. No show 

## 2021-07-19 NOTE — Assessment & Plan Note (Signed)
Limited range of motion.  Has upcoming appointment with orthopedic in the morning for possible cortisone injection. -Continue to monitor

## 2021-07-19 NOTE — Assessment & Plan Note (Signed)
Mood stable -Continue on sertraline -Plans to go for cruise with friends in August.

## 2021-07-19 NOTE — Progress Notes (Signed)
Provider: Marlowe Sax FNP-C   Charlette Hennings, Nelda Bucks, NP  Patient Care Team: Kevyn Wengert, Nelda Bucks, NP as PCP - General (Family Medicine)  Extended Emergency Contact Information Primary Emergency Contact: [place] Mobile Phone: [phone] Relation: Son Secondary Emergency Contact: Barg,James R Address: [address]          [place], [place] [address] [place] House Phone: [phone] Relation: Spouse  Code Status:  Full Code  Goals of care: Advanced Directive information    07/19/2021   10:37 AM  Advanced Directives  Does Patient Have a Medical Advance Directive? Yes  Type of Paramedic of [place];Living will  Does patient want to make changes to medical advance directive? No - Patient declined  Copy of [place] in Chart? No - copy requested  Would patient like information on creating a medical advance directive? No - Patient declined     Chief Complaint  Patient presents with   Medical Management of Chronic Issues    Patient is here for a 60M F/U and labs    HPI:  Pt is a 76 y.o. female seen today for 6 months for follow up for medical management of chronic diseases.  Has some medical history of essential hypertension, prediabetes, hyperlipidemia, chronic right shoulder pain following up with pain management, current moderate depression among others  Off Gabapentin states was not working.Has appointment with pain management tomorrow.   Depression - stable.Husband spends time most time with his congregation than her which upsets her.she will be going for a cruise with her friends but wish the husband could go along. Zoloft has helped with her depression.   Health Maintenance:   Due for shingles,COVID-19 booster 5 th and PNA 23 vaccine   Past Medical History:  Diagnosis Date   Allergy    Anemia    Arthritis    Hypertension    Past Surgical History:  Procedure Laterality Date    COLONOSCOPY     SARK 2009     Dr. Jolee Ewing     Allergies  Allergen Reactions   Celecoxib     REACTION: blood pressure went up   Codeine Itching   Crestor [Rosuvastatin Calcium]     Muscle cramping and achy   Fexofenadine Other (See Comments)    unknown   Penicillins Rash    Allergies as of 07/19/2021       Reactions   Celecoxib    REACTION: blood pressure went up   Codeine Itching   Crestor [rosuvastatin Calcium]    Muscle cramping and achy   Fexofenadine Other (See Comments)   unknown   Penicillins Rash        Medication List        Accurate as of July 19, 2021 10:45 AM. If you have any questions, ask your nurse or doctor.          STOP taking these medications    gabapentin 300 MG capsule Commonly known as: NEURONTIN Stopped by: Sandrea Hughs, NP       TAKE these medications    amLODipine 10 MG tablet Commonly known as: NORVASC Take 1 tablet by mouth once daily   aspirin EC 81 MG tablet Take 1 tablet (81 mg total) by mouth daily.   atorvastatin 10 MG tablet Commonly known as: LIPITOR Take 1 tablet (10 mg total) by mouth 3 (three) times a week.   cetirizine 10 MG tablet Commonly known as: ZYRTEC  Take 1 tablet by mouth once daily   donepezil 10 MG tablet Commonly known as: ARICEPT TAKE 1 TABLET BY MOUTH AT BEDTIME   fluticasone 50 MCG/ACT nasal spray Commonly known as: FLONASE Use 2 spray(s) in each nostril once daily   lisinopril 40 MG tablet Commonly known as: ZESTRIL Take 1 tablet by mouth once daily   meloxicam 15 MG tablet Commonly known as: MOBIC Take 1 tablet (15 mg total) by mouth daily.   PARoxetine 20 MG tablet Commonly known as: PAXIL Take 1 tablet (20 mg total) by mouth daily for 14 days, THEN 0.5 tablets (10 mg total) daily for 14 days, THEN 0.5 tablets (10 mg total) every other day for 7 days. Start taking on: May 26, 2021   sertraline 25 MG tablet Commonly known as: ZOLOFT Start after stopping Paroxetine.Take 25  mg by mouth daily x 1 week then increase to 50 mg tablet one by mouth daily   tiZANidine 4 MG tablet Commonly known as: ZANAFLEX Take 1 tablet (4 mg total) by mouth 2 (two) times daily as needed.   Vitamin D3 125 MCG (5000 UT) Caps Take 1 capsule (5,000 Units total) by mouth every morning.   Zinc Sulfate 220 (50 Zn) MG Tabs Take 1 tablet (220 mg total) by mouth daily.        Review of Systems  Constitutional:  Negative for appetite change, chills, fatigue, fever and unexpected weight change.  HENT:  Negative for congestion, dental problem, ear discharge, ear pain, facial swelling, hearing loss, nosebleeds, postnasal drip, rhinorrhea, sinus pressure, sinus pain, sneezing, sore throat, tinnitus and trouble swallowing.   Eyes:  Negative for pain, discharge, redness, itching and visual disturbance.  Respiratory:  Negative for cough, chest tightness, shortness of breath and wheezing.   Cardiovascular:  Negative for chest pain, palpitations and leg swelling.  Gastrointestinal:  Negative for abdominal distention, abdominal pain, blood in stool, constipation, diarrhea, nausea and vomiting.       Miralax effective   Endocrine: Negative for cold intolerance, heat intolerance, polydipsia, polyphagia and polyuria.  Genitourinary:  Negative for difficulty urinating, dysuria, flank pain, frequency and urgency.  Musculoskeletal:  Positive for arthralgias. Negative for back pain, gait problem, joint swelling, myalgias, neck pain and neck stiffness.       Chronic right shoulder pain  Skin:  Negative for color change, pallor, rash and wound.  Neurological:  Negative for dizziness, syncope, speech difficulty, weakness, light-headedness, numbness and headaches.  Hematological:  Does not bruise/bleed easily.  Psychiatric/Behavioral:  Negative for agitation, behavioral problems, confusion, hallucinations, self-injury, sleep disturbance and suicidal ideas. The patient is not nervous/anxious.      Immunization History  Administered Date(s) Administered   Fluad Quad(high Dose 65+) 10/29/2018, 10/14/2020   Influenza, High Dose Seasonal PF 11/24/2017   Influenza,inj,Quad PF,6+ Mos 09/24/2015, 12/15/2016   Moderna Sars-Covid-2 Vaccination 03/29/2019, 04/26/2019, 11/23/2019, 12/17/2019   Pneumococcal Conjugate-13 12/16/2015   Pneumococcal Polysaccharide-23 06/27/2018   Td 01/17/2017   Td,absorbed, Preservative Free, Adult Use, Lf Unspecified 11/23/2010   Tdap 11/23/2010   Pertinent  Health Maintenance Due  Topic Date Due   INFLUENZA VACCINE  08/10/2021   COLONOSCOPY (Pts 45-68yr Insurance coverage will need to be confirmed)  10/17/2021   DEXA SCAN  Completed      04/06/2021   11:39 AM 04/12/2021    1:14 PM 04/19/2021    3:06 PM 05/26/2021    8:16 AM 07/19/2021   10:38 AM  Fall Risk  Falls in the past year?  0 1 1 0 0  Was there an injury with Fall?  0 0 0 0  Fall Risk Category Calculator  1 1 0 0  Fall Risk Category  Low Low Low Low  Patient Fall Risk Level   Low fall risk Low fall risk Low fall risk  Patient at Risk for Falls Due to   History of fall(s) No Fall Risks No Fall Risks  Fall risk Follow up   Falls evaluation completed Falls evaluation completed Falls evaluation completed   Functional Status Survey:    Vitals:   07/19/21 1036  BP: 120/74  Pulse: 72  Resp: 18  Temp: (!) 97.5 F (36.4 C)  TempSrc: Temporal  SpO2: 95%  Weight: 183 lb (83 kg)  Height: '5\' 9"'  (1.753 m)   Body mass index is 27.02 kg/m. Physical Exam Vitals reviewed.  Constitutional:      General: She is not in acute distress.    Appearance: Normal appearance. She is normal weight. She is not ill-appearing or diaphoretic.  HENT:     Head: Normocephalic.     Right Ear: Tympanic membrane, ear canal and external ear normal. There is no impacted cerumen.     Left Ear: Tympanic membrane, ear canal and external ear normal. There is no impacted cerumen.     Nose: Nose normal. No congestion  or rhinorrhea.     Mouth/Throat:     Mouth: Mucous membranes are moist.     Pharynx: Oropharynx is clear. No oropharyngeal exudate or posterior oropharyngeal erythema.  Eyes:     General: No scleral icterus.       Right eye: No discharge.        Left eye: No discharge.     Extraocular Movements: Extraocular movements intact.     Conjunctiva/sclera: Conjunctivae normal.     Pupils: Pupils are equal, round, and reactive to light.  Neck:     Vascular: No carotid bruit.  Cardiovascular:     Rate and Rhythm: Normal rate and regular rhythm.     Pulses: Normal pulses.     Heart sounds: Normal heart sounds. No murmur heard.    No friction rub. No gallop.  Pulmonary:     Effort: Pulmonary effort is normal. No respiratory distress.     Breath sounds: Normal breath sounds. No wheezing, rhonchi or rales.  Chest:     Chest wall: No tenderness.  Abdominal:     General: Bowel sounds are normal. There is no distension.     Palpations: Abdomen is soft. There is no mass.     Tenderness: There is no abdominal tenderness. There is no right CVA tenderness, left CVA tenderness, guarding or rebound.  Musculoskeletal:        General: No swelling or tenderness.     Right shoulder: No swelling, effusion, tenderness or crepitus. Decreased range of motion. Normal strength. Normal pulse.     Left shoulder: Normal.     Cervical back: Normal range of motion. No rigidity or tenderness.     Right lower leg: No edema.     Left lower leg: No edema.  Lymphadenopathy:     Cervical: No cervical adenopathy.  Skin:    General: Skin is warm and dry.     Coloration: Skin is not pale.     Findings: No bruising, erythema, lesion or rash.  Neurological:     Mental Status: She is alert and oriented to person, place, and time.     Cranial Nerves: No  cranial nerve deficit.     Sensory: No sensory deficit.     Motor: No weakness.     Coordination: Coordination normal.     Gait: Gait normal.  Psychiatric:        Mood  and Affect: Mood normal.        Speech: Speech normal.        Behavior: Behavior normal.        Thought Content: Thought content normal.        Judgment: Judgment normal.     Labs reviewed: Recent Labs    10/14/20 1544 01/13/21 0936  NA 140 142  K 4.3 4.1  CL 107 106  CO2 22 26  GLUCOSE 92 106*  BUN 15 10  CREATININE 0.79 0.74  CALCIUM 9.7 9.5   Recent Labs    10/14/20 1544 01/13/21 0936  AST 16 15  ALT 10 10  BILITOT 0.7 0.6  PROT 6.8 6.7   Recent Labs    01/13/21 0936  WBC 5.5  NEUTROABS 3,333  HGB 12.6  HCT 40.5  MCV 78.0*  PLT 322   Lab Results  Component Value Date   TSH 1.86 01/13/2021   Lab Results  Component Value Date   HGBA1C 5.9 (H) 01/13/2021   Lab Results  Component Value Date   CHOL 232 (H) 01/13/2021   HDL 55 01/13/2021   LDLCALC 154 (H) 01/13/2021   TRIG 116 01/13/2021   CHOLHDL 4.2 01/13/2021    Significant Diagnostic Results in last 30 days:  No results found.  Assessment/Plan Problem List Items Addressed This Visit       Cardiovascular and Mediastinum   Essential hypertension - Primary    Blood pressure well controlled -Continue on amlodipine and lisinopril,      Relevant Orders   TSH   COMPLETE METABOLIC PANEL WITH GFR   CBC with Differential/Platelet (Completed)     Other   Prediabetes    Lab Results  Component Value Date   HGBA1C 5.9 (H) 01/13/2021  -Dietary modification and exercise advised      Relevant Orders   TSH   Hemoglobin A1c   Need for 23-polyvalent pneumococcal polysaccharide vaccine    Second dose of pneumococcal 23 vaccine administered today by CMA no reaction reported. -Advised to take over-the-counter Tylenol as needed for pain if she develops any soreness or achiness or running any fever or chills.       Mixed hyperlipidemia    Previous LDL not at goal 154 Statin intolerance -Continue with dietary modification and exercise      Relevant Orders   Lipid panel   Depression    Mood  stable -Continue on sertraline -Plans to go for cruise with friends in August.      Relevant Medications   sertraline (ZOLOFT) 25 MG tablet   Other Relevant Orders   TSH   Chronic right shoulder pain    Limited range of motion.  Has upcoming appointment with orthopedic in the morning for possible cortisone injection. -Continue to monitor      Relevant Medications   sertraline (ZOLOFT) 25 MG tablet   Family/ staff Communication: Reviewed plan of care with patient verbalized understanding  Labs/tests ordered: - CBC with Differential/Platelet - CMP with eGFR([facility]) - TSH - Hgb A1C - Lipid panel   Next Appointment : Return in about 6 months (around 01/19/2022) for medical mangement of chronic issues.Sandrea Hughs, NP

## 2021-07-20 ENCOUNTER — Encounter: Payer: Self-pay | Admitting: Student in an Organized Health Care Education/Training Program

## 2021-07-20 ENCOUNTER — Encounter: Payer: Medicare Other | Admitting: Family

## 2021-07-20 ENCOUNTER — Ambulatory Visit
Admission: RE | Admit: 2021-07-20 | Discharge: 2021-07-20 | Disposition: A | Discharge status: Home / Self Care | Payer: Medicare Other | Attending: Student in an Organized Health Care Education/Training Program | Admitting: Student in an Organized Health Care Education/Training Program

## 2021-07-20 ENCOUNTER — Ambulatory Visit: Payer: Medicare Other | Admitting: Student in an Organized Health Care Education/Training Program

## 2021-07-20 ENCOUNTER — Ambulatory Visit
Admission: RE | Admit: 2021-07-20 | Discharge: 2021-07-20 | Disposition: A | Discharge status: Home / Self Care | Payer: Medicare Other | Source: Ambulatory Visit | Attending: Student in an Organized Health Care Education/Training Program | Admitting: Student in an Organized Health Care Education/Training Program

## 2021-07-20 VITALS — BP 127/92 | Temp 97.1°F | Resp 16 | Ht 69.0 in | Wt 183.0 lb

## 2021-07-20 DIAGNOSIS — M19011 Primary osteoarthritis, right shoulder: Secondary | ICD-10-CM | POA: Insufficient documentation

## 2021-07-20 DIAGNOSIS — M12811 Other specific arthropathies, not elsewhere classified, right shoulder: Secondary | ICD-10-CM | POA: Insufficient documentation

## 2021-07-20 DIAGNOSIS — G894 Chronic pain syndrome: Secondary | ICD-10-CM | POA: Insufficient documentation

## 2021-07-20 DIAGNOSIS — M542 Cervicalgia: Secondary | ICD-10-CM

## 2021-07-20 DIAGNOSIS — M75101 Unspecified rotator cuff tear or rupture of right shoulder, not specified as traumatic: Secondary | ICD-10-CM | POA: Insufficient documentation

## 2021-07-20 LAB — COMPLETE METABOLIC PANEL WITH GFR
AG Ratio: 1.8 (calc) (ref 1.0–2.5)
ALT: 12 U/L (ref 6–29)
AST: 14 U/L (ref 10–35)
Albumin: 4.3 g/dL (ref 3.6–5.1)
Alkaline phosphatase (APISO): 74 U/L (ref 37–153)
BUN: 15 mg/dL (ref 7–25)
CO2: 24 mmol/L (ref 20–32)
Calcium: 9.6 mg/dL (ref 8.6–10.4)
Chloride: 108 mmol/L (ref 98–110)
Creat: 0.78 mg/dL (ref 0.60–1.00)
Globulin: 2.4 g/dL (calc) (ref 1.9–3.7)
Glucose, Bld: 94 mg/dL (ref 65–99)
Potassium: 4.4 mmol/L (ref 3.5–5.3)
Sodium: 143 mmol/L (ref 135–146)
Total Bilirubin: 0.6 mg/dL (ref 0.2–1.2)
Total Protein: 6.7 g/dL (ref 6.1–8.1)
eGFR: 79 mL/min/{1.73_m2} (ref >=60)

## 2021-07-20 LAB — CBC WITH DIFFERENTIAL/PLATELET
Absolute Monocytes: 369 cells/uL (ref 200–950)
Basophils Absolute: 28 cells/uL (ref 0–200)
Basophils Relative: 0.5 %
Eosinophils Absolute: 121 cells/uL (ref 15–500)
Eosinophils Relative: 2.2 %
HCT: 37.8 % (ref 35.0–45.0)
Hemoglobin: 11.7 g/dL (ref 11.7–15.5)
Lymphs Abs: 1491 cells/uL (ref 850–3900)
MCH: 24.2 pg — ABNORMAL LOW (ref 27.0–33.0)
MCHC: 31 g/dL — ABNORMAL LOW (ref 32.0–36.0)
MCV: 78.1 fL — ABNORMAL LOW (ref 80.0–100.0)
MPV: 10.9 fL (ref 7.5–12.5)
Monocytes Relative: 6.7 %
Neutro Abs: 3493 cells/uL (ref 1500–7800)
Neutrophils Relative %: 63.5 %
Platelets: 363 10*3/uL (ref 140–400)
RBC: 4.84 10*6/uL (ref 3.80–5.10)
RDW: 16.3 % — ABNORMAL HIGH (ref 11.0–15.0)
Total Lymphocyte: 27.1 %
WBC: 5.5 10*3/uL (ref 3.8–10.8)

## 2021-07-20 LAB — HEMOGLOBIN A1C
Hgb A1c MFr Bld: 5.9 % of total Hgb — ABNORMAL HIGH (ref <5.7)
Mean Plasma Glucose: 123 mg/dL
eAG (mmol/L): 6.8 mmol/L

## 2021-07-20 LAB — LIPID PANEL
Cholesterol: 215 mg/dL — ABNORMAL HIGH (ref <200)
HDL: 52 mg/dL (ref >=50)
LDL Cholesterol (Calc): 142 mg/dL (calc) — ABNORMAL HIGH
Non-HDL Cholesterol (Calc): 163 mg/dL (calc) — ABNORMAL HIGH (ref <130)
Total CHOL/HDL Ratio: 4.1 (calc) (ref <5.0)
Triglycerides: 101 mg/dL (ref <150)

## 2021-07-20 LAB — TSH: TSH: 1 mIU/L (ref 0.40–4.50)

## 2021-07-20 MED ORDER — PREGABALIN 25 MG PO CAPS: ORAL_CAPSULE | ORAL | 0 refills | Status: DC

## 2021-07-20 MED ORDER — HYDROCODONE-ACETAMINOPHEN 5-325 MG PO TABS: 1.0000 | ORAL_TABLET | Freq: Two times a day (BID) | ORAL | 0 refills | Status: AC | PRN

## 2021-07-20 NOTE — Progress Notes (Signed)
PROVIDER NOTE: Information contained herein reflects review and annotations entered in association with encounter. Interpretation of such information and data should be left to medically-trained personnel. Information provided to patient can be located elsewhere in the medical record under "Patient Instructions". Document created using STT-dictation technology, any transcriptional errors that may result from process are unintentional.    Patient: Kaylee Warner  Service Category: E/M  Provider: Gillis Santa, MD  DOB: 04/04/1945  DOS: 07/20/2021  Specialty: Interventional Pain Management  MRN: [id number]  Setting: Ambulatory outpatient  PCP: Sandrea Hughs, NP  Type: Established Patient    Referring Provider: Sandrea Hughs, NP  Location: Office  Delivery: Face-to-face     HPI  Ms. Kaylee Warner, a 76 y.o. year old female, is here today because of Kaylee Warner Right rotator cuff tear arthropathy [M75.101, M12.811]. Kaylee Warner primary complain today is No chief complaint on file. Last encounter: My last encounter with Kaylee Warner was on 05/18/21 Pertinent problems: Kaylee Warner has Rotator cuff syndrome of right shoulder; Chronic pain syndrome; Depression; Right rotator cuff tear arthropathy; Spinal stenosis, lumbar region, with neurogenic claudication; Lumbar facet arthropathy; Lumbar degenerative disc disease; and Chronic radicular lumbar pain on their pertinent problem list. Pain Assessment: Severity of Chronic pain is reported as a 10-Worst pain ever/10. Location: Shoulder Right/right arm and back of head. Onset: More than a month ago. Quality: Pressure. Timing: Constant. Modifying factor(s): nothing. Vitals:  height is '5\' 9"'  (1.753 m) and weight is 183 lb (83 kg). Kaylee Warner temporal temperature is 97.1 F (36.2 C) (abnormal). Kaylee Warner blood pressure is 127/92 (abnormal). Kaylee Warner respiration is 16.   Reason for encounter: evaluation of worsening, or previously known (established) problem.   Kaylee Warner presents today for worsening  right shoulder pain related to shoulder osteoarthritis and rotator cuff dysfunction.  She is status post right shoulder steroid injection on 04/12/2021 that provided Kaylee Warner with approximately 75 to 80% pain relief for 3 months and now she is having fairly significant right shoulder pain. She describes soreness in Kaylee Warner shoulder.  She would like to repeat right shoulder. (POSTERIOR APPROACH)  She states that Kaylee Warner right knee is still doing well from Kaylee Warner previous knee injection.  We can repeat in the future should she have return of Kaylee Warner right knee pain.  She states that gabapentin was not effective.  She has exhausted NSAIDs, physical therapy.  She states that she is having severe pain, we will consider low-dose hydrocodone as needed for breakthrough pain.  She will need to renew Kaylee Warner annual urine toxicology screen.   ROS  Constitutional: Denies any fever or chills Gastrointestinal: No reported hemesis, hematochezia, vomiting, or acute GI distress Musculoskeletal:  Right shoulder Neurological: No reported episodes of acute onset apraxia, aphasia, dysarthria, agnosia, amnesia, paralysis, loss of coordination, or loss of consciousness  Medication Review  HYDROcodone-acetaminophen, Vitamin D3, Zinc Sulfate, amLODipine, aspirin EC, atorvastatin, cetirizine, donepezil, fluticasone, lisinopril, meloxicam, pregabalin, sertraline, and tiZANidine  History Review  Allergy: Kaylee Warner is allergic to celecoxib, codeine, crestor [rosuvastatin calcium], fexofenadine, and penicillins. Drug: Kaylee Warner  reports no history of drug use. Alcohol:  reports current alcohol use of about 7.0 standard drinks of alcohol per week. Tobacco:  reports that she has never smoked. She has never used smokeless tobacco. Social: Kaylee Warner  reports that she has never smoked. She has never used smokeless tobacco. She reports current alcohol use of about 7.0 standard drinks of alcohol per week. She reports that she does not use drugs. Medical:  has a past medical history of Allergy, Anemia, Arthritis, and Hypertension. Surgical: Kaylee Warner  has a past surgical history that includes SARK 2009 and Colonoscopy. Family: family history includes Arthritis in an other family member; Diabetes in Kaylee Warner sister, son, son, and another family member; Hypertension in Kaylee Warner son; Sjogren's syndrome in Kaylee Warner daughter.  Laboratory Chemistry Profile   Renal Lab Results  Component Value Date   BUN 15 07/19/2021   CREATININE 0.78 93/90/3009   BCR NOT APPLICABLE 23/30/0762   GFRAA 106 05/08/2019   GFRNONAA 92 05/08/2019    Hepatic Lab Results  Component Value Date   AST 14 07/19/2021   ALT 12 07/19/2021   ALBUMIN 4.0 12/16/2015   ALKPHOS 53 12/16/2015    Electrolytes Lab Results  Component Value Date   NA 143 07/19/2021   K 4.4 07/19/2021   CL 108 07/19/2021   CALCIUM 9.6 07/19/2021    Bone No results found for: "VD25OH", "VD125OH2TOT", "UQ3335KT6", "YB6389HT3", "25OHVITD1", "25OHVITD2", "25OHVITD3", "TESTOFREE", "TESTOSTERONE"  Inflammation (CRP: Acute Phase) (ESR: Chronic Phase) No results found for: "CRP", "ESRSEDRATE", "LATICACIDVEN"       Note: Above Lab results reviewed.  Physical Exam  General appearance: Well nourished, well developed, and well hydrated. In no apparent acute distress Mental status: Alert, oriented x 3 (person, place, & time)       Respiratory: No evidence of acute respiratory distress Eyes: PERLA Vitals: BP (!) 127/92   Temp (!) 97.1 F (36.2 C) (Temporal)   Resp 16   Ht '5\' 9"'  (1.753 m)   Wt 183 lb (83 kg)   BMI 27.02 kg/m  BMI: Estimated body mass index is 27.02 kg/m as calculated from the following:   Height as of this encounter: '5\' 9"'  (1.753 m).   Weight as of this encounter: 183 lb (83 kg). Ideal: Ideal body weight: 66.2 kg (145 lb 15.1 oz) Adjusted ideal body weight: 72.9 kg (160 lb 12.3 oz)  Cervical Spine Exam  Skin & Axial Inspection: No masses, redness, edema, swelling, or associated skin  lesions Alignment: Symmetrical Functional ROM: Unrestricted ROM      Stability: No instability detected Muscle Tone/Strength: Functionally intact. No obvious neuro-muscular anomalies detected. Sensory (Neurological):  Musculoskeletal Palpation: No palpable anomalies                          Upper Extremity (UE) Exam      Side: Right upper extremity   Side: Left upper extremity    Skin & Extremity Inspection: Skin color, temperature, and hair growth are WNL. No peripheral edema or cyanosis. No masses, redness, swelling, asymmetry, or associated skin lesions. No contractures.   Skin & Extremity Inspection: Skin color, temperature, and hair growth are WNL. No peripheral edema or cyanosis. No masses, redness, swelling, asymmetry, or associated skin lesions. No contractures.    Functional ROM: Diminished ROM for shoulder   Functional ROM: Unrestricted ROM            Muscle Tone/Strength: Movement possible against some resistance (4/5) right shoulder     Muscle Tone/Strength: Functionally intact. No obvious neuro-muscular anomalies detected.    Sensory (Neurological): Arthropathic arthralgia           Sensory (Neurological): Unimpaired            Palpation: No palpable anomalies               Palpation: No palpable anomalies  Provocative Test(s):  Phalen's test: deferred Tinel's test: deferred Apley's scratch test (touch opposite shoulder):  Action 1 (Across chest): Decreased ROM Action 2 (Overhead): Decreased ROM Action 3 (LB reach): Decreased ROM     Provocative Test(s):  Phalen's test: deferred Tinel's test: deferred Apley's scratch test (touch opposite shoulder):  Action 1 (Across chest): deferred Action 2 (Overhead): deferred Action 3 (LB reach): deferred          Assessment   Status Diagnosis  Having a Flare-up Having a Flare-up Having a Flare-up 1. Right rotator cuff tear arthropathy   2. Arthropathy of right shoulder   3. Cervicalgia   4. Chronic pain  syndrome      Updated Problems: Problem  Right Rotator Cuff Tear Arthropathy  Cervicalgia    Plan of Care   Kaylee Warner has a current medication list which includes the following long-term medication(s): amlodipine, cetirizine, donepezil, fluticasone, lisinopril, pregabalin, sertraline, and atorvastatin.   1. Right rotator cuff tear arthropathy - SHOULDER INJECTION; Future  2. Arthropathy of right shoulder - SHOULDER INJECTION; Future  3. Cervicalgia - DG Cervical Spine Complete; Future - TRIGGER POINT INJECTION; Future - pregabalin (LYRICA) 25 MG capsule; Take 1 capsule (25 mg total) by mouth daily for 15 days, THEN 1 capsule (25 mg total) 2 (two) times daily for 15 days.  Dispense: 45 capsule; Refill: 0  4. Chronic pain syndrome - ToxASSURE Select 13 (MW), Urine - HYDROcodone-acetaminophen (NORCO/VICODIN) 5-325 MG tablet; Take 1 tablet by mouth 2 (two) times daily as needed for severe pain. Must last 30 days.  Dispense: 60 tablet; Refill: 0 - pregabalin (LYRICA) 25 MG capsule; Take 1 capsule (25 mg total) by mouth daily for 15 days, THEN 1 capsule (25 mg total) 2 (two) times daily for 15 days.  Dispense: 45 capsule; Refill: 0    Pharmacotherapy (Medications Ordered): Meds ordered this encounter  Medications   HYDROcodone-acetaminophen (NORCO/VICODIN) 5-325 MG tablet    Sig: Take 1 tablet by mouth 2 (two) times daily as needed for severe pain. Must last 30 days.    Dispense:  60 tablet    Refill:  0    Chronic Pain: STOP Act (Not applicable) Fill 1 day early if closed on refill date. Avoid benzodiazepines within 8 hours of opioids   pregabalin (LYRICA) 25 MG capsule    Sig: Take 1 capsule (25 mg total) by mouth daily for 15 days, THEN 1 capsule (25 mg total) 2 (two) times daily for 15 days.    Dispense:  45 capsule    Refill:  0   Orders:  Orders Placed This Encounter  Procedures   SHOULDER INJECTION    Standing Status:   Future    Standing Expiration Date:    09/20/2021    Scheduling Instructions:     Right glenohumeral joint injection    Order Specific Question:   Where will this procedure be performed?    Answer:   [facility]   TRIGGER POINT INJECTION    Standing Status:   Future    Standing Expiration Date:   10/20/2021    Scheduling Instructions:     Cervical    Order Specific Question:   Where will this procedure be performed?    Answer:   [facility]   DG Cervical Spine Complete    Patient presents with axial pain with possible radicular component. Please assist [place] in identifying specific level(s) and laterality of any additional findings such as: 1. Facet (  Zygapophyseal) joint DJD (Hypertrophy, space narrowing, subchondral sclerosis, and/or osteophyte formation) 2. DDD and/or IVDD (Loss of disc height, desiccation, gas patterns, osteophytes, endplate sclerosis, or "Black disc disease") 3. Pars defects 4. Spondylolisthesis, spondylosis, and/or spondyloarthropathies (include Degree/Grade of displacement in mm) (stability) 5. Vertebral body Fractures (acute/chronic) (state percentage of collapse) 6. Demineralization (osteopenia/osteoporotic) 7. Bone pathology 8. Foraminal narrowing  9. Surgical changes    Standing Status:   Future    Number of Occurrences:   1    Standing Expiration Date:   10/20/2021    Scheduling Instructions:     Please contact patient and remind Kaylee Warner that the order has a limited expiration date and that radiology needs time to read the study.    Order Specific Question:   Reason for Exam (SYMPTOM  OR DIAGNOSIS REQUIRED)    Answer:   Cervicalgia    Order Specific Question:   Preferred imaging location?    Answer:   [facility]    Order Specific Question:   Call Results- Best Contact Number?    Answer:   [phone]   ToxASSURE Select 13 (MW), Urine    Volume: 30 ml(s). Minimum 3 ml of urine is needed. Document temperature of fresh sample. Indications: Long term (current) use of opiate  analgesic [phone])    Order Specific Question:   Release to patient    Answer:   Immediate   Follow-up plan:   Return in about 1 week (around 07/27/2021) for Right shoulder injection & Right cervical TPI , in clinic [facility].    Recent Visits Date Type Provider Dept  05/04/21 Office Visit Gillis Santa, MD [facility]  Showing recent visits within past 90 days and meeting all other requirements Today's Visits Date Type Provider Dept  07/20/21 Office Visit Gillis Santa, MD [facility]  Showing today's visits and meeting all other requirements Future Appointments Date Type Provider Dept  08/02/21 Appointment Gillis Santa, MD [facility]  Showing future appointments within next 90 days and meeting all other requirements  I discussed the assessment and treatment plan with the patient. The patient was provided an opportunity to ask questions and all were answered. The patient agreed with the plan and demonstrated an understanding of the instructions.  Patient advised to call back or seek an in-person evaluation if the symptoms or condition worsens.  Duration of encounter: 16mnutes.  Note by: BGillis Santa MD Date: 07/20/2021; Time: 10:06 AM

## 2021-07-20 NOTE — Patient Instructions (Addendum)
You will have xrays completed prior to next appt.  With pain clinic.  Trigger Point Injection Trigger points are areas where you have pain. A trigger point injection is a shot given in the trigger point to help relieve pain for a few days to a few months. Common places for trigger points include the neck, shoulders, upper back, or lower back. A trigger point injection will not cure long-term (chronic) pain permanently. These injections do not always work for every person. For some people, they can help to relieve pain for a few days to a few months. Tell a health care provider about: Any allergies you have. All medicines you are taking, including vitamins, herbs, eye drops, creams, and over-the-counter medicines. Any problems you or family members have had with anesthetic medicines. Any bleeding problems you have. Any surgeries you have had. Any medical conditions you have. Whether you are pregnant or may be pregnant. What are the risks? Generally, this is a safe procedure. However, problems may occur, including: Infection. Bleeding or bruising. Allergic reaction to the injected medicine. Irritation of the skin around the injection site. What happens before the procedure? Ask your health care provider about: Changing or stopping your regular medicines. This is especially important if you are taking diabetes medicines or blood thinners. Taking medicines such as aspirin and ibuprofen. These medicines can thin your blood. Do not take these medicines unless your health care provider tells you to take them. Taking over-the-counter medicines, vitamins, herbs, and supplements. What happens during the procedure?  Your health care provider will feel for trigger points. A marker may be used to circle the area for the injection. The skin over the trigger point will be washed with a germ-killing soap. You may be given a medicine to help you relax (sedative). A thin needle is used for the injection.  You may feel pain or a twitching feeling when the needle enters your skin. A numbing solution may be injected into the trigger point. Sometimes a medicine to keep down inflammation is also injected. Your health care provider may move the needle around the area where the trigger point is located until the tightness and twitching goes away. After the injection, your health care provider may put gentle pressure over the injection site. The injection site will be covered with a bandage (dressing). The procedure may vary among health care providers and hospitals. What can I expect after treatment? After treatment, you may have soreness and stiffness for 1-2 days. Follow these instructions at home: Injection site care Remove your dressing in a few hours, or as told by your health care provider. Check your injection site every day for signs of infection. Check for: Redness, swelling, or pain. Fluid or blood. Warmth. Pus or a bad smell. Managing pain, stiffness, and swelling If directed, put ice on the affected area. To do this: Put ice in a plastic bag. Place a towel between your skin and the bag. Leave the ice on for 20 minutes, 2-3 times a day. Remove the ice if your skin turns bright red. This is very important. If you cannot feel pain, heat, or cold, you have a greater risk of damage to the area. Activity If you were given a sedative during the procedure, it can affect you for several hours. Do not drive or operate machinery until your health care provider says that it is safe. Do not take baths, swim, or use a hot tub until your health care provider approves. Return to your normal  activities as told by your health care provider. Ask your health care provider what activities are safe for you. General instructions If you were asked to stop your regular medicines, ask your health care provider when you may start taking them again. You may be asked to see an occupational or physical therapist  for exercises that reduce muscle strain and stretch the area of the trigger point. Keep all follow-up visits. This is important. Contact a health care provider if: Your pain comes back, and it is worse than before the injection. You may need more injections. You have chills or a fever. The injection site becomes more painful, red, swollen, or warm to the touch. Summary A trigger point injection is a shot given in the trigger point to help relieve pain. Common places for trigger point injections are the neck, shoulders, upper back, and lower back. These injections do not always work for every person, but for some people, the injections can help to relieve pain for a few days to a few months. Contact a health care provider if symptoms come back or if they are worse than before treatment. Also, get help if the injection site becomes more painful, red, swollen, or warm to the touch. This information is not intended to replace advice given to you by your health care provider. Make sure you discuss any questions you have with your health care provider. Document Revised: 04/07/2020 Document Reviewed: 04/07/2020 Else[employer] Patient Education  [employer].

## 2021-07-23 ENCOUNTER — Telehealth: Payer: Self-pay | Admitting: Student in an Organized Health Care Education/Training Program

## 2021-07-23 NOTE — Telephone Encounter (Signed)
Patient would like to know results of xrays

## 2021-07-23 NOTE — Telephone Encounter (Signed)
Impression read to patient.

## 2021-07-24 LAB — TOXASSURE SELECT 13 (MW), URINE

## 2021-07-27 ENCOUNTER — Telehealth: Payer: Self-pay | Admitting: Student in an Organized Health Care Education/Training Program

## 2021-07-27 NOTE — Telephone Encounter (Signed)
Patient is still in pain and wants to know if she can get some to take until her appt that is schedule for 08-02-21. Patient states that her shoulder is swollen. Please give patient a call.I did tell patient if an appt comes available tomorrow I will call her. Thanks

## 2021-07-27 NOTE — Telephone Encounter (Signed)
She already takes Hydrocodone and Lyrica

## 2021-08-02 ENCOUNTER — Other Ambulatory Visit: Payer: Self-pay | Admitting: Family

## 2021-08-02 ENCOUNTER — Ambulatory Visit
Payer: Medicare Other | Attending: Student in an Organized Health Care Education/Training Program | Admitting: Student in an Organized Health Care Education/Training Program

## 2021-08-02 ENCOUNTER — Encounter: Payer: Self-pay | Admitting: Student in an Organized Health Care Education/Training Program

## 2021-08-02 ENCOUNTER — Ambulatory Visit
Admission: RE | Admit: 2021-08-02 | Discharge: 2021-08-02 | Disposition: A | Discharge status: Home / Self Care | Payer: Medicare Other | Source: Ambulatory Visit | Attending: Student in an Organized Health Care Education/Training Program | Admitting: Student in an Organized Health Care Education/Training Program

## 2021-08-02 VITALS — BP 161/96 | HR 84 | Temp 97.5°F | Resp 17 | Ht 69.0 in | Wt 183.0 lb

## 2021-08-02 DIAGNOSIS — G894 Chronic pain syndrome: Secondary | ICD-10-CM

## 2021-08-02 DIAGNOSIS — M542 Cervicalgia: Secondary | ICD-10-CM | POA: Diagnosis not present

## 2021-08-02 DIAGNOSIS — M75101 Unspecified rotator cuff tear or rupture of right shoulder, not specified as traumatic: Secondary | ICD-10-CM | POA: Insufficient documentation

## 2021-08-02 DIAGNOSIS — M19011 Primary osteoarthritis, right shoulder: Secondary | ICD-10-CM

## 2021-08-02 DIAGNOSIS — M12811 Other specific arthropathies, not elsewhere classified, right shoulder: Secondary | ICD-10-CM | POA: Diagnosis not present

## 2021-08-02 DIAGNOSIS — R413 Other amnesia: Secondary | ICD-10-CM

## 2021-08-02 MED ORDER — LIDOCAINE HCL 2 % IJ SOLN
INTRAMUSCULAR | Status: AC
  Filled 2021-08-02: qty 20

## 2021-08-02 MED ORDER — METHYLPREDNISOLONE ACETATE 40 MG/ML IJ SUSP
40.0000 mg | Freq: Once | INTRAMUSCULAR | Status: AC
  Administered 2021-08-02: 40 mg via INTRA_ARTICULAR

## 2021-08-02 MED ORDER — METHYLPREDNISOLONE ACETATE 40 MG/ML IJ SUSP
INTRAMUSCULAR | Status: AC
  Filled 2021-08-02: qty 1

## 2021-08-02 MED ORDER — LIDOCAINE HCL 2 % IJ SOLN
20.0000 mL | Freq: Once | INTRAMUSCULAR | Status: AC
Start: 2021-08-02 — End: 2021-08-02
  Administered 2021-08-02: 400 mg

## 2021-08-02 MED ORDER — IOHEXOL 180 MG/ML  SOLN
10.0000 mL | Freq: Once | INTRAMUSCULAR | Status: AC
Start: 2021-08-02 — End: 2021-08-02
  Administered 2021-08-02: 10 mL via INTRA_ARTICULAR
  Filled 2021-08-02: qty 20

## 2021-08-02 MED ORDER — ROPIVACAINE HCL 2 MG/ML IJ SOLN
4.0000 mL | Freq: Once | INTRAMUSCULAR | Status: AC
  Administered 2021-08-02: 4 mL via INTRA_ARTICULAR

## 2021-08-02 MED ORDER — ROPIVACAINE HCL 2 MG/ML IJ SOLN
INTRAMUSCULAR | Status: AC
  Filled 2021-08-02: qty 20

## 2021-08-02 NOTE — Progress Notes (Signed)
Safety precautions to be maintained throughout the outpatient stay will include: orient to surroundings, keep bed in low position, maintain call bell within reach at all times, provide assistance with transfer out of bed and ambulation.  

## 2021-08-02 NOTE — Patient Instructions (Signed)

## 2021-08-02 NOTE — Progress Notes (Signed)
PROVIDER NOTE: Interpretation of information contained herein should be left to medically-trained personnel. Specific patient instructions are provided elsewhere under "Patient Instructions" section of medical record. This document was created in part using STT-dictation technology, any transcriptional errors that may result from this process are unintentional.  Patient: Kaylee Warner Type: Established DOB: 1945/03/15 MRN: [id number] PCP: Sandrea Hughs, NP  Service: Procedure DOS: 08/02/2021 Setting: Ambulatory Location: Ambulatory outpatient facility Delivery: Face-to-face Provider: Gillis Santa, MD Specialty: Interventional Pain Management Specialty designation: 09 Location: Outpatient facility Ref. Prov.: Ngetich, Nelda Bucks, NP    Interventional Therapy    Procedure: Glenohumeral Joint (shoulder) Injection #1 and right trapezius/cervical TPI Laterality: Right (-RT)  Level: Shoulder   Imaging: Fluoroscopy-guided Anesthesia: Local anesthesia (1-2% Lidocaine) DOS: 08/02/2021  Performed by: Gillis Santa, MD  Purpose: Diagnostic/Therapeutic Indications: Shoulder pain severe enough to impact quality of life or function. Rationale (medical necessity): procedure needed and proper for the diagnosis and/or treatment of Kaylee Warner's medical symptoms and needs. 1. Right rotator cuff tear arthropathy   2. Arthropathy of right shoulder   3. Cervicalgia   4. Chronic pain syndrome    NAS-11 Pain score:   Pre-procedure: 9 /10   Post-procedure: 9 /10      Target: Glenohumeral Joint (shoulder) Location: Intra-articular  Region: Entire Shoulder Area Approach: Posterolateral approach. Type of procedure: Percutaneous joint injection   Position  Prep  Materials:  Position: Supine Prep solution: DuraPrep (Iodine Povacrylex [0.7% available iodine] and Isopropyl Alcohol, 74% w/w) Prep Area: Entire shoulder Area Materials:  Tray: Block Needle(s):  Type: Spinal  Gauge (G): 25  Length:  3.5-in  Qty: 1  Pre-op H&P Assessment:  Kaylee Warner is a 76 y.o. (year old), female patient, seen today for interventional treatment. She  has a past surgical history that includes [place] 2009 and Colonoscopy. Kaylee Warner has a current medication list which includes the following prescription(s): amlodipine, aspirin ec, cetirizine, vitamin d3, donepezil, fluticasone, hydrocodone-acetaminophen, lisinopril, pregabalin, sertraline, tizanidine, atorvastatin, meloxicam, and zinc sulfate. Her primarily concern today is the Shoulder Pain (Right) and Neck Pain  Initial Vital Signs:  Pulse/HCG Rate: 81  Temp: (!) 97.5 F (36.4 C) Resp: 16 BP: (!) 154/91 SpO2: 98 %  BMI: Estimated body mass index is 27.02 kg/m as calculated from the following:   Height as of this encounter: '5\' 9"'$  (1.753 m).   Weight as of this encounter: 183 lb (83 kg).  Risk Assessment: Allergies: Reviewed. She is allergic to celecoxib, codeine, crestor [rosuvastatin calcium], fexofenadine, and penicillins.  Allergy Precautions: None required Coagulopathies: Reviewed. None identified.  Blood-thinner therapy: None at this time Active Infection(s): Reviewed. None identified. Kaylee Warner is afebrile  Site Confirmation: Kaylee Warner was asked to confirm the procedure and laterality before marking the site Procedure checklist: Completed Consent: Before the procedure and under the influence of no sedative(s), amnesic(s), or anxiolytics, the patient was informed of the treatment options, risks and possible complications. To fulfill our ethical and legal obligations, as recommended by the American Medical Association's Code of Ethics, I have informed the patient of my clinical impression; the nature and purpose of the treatment or procedure; the risks, benefits, and possible complications of the intervention; the alternatives, including doing nothing; the risk(s) and benefit(s) of the alternative treatment(s) or procedure(s); and the risk(s) and  benefit(s) of doing nothing. The patient was provided information about the general risks and possible complications associated with the procedure. These may include, but are not limited to: failure to achieve desired goals,  infection, bleeding, organ or nerve damage, allergic reactions, paralysis, and death. In addition, the patient was informed of those risks and complications associated to the procedure, such as failure to decrease pain; infection; bleeding; organ or nerve damage with subsequent damage to sensory, motor, and/or autonomic systems, resulting in permanent pain, numbness, and/or weakness of one or several areas of the body; allergic reactions; (i.e.: anaphylactic reaction); and/or death. Furthermore, the patient was informed of those risks and complications associated with the medications. These include, but are not limited to: allergic reactions (i.e.: anaphylactic or anaphylactoid reaction(s)); adrenal axis suppression; blood sugar elevation that in diabetics may result in ketoacidosis or comma; water retention that in patients with history of congestive heart failure may result in shortness of breath, pulmonary edema, and decompensation with resultant heart failure; weight gain; swelling or edema; medication-induced neural toxicity; particulate matter embolism and blood vessel occlusion with resultant organ, and/or nervous system infarction; and/or aseptic necrosis of one or more joints. Finally, the patient was informed that Medicine is not an exact science; therefore, there is also the possibility of unforeseen or unpredictable risks and/or possible complications that may result in a catastrophic outcome. The patient indicated having understood very clearly. We have given the patient no guarantees and we have made no promises. Enough time was given to the patient to ask questions, all of which were answered to the patient's satisfaction. Kaylee Warner has indicated that she wanted to continue  with the procedure. Attestation: I, the ordering provider, attest that I have discussed with the patient the benefits, risks, side-effects, alternatives, likelihood of achieving goals, and potential problems during recovery for the procedure that I have provided informed consent. Date  Time: 08/02/2021  2:28 PM   Imaging Guidance (Non-Spinal):          Type of Imaging Technique: Fluoroscopy Guidance (Non-Spinal) Indication(s): Assistance in needle guidance and placement for procedures requiring needle placement in or near specific anatomical locations not easily accessible without such assistance. Exposure Time: Please see nurses notes. Contrast: Before injecting any contrast, we confirmed that the patient did not have an allergy to iodine, shellfish, or radiological contrast. Once satisfactory needle placement was completed at the desired level, radiological contrast was injected. Contrast injected under live fluoroscopy. No contrast complications. See chart for type and volume of contrast used. Fluoroscopic Guidance: I was personally present during the use of fluoroscopy. "Tunnel Vision Technique" used to obtain the best possible view of the target area. Parallax error corrected before commencing the procedure. "Direction-depth-direction" technique used to introduce the needle under continuous pulsed fluoroscopy. Once target was reached, antero-posterior, oblique, and lateral fluoroscopic projection used confirm needle placement in all planes. Images permanently stored in EMR. Interpretation: I personally interpreted the imaging intraoperatively. Adequate needle placement confirmed in multiple planes. Appropriate spread of contrast into desired area was observed. No evidence of afferent or efferent intravascular uptake. Permanent images saved into the patient's record.  Pre-Procedure Preparation:  Monitoring: As per clinic protocol. Respiration, ETCO2, SpO2, BP, heart rate and rhythm monitor placed  and checked for adequate function Safety Precautions: Patient was assessed for positional comfort and pressure points before starting the procedure. Time-out: I initiated and conducted the "Time-out" before starting the procedure, as per protocol. The patient was asked to participate by confirming the accuracy of the "Time Out" information. Verification of the correct person, site, and procedure were performed and confirmed by me, the nursing staff, and the patient. "Time-out" conducted as per Joint Commission's Universal Protocol (UP.01.01.01). Time:  1459  Description  Narrative of Procedure:          Rationale (medical necessity): procedure needed and proper for the diagnosis and/or treatment of the patient's medical symptoms and needs. Procedural Technique Safety Precautions: Aspiration looking for blood return was conducted prior to all injections. At no point did we inject any substances, as a needle was being advanced. No attempts were made at seeking any paresthesias. Safe injection practices and needle disposal techniques used. Medications properly checked for expiration dates. SDV (single dose vial) medications used. Description of the Procedure: Protocol guidelines were followed. The patient was placed in position over the procedure table. The target area was identified and the area prepped in the usual manner. Skin & deeper tissues infiltrated with local anesthetic. Appropriate amount of time allowed to pass for local anesthetics to take effect. The procedure needles were then advanced to the target area. Proper needle placement secured. Negative aspiration confirmed. Solution injected in intermittent fashion, asking for systemic symptoms every 0.5cc of injectate. The needles were then removed and the area cleansed, making sure to leave some of the prepping solution back to take advantage of its long term bactericidal properties.             Vitals:   08/02/21 1436 08/02/21 1457  08/02/21 1505  BP: (!) 154/91 (!) 160/95 (!) 161/96  Pulse: 81 86 84  Resp: 16 17   Temp: (!) 97.5 F (36.4 C)    TempSrc: Temporal    SpO2: 98% 99% 99%  Weight: 183 lb (83 kg)    Height: '5\' 9"'$  (1.753 m)       6 cc solution made of 5 cc of 0.2% ropivacaine, 1 cc of methylprednisolone, 40 mg/cc.  Injected into the right glenohumeral joint, posterior approach after contrast confirmation  Afterwards a series of trigger point injections were done in the right trapezius, right periscapular and cervical paraspinal region with 0.5 to 1 cc of 0.2% ropivacaine injected and needling performed.  Start Time: 1459 hrs. End Time: 1503 hrs.  Antibiotic Prophylaxis:   Anti-infectives (From admission, onward)    None      Indication(s): None identified  Post-operative Assessment:  Post-procedure Vital Signs:  Pulse/HCG Rate: 84  Temp:  (!) 97.5 F (36.4 C) Resp: 17 BP:  (!) 161/96 SpO2: 99 %  EBL: None  Complications: No immediate post-treatment complications observed by team, or reported by patient.  Note: The patient tolerated the entire procedure well. A repeat set of vitals were taken after the procedure and the patient was kept under observation following institutional policy, for this type of procedure. Post-procedural neurological assessment was performed, showing return to baseline, prior to discharge. The patient was provided with post-procedure discharge instructions, including a section on how to identify potential problems. Should any problems arise concerning this procedure, the patient was given instructions to immediately contact us, at any time, without hesitation. In any case, we plan to contact the patient by telephone for a follow-up status report regarding this interventional procedure.  Comments:  No additional relevant information.  Plan of Care  Orders:  Orders Placed This Encounter  Procedures   DG PAIN CLINIC C-ARM 1-60 MIN NO REPORT    Intraoperative  interpretation by procedural physician at [facility].    Standing Status:   Standing    Number of Occurrences:   1    Order Specific Question:   Reason for exam:    Answer:   Assistance in needle guidance  and placement for procedures requiring needle placement in or near specific anatomical locations not easily accessible without such assistance.    Medications ordered for procedure: Meds ordered this encounter  Medications   iohexol (OMNIPAQUE) 180 MG/ML injection 10 mL    Must be Myelogram-compatible. If not available, you may substitute with a water-soluble, non-ionic, hypoallergenic, myelogram-compatible radiological contrast medium.   lidocaine (XYLOCAINE) 2 % (with pres) injection 400 mg   methylPREDNISolone acetate (DEPO-MEDROL) injection 40 mg   ropivacaine (PF) 2 mg/mL (0.2%) (NAROPIN) injection 4 mL   Medications administered: We administered iohexol, lidocaine, methylPREDNISolone acetate, and ropivacaine (PF) 2 mg/mL (0.2%).  See the medical record for exact dosing, route, and time of administration.  Follow-up plan:   Return in about 4 weeks (around 08/30/2021) for Post Procedure Evaluation, virtual.       Right posterior glenohumeral joint injection 10/28/2019, 05/25/20, right anterior glenohumeral joint injection 04/12/21, right knee triamcinolone ER; right posterior glenohumeral joint injection, right posterior trapezius TPI 08/02/2021    Recent Visits Date Type Provider Dept  07/20/21 Office Visit Gillis Santa, MD [facility]  05/04/21 Office Visit Gillis Santa, MD [facility]  Showing recent visits within past 90 days and meeting all other requirements Today's Visits Date Type Provider Dept  08/02/21 Procedure visit Gillis Santa, MD [facility]  Showing today's visits and meeting all other requirements Future Appointments Date Type Provider Dept  08/30/21 Appointment Gillis Santa, MD [facility]  Showing future  appointments within next 90 days and meeting all other requirements  Disposition: Discharge home  Discharge (Date  Time): 08/02/2021; 1511 hrs.   Primary Care Physician: Sandrea Hughs, NP Location: [facility] Note by: Gillis Santa, MD Date: 08/02/2021; Time: 3:17 PM  Disclaimer:  Medicine is not an Chief Strategy Officer. The only guarantee in medicine is that nothing is guaranteed. It is important to note that the decision to proceed with this intervention was based on the information collected from the patient. The Data and conclusions were drawn from the patient's questionnaire, the interview, and the physical examination. Because the information was provided in large part by the patient, it cannot be guaranteed that it has not been purposely or unconsciously manipulated. Every effort has been made to obtain as much relevant data as possible for this evaluation. It is important to note that the conclusions that lead to this procedure are derived in large part from the available data. Always take into account that the treatment will also be dependent on availability of resources and existing treatment guidelines, considered by other Pain Management Practitioners as being common knowledge and practice, at the time of the intervention. For Medico-Legal purposes, it is also important to point out that variation in procedural techniques and pharmacological choices are the acceptable norm. The indications, contraindications, technique, and results of the above procedure should only be interpreted and judged by a Board-Certified Interventional Pain Specialist with extensive familiarity and expertise in the same exact procedure and technique.

## 2021-08-04 ENCOUNTER — Telehealth: Payer: Self-pay

## 2021-08-04 NOTE — Telephone Encounter (Signed)
Post procedure phone call.  LM 

## 2021-08-12 ENCOUNTER — Other Ambulatory Visit: Payer: Self-pay | Admitting: Family

## 2021-08-12 DIAGNOSIS — J310 Chronic rhinitis: Secondary | ICD-10-CM

## 2021-08-18 ENCOUNTER — Other Ambulatory Visit: Payer: Self-pay | Admitting: Student in an Organized Health Care Education/Training Program

## 2021-08-18 DIAGNOSIS — G894 Chronic pain syndrome: Secondary | ICD-10-CM

## 2021-08-18 DIAGNOSIS — M542 Cervicalgia: Secondary | ICD-10-CM

## 2021-08-23 ENCOUNTER — Telehealth: Payer: Self-pay | Admitting: Student in an Organized Health Care Education/Training Program

## 2021-08-23 ENCOUNTER — Other Ambulatory Visit: Payer: Self-pay | Admitting: *Deleted

## 2021-08-23 DIAGNOSIS — G894 Chronic pain syndrome: Secondary | ICD-10-CM

## 2021-08-23 DIAGNOSIS — M542 Cervicalgia: Secondary | ICD-10-CM

## 2021-08-23 MED ORDER — PREGABALIN 25 MG PO CAPS: 25.0000 mg | ORAL_CAPSULE | Freq: Two times a day (BID) | ORAL | 2 refills | Status: DC

## 2021-08-23 NOTE — Telephone Encounter (Signed)
Patient needs refill on her pregablin. Can this be sent in and patient notified please

## 2021-08-23 NOTE — Telephone Encounter (Signed)
Rx request sent to Dr. Lateef 

## 2021-08-30 ENCOUNTER — Ambulatory Visit
Payer: Medicare Other | Attending: Student in an Organized Health Care Education/Training Program | Admitting: Student in an Organized Health Care Education/Training Program

## 2021-08-30 DIAGNOSIS — M12811 Other specific arthropathies, not elsewhere classified, right shoulder: Secondary | ICD-10-CM

## 2021-08-30 DIAGNOSIS — M75101 Unspecified rotator cuff tear or rupture of right shoulder, not specified as traumatic: Secondary | ICD-10-CM

## 2021-08-30 NOTE — Progress Notes (Signed)
I attempted to call the patient however no response. Voicemail left instructing patient to call front desk office at [phone] to reschedule appointment. -Dr Holley Raring   Post-procedure evaluation   Procedure: Glenohumeral Joint (shoulder) Injection #1 and right trapezius/cervical TPI Laterality: Right (-RT)  Level: Shoulder   Imaging: Fluoroscopy-guided Anesthesia: Local anesthesia (1-2% Lidocaine) DOS: 08/02/2021  Performed by: Gillis Santa, MD  Purpose: Diagnostic/Therapeutic Indications: Shoulder pain severe enough to impact quality of life or function. Rationale (medical necessity): procedure needed and proper for the diagnosis and/or treatment of Kaylee Warner's medical symptoms and needs. 1. Right rotator cuff tear arthropathy   2. Arthropathy of right shoulder   3. Cervicalgia   4. Chronic pain syndrome    NAS-11 Pain score:   Pre-procedure: 9 /10   Post-procedure: 9 /10       Effectiveness:  Initial hour after procedure: 0 %  Subsequent 4-6 hours post-procedure: 0 %  Analgesia past initial 6 hours: 50 % (right shoulder gets numb with certain ways that she moves.)  Ongoing improvement:  Analgesic:

## 2021-09-02 ENCOUNTER — Telehealth: Payer: Self-pay | Admitting: Orthopedic Surgery

## 2021-09-08 ENCOUNTER — Other Ambulatory Visit: Payer: Self-pay | Admitting: Family

## 2021-09-08 DIAGNOSIS — I10 Essential (primary) hypertension: Secondary | ICD-10-CM

## 2021-09-21 ENCOUNTER — Ambulatory Visit
Payer: Medicare Other | Attending: Student in an Organized Health Care Education/Training Program | Admitting: Student in an Organized Health Care Education/Training Program

## 2021-09-21 DIAGNOSIS — M12811 Other specific arthropathies, not elsewhere classified, right shoulder: Secondary | ICD-10-CM

## 2021-09-21 DIAGNOSIS — M75101 Unspecified rotator cuff tear or rupture of right shoulder, not specified as traumatic: Secondary | ICD-10-CM

## 2021-09-21 NOTE — Progress Notes (Signed)
I attempted to call the patient however no response. Voicemail left instructing patient to call front desk office at [phone] to reschedule appointment. -Dr Grenda Lora  

## 2021-09-21 NOTE — Telephone Encounter (Signed)
Discussed appointment at time of call; patient was to call back

## 2021-10-04 ENCOUNTER — Telehealth: Payer: Self-pay | Admitting: Orthopedic Surgery

## 2021-10-04 NOTE — Telephone Encounter (Signed)
Voice message received from patient this morning; call returned - reached voice mail - left message to call back to our office.

## 2021-10-05 NOTE — Telephone Encounter (Signed)
Bring in tomorrow for injection bu I'm only here in the am and surgery in the afternoon

## 2021-10-05 NOTE — Telephone Encounter (Signed)
Spoke with patient - states what she would like is to have an injection in her right knee by Dr Aline Brochure - by tomorrow, as she is leaving for a cruise on Thursday. States she had tried to call for the past 2 weeks. We received voice message yesterday; called patient back, left message in return.  Patient said she wants to have surgery done by Dr Aline Brochure sometime soon, but has had to see her pain management doctor in the meantime for her shoulder, who has done injection(s) in this knee. Notes are in Epic chart.  Patient was last seen here by Dr Aline Brochure in 2019; therefore, "new patient."  Please advise.

## 2021-10-06 ENCOUNTER — Encounter: Payer: Self-pay | Admitting: Orthopedic Surgery

## 2021-10-06 ENCOUNTER — Ambulatory Visit (INDEPENDENT_AMBULATORY_CARE_PROVIDER_SITE_OTHER): Payer: Medicare Other | Admitting: Orthopedic Surgery

## 2021-10-06 DIAGNOSIS — M1711 Unilateral primary osteoarthritis, right knee: Secondary | ICD-10-CM

## 2021-10-06 DIAGNOSIS — M75101 Unspecified rotator cuff tear or rupture of right shoulder, not specified as traumatic: Secondary | ICD-10-CM

## 2021-10-06 DIAGNOSIS — M12811 Other specific arthropathies, not elsewhere classified, right shoulder: Secondary | ICD-10-CM

## 2021-10-06 MED ORDER — METHYLPREDNISOLONE ACETATE 40 MG/ML IJ SUSP
40.0000 mg | Freq: Once | INTRAMUSCULAR | Status: AC
  Administered 2021-10-06: 40 mg via INTRA_ARTICULAR

## 2021-10-06 NOTE — Addendum Note (Signed)
Addended byCandice Camp on: 10/06/2021 11:43 AM   Modules accepted: Orders

## 2021-10-06 NOTE — Telephone Encounter (Signed)
Called patient - notified, scheduled.

## 2021-10-06 NOTE — Progress Notes (Addendum)
Chief Complaint  Patient presents with   Knee Pain    RT knee/wants injection only   Shoulder Pain    WANTS INJECTION   Encounter Diagnoses  Name Primary?   Primary osteoarthritis of right knee Yes   Right rotator cuff tear arthropathy     Procedure note right knee injection   verbal consent was obtained to inject right knee joint  Timeout was completed to confirm the site of injection  The medications used were depomedrol 40 mg and 1% lidocaine 3 cc Anesthesia was provided by ethyl chloride and the skin was prepped with alcohol.  After cleaning the skin with alcohol a 20-gauge needle was used to inject the right knee joint. There were no complications. A sterile bandage was applied.    Procedure note the subacromial injection shoulder RIGHT    Verbal consent was obtained to inject the  RIGHT   Shoulder  Timeout was completed to confirm the injection site is a subacromial space of the  RIGHT  shoulder   Medication used Depo-Medrol 40 mg and lidocaine 1% 3 cc  Anesthesia was provided by ethyl chloride  The injection was performed in the RIGHT  posterior subacromial space. After pinning the skin with alcohol and anesthetized the skin with ethyl chloride the subacromial space was injected using a 20-gauge needle. There were no complications  Sterile dressing was applied.

## 2021-10-07 ENCOUNTER — Encounter: Payer: Self-pay | Admitting: Gastroenterology

## 2021-10-11 ENCOUNTER — Telehealth: Payer: Self-pay

## 2021-10-11 ENCOUNTER — Encounter: Payer: Medicare Other | Admitting: Family

## 2021-10-11 NOTE — Telephone Encounter (Signed)
Late entry I called patient this morning three times 10:19am, 10:40am, and 10:51am. No answer and voicemail left each time. Patient made aware to call office and reschedule AWV.

## 2021-10-11 NOTE — Telephone Encounter (Signed)
Noted  

## 2021-10-11 NOTE — Progress Notes (Signed)
  This encounter was created in error - please disregard. No show 

## 2021-10-27 ENCOUNTER — Other Ambulatory Visit: Payer: Self-pay | Admitting: Family

## 2021-10-27 DIAGNOSIS — F321 Major depressive disorder, single episode, moderate: Secondary | ICD-10-CM

## 2021-11-05 DIAGNOSIS — Z01810 Encounter for preprocedural cardiovascular examination: Secondary | ICD-10-CM | POA: Diagnosis not present

## 2021-11-05 DIAGNOSIS — I10 Essential (primary) hypertension: Secondary | ICD-10-CM | POA: Diagnosis not present

## 2021-11-05 DIAGNOSIS — M2041 Other hammer toe(s) (acquired), right foot: Secondary | ICD-10-CM | POA: Diagnosis not present

## 2021-11-05 DIAGNOSIS — M205X1 Other deformities of toe(s) (acquired), right foot: Secondary | ICD-10-CM | POA: Diagnosis not present

## 2021-11-05 DIAGNOSIS — Z885 Allergy status to narcotic agent status: Secondary | ICD-10-CM | POA: Diagnosis not present

## 2021-11-05 DIAGNOSIS — Z7982 Long term (current) use of aspirin: Secondary | ICD-10-CM | POA: Diagnosis not present

## 2021-11-05 DIAGNOSIS — Z880 Allergy status to penicillin: Secondary | ICD-10-CM | POA: Diagnosis not present

## 2021-11-05 DIAGNOSIS — Z79899 Other long term (current) drug therapy: Secondary | ICD-10-CM | POA: Diagnosis not present

## 2021-11-09 ENCOUNTER — Other Ambulatory Visit: Payer: Self-pay | Admitting: Family

## 2021-11-09 DIAGNOSIS — M2041 Other hammer toe(s) (acquired), right foot: Secondary | ICD-10-CM | POA: Diagnosis not present

## 2021-11-09 DIAGNOSIS — Z880 Allergy status to penicillin: Secondary | ICD-10-CM | POA: Diagnosis not present

## 2021-11-09 DIAGNOSIS — Z7982 Long term (current) use of aspirin: Secondary | ICD-10-CM | POA: Diagnosis not present

## 2021-11-09 DIAGNOSIS — Z885 Allergy status to narcotic agent status: Secondary | ICD-10-CM | POA: Diagnosis not present

## 2021-11-09 DIAGNOSIS — Z79899 Other long term (current) drug therapy: Secondary | ICD-10-CM | POA: Diagnosis not present

## 2021-11-09 DIAGNOSIS — I10 Essential (primary) hypertension: Secondary | ICD-10-CM

## 2021-11-12 DIAGNOSIS — Z880 Allergy status to penicillin: Secondary | ICD-10-CM | POA: Diagnosis not present

## 2021-11-12 DIAGNOSIS — Z7982 Long term (current) use of aspirin: Secondary | ICD-10-CM | POA: Diagnosis not present

## 2021-11-12 DIAGNOSIS — M205X1 Other deformities of toe(s) (acquired), right foot: Secondary | ICD-10-CM | POA: Diagnosis not present

## 2021-11-12 DIAGNOSIS — I10 Essential (primary) hypertension: Secondary | ICD-10-CM | POA: Diagnosis not present

## 2021-11-12 DIAGNOSIS — Z885 Allergy status to narcotic agent status: Secondary | ICD-10-CM | POA: Diagnosis not present

## 2021-11-12 DIAGNOSIS — Z79899 Other long term (current) drug therapy: Secondary | ICD-10-CM | POA: Diagnosis not present

## 2021-11-12 DIAGNOSIS — M2041 Other hammer toe(s) (acquired), right foot: Secondary | ICD-10-CM | POA: Diagnosis not present

## 2021-12-09 DIAGNOSIS — M2041 Other hammer toe(s) (acquired), right foot: Secondary | ICD-10-CM | POA: Diagnosis not present

## 2021-12-09 DIAGNOSIS — Z4789 Encounter for other orthopedic aftercare: Secondary | ICD-10-CM | POA: Diagnosis not present

## 2021-12-09 DIAGNOSIS — M205X1 Other deformities of toe(s) (acquired), right foot: Secondary | ICD-10-CM | POA: Diagnosis not present

## 2021-12-09 DIAGNOSIS — Z4889 Encounter for other specified surgical aftercare: Secondary | ICD-10-CM | POA: Diagnosis not present

## 2021-12-09 DIAGNOSIS — Z981 Arthrodesis status: Secondary | ICD-10-CM | POA: Diagnosis not present

## 2021-12-16 ENCOUNTER — Other Ambulatory Visit: Payer: Self-pay | Admitting: Student in an Organized Health Care Education/Training Program

## 2021-12-16 DIAGNOSIS — G894 Chronic pain syndrome: Secondary | ICD-10-CM

## 2021-12-16 DIAGNOSIS — M5416 Radiculopathy, lumbar region: Secondary | ICD-10-CM

## 2021-12-29 NOTE — Telephone Encounter (Signed)
Attempted to call for pre appointment review of allergies/meds. Message left. 

## 2021-12-30 ENCOUNTER — Ambulatory Visit
Payer: Medicare Other | Attending: Student in an Organized Health Care Education/Training Program | Admitting: Student in an Organized Health Care Education/Training Program

## 2021-12-30 DIAGNOSIS — M5416 Radiculopathy, lumbar region: Secondary | ICD-10-CM

## 2021-12-30 DIAGNOSIS — G894 Chronic pain syndrome: Secondary | ICD-10-CM

## 2021-12-30 DIAGNOSIS — M542 Cervicalgia: Secondary | ICD-10-CM

## 2021-12-30 MED ORDER — PREGABALIN 25 MG PO CAPS: 25.0000 mg | ORAL_CAPSULE | Freq: Two times a day (BID) | ORAL | 5 refills | Status: DC

## 2021-12-30 MED ORDER — TIZANIDINE HCL 4 MG PO TABS: 4.0000 mg | ORAL_TABLET | Freq: Two times a day (BID) | ORAL | 5 refills | Status: DC | PRN

## 2021-12-30 NOTE — Progress Notes (Signed)
Attempted to call patient but go straight to voicemail.  Notes and computer indicate that patient needs a refill of tizanidine and Lyrica which I will go ahead and send in.  Requested Prescriptions   Signed Prescriptions Disp Refills   pregabalin (LYRICA) 25 MG capsule 60 capsule 5    Sig: Take 1 capsule (25 mg total) by mouth 2 (two) times daily.   tiZANidine (ZANAFLEX) 4 MG tablet 30 tablet 5    Sig: Take 1 tablet (4 mg total) by mouth 2 (two) times daily as needed.

## 2022-01-17 DIAGNOSIS — M48062 Spinal stenosis, lumbar region with neurogenic claudication: Secondary | ICD-10-CM | POA: Diagnosis not present

## 2022-01-25 ENCOUNTER — Other Ambulatory Visit: Payer: Self-pay | Admitting: Nurse Practitioner

## 2022-01-25 DIAGNOSIS — Z981 Arthrodesis status: Secondary | ICD-10-CM | POA: Diagnosis not present

## 2022-01-25 DIAGNOSIS — F321 Major depressive disorder, single episode, moderate: Secondary | ICD-10-CM

## 2022-01-25 DIAGNOSIS — M2041 Other hammer toe(s) (acquired), right foot: Secondary | ICD-10-CM | POA: Diagnosis not present

## 2022-01-25 DIAGNOSIS — Z4789 Encounter for other orthopedic aftercare: Secondary | ICD-10-CM | POA: Diagnosis not present

## 2022-01-25 DIAGNOSIS — M205X1 Other deformities of toe(s) (acquired), right foot: Secondary | ICD-10-CM | POA: Diagnosis not present

## 2022-01-25 DIAGNOSIS — Z9889 Other specified postprocedural states: Secondary | ICD-10-CM | POA: Diagnosis not present

## 2022-02-18 ENCOUNTER — Other Ambulatory Visit: Payer: Self-pay | Admitting: Family

## 2022-02-18 ENCOUNTER — Other Ambulatory Visit: Payer: Self-pay | Admitting: Nurse Practitioner

## 2022-02-18 DIAGNOSIS — I10 Essential (primary) hypertension: Secondary | ICD-10-CM

## 2022-02-18 DIAGNOSIS — F321 Major depressive disorder, single episode, moderate: Secondary | ICD-10-CM

## 2022-02-21 ENCOUNTER — Telehealth: Payer: Self-pay

## 2022-02-21 NOTE — Telephone Encounter (Signed)
Message left on clinical intake voicemail:   Patient called stating she is having issues regaining her mobility follow surgery and was wondering if Webb Silversmith could give some recommendations.  I returned called to patient and left a detailed message instructing her to call the office to schedule an appointment with either of our providers, I emphasized that we could see her as early as tomorrow

## 2022-02-22 ENCOUNTER — Encounter: Payer: Self-pay | Admitting: Adult Health

## 2022-02-22 ENCOUNTER — Ambulatory Visit (INDEPENDENT_AMBULATORY_CARE_PROVIDER_SITE_OTHER): Payer: Medicare Other | Admitting: Adult Health

## 2022-02-22 VITALS — BP 127/78 | HR 78 | Temp 96.7°F | Resp 18 | Ht 69.0 in | Wt 174.0 lb

## 2022-02-22 DIAGNOSIS — M1711 Unilateral primary osteoarthritis, right knee: Secondary | ICD-10-CM | POA: Diagnosis not present

## 2022-02-22 DIAGNOSIS — I10 Essential (primary) hypertension: Secondary | ICD-10-CM | POA: Diagnosis not present

## 2022-02-22 DIAGNOSIS — R2681 Unsteadiness on feet: Secondary | ICD-10-CM

## 2022-02-22 DIAGNOSIS — D649 Anemia, unspecified: Secondary | ICD-10-CM

## 2022-02-22 DIAGNOSIS — F321 Major depressive disorder, single episode, moderate: Secondary | ICD-10-CM | POA: Diagnosis not present

## 2022-02-22 MED ORDER — AMLODIPINE BESYLATE 10 MG PO TABS: 10.0000 mg | ORAL_TABLET | Freq: Every day | ORAL | 3 refills | Status: DC

## 2022-02-22 MED ORDER — VITAMIN D3 125 MCG (5000 UT) PO CAPS: 5000.0000 [IU] | ORAL_CAPSULE | Freq: Every morning | ORAL | 0 refills | Status: DC

## 2022-02-22 MED ORDER — SERTRALINE HCL 25 MG PO TABS: 50.0000 mg | ORAL_TABLET | Freq: Every day | ORAL | 1 refills | Status: DC

## 2022-02-22 MED ORDER — DICLOFENAC SODIUM 1 % EX GEL: CUTANEOUS | 5 refills | Status: DC

## 2022-02-22 NOTE — Progress Notes (Signed)
[facility] clinic   Provider:  Durenda Age DNP  Code Status:  Full Code  Goals of Care:     07/20/2021    8:31 AM  Advanced Directives  Does Patient Have a Medical Advance Directive? Yes  Type of Advance Directive Healthcare Power of Attorney     Chief Complaint  Patient presents with   Acute Visit    Mobility concerns and having Nasal congestion. Patient states that she has been feeling stiff in the joints  and has not taking  medication for  the nasal congestion.     HPI: Patient is a 77 y.o. female seen today for an acute visit for mobility concerns. She  Past Medical History:  Diagnosis Date   Allergy    Anemia    Arthritis    Hypertension     Past Surgical History:  Procedure Laterality Date   COLONOSCOPY     SARK 2009     Dr. Jolee Ewing     Allergies  Allergen Reactions   Celecoxib     REACTION: blood pressure went up   Codeine Itching   Crestor [Rosuvastatin Calcium]     Muscle cramping and achy   Fexofenadine Other (See Comments)    unknown   Penicillins Rash    Outpatient Encounter Medications as of 02/22/2022  Medication Sig   amLODipine (NORVASC) 10 MG tablet Take 1 tablet (10 mg total) by mouth daily.   Apoaequorin (PREVAGEN) 10 MG CAPS Take 1 capsule by mouth daily.   aspirin EC 81 MG tablet Take 1 tablet (81 mg total) by mouth daily.   aspirin EC 81 MG tablet Take 81 mg by mouth daily.   atorvastatin (LIPITOR) 10 MG tablet Take 1 tablet (10 mg total) by mouth 3 (three) times a week.   cetirizine (ZYRTEC) 10 MG tablet Take 1 tablet by mouth once daily   diclofenac Sodium (VOLTAREN) 1 % GEL Apply topically to right knee 4X/day as needed for pain   Docusate Sodium (DSS) 100 MG CAPS Take 100 mg by mouth daily.   donepezil (ARICEPT) 10 MG tablet TAKE 1 TABLET BY MOUTH AT BEDTIME   fluticasone (FLONASE) 50 MCG/ACT nasal spray Use 2 spray(s) in each nostril once daily   lisinopril (ZESTRIL) 40 MG tablet Take 1 tablet by mouth once daily    pregabalin (LYRICA) 25 MG capsule Take 1 capsule (25 mg total) by mouth 2 (two) times daily.   sertraline (ZOLOFT) 25 MG tablet Take 2 tablets (50 mg total) by mouth daily.   tiZANidine (ZANAFLEX) 4 MG tablet Take 1 tablet (4 mg total) by mouth 2 (two) times daily as needed.   [DISCONTINUED] Cholecalciferol (VITAMIN D3) 125 MCG (5000 UT) CAPS Take 1 capsule (5,000 Units total) by mouth every morning.   Cholecalciferol (VITAMIN D3) 125 MCG (5000 UT) capsule Take 1 capsule (5,000 Units total) by mouth every morning.   [DISCONTINUED] amLODipine (NORVASC) 10 MG tablet Take 1 tablet by mouth once daily   [DISCONTINUED] sertraline (ZOLOFT) 25 MG tablet Take 2 tablets by mouth once daily   No facility-administered encounter medications on file as of 02/22/2022.    Review of Systems:  Review of Systems  Constitutional:  Negative for appetite change, chills, fatigue and fever.  HENT:  Negative for congestion, hearing loss, rhinorrhea and sore throat.   Eyes: Negative.   Respiratory:  Negative for cough, shortness of breath and wheezing.   Cardiovascular:  Negative for chest pain, palpitations and leg swelling.  Gastrointestinal:  Negative for abdominal pain, constipation, diarrhea, nausea and vomiting.  Genitourinary:  Negative for dysuria.  Musculoskeletal:  Negative for arthralgias, back pain and myalgias.  Skin:  Negative for color change, rash and wound.  Neurological:  Negative for dizziness, weakness and headaches.  Psychiatric/Behavioral:  Negative for behavioral problems. The patient is not nervous/anxious.     Health Maintenance  Topic Date Due   Zoster Vaccines- Shingrix (1 of 2) Never done   Medicare Annual Wellness (AWV)  07/15/2021   INFLUENZA VACCINE  08/10/2021   COVID-19 Vaccine (5 - 2023-24 season) 09/10/2021   COLONOSCOPY (Pts 45-65yr Insurance coverage will need to be confirmed)  10/17/2021   DTaP/Tdap/Td (4 - Td or Tdap) 01/18/2027   Pneumonia Vaccine 77 Years old   Completed   DEXA SCAN  Completed   Hepatitis C Screening  Completed   HPV VACCINES  Aged Out    Physical Exam: Vitals:   02/22/22 1244  BP: 127/78  Pulse: 78  Resp: 18  Temp: (!) 96.7 F (35.9 C)  SpO2: 95%  Weight: 174 lb (78.9 kg)  Height: 5' 9"$  (1.753 m)   Body mass index is 25.7 kg/m. Physical Exam Constitutional:      Appearance: Normal appearance.  HENT:     Head: Normocephalic and atraumatic.     Nose: Nose normal.     Mouth/Throat:     Mouth: Mucous membranes are moist.  Eyes:     Conjunctiva/sclera: Conjunctivae normal.  Cardiovascular:     Rate and Rhythm: Normal rate and regular rhythm.  Pulmonary:     Effort: Pulmonary effort is normal.     Breath sounds: Normal breath sounds.  Abdominal:     General: Bowel sounds are normal.     Palpations: Abdomen is soft.  Musculoskeletal:        General: Normal range of motion.     Cervical back: Normal range of motion.  Skin:    General: Skin is warm and dry.  Neurological:     General: No focal deficit present.     Mental Status: She is alert and oriented to person, place, and time.  Psychiatric:        Mood and Affect: Mood normal.        Behavior: Behavior normal.        Thought Content: Thought content normal.        Judgment: Judgment normal.     Labs reviewed: Basic Metabolic Panel: Recent Labs    07/19/21 1116  NA 143  K 4.4  CL 108  CO2 24  GLUCOSE 94  BUN 15  CREATININE 0.78  CALCIUM 9.6  TSH 1.00   Liver Function Tests: Recent Labs    07/19/21 1116  AST 14  ALT 12  BILITOT 0.6  PROT 6.7   No results for input(s): "LIPASE", "AMYLASE" in the last 8760 hours. No results for input(s): "AMMONIA" in the last 8760 hours. CBC: Recent Labs    07/19/21 1116  WBC 5.5  NEUTROABS 3,493  HGB 11.7  HCT 37.8  MCV 78.1*  PLT 363   Lipid Panel: Recent Labs    07/19/21 1116  CHOL 215*  HDL 52  LDLCALC 142*  TRIG 101  CHOLHDL 4.1   Lab Results  Component Value Date   HGBA1C  5.9 (H) 07/19/2021    Procedures since last visit: No results found.  Assessment/Plan There are no diagnoses linked to this encounter.   Labs/tests ordered:  * No order type specified *  Next appt:  Visit date not found

## 2022-02-22 NOTE — Patient Instructions (Signed)
Osteoarthritis  Osteoarthritis is a type of arthritis. It refers to joint pain or joint disease. Osteoarthritis affects tissue that covers the ends of bones in joints (cartilage). Cartilage acts as a cushion between the bones and helps them move smoothly. Osteoarthritis occurs when cartilage in the joints gets worn down. Osteoarthritis is sometimes called "wear and tear" arthritis. Osteoarthritis is the most common form of arthritis. It often occurs in older people. It is a condition that gets worse over time. The joints most often affected by this condition are in the fingers, toes, hips, knees, and spine, including the neck and lower back. What are the causes? This condition is caused by the wearing down of cartilage that covers the ends of bones. What increases the risk? The following factors may make you more likely to develop this condition: Being age 50 or older. Obesity. Overuse of joints. Past injury of a joint. Past surgery on a joint. Family history of osteoarthritis. What are the signs or symptoms? The main symptoms of this condition are pain, swelling, and stiffness in the joint. Other symptoms may include: An enlarged joint. More pain and further damage caused by small pieces of bone or cartilage that break off and float inside of the joint. Small deposits of bone (osteophytes) that grow on the edges of the joint. A grating or scraping feeling inside the joint when you move it. Popping or creaking sounds when you move. Difficulty walking or exercising. An inability to grip items, twist your hand(s), or control the movements of your hands and fingers. How is this diagnosed? This condition may be diagnosed based on: Your medical history. A physical exam. Your symptoms. X-rays of the affected joint(s). Blood tests to rule out other types of arthritis. How is this treated? There is no cure for this condition, but treatment can help control pain and improve joint function.  Treatment may include a combination of therapies, such as: Pain relief techniques, such as: Applying heat and cold to the joint. Massage. A form of talk therapy called cognitive behavioral therapy (CBT). This therapy helps you set goals and follow up on the changes that you make. Medicines for pain and inflammation. The medicines can be taken by mouth or applied to the skin. They include: NSAIDs, such as ibuprofen. Prescription medicines. Strong anti-inflammatory medicines (corticosteroids). Certain nutritional supplements. A prescribed exercise program. You may work with a physical therapist. Assistive devices, such as a brace, wrap, splint, specialized glove, or cane. A weight control plan. Surgery, such as: An osteotomy. This is done to reposition the bones and relieve pain or to remove loose pieces of bone and cartilage. Joint replacement surgery. You may need this surgery if you have advanced osteoarthritis. Follow these instructions at home: Activity Rest your affected joints as told by your health care provider. Exercise as told by your health care provider. He or she may recommend specific types of exercise, such as: Strengthening exercises. These are done to strengthen the muscles that support joints affected by arthritis. Aerobic activities. These are exercises, such as brisk walking or water aerobics, that increase your heart rate. Range-of-motion activities. These help your joints move more easily. Balance and agility exercises. Managing pain, stiffness, and swelling     If directed, apply heat to the affected area as often as told by your health care provider. Use the heat source that your health care provider recommends, such as a moist heat pack or a heating pad. If you have a removable assistive device, remove it   as told by your health care provider. Place a towel between your skin and the heat source. If your health care provider tells you to keep the assistive device  on while you apply heat, place a towel between the assistive device and the heat source. Leave the heat on for 20-30 minutes. Remove the heat if your skin turns bright red. This is especially important if you are unable to feel pain, heat, or cold. You may have a greater risk of getting burned. If directed, put ice on the affected area. To do this: If you have a removable assistive device, remove it as told by your health care provider. Put ice in a plastic bag. Place a towel between your skin and the bag. If your health care provider tells you to keep the assistive device on during icing, place a towel between the assistive device and the bag. Leave the ice on for 20 minutes, 2-3 times a day. Move your fingers or toes often to reduce stiffness and swelling. Raise (elevate) the injured area above the level of your heart while you are sitting or lying down. General instructions Take over-the-counter and prescription medicines only as told by your health care provider. Maintain a healthy weight. Follow instructions from your health care provider for weight control. Do not use any products that contain nicotine or tobacco, such as cigarettes, e-cigarettes, and chewing tobacco. If you need help quitting, ask your health care provider. Use assistive devices as told by your health care provider. Keep all follow-up visits as told by your health care provider. This is important. Where to find more information [employer] of Arthritis and Musculoskeletal and Skin Diseases: www.niams.SouthExposed.es [employer] on Aging: http://kim-miller.com/ American College of Rheumatology: www.rheumatology.org Contact a health care provider if: You have redness, swelling, or a feeling of warmth in a joint that gets worse. You have a fever along with joint or muscle aches. You develop a rash. You have trouble doing your normal activities. Get help right away if: You have pain that gets worse and is not relieved by  pain medicine. Summary Osteoarthritis is a type of arthritis that affects tissue covering the ends of bones in joints (cartilage). This condition is caused by the wearing down of cartilage that covers the ends of bones. The main symptom of this condition is pain, swelling, and stiffness in the joint. There is no cure for this condition, but treatment can help control pain and improve joint function. This information is not intended to replace advice given to you by your health care provider. Make sure you discuss any questions you have with your health care provider. Document Revised: 06/29/2021 Document Reviewed: 12/24/2018 Elsevier Patient Education  [employer].

## 2022-02-23 LAB — CBC WITH DIFFERENTIAL/PLATELET
Absolute Monocytes: 481 cells/uL (ref 200–950)
Basophils Absolute: 37 cells/uL (ref 0–200)
Basophils Relative: 0.5 %
Eosinophils Absolute: 133 cells/uL (ref 15–500)
Eosinophils Relative: 1.8 %
HCT: 37.7 % (ref 35.0–45.0)
Hemoglobin: 11.8 g/dL (ref 11.7–15.5)
Lymphs Abs: 1717 cells/uL (ref 850–3900)
MCH: 23.8 pg — ABNORMAL LOW (ref 27.0–33.0)
MCHC: 31.3 g/dL — ABNORMAL LOW (ref 32.0–36.0)
MCV: 76.2 fL — ABNORMAL LOW (ref 80.0–100.0)
MPV: 10.6 fL (ref 7.5–12.5)
Monocytes Relative: 6.5 %
Neutro Abs: 5032 cells/uL (ref 1500–7800)
Neutrophils Relative %: 68 %
Platelets: 375 10*3/uL (ref 140–400)
RBC: 4.95 10*6/uL (ref 3.80–5.10)
RDW: 15.8 % — ABNORMAL HIGH (ref 11.0–15.0)
Total Lymphocyte: 23.2 %
WBC: 7.4 10*3/uL (ref 3.8–10.8)

## 2022-02-23 LAB — BASIC METABOLIC PANEL WITH GFR
BUN: 11 mg/dL (ref 7–25)
CO2: 25 mmol/L (ref 20–32)
Calcium: 9.7 mg/dL (ref 8.6–10.4)
Chloride: 106 mmol/L (ref 98–110)
Creat: 0.81 mg/dL (ref 0.60–1.00)
Glucose, Bld: 74 mg/dL (ref 65–139)
Potassium: 4.1 mmol/L (ref 3.5–5.3)
Sodium: 142 mmol/L (ref 135–146)
eGFR: 75 mL/min/{1.73_m2} (ref >=60)

## 2022-02-23 NOTE — Progress Notes (Signed)
CBC and BMP all normal

## 2022-04-19 ENCOUNTER — Other Ambulatory Visit: Payer: Self-pay | Admitting: Family

## 2022-04-19 DIAGNOSIS — I10 Essential (primary) hypertension: Secondary | ICD-10-CM

## 2022-04-20 DIAGNOSIS — M7751 Other enthesopathy of right foot: Secondary | ICD-10-CM | POA: Diagnosis not present

## 2022-04-20 DIAGNOSIS — Z981 Arthrodesis status: Secondary | ICD-10-CM | POA: Diagnosis not present

## 2022-05-25 ENCOUNTER — Ambulatory Visit: Payer: Medicare Other | Admitting: Family

## 2022-06-30 ENCOUNTER — Other Ambulatory Visit: Payer: Self-pay | Admitting: Family

## 2022-06-30 DIAGNOSIS — R413 Other amnesia: Secondary | ICD-10-CM

## 2022-07-07 ENCOUNTER — Other Ambulatory Visit: Payer: Self-pay | Admitting: Family

## 2022-07-07 DIAGNOSIS — J310 Chronic rhinitis: Secondary | ICD-10-CM

## 2022-07-08 ENCOUNTER — Telehealth: Payer: Self-pay

## 2022-07-08 NOTE — Telephone Encounter (Signed)
Patient left message on voicemail wanting to schedule an appointment.  I called her back and had to leave message for her to call the office so we can get her schedule.

## 2022-07-25 ENCOUNTER — Telehealth: Payer: Self-pay | Admitting: Student in an Organized Health Care Education/Training Program

## 2022-07-25 DIAGNOSIS — M1711 Unilateral primary osteoarthritis, right knee: Secondary | ICD-10-CM

## 2022-07-25 NOTE — Telephone Encounter (Signed)
PT called stated that she is having pain in her right knee, wants to see if Dr. Cherylann Ratel will do another injection in her knee. PT has been see for this same area before. I didn't see an prn order in place. Please give patient a call. Let me know I will get her schedule to come in. TY

## 2022-08-08 ENCOUNTER — Ambulatory Visit: Payer: Medicare Other | Admitting: Student in an Organized Health Care Education/Training Program

## 2022-08-17 ENCOUNTER — Encounter: Payer: Self-pay | Admitting: Student in an Organized Health Care Education/Training Program

## 2022-08-17 ENCOUNTER — Ambulatory Visit
Payer: Medicare Other | Attending: Student in an Organized Health Care Education/Training Program | Admitting: Student in an Organized Health Care Education/Training Program

## 2022-08-17 VITALS — BP 138/86 | HR 100 | Temp 97.3°F | Resp 16 | Ht 69.0 in | Wt 172.0 lb

## 2022-08-17 DIAGNOSIS — M5416 Radiculopathy, lumbar region: Secondary | ICD-10-CM | POA: Diagnosis not present

## 2022-08-17 DIAGNOSIS — M1711 Unilateral primary osteoarthritis, right knee: Secondary | ICD-10-CM | POA: Diagnosis not present

## 2022-08-17 DIAGNOSIS — G894 Chronic pain syndrome: Secondary | ICD-10-CM | POA: Insufficient documentation

## 2022-08-17 DIAGNOSIS — M542 Cervicalgia: Secondary | ICD-10-CM | POA: Insufficient documentation

## 2022-08-17 MED ORDER — TRIAMCINOLONE ACETONIDE 32 MG IX SRER
32.0000 mg | Freq: Once | INTRA_ARTICULAR | Status: AC
  Administered 2022-08-17: 32 mg via INTRA_ARTICULAR
  Filled 2022-08-17: qty 5

## 2022-08-17 MED ORDER — LIDOCAINE HCL (PF) 2 % IJ SOLN
5.0000 mL | Freq: Once | INTRAMUSCULAR | Status: AC
  Administered 2022-08-17: 5 mL
  Filled 2022-08-17: qty 5

## 2022-08-17 MED ORDER — PREGABALIN 25 MG PO CAPS
25.0000 mg | ORAL_CAPSULE | Freq: Two times a day (BID) | ORAL | 5 refills | Status: DC
Start: 2022-08-17 — End: 2023-03-02

## 2022-08-17 MED ORDER — TIZANIDINE HCL 4 MG PO TABS
4.0000 mg | ORAL_TABLET | Freq: Two times a day (BID) | ORAL | 5 refills | Status: DC | PRN
Start: 2022-08-17 — End: 2023-07-26

## 2022-08-17 NOTE — Patient Instructions (Signed)

## 2022-08-17 NOTE — Progress Notes (Signed)
Safety precautions to be maintained throughout the outpatient stay will include: orient to surroundings, keep bed in low position, maintain call bell within reach at all times, provide assistance with transfer out of bed and ambulation.  

## 2022-08-17 NOTE — Progress Notes (Signed)
PROVIDER NOTE: Interpretation of information contained herein should be left to medically-trained personnel. Specific patient instructions are provided elsewhere under "Patient Instructions" section of medical record. This document was created in part using STT-dictation technology, any transcriptional errors that may result from this process are unintentional.  Patient: Kaylee Warner Type: Established DOB: [birth year] MRN: [id number] PCP: Caesar Bookman, NP  Service: Procedure DOS: 08/17/2022 Setting: Ambulatory Location: Ambulatory outpatient facility Delivery: Face-to-face Provider: Edward Jolly, MD Specialty: Interventional Pain Management Specialty designation: 09 Location: Outpatient facility Ref. Prov.: Ngetich, Donalee Citrin, NP    Primary Reason for Visit: Interventional Pain Management Treatment. CC: Knee Pain   Procedure:           Type: Steroid Intra-articular Knee Injection (ER Triamcinolone) Laterality: Right (-RT) Level/approach: Medial Imaging guidance: None required (CPT-20610) Anesthesia: Local anesthesia (1-2% Lidocaine) Anxiolysis: None                 Sedation: None.  Purpose: Diagnostic/Therapeutic Indications: Knee arthralgia associated to osteoarthritis of the knee 1. Primary osteoarthritis of right knee   2. Chronic pain syndrome   3. Lumbar radiculopathy   4. Cervicalgia    NAS-11 score:   Pre-procedure: 10-Worst pain ever/10   Post-procedure: 4  (standing,walking)/10     Pre-Procedure Preparation  Monitoring: As per clinic protocol.  Risk Assessment: Vitals:  UVO:[id number] body mass index is 25.4 kg/m as calculated from the following:   Height as of this encounter: 5\' 9"  (1.753 m).   Weight as of this encounter: 172 lb (78 kg)., Rate:100 , BP:138/86, Resp:16, Temp:(!) 97.3 F (36.3 C), SpO2:100 %  Allergies: She is allergic to celecoxib, codeine, crestor [rosuvastatin calcium], fexofenadine, and penicillins.  Precautions: No additional precautions  required  Blood-thinner(s): None at this time  Coagulopathies: Reviewed. None identified.   Active Infection(s): Reviewed. None identified. Kaylee Warner is afebrile   Location setting: Exam room Position: Sitting w/ knee bent 90 degrees Safety Precautions: Patient was assessed for positional comfort and pressure points before starting the procedure. Prepping solution: DuraPrep (Iodine Povacrylex [0.7% available iodine] and Isopropyl Alcohol, 74% w/w) Prep Area: Entire knee region Approach: percutaneous, just above the tibial plateau, medial to the infrapatellar tendon. Intended target: Intra-articular knee space Materials: Tray: Block Needle(s): Regular Qty: 1/side Length: 1.5-inch Gauge: 25G  5 cc solution of triamcinolone 32 mg was injected into the right knee.    Meds ordered this encounter  Medications   Triamcinolone Acetonide (ZILRETTA) intra-articular injection 32 mg    Maintain refrigerated.  Prepared suspension may be stored up to 4 hours at ambient conditions.   lidocaine HCl (PF) (XYLOCAINE) 2 % injection 5 mL   tiZANidine (ZANAFLEX) 4 MG tablet    Sig: Take 1 tablet (4 mg total) by mouth 2 (two) times daily as needed.    Dispense:  30 tablet    Refill:  5   pregabalin (LYRICA) 25 MG capsule    Sig: Take 1 capsule (25 mg total) by mouth 2 (two) times daily.    Dispense:  60 capsule    Refill:  5    No orders of the defined types were placed in this encounter.    Time-out: 1342 I initiated and conducted the "Time-out" before starting the procedure, as per protocol. The patient was asked to participate by confirming the accuracy of the "Time Out" information. Verification of the correct person, site, and procedure were performed and confirmed by me, the nursing staff, and the patient. "Time-out" conducted as per Joint  Commission's Universal Protocol (UP.01.01.01). Procedure checklist: Completed   H&P (Pre-op  Assessment)  Kaylee Warner is a 77 y.o. (year old), female  patient, seen today for interventional treatment. She  has a past surgical history that includes SARK 2009 and Colonoscopy. Kaylee Warner has a current medication list which includes the following prescription(s): amlodipine, prevagen, aspirin ec, aspirin ec, atorvastatin, cetirizine, vitamin d3, diclofenac sodium, dss, donepezil, fluticasone, lisinopril, sertraline, pregabalin, and tizanidine. Her primarily concern today is the Knee Pain  She is allergic to celecoxib, codeine, crestor [rosuvastatin calcium], fexofenadine, and penicillins.   Last encounter: My last encounter with her was on 04/06/2021. Pertinent problems: Kaylee Warner has Rotator cuff syndrome of right shoulder; Chronic pain syndrome; Depression; Right rotator cuff tear arthropathy; Spinal stenosis, lumbar region, with neurogenic claudication; Lumbar facet arthropathy; Lumbar degenerative disc disease; and Chronic radicular lumbar pain on their pertinent problem list. Pain Assessment: Severity of Chronic pain is reported as a 10-Worst pain ever/10. Location: Knee Right/around the knee. Onset: 1 to 4 weeks ago. Quality: Aching, Constant, Discomfort. Timing: Constant. Modifying factor(s): ice, meds. Vitals:  height is 5\' 9"  (1.753 m) and weight is 172 lb (78 kg). Her temperature is 97.3 F (36.3 C) (abnormal). Her blood pressure is 138/86 and her pulse is 100. Her respiration is 16 and oxygen saturation is 100%.   Reason for encounter: "interventional pain management therapy due pain of at least four (4) weeks in duration, with failure to respond and/or inability to tolerate more conservative care.   Site Confirmation: Kaylee Warner was asked to confirm the procedure and laterality before marking the site.  Consent: Before the procedure and under the influence of no sedative(s), amnesic(s), or anxiolytics, the patient was informed of the treatment options, risks and possible complications. To fulfill our ethical and legal obligations, as recommended  by the American Medical Association's Code of Ethics, I have informed the patient of my clinical impression; the nature and purpose of the treatment or procedure; the risks, benefits, and possible complications of the intervention; the alternatives, including doing nothing; the risk(s) and benefit(s) of the alternative treatment(s) or procedure(s); and the risk(s) and benefit(s) of doing nothing. The patient was provided information about the general risks and possible complications associated with the procedure. These may include, but are not limited to: failure to achieve desired goals, infection, bleeding, organ or nerve damage, allergic reactions, paralysis, and death. In addition, the patient was informed of those risks and complications associated to Spine-related procedures, such as failure to decrease pain; infection (i.e.: Meningitis, epidural or intraspinal abscess); bleeding (i.e.: epidural hematoma, subarachnoid hemorrhage, or any other type of intraspinal or peri-dural bleeding); organ or nerve damage (i.e.: Any type of peripheral nerve, nerve root, or spinal cord injury) with subsequent damage to sensory, motor, and/or autonomic systems, resulting in permanent pain, numbness, and/or weakness of one or several areas of the body; allergic reactions; (i.e.: anaphylactic reaction); and/or death. Furthermore, the patient was informed of those risks and complications associated with the medications. These include, but are not limited to: allergic reactions (i.e.: anaphylactic or anaphylactoid reaction(s)); adrenal axis suppression; blood sugar elevation that in diabetics may result in ketoacidosis or comma; water retention that in patients with history of congestive heart failure may result in shortness of breath, pulmonary edema, and decompensation with resultant heart failure; weight gain; swelling or edema; medication-induced neural toxicity; particulate matter embolism and blood vessel occlusion with  resultant organ, and/or nervous system infarction; and/or aseptic necrosis of one or more joints.  Finally, the patient was informed that Medicine is not an exact science; therefore, there is also the possibility of unforeseen or unpredictable risks and/or possible complications that may result in a catastrophic outcome. The patient indicated having understood very clearly. We have given the patient no guarantees and we have made no promises. Enough time was given to the patient to ask questions, all of which were answered to the patient's satisfaction. Ms. Glockner has indicated that she wanted to continue with the procedure. Attestation: I, the ordering provider, attest that I have discussed with the patient the benefits, risks, side-effects, alternatives, likelihood of achieving goals, and potential problems during recovery for the procedure that I have provided informed consent.  Date  Time: 08/17/2022 12:40 PM    Description of procedure   Start Time: 1342 hrs  Local Anesthesia: Once the patient was positioned, prepped, and time-out was completed. The target area was identified located. The skin was marked with an approved surgical skin marker. Once marked, the skin (epidermis, dermis, and hypodermis), and deeper tissues (fat, connective tissue and muscle) were infiltrated with a small amount of a short-acting local anesthetic, loaded on a 10cc syringe with a 25G, 1.5-in  Needle. An appropriate amount of time was allowed for local anesthetics to take effect before proceeding to the next step. Local Anesthetic: Lidocaine 1-2% The unused portion of the local anesthetic was discarded in the proper designated containers. Safety Precautions: Aspiration looking for blood return was conducted prior to all injections. At no point did I inject any substances, as a needle was being advanced. Before injecting, the patient was told to immediately notify me if she was experiencing any new onset of "ringing in the  ears, or metallic taste in the mouth". No attempts were made at seeking any paresthesias. Safe injection practices and needle disposal techniques used. Medications properly checked for expiration dates. SDV (single dose vial) medications used. After the completion of the procedure, all disposable equipment used was discarded in the proper designated medical waste containers.  Technical description: Protocol guidelines were followed. After positioning, the target area was identified and prepped in the usual manner. Skin & deeper tissues infiltrated with local anesthetic. Appropriate amount of time allowed to pass for local anesthetics to take effect. Proper needle placement secured. Once satisfactory needle placement was confirmed, I proceeded to inject the desired solution in slow, incremental fashion, intermittently assessing for discomfort or any signs of abnormal or undesired spread of substance. Once completed, the needle was removed and disposed of, as per hospital protocols. The area was cleaned, making sure to leave some of the prepping solution back to take advantage of its long term bactericidal properties.  Aspiration:  Negative   Vitals:   08/17/22 1251  BP: 138/86  Pulse: 100  Resp: 16  Temp: (!) 97.3 F (36.3 C)  SpO2: 100%  Weight: 172 lb (78 kg)  Height: 5\' 9"  (1.753 m)    End Time: 1345 hrs   Imaging guidance  Imaging-assisted Technique: None required. Indication(s): N/A Exposure Time: N/A Contrast: None Fluoroscopic Guidance: N/A Ultrasound Guidance: N/A Interpretation: N/A   Post-op assessment  Post-procedure Vital Signs:  Pulse/HCG Rate: 100  Temp: (!) 97.3 F (36.3 C) Resp: 16 BP: 138/86 SpO2: 100 %  EBL: None  Complications: No immediate post-treatment complications observed by team, or reported by patient.  Note: The patient tolerated the entire procedure well. A repeat set of vitals were taken after the procedure and the patient was kept under  observation  following institutional policy, for this type of procedure. Post-procedural neurological assessment was performed, showing return to baseline, prior to discharge. The patient was provided with post-procedure discharge instructions, including a section on how to identify potential problems. Should any problems arise concerning this procedure, the patient was given instructions to immediately contact us, at any time, without hesitation. In any case, we plan to contact the patient by telephone for a follow-up status report regarding this interventional procedure.  Comments:  No additional relevant information.   Plan of care    Medications administered: We administered Triamcinolone Acetonide and lidocaine HCl (PF).  Follow-up plan:   Return in about 5 weeks (around 09/21/2022) for Post Procedure Evaluation, in person.     Recent Visits No visits were found meeting these conditions. Showing recent visits within past 90 days and meeting all other requirements Today's Visits Date Type Provider Dept  08/17/22 Procedure visit Edward Jolly, MD [facility]  Showing today's visits and meeting all other requirements Future Appointments Date Type Provider Dept  09/21/22 Appointment Edward Jolly, MD [facility]  Showing future appointments within next 90 days and meeting all other requirements   Disposition: Discharge home  Discharge (Date  Time): 08/17/2022; 1348 hrs.   Primary Care Physician: Caesar Bookman, NP Location: [facility] Note by: Edward Jolly, MD Date: 08/17/2022; Time: 1:50 PM  DISCLAIMER: Medicine is not an exact science. It has no guarantees or warranties. The decision to proceed with this intervention was based on the information collected from the patient. Conclusions were drawn from the patient's questionnaire, interview, and examination. Because information was provided in large part by the patient, it cannot be  guaranteed that it has not been purposely or unconsciously manipulated or altered. Every effort has been made to obtain as much accurate, relevant, available data as possible. Always take into account that the treatment will also be dependent on availability of resources and existing treatment guidelines, considered by other Pain Management Specialists as being common knowledge and practice, at the time of the intervention. It is also important to point out that variation in procedural techniques and pharmacological choices are the acceptable norm. For Medico-Legal review purposes, the indications, contraindications, technique, and results of the these procedures should only be evaluated, judged and interpreted by a Board-Certified Interventional Pain Specialist with extensive familiarity and expertise in the same exact procedure and technique.

## 2022-08-18 ENCOUNTER — Telehealth (INDEPENDENT_AMBULATORY_CARE_PROVIDER_SITE_OTHER): Payer: Medicare Other | Admitting: Family

## 2022-08-18 ENCOUNTER — Telehealth: Payer: Self-pay

## 2022-08-18 ENCOUNTER — Encounter: Payer: Self-pay | Admitting: Family

## 2022-08-18 DIAGNOSIS — J069 Acute upper respiratory infection, unspecified: Secondary | ICD-10-CM | POA: Diagnosis not present

## 2022-08-18 DIAGNOSIS — U071 COVID-19: Secondary | ICD-10-CM

## 2022-08-18 MED ORDER — ASCORBIC ACID 250 MG PO TABS
250.0000 mg | ORAL_TABLET | Freq: Two times a day (BID) | ORAL | 0 refills | Status: AC
Start: 2022-08-18 — End: 2022-09-01

## 2022-08-18 MED ORDER — NIRMATRELVIR/RITONAVIR (PAXLOVID)TABLET
3.0000 | ORAL_TABLET | Freq: Two times a day (BID) | ORAL | 0 refills | Status: AC
Start: 2022-08-18 — End: 2022-08-23

## 2022-08-18 MED ORDER — ZINC GLUCONATE 50 MG PO TABS
50.0000 mg | ORAL_TABLET | Freq: Every day | ORAL | 0 refills | Status: AC
Start: 2022-08-18 — End: 2022-09-01

## 2022-08-18 MED ORDER — GUAIFENESIN ER 600 MG PO TB12
600.0000 mg | ORAL_TABLET | Freq: Two times a day (BID) | ORAL | 0 refills | Status: AC
Start: 2022-08-18 — End: 2022-09-01

## 2022-08-18 NOTE — Progress Notes (Signed)
This service is provided via telemedicine  No vital signs collected/recorded due to the encounter was a telemedicine visit.   Location of patient (ex: home, work):  home  Patient consents to a telephone visit:  yes  Location of the provider (ex: office, home):  [facility]  Names of all persons participating in the telemedicine service and their role in the encounter:  Kitty, Guss Bunde CCMA,CMAA, Richarda Blade, NP   Time spent on call:  5 min   Location:      Place of Service:    Provider:   FNP-C  , Donalee Citrin, NP  Patient Care Team: , Donalee Citrin, NP as PCP - General (Family Medicine)  Extended Emergency Contact Information Primary Emergency Contact: Metzer,Roderick  [place] Phone: [phone] Relation: Son Secondary Emergency Contact: Thaker,James R Address: [address]          [place], [place] [address] [place] Home Phone: [phone] Relation: Spouse  Code Status:  Full Code  Goals of care: Advanced Directive information    08/17/2022   12:56 PM  Advanced Directives  Does Patient Have a Medical Advance Directive? Yes     Chief Complaint  Patient presents with   Acute Visit    Patient is covid positive 08/18/2022 Symptoms stared 07/16/2022    HPI:  Pt is a 77 y.o. female seen today for an acute visit for evaluation of COVID-19 positive test 08/18/2022.complains of cough ,runny nose,fatigue and no appetite. States went to the beach with the grandchildren then went out to eat. Started with sneezing.Has taken vitamin C and ginger. She denies any fever,chills,body aches,runny nose,chest tightness,chest pain,palpitation or shortness of breath.    Past Medical History:  Diagnosis Date   Allergy    Anemia    Arthritis    Hypertension    Past Surgical History:  Procedure Laterality Date   COLONOSCOPY     SARK 2009     Dr. Darrick Meigs     Allergies  Allergen Reactions    Celecoxib     REACTION: blood pressure went up   Codeine Itching   Crestor [Rosuvastatin Calcium]     Muscle cramping and achy   Fexofenadine Other (See Comments)    unknown   Penicillins Rash    Outpatient Encounter Medications as of 08/18/2022  Medication Sig   amLODipine (NORVASC) 10 MG tablet Take 1 tablet (10 mg total) by mouth daily.   Apoaequorin (PREVAGEN) 10 MG CAPS Take 1 capsule by mouth daily.   aspirin EC 81 MG tablet Take 1 tablet (81 mg total) by mouth daily.   aspirin EC 81 MG tablet Take 81 mg by mouth daily.   atorvastatin (LIPITOR) 10 MG tablet Take 1 tablet (10 mg total) by mouth 3 (three) times a week.   cetirizine (ZYRTEC) 10 MG tablet Take 1 tablet by mouth once daily   Cholecalciferol (VITAMIN D3) 125 MCG (5000 UT) capsule Take 1 capsule (5,000 Units total) by mouth every morning.   diclofenac Sodium (VOLTAREN) 1 % GEL Apply topically to right knee 4X/day as needed for pain   Docusate Sodium (DSS) 100 MG CAPS Take 100 mg by mouth daily.   donepezil (ARICEPT) 10 MG tablet TAKE 1 TABLET BY MOUTH AT BEDTIME   fluticasone (FLONASE) 50 MCG/ACT nasal spray Use 2 spray(s) in each nostril once daily   lisinopril (ZESTRIL) 40 MG tablet Take 1 tablet by mouth once daily   pregabalin (LYRICA)  25 MG capsule Take 1 capsule (25 mg total) by mouth 2 (two) times daily.   sertraline (ZOLOFT) 25 MG tablet Take 2 tablets (50 mg total) by mouth daily.   tiZANidine (ZANAFLEX) 4 MG tablet Take 1 tablet (4 mg total) by mouth 2 (two) times daily as needed.   No facility-administered encounter medications on file as of 08/18/2022.    Review of Systems  Constitutional:  Positive for fatigue. Negative for appetite change, chills, fever and unexpected weight change.  HENT:  Positive for rhinorrhea. Negative for congestion, dental problem, ear discharge, ear pain, facial swelling, hearing loss, nosebleeds, postnasal drip, sinus pressure, sinus pain, sneezing, sore throat, tinnitus and  trouble swallowing.   Eyes:  Negative for pain, discharge, redness, itching and visual disturbance.  Respiratory:  Positive for cough. Negative for chest tightness, shortness of breath and wheezing.   Cardiovascular:  Negative for chest pain, palpitations and leg swelling.  Gastrointestinal:  Negative for abdominal distention, abdominal pain, blood in stool, constipation, diarrhea, nausea and vomiting.  Skin:  Negative for color change, pallor and rash.  Neurological:  Negative for dizziness, weakness, light-headedness and headaches.  Hematological:  Does not bruise/bleed easily.    Immunization History  Administered Date(s) Administered   Fluad Quad(high Dose 65+) 10/29/2018, 10/14/2020   Influenza, High Dose Seasonal PF 11/24/2017   Influenza,inj,Quad PF,6+ Mos 09/24/2015, 12/15/2016   Moderna Sars-Covid-2 Vaccination 03/29/2019, 04/26/2019, 11/23/2019, 12/17/2019   Pneumococcal Conjugate-13 12/16/2015   Pneumococcal Polysaccharide-23 06/27/2018, 07/19/2021   Td 01/17/2017   Td,absorbed, Preservative Free, Adult Use, Lf Unspecified 11/23/2010   Tdap 11/23/2010   Pertinent  Health Maintenance Due  Topic Date Due   Colonoscopy  10/17/2021   INFLUENZA VACCINE  08/11/2022   DEXA SCAN  Completed      07/19/2021   10:38 AM 07/20/2021    8:31 AM 08/02/2021    2:45 PM 02/22/2022   12:48 PM 08/17/2022   12:56 PM  Fall Risk  Falls in the past year? 0 0 0 0 0  Was there an injury with Fall? 0   0 0  Fall Risk Category Calculator 0   0 0  Fall Risk Category (Retired) Low      (RETIRED) Patient Fall Risk Level Low fall risk      Patient at Risk for Falls Due to No Fall Risks   No Fall Risks   Fall risk Follow up Falls evaluation completed   Falls evaluation completed    Functional Status Survey:    There were no vitals filed for this visit. There is no height or weight on file to calculate BMI. Physical Exam Constitutional:      General: She is not in acute distress.    Appearance:  She is not ill-appearing.     Comments: Observed lying in bed   Pulmonary:     Effort: Pulmonary effort is normal. No respiratory distress.  Neurological:     Mental Status: She is alert and oriented to person, place, and time.  Psychiatric:        Mood and Affect: Mood normal.        Behavior: Behavior normal.    Labs reviewed: Recent Labs    02/22/22 1331  NA 142  K 4.1  CL 106  CO2 25  GLUCOSE 74  BUN 11  CREATININE 0.81  CALCIUM 9.7   No results for input(s): "AST", "ALT", "ALKPHOS", "BILITOT", "PROT", "ALBUMIN" in the last 8760 hours. Recent Labs    02/22/22 1331  WBC 7.4  NEUTROABS 5,032  HGB 11.8  HCT 37.7  MCV 76.2*  PLT 375   Lab Results  Component Value Date   TSH 1.00 07/19/2021   Lab Results  Component Value Date   HGBA1C 5.9 (H) 07/19/2021   Lab Results  Component Value Date   CHOL 215 (H) 07/19/2021   HDL 52 07/19/2021   LDLCALC 142 (H) 07/19/2021   TRIG 101 07/19/2021   CHOLHDL 4.1 07/19/2021    Significant Diagnostic Results in last 30 days:  No results found.  Assessment/Plan 1. COVID-19 virus infection Tested positive with home kit 08/18/2022  Start on Paxlovid has taken in the past with effect. - supportive care with Vitamin C,zinc,D andMucinex for cough.  - nirmatrelvir/ritonavir (PAXLOVID) 20 x 150 MG & 10 x 100MG  TABS; Take 3 tablets by mouth 2 (two) times daily for 5 days. (Take nirmatrelvir 150 mg two tablets twice daily for 5 days and ritonavir 100 mg one tablet twice daily for 5 days) Patient GFR is 75  Dispense: 30 tablet; Refill: 0 - vitamin C (VITAMIN C) 250 MG tablet; Take 1 tablet (250 mg total) by mouth 2 (two) times daily for 14 days.  Dispense: 28 tablet; Refill: 0 - zinc gluconate 50 MG tablet; Take 1 tablet (50 mg total) by mouth daily for 14 days.  Dispense: 14 tablet; Refill: 0 - guaiFENesin (MUCINEX) 600 MG 12 hr tablet; Take 1 tablet (600 mg total) by mouth 2 (two) times daily for 14 days.  Dispense: 28 tablet;  Refill: 0 - Notify provider or go to ED if you develop any chest tightness,chest pain or shortness of breath   2. Upper respiratory tract infection due to COVID-19 virus - supportive care with vitamin C ,Zinc,D and mucinex  - over the counter Loratadine or Zyrtec for runny nose  - nirmatrelvir/ritonavir (PAXLOVID) 20 x 150 MG & 10 x 100MG  TABS; Take 3 tablets by mouth 2 (two) times daily for 5 days. (Take nirmatrelvir 150 mg two tablets twice daily for 5 days and ritonavir 100 mg one tablet twice daily for 5 days) Patient GFR is 75  Dispense: 30 tablet; Refill: 0 - vitamin C (VITAMIN C) 250 MG tablet; Take 1 tablet (250 mg total) by mouth 2 (two) times daily for 14 days.  Dispense: 28 tablet; Refill: 0 - zinc gluconate 50 MG tablet; Take 1 tablet (50 mg total) by mouth daily for 14 days.  Dispense: 14 tablet; Refill: 0 - guaiFENesin (MUCINEX) 600 MG 12 hr tablet; Take 1 tablet (600 mg total) by mouth 2 (two) times daily for 14 days.  Dispense: 28 tablet; Refill: 0 - increase your fruits intake in your diet  - increase your water intake to 6-8 glasses of water daily    Family/ staff Communication: Reviewed plan of care with patient verbalized understanding  Labs/tests ordered: None   Next Appointment: Return if symptoms worsen or fail to improve.  I connected with  Camden C Glaude on 08/18/22 by a video enabled telemedicine application and verified that I am speaking with the correct person using two identifiers.   I discussed the limitations of evaluation and management by telemedicine. The patient expressed understanding and agreed to proceed.   Spent 12 minutes of face to face with patient  >50% time spent counseling; reviewing medical record; labs; and developing future plan of care.   Caesar Bookman, NP

## 2022-08-18 NOTE — Patient Instructions (Signed)
-   Hold Atorvastatin 10 mg tablet daily for 5 days while on Paxlovid  - Take Zinc 50 mg tablet one by mouth daily for 14 days  - Take Vitamin C 250 mg tablet one by mouth twice daily x 14 days  - continue on vitamin D supplement  - Tylenol as needed for fever or body aches  - over the counter Mucinex as needed for cough  - increase your fruits intake in your diet  - increase your water intake to 6-8 glasses of water daily  - Notify provider or go to ED if you develop any chest tightness,chest pain or shortness of breath

## 2022-08-18 NOTE — Telephone Encounter (Signed)
Post procedure follow up.  Patient states she is doing good.  

## 2022-09-21 ENCOUNTER — Ambulatory Visit: Payer: Medicare Other | Admitting: Student in an Organized Health Care Education/Training Program

## 2022-09-28 ENCOUNTER — Other Ambulatory Visit: Payer: Self-pay | Admitting: Family

## 2022-09-28 DIAGNOSIS — R413 Other amnesia: Secondary | ICD-10-CM

## 2022-09-29 ENCOUNTER — Ambulatory Visit
Payer: Medicare Other | Attending: Student in an Organized Health Care Education/Training Program | Admitting: Student in an Organized Health Care Education/Training Program

## 2022-09-29 ENCOUNTER — Encounter: Payer: Self-pay | Admitting: Student in an Organized Health Care Education/Training Program

## 2022-09-29 VITALS — BP 139/80 | HR 78 | Temp 97.4°F | Ht 69.0 in | Wt 172.0 lb

## 2022-09-29 DIAGNOSIS — M1711 Unilateral primary osteoarthritis, right knee: Secondary | ICD-10-CM | POA: Insufficient documentation

## 2022-09-29 DIAGNOSIS — G894 Chronic pain syndrome: Secondary | ICD-10-CM | POA: Diagnosis not present

## 2022-09-29 NOTE — Progress Notes (Signed)
Safety precautions to be maintained throughout the outpatient stay will include: orient to surroundings, keep bed in low position, maintain call bell within reach at all times, provide assistance with transfer out of bed and ambulation.  

## 2022-09-29 NOTE — Progress Notes (Signed)
PROVIDER NOTE: Information contained herein reflects review and annotations entered in association with encounter. Interpretation of such information and data should be left to medically-trained personnel. Information provided to patient can be located elsewhere in the medical record under "Patient Instructions". Document created using STT-dictation technology, any transcriptional errors that may result from process are unintentional.    Patient: Kaylee Warner  Service Category: E/M  Provider: Edward Jolly, MD  DOB: [birth year]  DOS: 09/29/2022  Referring Provider: Caesar Bookman, NP  MRN: [id number]  Specialty: Interventional Pain Management  PCP: Caesar Bookman, NP  Type: Established Patient  Setting: Ambulatory outpatient    Location: Office  Delivery: Face-to-face     HPI  Ms. ANJAIL SANDEN, a 77 y.o. 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 Ms. Bezio primary complain today is Knee Pain (right)  Pertinent problems: Ms. Mcduffey has Rotator cuff syndrome of right shoulder; Chronic pain syndrome; Depression; Right rotator cuff tear arthropathy; Spinal stenosis, lumbar region, with neurogenic claudication; Lumbar facet arthropathy; Lumbar degenerative disc disease; and Chronic radicular lumbar pain on their pertinent problem list. Pain Assessment: Severity of Chronic pain is reported as a 8 /10. Location: Knee Right/knee. Onset: More than a month ago. Quality: Aching, Constant, Discomfort. Timing: Constant. Modifying factor(s): meds, ice. Vitals:  height is 5\' 9"  (1.753 m) and weight is 172 lb (78 kg). Her temperature is 97.4 F (36.3 C) (abnormal). Her blood pressure is 139/80 and her pulse is 78. Her oxygen saturation is 98%.  BMI: Estimated body mass index is 25.4 kg/m as calculated from the following:   Height as of this encounter: 5\' 9"  (1.753 m).   Weight as of this encounter: 172 lb (78 kg). Last encounter: 12/30/2021. Last procedure:  08/17/2022.  Reason for encounter: post-procedure evaluation and assessment.    Post-procedure evaluation   Type: Steroid Intra-articular Knee Injection (ER Triamcinolone) Laterality: Right (-RT) Level/approach: Medial Imaging guidance: None required (CPT-20610) Anesthesia: Local anesthesia (1-2% Lidocaine) Anxiolysis: None                 Sedation: None.  Purpose: Diagnostic/Therapeutic Indications: Knee arthralgia associated to osteoarthritis of the knee 1. Primary osteoarthritis of right knee   2. Chronic pain syndrome   3. Lumbar radiculopathy   4. Cervicalgia    NAS-11 score:   Pre-procedure: 10-Worst pain ever/10   Post-procedure: 4  (standing,walking)/10     Effectiveness:  Initial hour after procedure: 100 %  Subsequent 4-6 hours post-procedure: 100 %  Analgesia past initial 6 hours: 0 %  Ongoing improvement:  Analgesic:  0%      ROS  Constitutional: Denies any fever or chills Gastrointestinal: No reported hemesis, hematochezia, vomiting, or acute GI distress Musculoskeletal:  persistent right knee pain Neurological: No reported episodes of acute onset apraxia, aphasia, dysarthria, agnosia, amnesia, paralysis, loss of coordination, or loss of consciousness  Medication Review  Apoaequorin, DSS, Vitamin D3, amLODipine, aspirin EC, atorvastatin, cetirizine, diclofenac Sodium, donepezil, fluticasone, lisinopril, pregabalin, sertraline, and tiZANidine  History Review  Allergy: Ms. Rude is allergic to celecoxib, codeine, crestor [rosuvastatin calcium], fexofenadine, and penicillins. Drug: Ms. Fitzner  reports no history of drug use. Alcohol:  reports current alcohol use of about 7.0 standard drinks of alcohol per week. Tobacco:  reports that she has never smoked. She has never used smokeless tobacco. Social: Ms. Roehr  reports that she has never smoked. She has never used smokeless tobacco. She reports current alcohol use of  about 7.0 standard drinks of alcohol per  week. She reports that she does not use drugs. Medical:  has a past medical history of Allergy, Anemia, Arthritis, and Hypertension. Surgical: Ms. Gullatt  has a past surgical history that includes SARK 2009 and Colonoscopy. Family: family history includes Arthritis in an other family member; Diabetes in her sister, son, son, and another family member; Hypertension in her son; Sjogren's syndrome in her daughter.  Laboratory Chemistry Profile   Renal Lab Results  Component Value Date   BUN 11 02/22/2022   CREATININE 0.81 02/22/2022   BCR SEE NOTE: 02/22/2022   GFRAA 106 05/08/2019   GFRNONAA 92 05/08/2019    Hepatic Lab Results  Component Value Date   AST 14 07/19/2021   ALT 12 07/19/2021   ALBUMIN 4.0 12/16/2015   ALKPHOS 53 12/16/2015    Electrolytes Lab Results  Component Value Date   NA 142 02/22/2022   K 4.1 02/22/2022   CL 106 02/22/2022   CALCIUM 9.7 02/22/2022    Bone No results found for: "VD25OH", "VD125OH2TOT", "UX3244WN0", "UV2536UY4", "25OHVITD1", "25OHVITD2", "25OHVITD3", "TESTOFREE", "TESTOSTERONE"  Inflammation (CRP: Acute Phase) (ESR: Chronic Phase) No results found for: "CRP", "ESRSEDRATE", "LATICACIDVEN"       Note: Above Lab results reviewed.   Physical Exam  General appearance: Well nourished, well developed, and well hydrated. In no apparent acute distress Mental status: Alert, oriented x 3 (person, place, & time)       Respiratory: No evidence of acute respiratory distress Eyes: PERLA Vitals: BP 139/80   Pulse 78   Temp (!) 97.4 F (36.3 C)   Ht 5\' 9"  (1.753 m)   Wt 172 lb (78 kg)   SpO2 98%   BMI 25.40 kg/m  BMI: Estimated body mass index is 25.4 kg/m as calculated from the following:   Height as of this encounter: 5\' 9"  (1.753 m).   Weight as of this encounter: 172 lb (78 kg). Ideal: Ideal body weight: 66.2 kg (145 lb 15.1 oz) Adjusted ideal body weight: 70.9 kg (156 lb 5.9 oz)  Persistent right knee pain, worse with weight  bearing Arthropathic arthralgia  Assessment   Diagnosis Status  1. Primary osteoarthritis of right knee   2. Chronic pain syndrome    Deteriorating Persistent     Plan of Care    Orders:  Orders Placed This Encounter  Procedures   GENICULAR NERVE BLOCK    Indication(s):  Sub-acute knee pain    Standing Status:   Future    Standing Expiration Date:   12/29/2022    Scheduling Instructions:     Side: RIGHT     Sedation: PO Valium     Timeframe: As soon as schedule allows    Order Specific Question:   Where will this procedure be performed?    Answer:   [facility]   Follow-up plan:   Return in about 18 days (around 10/17/2022) for Right GNB, in clinic (PO Valium).      Right posterior glenohumeral joint injection 10/28/2019, 05/25/20, right anterior glenohumeral joint injection 04/12/21, right knee triamcinolone ER; right posterior glenohumeral joint injection, right posterior trapezius TPI 08/02/2021      Recent Visits Date Type Provider Dept  08/17/22 Procedure visit Edward Jolly, MD [facility]  Showing recent visits within past 90 days and meeting all other requirements Today's Visits Date Type Provider Dept  09/29/22 Office Visit Edward Jolly, MD [facility]  Showing today's visits and meeting all other requirements  Future Appointments Date Type Provider Dept  10/10/22 Appointment Edward Jolly, MD [facility]  Showing future appointments within next 90 days and meeting all other requirements  I discussed the assessment and treatment plan with the patient. The patient was provided an opportunity to ask questions and all were answered. The patient agreed with the plan and demonstrated an understanding of the instructions.  Patient advised to call back or seek an in-person evaluation if the symptoms or condition worsens.  Duration of encounter: 15 minutes.  Total time on encounter, as per AMA guidelines included both the  face-to-face and non-face-to-face time personally spent by the physician and/or other qualified health care professional(s) on the day of the encounter (includes time in activities that require the physician or other qualified health care professional and does not include time in activities normally performed by clinical staff). Physician's time may include the following activities when performed: Preparing to see the patient (e.g., pre-charting review of records, searching for previously ordered imaging, lab work, and nerve conduction tests) Review of prior analgesic pharmacotherapies. Reviewing PMP Interpreting ordered tests (e.g., lab work, imaging, nerve conduction tests) Performing post-procedure evaluations, including interpretation of diagnostic procedures Obtaining and/or reviewing separately obtained history Performing a medically appropriate examination and/or evaluation Counseling and educating the patient/family/caregiver Ordering medications, tests, or procedures Referring and communicating with other health care professionals (when not separately reported) Documenting clinical information in the electronic or other health record Independently interpreting results (not separately reported) and communicating results to the patient/ family/caregiver Care coordination (not separately reported)  Note by: Edward Jolly, MD Date: 09/29/2022; Time: 12:23 PM

## 2022-10-10 ENCOUNTER — Ambulatory Visit
Payer: Medicare Other | Attending: Student in an Organized Health Care Education/Training Program | Admitting: Student in an Organized Health Care Education/Training Program

## 2022-10-10 ENCOUNTER — Encounter: Payer: Self-pay | Admitting: Student in an Organized Health Care Education/Training Program

## 2022-10-10 ENCOUNTER — Other Ambulatory Visit: Payer: Self-pay | Admitting: Adult Health

## 2022-10-10 ENCOUNTER — Ambulatory Visit
Admission: RE | Admit: 2022-10-10 | Discharge: 2022-10-10 | Disposition: A | Discharge status: Home / Self Care | Payer: Medicare Other | Source: Ambulatory Visit | Attending: Student in an Organized Health Care Education/Training Program | Admitting: Student in an Organized Health Care Education/Training Program

## 2022-10-10 DIAGNOSIS — F321 Major depressive disorder, single episode, moderate: Secondary | ICD-10-CM

## 2022-10-10 DIAGNOSIS — G894 Chronic pain syndrome: Secondary | ICD-10-CM | POA: Diagnosis not present

## 2022-10-10 DIAGNOSIS — M1711 Unilateral primary osteoarthritis, right knee: Secondary | ICD-10-CM | POA: Insufficient documentation

## 2022-10-10 MED ORDER — METHYLPREDNISOLONE ACETATE 80 MG/ML IJ SUSP
80.0000 mg | Freq: Once | INTRAMUSCULAR | Status: AC
  Administered 2022-10-10: 80 mg via INTRA_ARTICULAR

## 2022-10-10 MED ORDER — LIDOCAINE HCL 2 % IJ SOLN
20.0000 mL | Freq: Once | INTRAMUSCULAR | Status: AC
  Administered 2022-10-10: 400 mg

## 2022-10-10 MED ORDER — ROPIVACAINE HCL 2 MG/ML IJ SOLN
9.0000 mL | Freq: Once | INTRAMUSCULAR | Status: AC
  Administered 2022-10-10: 9 mL via INTRA_ARTICULAR

## 2022-10-10 MED ORDER — METHYLPREDNISOLONE ACETATE 80 MG/ML IJ SUSP
INTRAMUSCULAR | Status: AC
  Filled 2022-10-10: qty 1

## 2022-10-10 MED ORDER — ROPIVACAINE HCL 2 MG/ML IJ SOLN
INTRAMUSCULAR | Status: AC
  Filled 2022-10-10: qty 20

## 2022-10-10 MED ORDER — DIAZEPAM 5 MG PO TABS
ORAL_TABLET | ORAL | Status: AC
  Filled 2022-10-10: qty 1

## 2022-10-10 MED ORDER — LIDOCAINE HCL 2 % IJ SOLN
INTRAMUSCULAR | Status: AC
  Filled 2022-10-10: qty 20

## 2022-10-10 NOTE — Progress Notes (Signed)
PROVIDER NOTE: Interpretation of information contained herein should be left to medically-trained personnel. Specific patient instructions are provided elsewhere under "Patient Instructions" section of medical record. This document was created in part using STT-dictation technology, any transcriptional errors that may result from this process are unintentional.  Patient: Kaylee Warner Type: Established DOB: 1945/06/20 MRN: [id number] PCP: Caesar Bookman, NP  Service: Procedure DOS: 10/10/2022 Setting: Ambulatory Location: Ambulatory outpatient facility Delivery: Face-to-face Provider: Edward Jolly, MD Specialty: Interventional Pain Management Specialty designation: 09 Location: Outpatient facility Ref. Prov.: Edward Jolly, MD       Interventional Therapy   Procedure:               Type: Genicular Nerves Block (Superolateral, Superomedial, and Inferomedial Genicular Nerves)  #1  Laterality: Right (-RT)  Level: Superior and inferior to the knee joint.  Imaging: Fluoroscopic guidance Anesthesia: Local anesthesia (1-2% Lidocaine) DOS: 10/10/2022  Performed by: Edward Jolly, MD  Purpose: Diagnostic/Therapeutic Indications: Chronic knee pain severe enough to impact quality of life or function. Rationale (medical necessity): procedure needed and proper for the diagnosis and/or treatment of Ms. Cazier's medical symptoms and needs. 1. Primary osteoarthritis of right knee   2. Chronic pain syndrome    NAS-11 Pain score:   Pre-procedure: 9 /10   Post-procedure: 0-No pain/10     Target: For Genicular Nerve block(s), the targets are: the superolateral genicular nerve, located in the lateral distal portion of the femoral shaft as it curves to form the lateral epicondyle, in the region of the distal femoral metaphysis; the superomedial genicular nerve, located in the medial distal portion of the femoral shaft as it curves to form the medial epicondyle; and the inferomedial genicular nerve,  located in the medial, proximal portion of the tibial shaft, as it curves to form the medial epicondyle, in the region of the proximal tibial metaphysis.  Location: Superolateral, Superomedial, and Inferomedial aspects of knee joint.  Region: Lateral, Anterior, and Medial aspects of the knee joint, above and below the knee joint proper. Approach: Percutaneous  Type of procedure: Percutaneous perineural nerve block. The genicular nerve block is a motor-sparing technique that anesthetizes the sensory terminal branches innervating the knee joint, resulting in anesthesia of the anterior compartment of the knee. The distribution of anesthesia of each nerve is mostly in the corresponding quadrant.  Neuroanatomy: The superolateral genicular nerve (SLGN) courses around the femur shaft to pass between the vastus lateralis and the lateral epicondyle. It accompanies the superior lateral genicular artery. The superomedial genicular nerve (SMGN) courses around the femur shaft, following the superior medial genicular artery, to pass between the adductor magnus tendon and the medial epicondyle below the vastus medialis. The inferolateral genicular nerve (ILGN) courses around the tibial lateral epicondyle deep to the lateral collateral ligament, following the inferior lateral genicular artery, superior of the fibula head. The inferomedial genicular nerve (IMGN) courses horizontally below the medial collateral ligament between the tibial medial epicondyle and the insertion of the collateral ligament. It accompanies the inferior medial genicular artery. The recurrent peroneal nerve originates in the inferior popliteal region from the common peroneal nerve and courses horizontally around the fibula to pass just inferior of the fibula head and travel superior to the anterolateral tibial epicondyle. It accompanies the recurrent tibial artery.  Position / Prep / Materials:  Position: Supine, Modified Fowler's position with  pillows under the targeted knee(s). The patient is placed in a supine position with the knee slightly flexed by placing a pillow in the popliteal  fossa. Prep solution: DuraPrep (Iodine Povacrylex [0.7% available iodine] and Isopropyl Alcohol, 74% w/w) Prep Area: Entire knee area, from mid-thigh to mid-shin, lateral, anterior, and medial aspects. Materials:  Tray: Block Needle(s):  Type: Spinal  Gauge (G): 22  Length: 3.5-in  Qty: 3  H&P (Pre-op Assessment):  Ms. Carras is a 77 y.o. (year old), female patient, seen today for interventional treatment. She  has a past surgical history that includes SARK 2009 and Colonoscopy. Ms. Serfass has a current medication list which includes the following prescription(s): amlodipine, prevagen, aspirin ec, aspirin ec, cetirizine, vitamin d3, diclofenac sodium, dss, donepezil, fluticasone, lisinopril, pregabalin, sertraline, tizanidine, and atorvastatin. Her primarily concern today is the Knee Pain (right)  Initial Vital Signs:  Pulse/HCG Rate: 80  Temp: (!) 97.3 F (36.3 C) Resp: 16 BP: (!) 143/88 SpO2: 100 %  BMI: Estimated body mass index is 26.43 kg/m as calculated from the following:   Height as of this encounter: 5\' 9"  (1.753 m).   Weight as of this encounter: 179 lb (81.2 kg).  Risk Assessment: Allergies: Reviewed. She is allergic to celecoxib, codeine, crestor [rosuvastatin calcium], fexofenadine, and penicillins.  Allergy Precautions: None required Coagulopathies: Reviewed. None identified.  Blood-thinner therapy: None at this time Active Infection(s): Reviewed. None identified. Ms. Cwikla is afebrile  Site Confirmation: Ms. Henneman was asked to confirm the procedure and laterality before marking the site Procedure checklist: Completed Consent: Before the procedure and under the influence of no sedative(s), amnesic(s), or anxiolytics, the patient was informed of the treatment options, risks and possible complications. To fulfill our ethical  and legal obligations, as recommended by the American Medical Association's Code of Ethics, I have informed the patient of my clinical impression; the nature and purpose of the treatment or procedure; the risks, benefits, and possible complications of the intervention; the alternatives, including doing nothing; the risk(s) and benefit(s) of the alternative treatment(s) or procedure(s); and the risk(s) and benefit(s) of doing nothing. The patient was provided information about the general risks and possible complications associated with the procedure. These may include, but are not limited to: failure to achieve desired goals, infection, bleeding, organ or nerve damage, allergic reactions, paralysis, and death. In addition, the patient was informed of those risks and complications associated to the procedure, such as failure to decrease pain; infection; bleeding; organ or nerve damage with subsequent damage to sensory, motor, and/or autonomic systems, resulting in permanent pain, numbness, and/or weakness of one or several areas of the body; allergic reactions; (i.e.: anaphylactic reaction); and/or death. Furthermore, the patient was informed of those risks and complications associated with the medications. These include, but are not limited to: allergic reactions (i.e.: anaphylactic or anaphylactoid reaction(s)); adrenal axis suppression; blood sugar elevation that in diabetics may result in ketoacidosis or comma; water retention that in patients with history of congestive heart failure may result in shortness of breath, pulmonary edema, and decompensation with resultant heart failure; weight gain; swelling or edema; medication-induced neural toxicity; particulate matter embolism and blood vessel occlusion with resultant organ, and/or nervous system infarction; and/or aseptic necrosis of one or more joints. Finally, the patient was informed that Medicine is not an exact science; therefore, there is also the  possibility of unforeseen or unpredictable risks and/or possible complications that may result in a catastrophic outcome. The patient indicated having understood very clearly. We have given the patient no guarantees and we have made no promises. Enough time was given to the patient to ask questions, all of which were answered  to the patient's satisfaction. Ms. Smedley has indicated that she wanted to continue with the procedure. Attestation: I, the ordering provider, attest that I have discussed with the patient the benefits, risks, side-effects, alternatives, likelihood of achieving goals, and potential problems during recovery for the procedure that I have provided informed consent. Date  Time: 10/10/2022 11:07 AM  Pre-Procedure Preparation:  Monitoring: As per clinic protocol. Respiration, ETCO2, SpO2, BP, heart rate and rhythm monitor placed and checked for adequate function Safety Precautions: Patient was assessed for positional comfort and pressure points before starting the procedure. Time-out: I initiated and conducted the "Time-out" before starting the procedure, as per protocol. The patient was asked to participate by confirming the accuracy of the "Time Out" information. Verification of the correct person, site, and procedure were performed and confirmed by me, the nursing staff, and the patient. "Time-out" conducted as per Joint Commission's Universal Protocol (UP.01.01.01). Time: 1129 Start Time: 1129 hrs.  Description/Narrative of Procedure:          Rationale (medical necessity): procedure needed and proper for the diagnosis and/or treatment of the patient's medical symptoms and needs. Procedural Technique Safety Precautions: Aspiration looking for blood return was conducted prior to all injections. At no point did we inject any substances, as a needle was being advanced. No attempts were made at seeking any paresthesias. Safe injection practices and needle disposal techniques used.  Medications properly checked for expiration dates. SDV (single dose vial) medications used. Description of the Procedure: Protocol guidelines were followed. The patient was assisted into a comfortable position. The target area was identified and the area prepped in the usual manner. Skin & deeper tissues infiltrated with local anesthetic. Appropriate amount of time allowed to pass for local anesthetics to take effect. The procedure needles were then advanced to the target area. Proper needle placement secured. Negative aspiration confirmed. Solution injected in intermittent fashion, asking for systemic symptoms every 0.5cc of injectate. The needles were then removed and the area cleansed, making sure to leave some of the prepping solution back to take advantage of its long term bactericidal properties.  9 cc solution made of 8 cc of 0.2% ropivacaine, 1 cc of methylprednisolone, 80 mg/cc.  3 cc injected at each level below.             Vitals:   10/10/22 1109 10/10/22 1125 10/10/22 1135  BP: (!) 143/88 (!) 160/90 (!) 159/88  Pulse: 80 73 75  Resp: 16 16   Temp: (!) 97.3 F (36.3 C)    SpO2: 100% 100% 100%  Weight: 179 lb (81.2 kg)    Height: 5\' 9"  (1.753 m)       Start Time: 1129 hrs. End Time: 1133 hrs.  Imaging Guidance (Non-Spinal):          Type of Imaging Technique: Fluoroscopy Guidance (Non-Spinal) Indication(s): Assistance in needle guidance and placement for procedures requiring needle placement in or near specific anatomical locations not easily accessible without such assistance. Exposure Time: Please see nurses notes. Contrast: None used. Fluoroscopic Guidance: I was personally present during the use of fluoroscopy. "Tunnel Vision Technique" used to obtain the best possible view of the target area. Parallax error corrected before commencing the procedure. "Direction-depth-direction" technique used to introduce the needle under continuous pulsed fluoroscopy. Once target was  reached, antero-posterior, oblique, and lateral fluoroscopic projection used confirm needle placement in all planes. Images permanently stored in EMR. Interpretation: No contrast injected. I personally interpreted the imaging intraoperatively. Adequate needle placement confirmed in multiple  planes. Permanent images saved into the patient's record.  Post-operative Assessment:  Post-procedure Vital Signs:  Pulse/HCG Rate: 75  Temp: (!) 97.3 F (36.3 C) Resp: 16 BP: (!) 159/88 SpO2: 100 %  EBL: None  Complications: No immediate post-treatment complications observed by team, or reported by patient.  Note: The patient tolerated the entire procedure well. A repeat set of vitals were taken after the procedure and the patient was kept under observation following institutional policy, for this type of procedure. Post-procedural neurological assessment was performed, showing return to baseline, prior to discharge. The patient was provided with post-procedure discharge instructions, including a section on how to identify potential problems. Should any problems arise concerning this procedure, the patient was given instructions to immediately contact us, at any time, without hesitation. In any case, we plan to contact the patient by telephone for a follow-up status report regarding this interventional procedure.  Comments:  No additional relevant information.  Plan of Care (POC)  Orders:  Orders Placed This Encounter  Procedures   DG PAIN CL[facility] C-ARM 1-60 MIN NO REPORT    Intraoperative interpretation by procedural physician at [facility].    Standing Status:   Standing    Number of Occurrences:   1    Order Specific Question:   Reason for exam:    Answer:   Assistance in needle guidance and placement for procedures requiring needle placement in or near specific anatomical locations not easily accessible without such assistance.    Medications ordered for procedure: Meds ordered  this encounter  Medications   lidocaine (XYLOCAINE) 2 % (with pres) injection 400 mg   methylPREDNISolone acetate (DEPO-MEDROL) injection 80 mg   ropivacaine (PF) 2 mg/mL (0.2%) (NAROPIN) injection 9 mL   Medications administered: We administered lidocaine, methylPREDNISolone acetate, and ropivacaine (PF) 2 mg/mL (0.2%).  See the medical record for exact dosing, route, and time of administration.  Follow-up plan:   Return in about 4 weeks (around 11/07/2022) for PPE, VV.       Right posterior glenohumeral joint injection 10/28/2019, 05/25/20, right anterior glenohumeral joint injection 04/12/21, right knee triamcinolone ER; right posterior glenohumeral joint injection, right posterior trapezius TPI 08/02/2021. Right GNB  10/10/22      Recent Visits Date Type Provider Dept  09/29/22 Office Visit Edward Jolly, MD [facility]  08/17/22 Procedure visit Edward Jolly, MD [facility]  Showing recent visits within past 90 days and meeting all other requirements Today's Visits Date Type Provider Dept  10/10/22 Procedure visit Edward Jolly, MD [facility]  Showing today's visits and meeting all other requirements Future Appointments Date Type Provider Dept  11/14/22 Appointment Edward Jolly, MD [facility]  Showing future appointments within next 90 days and meeting all other requirements  Disposition: Discharge home  Discharge (Date  Time): 10/10/2022; 1139 hrs.   Primary Care Physician: Caesar Bookman, NP Location: [facility] Note by: Edward Jolly, MD (TTS technology used. I apologize for any typographical errors that were not detected and corrected.) Date: 10/10/2022; Time: 11:46 AM  Disclaimer:  Medicine is not an Visual merchandiser. The only guarantee in medicine is that nothing is guaranteed. It is important to note that the decision to proceed with this intervention was based on the information collected from the  patient. The Data and conclusions were drawn from the patient's questionnaire, the interview, and the physical examination. Because the information was provided in large part by the patient, it cannot be guaranteed  that it has not been purposely or unconsciously manipulated. Every effort has been made to obtain as much relevant data as possible for this evaluation. It is important to note that the conclusions that lead to this procedure are derived in large part from the available data. Always take into account that the treatment will also be dependent on availability of resources and existing treatment guidelines, considered by other Pain Management Practitioners as being common knowledge and practice, at the time of the intervention. For Medico-Legal purposes, it is also important to point out that variation in procedural techniques and pharmacological choices are the acceptable norm. The indications, contraindications, technique, and results of the above procedure should only be interpreted and judged by a Board-Certified Interventional Pain Specialist with extensive familiarity and expertise in the same exact procedure and technique.

## 2022-10-10 NOTE — Progress Notes (Signed)
Safety precautions to be maintained throughout the outpatient stay will include: orient to surroundings, keep bed in low position, maintain call bell within reach at all times, provide assistance with transfer out of bed and ambulation.  

## 2022-10-10 NOTE — Patient Instructions (Signed)

## 2022-10-11 ENCOUNTER — Telehealth: Payer: Self-pay | Admitting: Student in an Organized Health Care Education/Training Program

## 2022-10-11 NOTE — Telephone Encounter (Signed)
Patient returned call for post-procedure f/u. No concerns or problems.

## 2022-10-11 NOTE — Telephone Encounter (Signed)
Attempt to call patient for post-procedure follow-up. No answer. LVM.

## 2022-10-14 ENCOUNTER — Other Ambulatory Visit: Payer: Self-pay | Admitting: Family

## 2022-10-14 DIAGNOSIS — I10 Essential (primary) hypertension: Secondary | ICD-10-CM

## 2022-10-17 ENCOUNTER — Telehealth: Payer: Self-pay | Admitting: Student in an Organized Health Care Education/Training Program

## 2022-10-17 NOTE — Telephone Encounter (Signed)
PT called states that she is stiff, after sitting down for a certain time, when she goes to get up she needs a cane to assist her. PT asked is this part of the side affect to the medication that she took. Please give patient a call. TY

## 2022-10-18 NOTE — Telephone Encounter (Signed)
Having stiffness in knee following GNB on 10-10-22. Reassured patient this may be expected.

## 2022-11-04 ENCOUNTER — Other Ambulatory Visit: Payer: Self-pay | Admitting: Family

## 2022-11-04 DIAGNOSIS — J310 Chronic rhinitis: Secondary | ICD-10-CM

## 2022-11-14 ENCOUNTER — Telehealth: Payer: Medicare Other | Admitting: Student in an Organized Health Care Education/Training Program

## 2022-11-15 ENCOUNTER — Encounter: Payer: Self-pay | Admitting: Student in an Organized Health Care Education/Training Program

## 2022-11-15 ENCOUNTER — Ambulatory Visit
Payer: Medicare Other | Attending: Student in an Organized Health Care Education/Training Program | Admitting: Student in an Organized Health Care Education/Training Program

## 2022-11-15 ENCOUNTER — Telehealth: Payer: Self-pay

## 2022-11-15 DIAGNOSIS — G894 Chronic pain syndrome: Secondary | ICD-10-CM

## 2022-11-15 DIAGNOSIS — M1711 Unilateral primary osteoarthritis, right knee: Secondary | ICD-10-CM

## 2022-11-15 DIAGNOSIS — M5416 Radiculopathy, lumbar region: Secondary | ICD-10-CM

## 2022-11-15 NOTE — Telephone Encounter (Signed)
Completed call for pre-appointment review of post-procedure evaluation.

## 2022-11-15 NOTE — Progress Notes (Signed)
I attempted to call the patient however no response. Voicemail left instructing patient to call front desk office at [phone] to reschedule virtual appointment. -Dr Cherylann Ratel   Post-procedure evaluation   Type: Genicular Nerves Block (Superolateral, Superomedial, and Inferomedial Genicular Nerves)  #1  Laterality: Right (-RT)  Level: Superior and inferior to the knee joint.  Imaging: Fluoroscopic guidance Anesthesia: Local anesthesia (1-2% Lidocaine) DOS: 10/10/2022  Performed by: Edward Jolly, MD  Purpose: Diagnostic/Therapeutic Indications: Chronic knee pain severe enough to impact quality of life or function. Rationale (medical necessity): procedure needed and proper for the diagnosis and/or treatment of Ms. Kaylee Warner's medical symptoms and needs. 1. Primary osteoarthritis of right knee   2. Chronic pain syndrome    NAS-11 Pain score:   Pre-procedure: 9 /10   Post-procedure: 0-No pain/10      Effectiveness:  Initial hour after procedure: 100 %  Subsequent 4-6 hours post-procedure: 100 %  Analgesia past initial 6 hours: 100% pain relief for 1 week then pain returned

## 2022-12-06 ENCOUNTER — Telehealth: Payer: Self-pay | Admitting: Student in an Organized Health Care Education/Training Program

## 2022-12-06 DIAGNOSIS — M1711 Unilateral primary osteoarthritis, right knee: Secondary | ICD-10-CM

## 2022-12-06 MED ORDER — DICLOFENAC SODIUM 75 MG PO TBEC: 75.0000 mg | DELAYED_RELEASE_TABLET | Freq: Two times a day (BID) | ORAL | 0 refills | Status: DC

## 2022-12-06 NOTE — Telephone Encounter (Signed)
She has not tried PO Diclofenac. She is willing to try.

## 2022-12-06 NOTE — Telephone Encounter (Signed)
Continues to have pain in right knee after GNB on 11-15-22. Do you have any suggestions?

## 2022-12-06 NOTE — Telephone Encounter (Signed)
Notified patient per voicemail.

## 2022-12-06 NOTE — Telephone Encounter (Signed)
PT stated that she is having pain in her knee wants to see about getting meds send in to pharmacy.

## 2023-01-02 DIAGNOSIS — M48062 Spinal stenosis, lumbar region with neurogenic claudication: Secondary | ICD-10-CM | POA: Diagnosis not present

## 2023-01-12 ENCOUNTER — Other Ambulatory Visit: Payer: Self-pay | Admitting: Family

## 2023-01-12 DIAGNOSIS — I10 Essential (primary) hypertension: Secondary | ICD-10-CM

## 2023-01-18 ENCOUNTER — Other Ambulatory Visit: Payer: Self-pay | Admitting: Student in an Organized Health Care Education/Training Program

## 2023-01-30 ENCOUNTER — Other Ambulatory Visit: Payer: Self-pay | Admitting: Family

## 2023-01-30 DIAGNOSIS — F321 Major depressive disorder, single episode, moderate: Secondary | ICD-10-CM

## 2023-01-31 ENCOUNTER — Other Ambulatory Visit: Payer: Self-pay | Admitting: Family

## 2023-01-31 ENCOUNTER — Telehealth: Payer: Self-pay

## 2023-01-31 ENCOUNTER — Telehealth: Payer: Self-pay | Admitting: Student in an Organized Health Care Education/Training Program

## 2023-01-31 DIAGNOSIS — J310 Chronic rhinitis: Secondary | ICD-10-CM

## 2023-01-31 NOTE — Telephone Encounter (Signed)
Called pt for VV  on 02/01/23  no answer, left message to call office to get information.

## 2023-01-31 NOTE — Telephone Encounter (Signed)
Patient has no upcoming appointment.please call to schedule for her six months follow up.

## 2023-01-31 NOTE — Telephone Encounter (Signed)
High Risk Warning Populated when attempting to refill, I will send to Provider for further review 

## 2023-01-31 NOTE — Telephone Encounter (Signed)
This patient missed her last VV appointment. Please call and have her schedule so that she can discuss with Dr. Cherylann Ratel. Thank you.

## 2023-01-31 NOTE — Telephone Encounter (Signed)
PT stated that the medication isn't working. PT wants to know what else can be done. If Lateef know of a provider that he can referral her to to get surgery for her right knee. Please give patient a call. TY

## 2023-02-01 ENCOUNTER — Ambulatory Visit
Payer: Medicare Other | Attending: Student in an Organized Health Care Education/Training Program | Admitting: Student in an Organized Health Care Education/Training Program

## 2023-02-01 DIAGNOSIS — M1711 Unilateral primary osteoarthritis, right knee: Secondary | ICD-10-CM

## 2023-02-01 NOTE — Patient Instructions (Signed)

## 2023-02-01 NOTE — Progress Notes (Signed)
I attempted to call the patient however no response. Voicemail left instructing patient to call front desk office at [phone] to reschedule appointment. -Dr Yamileth Hayse  

## 2023-02-01 NOTE — Telephone Encounter (Signed)
Noted  

## 2023-02-02 ENCOUNTER — Telehealth: Payer: Self-pay | Admitting: Student in an Organized Health Care Education/Training Program

## 2023-02-02 ENCOUNTER — Ambulatory Visit
Payer: Medicare Other | Attending: Student in an Organized Health Care Education/Training Program | Admitting: Student in an Organized Health Care Education/Training Program

## 2023-02-02 DIAGNOSIS — M5416 Radiculopathy, lumbar region: Secondary | ICD-10-CM | POA: Diagnosis not present

## 2023-02-02 DIAGNOSIS — M19011 Primary osteoarthritis, right shoulder: Secondary | ICD-10-CM

## 2023-02-02 DIAGNOSIS — M1711 Unilateral primary osteoarthritis, right knee: Secondary | ICD-10-CM

## 2023-02-02 DIAGNOSIS — G894 Chronic pain syndrome: Secondary | ICD-10-CM

## 2023-02-02 MED ORDER — NALTREXONE HCL (PAIN) 1.5 MG PO CAPS: ORAL_CAPSULE | ORAL | 0 refills | Status: AC

## 2023-02-02 NOTE — Progress Notes (Signed)
Patient: Kaylee Warner  Service Category: E/M  Provider: Edward Jolly, MD  DOB: 09-04-1945  DOS: 02/02/2023  Location: Office  MRN: [id number]  Setting: Ambulatory outpatient  Referring Provider: Caesar Bookman, NP  Type: Established Patient  Specialty: Interventional Pain Management  PCP: Caesar Bookman, NP  Location: Remote location  Delivery: TeleHealth     Virtual Encounter - Pain Management PROVIDER NOTE: Information contained herein reflects review and annotations entered in association with encounter. Interpretation of such information and data should be left to medically-trained personnel. Information provided to patient can be located elsewhere in the medical record under "Patient Instructions". Document created using STT-dictation technology, any transcriptional errors that may result from process are unintentional.    Contact & Pharmacy Preferred: [phone] Home: [phone] (home) Mobile: [phone] (mobile) E-mail: [employer] .[facility] [address], [place] - [address] [place] [address] Phone: [phone] Fax: [phone]   Pre-screening  Ms. Housman offered "in-person" vs "virtual" encounter. She indicated preferring virtual for this encounter.   Reason COVID-19*  Social distancing based on CDC and AMA recommendations.   I contacted Kaylee Warner on 02/02/2023 via telephone.      I clearly identified myself as Edward Jolly, MD. I verified that I was speaking with the correct person using two identifiers (Name: JEFFRIE ARONICA, and date of birth: [birth year]).  Consent I sought verbal advanced consent from Kaylee Warner for virtual visit interactions. I informed Ms. Caridi of possible security and privacy concerns, risks, and limitations associated with providing "not-in-person" medical evaluation and management services. I also informed Ms. Waidelich of the availability of "in-person" appointments. Finally, I informed her that there  would be a charge for the virtual visit and that she could be  personally, fully or partially, financially responsible for it. Ms. Bennick expressed understanding and agreed to proceed.   Historic Elements   Ms. HERMINA BAUMGARDNER is a 78 y.o. year old, female patient evaluated today after our last contact on 02/02/2023. Ms. Solinsky  has a past medical history of Allergy, Anemia, Arthritis, and Hypertension. She also  has a past surgical history that includes SARK 2009 and Colonoscopy. Ms. Tingle has a current medication list which includes the following prescription(s): naltrexone hcl (pain), amlodipine, prevagen, aspirin ec, aspirin ec, cetirizine, vitamin d3, diclofenac, diclofenac sodium, dss, donepezil, fluticasone, lisinopril, pregabalin, sertraline, and tizanidine. She  reports that she has never smoked. She has never used smokeless tobacco. She reports current alcohol use of about 7.0 standard drinks of alcohol per week. She reports that she does not use drugs. Ms. Kollmann is allergic to celecoxib, codeine, crestor [rosuvastatin calcium], fexofenadine, and penicillins.  BMI: Estimated body mass index is 26.43 kg/m as calculated from the following:   Height as of 10/10/22: 5\' 9"  (1.753 m).   Weight as of 10/10/22: 179 lb (81.2 kg). Last encounter: 02/01/2023. Last procedure: 10/10/2022.  HPI  Today, she is being contacted for a post-procedure assessment.  Patient states that she did not receive significant benefit with her genicular nerve block.  Do not recommend genicular nerve RFA at this time.  She states that she got better relief with her right knee intra-articular extended release triamcinolone injection that was done 08/17/2022 and she would like to repeat that in the future.  She is also inquiring about low-dose naltrexone for chronic pain management.  We discussed the evidence for this and also that there needs to be additional research not to  conclude its overall effectiveness.  We will trial it as  below.  Pharmacotherapy Assessment   Opioid Analgesic: Low-dose naltrexone start at 1.5 mg daily increase to 3 mg daily as below  Monitoring: [place] PMP: PDMP not reviewed this encounter.       Pharmacotherapy: No side-effects or adverse reactions reported. Compliance: No problems identified. Effectiveness: Clinically acceptable. Plan: Refer to "POC". UDS:  Summary  Date Value Ref Range Status  07/20/2021 Note  Final    Comment:    ==================================================================== Tox[employer] Select 13 (MW) ==================================================================== Test                             Result       Flag       Units  Drug Absent but Declared for Prescription Verification   Hydrocodone                    Not Detected UNEXPECTED ng/mg creat ==================================================================== Test                      Result    Flag   Units      Ref Range   Creatinine              76               mg/dL      >=60 ==================================================================== Declared Medications:  The flagging and interpretation on this report are based on the  following declared medications.  Unexpected results may arise from  inaccuracies in the declared medications.   **Note: The testing scope of this panel includes these medications:   Hydrocodone   **Note: The testing scope of this panel does not include the  following reported medications:   Acetaminophen  Amlodipine (Norvasc)  Aspirin  Atorvastatin (Lipitor)  Cetirizine (Zyrtec)  Donepezil (Aricept)  Fluticasone (Flonase)  Lisinopril (Zestril)  Meloxicam (Mobic)  Pregabalin (Lyrica)  Sertraline (Zoloft)  Tizanidine (Zanaflex)  Vitamin D3  Zinc ==================================================================== For clinical consultation, please call [phone]. ====================================================================    No results  found for: "CBDTHCR", "D8THCCBX", "D9THCCBX"   Laboratory Chemistry Profile   Renal Lab Results  Component Value Date   BUN 11 02/22/2022   CREATININE 0.81 02/22/2022   BCR SEE NOTE: 02/22/2022   GFRAA 106 05/08/2019   GFRNONAA 92 05/08/2019    Hepatic Lab Results  Component Value Date   AST 14 07/19/2021   ALT 12 07/19/2021   ALBUMIN 4.0 12/16/2015   ALKPHOS 53 12/16/2015    Electrolytes Lab Results  Component Value Date   NA 142 02/22/2022   K 4.1 02/22/2022   CL 106 02/22/2022   CALCIUM 9.7 02/22/2022    Bone No results found for: "VD25OH", "VD125OH2TOT", "YN8295AO1", "HY8657QI6", "25OHVITD1", "25OHVITD2", "25OHVITD3", "TESTOFREE", "TESTOSTERONE"  Inflammation (CRP: Acute Phase) (ESR: Chronic Phase) No results found for: "CRP", "ESRSEDRATE", "LATICACIDVEN"       Note: Above Lab results reviewed.  Imaging  DG PAIN CLINIC C-ARM 1-60 MIN NO REPORT Fluoro was used, but no Radiologist interpretation will be provided.  Please refer to "NOTES" tab for provider progress note.  Assessment  The primary encounter diagnosis was Primary osteoarthritis of right knee. Diagnoses of Lumbar radiculopathy, Arthropathy of right shoulder, and Chronic pain syndrome were also pertinent to this visit.  Plan of Care  1. Primary osteoarthritis of right knee (Primary) - Naltrexone HCl, Pain, 1.5 MG CAPS; Take 1.5 mg by mouth  daily after breakfast for 15 days, THEN 3 mg daily after breakfast.  Dispense: 75 capsule; Refill: 0 - KNEE INJECTION; Future  2. Lumbar radiculopathy - Naltrexone HCl, Pain, 1.5 MG CAPS; Take 1.5 mg by mouth daily after breakfast for 15 days, THEN 3 mg daily after breakfast.  Dispense: 75 capsule; Refill: 0  3. Arthropathy of right shoulder - Naltrexone HCl, Pain, 1.5 MG CAPS; Take 1.5 mg by mouth daily after breakfast for 15 days, THEN 3 mg daily after breakfast.  Dispense: 75 capsule; Refill: 0  4. Chronic pain syndrome - Naltrexone HCl, Pain, 1.5 MG CAPS; Take  1.5 mg by mouth daily after breakfast for 15 days, THEN 3 mg daily after breakfast.  Dispense: 75 capsule; Refill: 0    Ms. Stori C Parlier has a current medication list which includes the following long-term medication(s): amlodipine, cetirizine, donepezil, fluticasone, lisinopril, pregabalin, and sertraline.  Pharmacotherapy (Medications Ordered): Meds ordered this encounter  Medications   Naltrexone HCl, Pain, 1.5 MG CAPS    Sig: Take 1.5 mg by mouth daily after breakfast for 15 days, THEN 3 mg daily after breakfast.    Dispense:  75 capsule    Refill:  0   Orders:  Orders Placed This Encounter  Procedures   KNEE INJECTION    Local Anesthetic & Steroid injection.    Standing Status:   Future    Expected Date:   02/15/2023    Expiration Date:   05/03/2023    Scheduling Instructions:     Side: RIGHT knee ER Kennalog     Sedation: None     Timeframe: As soon as schedule allows    Where will this procedure be performed?:   [facility]   Follow-up plan:   No follow-ups on file.      Right posterior glenohumeral joint injection 10/28/2019, 05/25/20, right anterior glenohumeral joint injection 04/12/21, right knee triamcinolone ER; right posterior glenohumeral joint injection, right posterior trapezius TPI 08/02/2021. Right GNB  10/10/22      Recent Visits Date Type Provider Dept  02/01/23 Office Visit Edward Jolly, MD [facility]  11/15/22 Office Visit Edward Jolly, MD [facility]  Showing recent visits within past 90 days and meeting all other requirements Today's Visits Date Type Provider Dept  02/02/23 Office Visit Edward Jolly, MD [facility]  Showing today's visits and meeting all other requirements Future Appointments No visits were found meeting these conditions. Showing future appointments within next 90 days and meeting all other requirements  I discussed the assessment and treatment plan with the patient. The patient was  provided an opportunity to ask questions and all were answered. The patient agreed with the plan and demonstrated an understanding of the instructions.  Patient advised to call back or seek an in-person evaluation if the symptoms or condition worsens.  Duration of encounter: 20 minutes.  Note by: Edward Jolly, MD Date: 02/02/2023; Time: 3:32 PM

## 2023-02-06 NOTE — Telephone Encounter (Signed)
The patient called back and asked if Dr. Cherylann Ratel was going to call her out any meds. I have her knee injection scheduled for next Wednesday.

## 2023-02-06 NOTE — Telephone Encounter (Signed)
Attempted to call patient, message left.

## 2023-02-07 ENCOUNTER — Encounter: Payer: Self-pay | Admitting: Family

## 2023-02-07 ENCOUNTER — Ambulatory Visit (INDEPENDENT_AMBULATORY_CARE_PROVIDER_SITE_OTHER): Payer: Medicare Other | Admitting: Family

## 2023-02-07 VITALS — BP 132/84 | HR 78 | Temp 97.7°F | Resp 18 | Ht 69.0 in | Wt 171.4 lb

## 2023-02-07 DIAGNOSIS — E782 Mixed hyperlipidemia: Secondary | ICD-10-CM | POA: Diagnosis not present

## 2023-02-07 DIAGNOSIS — M1711 Unilateral primary osteoarthritis, right knee: Secondary | ICD-10-CM | POA: Diagnosis not present

## 2023-02-07 DIAGNOSIS — H6122 Impacted cerumen, left ear: Secondary | ICD-10-CM

## 2023-02-07 DIAGNOSIS — I10 Essential (primary) hypertension: Secondary | ICD-10-CM

## 2023-02-07 DIAGNOSIS — F321 Major depressive disorder, single episode, moderate: Secondary | ICD-10-CM

## 2023-02-07 DIAGNOSIS — R7303 Prediabetes: Secondary | ICD-10-CM

## 2023-02-07 NOTE — Progress Notes (Signed)
Provider: Richarda Blade FNP-C   Eron Staat, Donalee Citrin, NP  Patient Care Team: Markise Haymer, Donalee Citrin, NP as PCP - General (Family Medicine)  Extended Emergency Contact Information Primary Emergency Contact: Ellan Lambert [place] Mobile Phone: [phone] Relation: Son Secondary Emergency Contact: Hudon,James R Address: [address]          [place], [place] [address] [place] Home Phone: [phone] Relation: Spouse  Code Status:  Full Code  Goals of care: Advanced Directive information    02/07/2023    9:33 AM  Advanced Directives  Does Patient Have a Medical Advance Directive? Yes  Type of Estate agent of [place];Living will  Does patient want to make changes to medical advance directive? No - Patient declined  Copy of Healthcare Power of Attorney in Chart? No - copy requested     Chief Complaint  Patient presents with   Medical Management of Chronic Issues    6 month follow up and discuss colonoscopy,Medicare Annual Wellness Visit,tdap,flu,covid and shingles vaccines.   Discussed the use of AI scribe software for clinical note transcription with the patient, who gave verbal consent to proceed.  HPI:  Pt is a 78 y.o. female seen today for a six-month follow-up visit for chronic pain management.  She experiences chronic pain, particularly in her shoulder and knee, with generalized aching, especially at night. Her knee is described as 'giving me a fit,' and she regularly uses Voltaren gel, currently on her third tube. She experiences numbness in her fingers at night, which she attributes to a shoulder issue.  She is taking tizanidine 4 mg twice daily as needed for shoulder pain, prescribed by her pain doctor. She has not refilled her naloxone prescription, previously switched from hydrocodone 3.5 mg. She is also on Zoloft 50 mg daily for depression, lisinopril 40 mg daily, and amlodipine 10 mg daily for blood pressure. She  monitors her blood pressure at home and reports it as stable. For sinus and cold symptoms, she takes Zyrtec and Flonase, and uses Nyquil at night for drainage issues. She also takes a baby aspirin, vitamin D, vitamin C, and Epsicol. She is not using Colace as her bowel movements are regular due to increased fruit intake.  She drinks a lot of water and occasionally ginger ale, and has been eating onions to help with her cold. Her cholesterol levels have been trending down, and she is trying to minimize ice cream intake to manage her cholesterol and blood sugar levels.  She has a family history of cancer, with a sister who has stage four cancer and another sister with pancreatic cancer. She also has two sons with diabetes and kidney problems.  She has been actively involved in caring for her sister with stage four cancer, spending significant time at the hospital and nursing home.      Past Medical History:  Diagnosis Date   Allergy    Anemia    Arthritis    Hypertension    Past Surgical History:  Procedure Laterality Date   COLONOSCOPY     SARK 2009     Dr. Darrick Meigs     Allergies  Allergen Reactions   Celecoxib     REACTION: blood pressure went up   Codeine Itching   Crestor [Rosuvastatin Calcium]     Muscle cramping and achy   Fexofenadine Other (See Comments)    unknown   Penicillins Rash    Allergies as of 02/07/2023  Reactions   Celecoxib    REACTION: blood pressure went up   Codeine Itching   Crestor [rosuvastatin Calcium]    Muscle cramping and achy   Fexofenadine Other (See Comments)   unknown   Penicillins Rash        Medication List        Accurate as of February 07, 2023  9:50 AM. If you have any questions, ask your nurse or doctor.          amLODipine 10 MG tablet Commonly known as: NORVASC Take 1 tablet (10 mg total) by mouth daily.   aspirin EC 81 MG tablet Take 1 tablet (81 mg total) by mouth daily. What changed: Another medication with  the same name was removed. Continue taking this medication, and follow the directions you see here. Changed by: Donalee Citrin Garin Mata   cetirizine 10 MG tablet Commonly known as: ZYRTEC Take 1 tablet by mouth once daily   diclofenac 75 MG EC tablet Commonly known as: VOLTAREN Take 1 tablet (75 mg total) by mouth 2 (two) times daily.   diclofenac Sodium 1 % Gel Commonly known as: VOLTAREN Apply topically to right knee 4X/day as needed for pain   donepezil 10 MG tablet Commonly known as: ARICEPT TAKE 1 TABLET BY MOUTH AT BEDTIME   DSS 100 MG Caps Take 100 mg by mouth daily.   fluticasone 50 MCG/ACT nasal spray Commonly known as: FLONASE Use 2 spray(s) in each nostril once daily   lisinopril 40 MG tablet Commonly known as: ZESTRIL Take 1 tablet by mouth once daily   Naltrexone HCl (Pain) 1.5 MG Caps Take 1.5 mg by mouth daily after breakfast for 15 days, THEN 3 mg daily after breakfast. Start taking on: February 02, 2023   pregabalin 25 MG capsule Commonly known as: Lyrica Take 1 capsule (25 mg total) by mouth 2 (two) times daily.   Prevagen 10 MG Caps Generic drug: Apoaequorin Take 1 capsule by mouth daily.   sertraline 25 MG tablet Commonly known as: ZOLOFT Take 2 tablets by mouth once daily   tiZANidine 4 MG tablet Commonly known as: ZANAFLEX Take 1 tablet (4 mg total) by mouth 2 (two) times daily as needed.   Vitamin D3 125 MCG (5000 UT) Caps Take 1 capsule (5,000 Units total) by mouth every morning.        Review of Systems  Constitutional:  Negative for appetite change, chills, fatigue, fever and unexpected weight change.  HENT:  Positive for congestion. Negative for dental problem, ear discharge, ear pain, facial swelling, hearing loss, nosebleeds, postnasal drip, rhinorrhea, sinus pressure, sinus pain, sneezing, sore throat, tinnitus and trouble swallowing.   Eyes:  Negative for pain, discharge, redness, itching and visual disturbance.  Respiratory:   Negative for cough, chest tightness, shortness of breath and wheezing.   Cardiovascular:  Negative for chest pain, palpitations and leg swelling.  Gastrointestinal:  Negative for abdominal distention, abdominal pain, blood in stool, constipation, diarrhea, nausea and vomiting.  Endocrine: Negative for cold intolerance, heat intolerance, polydipsia, polyphagia and polyuria.  Genitourinary:  Negative for difficulty urinating, dysuria, flank pain, frequency and urgency.  Musculoskeletal:  Positive for arthralgias. Negative for back pain, gait problem, joint swelling, myalgias, neck pain and neck stiffness.  Skin:  Negative for color change, pallor, rash and wound.  Neurological:  Negative for dizziness, syncope, speech difficulty, weakness, light-headedness, numbness and headaches.  Hematological:  Does not bruise/bleed easily.  Psychiatric/Behavioral:  Negative for agitation, behavioral problems, confusion, hallucinations, self-injury,  sleep disturbance and suicidal ideas. The patient is not nervous/anxious.     Immunization History  Administered Date(s) Administered   Fluad Quad(high Dose 65+) 10/29/2018, 10/14/2020   Influenza, High Dose Seasonal PF 11/24/2017, 09/20/2022   Influenza,inj,Quad PF,6+ Mos 09/24/2015, 12/15/2016   Moderna Sars-Covid-2 Vaccination 03/29/2019, 04/26/2019, 11/23/2019, 12/17/2019   Pneumococcal Conjugate-13 12/16/2015   Pneumococcal Polysaccharide-23 06/27/2018, 07/19/2021   Td 01/17/2017   Td,absorbed, Preservative Free, Adult Use, Lf Unspecified 11/23/2010   Tdap 11/23/2010   Zoster Recombinant(Shingrix) 06/20/2022   Pertinent  Health Maintenance Due  Topic Date Due   Colonoscopy  10/17/2021   INFLUENZA VACCINE  Completed   DEXA SCAN  Completed      08/02/2021    2:45 PM 02/22/2022   12:48 PM 08/17/2022   12:56 PM 10/10/2022   11:11 AM 02/07/2023    9:29 AM  Fall Risk  Falls in the past year? 0 0 0 0   Was there an injury with Fall?  0 0  0  Fall Risk  Category Calculator  0 0    Patient at Risk for Falls Due to  No Fall Risks     Fall risk Follow up  Falls evaluation completed      Functional Status Survey:    Vitals:   02/07/23 0939  BP: 132/84  Pulse: 78  Resp: 18  Temp: 97.7 F (36.5 C)  SpO2: 96%  Weight: 171 lb 6.4 oz (77.7 kg)  Height: 5\' 9"  (1.753 m)   Body mass index is 25.31 kg/m. Physical Exam  HEENT: Left ear canal obstructed with cerumen, preventing visualization of the eardrum. Right ear canal clear, eardrum visible with no signs of infection. Nasal discharge present, characterized by a strip of blood on the nasal septum. Oral cavity without erythema or exudates, dentition well-maintained. CHEST: Lung fields clear to auscultation bilaterally, no wheezes, crackles, or rhonchi. CARDIOVASCULAR: Heart auscultation reveals normal rate and rhythm, no murmurs, rubs, or gallops. MUSCULOSKELETAL: No tenderness upon palpation of the forehead or sinuses, specifically noted on the left side. No enlarged lymph nodes palpated. PSYCHIATRY/BEHAVIORAL: Mood stable     Labs reviewed: Recent Labs    02/22/22 1331  NA 142  K 4.1  CL 106  CO2 25  GLUCOSE 74  BUN 11  CREATININE 0.81  CALCIUM 9.7   No results for input(s): "AST", "ALT", "ALKPHOS", "BILITOT", "PROT", "ALBUMIN" in the last 8760 hours. Recent Labs    02/22/22 1331  WBC 7.4  NEUTROABS 5,032  HGB 11.8  HCT 37.7  MCV 76.2*  PLT 375   Lab Results  Component Value Date   TSH 1.00 07/19/2021   Lab Results  Component Value Date   HGBA1C 5.9 (H) 07/19/2021   Lab Results  Component Value Date   CHOL 215 (H) 07/19/2021   HDL 52 07/19/2021   LDLCALC 142 (H) 07/19/2021   TRIG 101 07/19/2021   CHOLHDL 4.1 07/19/2021    Significant Diagnostic Results in last 30 days:  No results found.  Assessment/Plan  Chronic Pain Management Experiencing chronic shoulder and knee pain, with a history of toe surgery. Currently on tizanidine 4 mg twice daily as  needed and Lyrica 25 mg for pain. Reports issues with medication refills and confusion about prescriptions. Scheduled for a knee injection next Wednesday. Emphasized managing medications through one provider to avoid complications. - Call pain management clinic to refill tizanidine and naloxone prescriptions - Continue Lyrica 25 mg for pain - Follow up with pain management clinic for  knee injection next Wednesday  Hypertension On lisinopril 40 mg daily and amlodipine 10 mg daily. Reports generally good home blood pressure readings. No recent headaches or dizziness. Emphasized regular blood pressure monitoring and maintaining current medication regimen. - Continue lisinopril 40 mg daily - Continue amlodipine 10 mg daily - Monitor blood pressure at home - TSH - COMPLETE METABOLIC PANEL WITH GFR - CBC with Differential/Platelet  Hyperlipidemia Previous cholesterol levels: total cholesterol 215 mg/dL, LDL 132 mg/dL. Advised dietary modifications to reduce cholesterol and cardiovascular risk. - Order lipid panel to recheck cholesterol levels - Continue dietary modifications to reduce cholesterol - Lipid panel  Prediabetes  Previous A1c 5.9%, indicating prediabetes. Family history of diabetes. Advised to monitor carbohydrate intake and limit sweets. Emphasized regular monitoring and dietary management to prevent progression to diabetes. - Order A1c test to monitor diabetes status - Continue to limit intake of sweets and high-carbohydrate foods - Hemoglobin A1c  Depression Taking Zoloft 50 mg daily. Reports no current issues with depression but has situational stress related to family health issues. Emphasized continuing medication and monitoring mood changes. - Continue Zoloft 50 mg daily - TSH  Chronic Sinusitis Reports sinus congestion and drainage, especially at night. Currently using Flonase nasal spray and Zyrtec. Discussed using a humidifier to alleviate symptoms. - Continue Flonase  nasal spray - Continue Zyrtec for allergy symptoms - Consider using a humidifier to moisten the air   Impacted cerumen of left ear Left ear cerumen lavaged with warm water and hydrogen peroxide moderate amounts of cerumen obtained.Tolerated procedure well.TM clear without any signs of infection.  General Health Maintenance Up to date with flu vaccination but has not received the COVID-19 booster due to insurance issues. Reports good bowel movements and adherence to dietary recommendations. Emphasized staying hydrated and considering sugar-free alternatives to ice cream. - Check immunization records for COVID-19 booster - Encourage continued healthy diet and hydration - Recommend sugar-free alternatives to ice cream, such as Jell-O or fruit  Follow-up - Order lab work including lipid panel, TSH, CMP, CBC, and A1c - Schedule next six-month follow-up appointment - Flush left ear for cerumen impaction.    Family/ staff Communication: Reviewed plan of care with patient  Labs/tests ordered:  - TSH - COMPLETE METABOLIC PANEL WITH GFR - CBC with Differential/Platelet - Hemoglobin A1c - Lipid panel  Next Appointment : Return in about 6 months (around 08/07/2023) for medical mangement of chronic issues.Caesar Bookman, NP

## 2023-02-08 LAB — HEMOGLOBIN A1C
Hgb A1c MFr Bld: 6.2 %{Hb} — ABNORMAL HIGH (ref <5.7)
Mean Plasma Glucose: 131 mg/dL
eAG (mmol/L): 7.3 mmol/L

## 2023-02-08 LAB — COMPLETE METABOLIC PANEL WITH GFR
AG Ratio: 1.7 (calc) (ref 1.0–2.5)
ALT: 12 U/L (ref 6–29)
AST: 18 U/L (ref 10–35)
Albumin: 4.5 g/dL (ref 3.6–5.1)
Alkaline phosphatase (APISO): 82 U/L (ref 37–153)
BUN: 9 mg/dL (ref 7–25)
CO2: 26 mmol/L (ref 20–32)
Calcium: 9.7 mg/dL (ref 8.6–10.4)
Chloride: 103 mmol/L (ref 98–110)
Creat: 0.78 mg/dL (ref 0.60–1.00)
Globulin: 2.7 g/dL (ref 1.9–3.7)
Glucose, Bld: 86 mg/dL (ref 65–99)
Potassium: 3.9 mmol/L (ref 3.5–5.3)
Sodium: 142 mmol/L (ref 135–146)
Total Bilirubin: 0.5 mg/dL (ref 0.2–1.2)
Total Protein: 7.2 g/dL (ref 6.1–8.1)
eGFR: 78 mL/min/{1.73_m2} (ref >=60)

## 2023-02-08 LAB — CBC WITH DIFFERENTIAL/PLATELET
Absolute Lymphocytes: 1600 {cells}/uL (ref 850–3900)
Absolute Monocytes: 341 {cells}/uL (ref 200–950)
Basophils Absolute: 50 {cells}/uL (ref 0–200)
Basophils Relative: 0.8 %
Eosinophils Absolute: 211 {cells}/uL (ref 15–500)
Eosinophils Relative: 3.4 %
HCT: 40.4 % (ref 35.0–45.0)
Hemoglobin: 12.6 g/dL (ref 11.7–15.5)
MCH: 23.8 pg — ABNORMAL LOW (ref 27.0–33.0)
MCHC: 31.2 g/dL — ABNORMAL LOW (ref 32.0–36.0)
MCV: 76.2 fL — ABNORMAL LOW (ref 80.0–100.0)
MPV: 10.9 fL (ref 7.5–12.5)
Monocytes Relative: 5.5 %
Neutro Abs: 3999 {cells}/uL (ref 1500–7800)
Neutrophils Relative %: 64.5 %
Platelets: 337 10*3/uL (ref 140–400)
RBC: 5.3 10*6/uL — ABNORMAL HIGH (ref 3.80–5.10)
RDW: 15.2 % — ABNORMAL HIGH (ref 11.0–15.0)
Total Lymphocyte: 25.8 %
WBC: 6.2 10*3/uL (ref 3.8–10.8)

## 2023-02-08 LAB — LIPID PANEL
Cholesterol: 261 mg/dL — ABNORMAL HIGH (ref <200)
HDL: 66 mg/dL (ref >=50)
LDL Cholesterol (Calc): 169 mg/dL — ABNORMAL HIGH
Non-HDL Cholesterol (Calc): 195 mg/dL — ABNORMAL HIGH (ref <130)
Total CHOL/HDL Ratio: 4 (calc) (ref <5.0)
Triglycerides: 126 mg/dL (ref <150)

## 2023-02-08 LAB — TSH: TSH: 1.38 m[IU]/L (ref 0.40–4.50)

## 2023-02-15 ENCOUNTER — Telehealth: Payer: Self-pay

## 2023-02-15 ENCOUNTER — Encounter: Payer: Self-pay | Admitting: Student in an Organized Health Care Education/Training Program

## 2023-02-15 ENCOUNTER — Ambulatory Visit
Payer: Medicare Other | Attending: Student in an Organized Health Care Education/Training Program | Admitting: Student in an Organized Health Care Education/Training Program

## 2023-02-15 DIAGNOSIS — M1711 Unilateral primary osteoarthritis, right knee: Secondary | ICD-10-CM | POA: Diagnosis not present

## 2023-02-15 MED ORDER — TRIAMCINOLONE ACETONIDE 32 MG IX SRER
32.0000 mg | Freq: Once | INTRA_ARTICULAR | Status: AC
Start: 2023-02-15 — End: 2023-02-15
  Administered 2023-02-15: 32 mg via INTRA_ARTICULAR
  Filled 2023-02-15: qty 5

## 2023-02-15 MED ORDER — LIDOCAINE HCL 2 % IJ SOLN
20.0000 mL | Freq: Once | INTRAMUSCULAR | Status: AC
  Administered 2023-02-15: 400 mg
  Filled 2023-02-15: qty 40

## 2023-02-15 NOTE — Telephone Encounter (Signed)
 Noted.

## 2023-02-15 NOTE — Patient Instructions (Signed)

## 2023-02-15 NOTE — Telephone Encounter (Signed)
 Labs already resulted.Please call patient with results.

## 2023-02-15 NOTE — Progress Notes (Signed)
 Safety precautions to be maintained throughout the outpatient stay will include: orient to surroundings, keep bed in low position, maintain call bell within reach at all times, provide assistance with transfer out of bed and ambulation.

## 2023-02-15 NOTE — Progress Notes (Signed)
 PROVIDER NOTE: Interpretation of information contained herein should be left to medically-trained personnel. Specific patient instructions are provided elsewhere under Patient Instructions section of medical record. This document was created in part using STT-dictation technology, any transcriptional errors that may result from this process are unintentional.  Patient: Kaylee Warner Type: Established DOB: 10-02-1945 MRN: [id number] PCP: Leonarda Roxan JAYSON, NP  Service: Procedure DOS: 02/15/2023 Setting: Ambulatory Location: Ambulatory outpatient facility Delivery: Face-to-face Provider: Wallie Sherry, MD Specialty: Interventional Pain Management Specialty designation: 09 Location: Outpatient facility Ref. Prov.: Ngetich, Roxan JAYSON, NP    Primary Reason for Visit: Interventional Pain Management Treatment. CC: Knee Pain (right)   Procedure:           Type: Steroid Intra-articular Knee Injection (ER Triamcinolone ) Laterality: Right (-RT) Level/approach: Medial Imaging guidance: None required (CPT-20610) Anesthesia: Local anesthesia (1-2% Lidocaine ) Anxiolysis: None                 Sedation: None.  Purpose: Diagnostic/Therapeutic Indications: Knee arthralgia associated to osteoarthritis of the knee 1. Primary osteoarthritis of right knee    NAS-11 score:   Pre-procedure: 10-Worst pain ever/10   Post-procedure: 1 /10     Pre-Procedure Preparation  Monitoring: As per clinic protocol.  Risk Assessment: Vitals:  AFP:Zdupfjuzi body mass index is 25.1 kg/m as calculated from the following:   Height as of this encounter: 5' 9 (1.753 m).   Weight as of this encounter: 170 lb (77.1 kg)., Rate:74 , BP:133/83, Resp: , Temp: , SpO2:99 %  Allergies: She is allergic to celecoxib, codeine, crestor  [rosuvastatin  calcium ], fexofenadine, and penicillins.  Precautions: No additional precautions required  Blood-thinner(s): None at this time  Coagulopathies: Reviewed. None identified.   Active  Infection(s): Reviewed. None identified. Kaylee Warner is afebrile   Location setting: Exam room Position: Sitting w/ knee bent 90 degrees Safety Precautions: Patient was assessed for positional comfort and pressure points before starting the procedure. Prepping solution: DuraPrep (Iodine  Povacrylex [0.7% available iodine ] and Isopropyl Alcohol, 74% w/w) Prep Area: Entire knee region Approach: percutaneous, just above the tibial plateau, medial to the infrapatellar tendon. Intended target: Intra-articular knee space Materials: Tray: Block Needle(s): Regular Qty: 1/side Length: 1.5-inch Gauge: 25G  5 cc solution of triamcinolone  32 mg was injected into the right knee.    Meds ordered this encounter  Medications   lidocaine  (XYLOCAINE ) 2 % (with pres) injection 400 mg   Triamcinolone  Acetonide (ZILRETTA ) intra-articular injection 32 mg    Maintain refrigerated.  Prepared suspension may be stored up to 4 hours at ambient conditions.    No orders of the defined types were placed in this encounter.    Time-out: 1417 I initiated and conducted the Time-out before starting the procedure, as per protocol. The patient was asked to participate by confirming the accuracy of the Time Out information. Verification of the correct person, site, and procedure were performed and confirmed by me, the nursing staff, and the patient. Time-out conducted as per Joint Commission's Universal Protocol (UP.01.01.01). Procedure checklist: Completed   H&P (Pre-op  Assessment)  Kaylee Warner is a 78 y.o. (year old), female patient, seen today for interventional treatment. She  has a past surgical history that includes SARK 2009 and Colonoscopy. [name] has a current medication list which includes the following prescription(s): amlodipine , prevagen, aspirin  ec, cetirizine , vitamin d3, diclofenac  sodium, dss, donepezil , fluticasone , lisinopril , naltrexone  hcl (pain), pregabalin , sertraline , and tizanidine . Her  primarily concern today is the Knee Pain (right)  She is allergic to celecoxib, codeine,  crestor  [rosuvastatin  calcium ], fexofenadine, and penicillins.   Last encounter: My last encounter with her was on 04/06/2021. Pertinent problems: Kaylee Warner has Rotator cuff syndrome of right shoulder; Chronic pain syndrome; Depression; Right rotator cuff tear arthropathy; Spinal stenosis, lumbar region, with neurogenic claudication; Lumbar facet arthropathy; Lumbar degenerative disc disease; and Chronic radicular lumbar pain on their pertinent problem list. Pain Assessment: Severity of Chronic pain is reported as a 10-Worst pain ever/10. Location: Knee Right/radiates up thigh. Onset: More than a month ago. Quality: Throbbing, Pins and needles, Shooting. Timing: Constant. Modifying factor(s): heat, ice. Vitals:  height is 5' 9 (1.753 m) and weight is 170 lb (77.1 kg). Her blood pressure is 133/83 and her pulse is 74. Her oxygen saturation is 99%.   Reason for encounter: interventional pain management therapy due pain of at least four (4) weeks in duration, with failure to respond and/or inability to tolerate more conservative care.   Site Confirmation: Kaylee Warner was asked to confirm the procedure and laterality before marking the site.  Consent: Before the procedure and under the influence of no sedative(s), amnesic(s), or anxiolytics, the patient was informed of the treatment options, risks and possible complications. To fulfill our ethical and legal obligations, as recommended by the American Medical Association's Code of Ethics, I have informed the patient of my clinical impression; the nature and purpose of the treatment or procedure; the risks, benefits, and possible complications of the intervention; the alternatives, including doing nothing; the risk(s) and benefit(s) of the alternative treatment(s) or procedure(s); and the risk(s) and benefit(s) of doing nothing. The patient was provided information about  the general risks and possible complications associated with the procedure. These may include, but are not limited to: failure to achieve desired goals, infection, bleeding, organ or nerve damage, allergic reactions, paralysis, and death. In addition, the patient was informed of those risks and complications associated to Spine-related procedures, such as failure to decrease pain; infection (i.e.: Meningitis, epidural or intraspinal abscess); bleeding (i.e.: epidural hematoma, subarachnoid hemorrhage, or any other type of intraspinal or peri-dural bleeding); organ or nerve damage (i.e.: Any type of peripheral nerve, nerve root, or spinal cord injury) with subsequent damage to sensory, motor, and/or autonomic systems, resulting in permanent pain, numbness, and/or weakness of one or several areas of the body; allergic reactions; (i.e.: anaphylactic reaction); and/or death. Furthermore, the patient was informed of those risks and complications associated with the medications. These include, but are not limited to: allergic reactions (i.e.: anaphylactic or anaphylactoid reaction(s)); adrenal axis suppression; blood sugar elevation that in diabetics may result in ketoacidosis or comma; water  retention that in patients with history of congestive heart failure may result in shortness of breath, pulmonary edema, and decompensation with resultant heart failure; weight gain; swelling or edema; medication-induced neural toxicity; particulate matter embolism and blood vessel occlusion with resultant organ, and/or nervous system infarction; and/or aseptic necrosis of one or more joints. Finally, the patient was informed that Medicine is not an exact science; therefore, there is also the possibility of unforeseen or unpredictable risks and/or possible complications that may result in a catastrophic outcome. The patient indicated having understood very clearly. We have given the patient no guarantees and we have made no  promises. Enough time was given to the patient to ask questions, all of which were answered to the patient's satisfaction. Ms. Kawamoto has indicated that she wanted to continue with the procedure. Attestation: I, the ordering provider, attest that I have discussed with the patient the benefits, risks,  side-effects, alternatives, likelihood of achieving goals, and potential problems during recovery for the procedure that I have provided informed consent.  Date  Time: 02/15/2023  1:42 PM    Description of procedure   Start Time: 1417 hrs  Local Anesthesia: Once the patient was positioned, prepped, and time-out was completed. The target area was identified located. The skin was marked with an approved surgical skin marker. Once marked, the skin (epidermis, dermis, and hypodermis), and deeper tissues (fat, connective tissue and muscle) were infiltrated with a small amount of a short-acting local anesthetic, loaded on a 10cc syringe with a 25G, 1.5-in  Needle. An appropriate amount of time was allowed for local anesthetics to take effect before proceeding to the next step. Local Anesthetic: Lidocaine  1-2% The unused portion of the local anesthetic was discarded in the proper designated containers. Safety Precautions: Aspiration looking for blood return was conducted prior to all injections. At no point did I inject any substances, as a needle was being advanced. Before injecting, the patient was told to immediately notify me if she was experiencing any new onset of ringing in the ears, or metallic taste in the mouth. No attempts were made at seeking any paresthesias. Safe injection practices and needle disposal techniques used. Medications properly checked for expiration dates. SDV (single dose vial) medications used. After the completion of the procedure, all disposable equipment used was discarded in the proper designated medical waste containers.  Technical description: Protocol guidelines were followed.  After positioning, the target area was identified and prepped in the usual manner. Skin & deeper tissues infiltrated with local anesthetic. Appropriate amount of time allowed to pass for local anesthetics to take effect. Proper needle placement secured. Once satisfactory needle placement was confirmed, I proceeded to inject the desired solution in slow, incremental fashion, intermittently assessing for discomfort or any signs of abnormal or undesired spread of substance. Once completed, the needle was removed and disposed of, as per hospital protocols. The area was cleaned, making sure to leave some of the prepping solution back to take advantage of its long term bactericidal properties.  Aspiration:  Negative   Vitals:   02/15/23 1345 02/15/23 1346  BP:  133/83  Pulse:  74  SpO2:  99%  Weight: 170 lb (77.1 kg)   Height: 5' 9 (1.753 m)     End Time: 1422 hrs   Imaging guidance  Imaging-assisted Technique: None required. Indication(s): N/A Exposure Time: N/A Contrast: None Fluoroscopic Guidance: N/A Ultrasound Guidance: N/A Interpretation: N/A   Post-op assessment  Post-procedure Vital Signs:  Pulse/HCG Rate: 74  Temp:   Resp:   BP: 133/83 SpO2: 99 %  EBL: None  Complications: No immediate post-treatment complications observed by team, or reported by patient.  Note: The patient tolerated the entire procedure well. A repeat set of vitals were taken after the procedure and the patient was kept under observation following institutional policy, for this type of procedure. Post-procedural neurological assessment was performed, showing return to baseline, prior to discharge. The patient was provided with post-procedure discharge instructions, including a section on how to identify potential problems. Should any problems arise concerning this procedure, the patient was given instructions to immediately contact us , at any time, without hesitation. In any case, we plan to contact the  patient by telephone for a follow-up status report regarding this interventional procedure.  Comments:  No additional relevant information.   Plan of care    Medications administered: We administered lidocaine  and Triamcinolone  Acetonide.  Follow-up plan:  Return in about 4 weeks (around 03/15/2023), or PPE F2F.     Recent Visits Date Type Provider Dept  02/02/23 Office Visit Marcelino Nurse, MD [facility]  Showing recent visits within past 90 days and meeting all other requirements Today's Visits Date Type Provider Dept  02/15/23 Procedure visit Marcelino Nurse, MD [facility]  Showing today's visits and meeting all other requirements Future Appointments Date Type Provider Dept  03/22/23 Appointment Marcelino Nurse, MD [facility]  Showing future appointments within next 90 days and meeting all other requirements   Disposition: Discharge home  Discharge (Date  Time): 02/15/2023; 1423 hrs.   Primary Care Physician: Leonarda Roxan BROCKS, NP Location: [facility] Note by: Nurse Marcelino, MD Date: 02/15/2023; Time: 3:13 PM  DISCLAIMER: Medicine is not an exact science. It has no guarantees or warranties. The decision to proceed with this intervention was based on the information collected from the patient. Conclusions were drawn from the patient's questionnaire, interview, and examination. Because information was provided in large part by the patient, it cannot be guaranteed that it has not been purposely or unconsciously manipulated or altered. Every effort has been made to obtain as much accurate, relevant, available data as possible. Always take into account that the treatment will also be dependent on availability of resources and existing treatment guidelines, considered by other Pain Management Specialists as being common knowledge and practice, at the time of the intervention. It is also important to point out that variation in  procedural techniques and pharmacological choices are the acceptable norm. For Medico-Legal review purposes, the indications, contraindications, technique, and results of the these procedures should only be evaluated, judged and interpreted by a Board-Certified Interventional Pain Specialist with extensive familiarity and expertise in the same exact procedure and technique.

## 2023-02-15 NOTE — Telephone Encounter (Signed)
 Left message on voicemail for patient to return call when available

## 2023-02-15 NOTE — Telephone Encounter (Signed)
 Patient left a voicemail on this morning stating that she has heard anything about her lab results from Ngetich, Elijio [place], NP.  Please Advise  Message sent to Ngetich, Dinah C, NP

## 2023-02-16 ENCOUNTER — Telehealth: Payer: Self-pay

## 2023-02-16 NOTE — Telephone Encounter (Signed)
 The patient called back to let you know she is doing so much better today. The pain is a 5 but she just got up. No problems or issues.

## 2023-02-16 NOTE — Telephone Encounter (Signed)
 Post procedure follow up.  LM

## 2023-02-21 ENCOUNTER — Encounter: Payer: Self-pay | Admitting: Family

## 2023-02-21 ENCOUNTER — Ambulatory Visit (INDEPENDENT_AMBULATORY_CARE_PROVIDER_SITE_OTHER): Payer: Medicare Other | Admitting: Family

## 2023-02-21 VITALS — BP 130/80 | HR 78 | Temp 97.8°F | Resp 18 | Ht 69.0 in | Wt 172.2 lb

## 2023-02-21 DIAGNOSIS — R7303 Prediabetes: Secondary | ICD-10-CM

## 2023-02-21 DIAGNOSIS — D509 Iron deficiency anemia, unspecified: Secondary | ICD-10-CM

## 2023-02-21 DIAGNOSIS — E782 Mixed hyperlipidemia: Secondary | ICD-10-CM | POA: Diagnosis not present

## 2023-02-21 DIAGNOSIS — M1711 Unilateral primary osteoarthritis, right knee: Secondary | ICD-10-CM

## 2023-02-21 DIAGNOSIS — R252 Cramp and spasm: Secondary | ICD-10-CM

## 2023-02-21 NOTE — Progress Notes (Signed)
Provider: Richarda Blade FNP-C  Raysean Graumann, Donalee Citrin, NP  Patient Care Team: Tyvon Eggenberger, Donalee Citrin, NP as PCP - General (Family Medicine)  Extended Emergency Contact Information Primary Emergency Contact: Ellan Lambert [place] Mobile Phone: [phone] Relation: Son Secondary Emergency Contact: Overbaugh,James R Address: [address]          [place], [place] [address] [place] Home Phone: [phone] Relation: Spouse  Code Status: Full code  Goals of care: Advanced Directive information    02/21/2023    8:55 AM  Advanced Directives  Does Patient Have a Medical Advance Directive? Yes  Type of Estate agent of [place];Living will  Does patient want to make changes to medical advance directive? No - Patient declined  Copy of Healthcare Power of Attorney in Chart? No - copy requested     Chief Complaint  Patient presents with   Acute Visit    Pt c/o leg cramps and want to discuss cholesterol.     Discussed the use of AI scribe software for clinical note transcription with the patient, who gave verbal consent to proceed.  History of Present Illness   Surie C Borowski "Georgeann Oppenheim" is a 78 year old female who presents for discussion of lab results and management of symptoms.  She experiences persistent leg cramps, particularly at night when lying down. The cramps are described as aching and occur in both legs. She uses heat and ice for relief and takes over-the-counter cramp pills and mustard, which she finds helpful. She recently resumed taking magnesium and B12 supplements, starting last Friday, to see if they would improve her symptoms. She has a history of arthritis, which she believes contributes to her leg discomfort. She mentions having fluid removed from her right knee last week at a pain clinic, which has been a source of significant trouble for her. She also notes pain in her lower back and fluid retention at night, which exacerbates  her discomfort.  She is concerned about her cholesterol levels, which have increased since last year. Her total cholesterol has risen from 215 to 261, and her LDL has increased from 142 to 169. She has previously taken statins but experienced muscle cramps with all except one, which she took every three days. She consumes protein shakes for breakfast and is aware of dietary factors affecting her cholesterol.  She is monitoring her blood sugar levels, as her A1c has increased from 5.9 to 6.2. She is mindful of her diet, particularly her intake of sugary foods and fruits high in sugar, such as oranges and bananas. She has been eating apples regularly and is considering dietary changes to manage her blood sugar.  She reports waking up three times at night to urinate and also urinates about three times during the day. She feels she empties her bladder completely each time and does not experience burning during urination. She tries to stay hydrated by drinking plenty of water but is considering reducing her intake in the evening to decrease nighttime urination.  She has been taking care of her sister, who has cancer, and another sister who recently had an accident. This caregiving responsibility has impacted her ability to focus on her own health.         Past Medical History:  Diagnosis Date   Allergy    Anemia    Arthritis    Hypertension    Past Surgical History:  Procedure Laterality Date   COLONOSCOPY     SARK  2009     Dr. Darrick Meigs     Allergies  Allergen Reactions   Celecoxib     REACTION: blood pressure went up   Codeine Itching   Crestor [Rosuvastatin Calcium]     Muscle cramping and achy   Fexofenadine Other (See Comments)    unknown   Penicillins Rash    Outpatient Encounter Medications as of 02/21/2023  Medication Sig   amLODipine (NORVASC) 10 MG tablet Take 1 tablet (10 mg total) by mouth daily.   Apoaequorin (PREVAGEN) 10 MG CAPS Take 1 capsule by mouth daily.    aspirin EC 81 MG tablet Take 1 tablet (81 mg total) by mouth daily.   cetirizine (ZYRTEC) 10 MG tablet Take 1 tablet by mouth once daily   Cholecalciferol (VITAMIN D3) 125 MCG (5000 UT) capsule Take 1 capsule (5,000 Units total) by mouth every morning.   diclofenac Sodium (VOLTAREN) 1 % GEL Apply topically to right knee 4X/day as needed for pain   donepezil (ARICEPT) 10 MG tablet TAKE 1 TABLET BY MOUTH AT BEDTIME   fluticasone (FLONASE) 50 MCG/ACT nasal spray Use 2 spray(s) in each nostril once daily   lisinopril (ZESTRIL) 40 MG tablet Take 1 tablet by mouth once daily   pregabalin (LYRICA) 25 MG capsule Take 1 capsule (25 mg total) by mouth 2 (two) times daily.   sertraline (ZOLOFT) 25 MG tablet Take 2 tablets by mouth once daily   tiZANidine (ZANAFLEX) 4 MG tablet Take 1 tablet (4 mg total) by mouth 2 (two) times daily as needed.   Docusate Sodium (DSS) 100 MG CAPS Take 100 mg by mouth daily. (Patient not taking: Reported on 02/21/2023)   Naltrexone HCl, Pain, 1.5 MG CAPS Take 1.5 mg by mouth daily after breakfast for 15 days, THEN 3 mg daily after breakfast. (Patient not taking: Reported on 02/21/2023)   No facility-administered encounter medications on file as of 02/21/2023.    Review of Systems  Constitutional:  Negative for appetite change, chills, fatigue, fever and unexpected weight change.  HENT:  Negative for congestion, ear discharge, ear pain, hearing loss, nosebleeds, postnasal drip, rhinorrhea, sinus pressure, sinus pain, sneezing and sore throat.   Eyes:  Negative for pain, discharge, redness, itching and visual disturbance.  Respiratory:  Negative for cough, chest tightness, shortness of breath and wheezing.   Cardiovascular:  Negative for chest pain, palpitations and leg swelling.  Gastrointestinal:  Negative for abdominal distention, abdominal pain, diarrhea, nausea and vomiting.  Genitourinary:  Negative for difficulty urinating, dysuria, flank pain, frequency and urgency.   Musculoskeletal:  Positive for arthralgias. Negative for back pain, gait problem, joint swelling, myalgias, neck pain and neck stiffness.       Leg cramps at night   Skin:  Negative for color change, pallor, rash and wound.  Neurological:  Negative for dizziness, weakness, light-headedness, numbness and headaches.    Immunization History  Administered Date(s) Administered   Fluad Quad(high Dose 65+) 10/29/2018, 10/14/2020   Influenza, High Dose Seasonal PF 11/24/2017, 09/20/2022   Influenza,inj,Quad PF,6+ Mos 09/24/2015, 12/15/2016   Moderna Sars-Covid-2 Vaccination 03/29/2019, 04/26/2019, 11/23/2019, 12/17/2019   Pneumococcal Conjugate-13 12/16/2015   Pneumococcal Polysaccharide-23 06/27/2018, 07/19/2021   Td 01/17/2017   Td,absorbed, Preservative Free, Adult Use, Lf Unspecified 11/23/2010   Tdap 11/23/2010   Zoster Recombinant(Shingrix) 06/20/2022   Pertinent  Health Maintenance Due  Topic Date Due   Colonoscopy  10/17/2021   INFLUENZA VACCINE  Completed   DEXA SCAN  Completed      08/17/2022  12:56 PM 10/10/2022   11:11 AM 02/07/2023    9:29 AM 02/15/2023    1:46 PM 02/21/2023    8:55 AM  Fall Risk  Falls in the past year? 0 0  0 0  Was there an injury with Fall? 0  0  0  Fall Risk Category Calculator 0    0   Functional Status Survey:    Vitals:   02/21/23 0859  BP: 130/80  Pulse: 78  Resp: 18  Temp: 97.8 F (36.6 C)  SpO2: 96%  Weight: 172 lb 3.2 oz (78.1 kg)  Height: 5\' 9"  (1.753 m)   Body mass index is 25.43 kg/m. Physical Exam Vitals reviewed.  Constitutional:      General: She is not in acute distress.    Appearance: Normal appearance. She is overweight. She is not ill-appearing or diaphoretic.  HENT:     Head: Normocephalic.     Mouth/Throat:     Mouth: Mucous membranes are moist.     Pharynx: Oropharynx is clear. No oropharyngeal exudate or posterior oropharyngeal erythema.  Eyes:     General: No scleral icterus.       Right eye: No discharge.         Left eye: No discharge.     Conjunctiva/sclera: Conjunctivae normal.     Pupils: Pupils are equal, round, and reactive to light.  Neck:     Vascular: No carotid bruit.  Cardiovascular:     Rate and Rhythm: Normal rate and regular rhythm.     Pulses: Normal pulses.     Heart sounds: Normal heart sounds. No murmur heard.    No friction rub. No gallop.  Pulmonary:     Effort: Pulmonary effort is normal. No respiratory distress.     Breath sounds: Normal breath sounds. No wheezing, rhonchi or rales.  Chest:     Chest wall: No tenderness.  Abdominal:     General: Bowel sounds are normal. There is no distension.     Palpations: Abdomen is soft. There is no mass.     Tenderness: There is no abdominal tenderness. There is no right CVA tenderness, left CVA tenderness, guarding or rebound.  Musculoskeletal:        General: No swelling or tenderness. Normal range of motion.     Cervical back: Normal range of motion. No rigidity or tenderness.     Right lower leg: No edema.     Left lower leg: No edema.  Lymphadenopathy:     Cervical: No cervical adenopathy.  Skin:    General: Skin is warm and dry.     Coloration: Skin is not pale.     Findings: No bruising, erythema or rash.  Neurological:     Mental Status: She is alert and oriented to person, place, and time.     Motor: No weakness.     Gait: Gait normal.  Psychiatric:        Mood and Affect: Mood normal.        Speech: Speech normal.        Behavior: Behavior normal.     Labs reviewed: Recent Labs    02/22/22 1331 02/07/23 1100  NA 142 142  K 4.1 3.9  CL 106 103  CO2 25 26  GLUCOSE 74 86  BUN 11 9  CREATININE 0.81 0.78  CALCIUM 9.7 9.7   Recent Labs    02/07/23 1100  AST 18  ALT 12  BILITOT 0.5  PROT 7.2  Recent Labs    02/22/22 1331 02/07/23 1100  WBC 7.4 6.2  NEUTROABS 5,032 3,999  HGB 11.8 12.6  HCT 37.7 40.4  MCV 76.2* 76.2*  PLT 375 337   Lab Results  Component Value Date   TSH 1.38  02/07/2023   Lab Results  Component Value Date   HGBA1C 6.2 (H) 02/07/2023   Lab Results  Component Value Date   CHOL 261 (H) 02/07/2023   HDL 66 02/07/2023   LDLCALC 169 (H) 02/07/2023   TRIG 126 02/07/2023   CHOLHDL 4.0 02/07/2023    Significant Diagnostic Results in last 30 days:  No results found.  Assessment/Plan  Leg Cramps Chronic nocturnal leg cramps. Suspected magnesium deficiency. Discussed magnesium supplementation benefits. - Order magnesium level test - Consider magnesium supplementation if levels are low - Magnesium  Osteoarthritis Chronic right knee pain and swelling, with generalized body aches and stiffness, especially in the lower back. Discussed water aerobics, warm water therapy, and regular exercise for joint flexibility and pain relief. - Recommend water aerobics - Encourage regular exercise, such as walking or treadmill use - Discuss warm [facility] therapy at the [facility]  Hyperlipidemia Elevated cholesterol (total 261 mg/dL, LDL 595 mg/dL). Previous statin therapy discontinued due to muscle cramps. Discussed dietary modifications, increased physical activity, and potential cardiology referral for cholesterol-lowering injections. Explained risks of high LDL, including heart attack and stroke, and importance of maintaining LDL <100 mg/dL. - Recommend dietary modifications to reduce intake of starchy, deep-fried, and high-cholesterol foods - Encourage increased physical activity - Discuss potential cardiology referral for cholesterol-lowering injections  Prediabetes Hemoglobin A1c increased from 5.9% to 6.2%. Discussed reducing sugary foods and drinks, moderating high-sugar fruits, and regular physical activity to manage blood sugar levels. - Advise reduction of sugary foods and drinks - Encourage moderation in consumption of high-sugar fruits - Recommend regular physical activity  Iron Deficiency Anemia Low hemoglobin indices managed with iron supplements.  Reports constipation as a side effect. Discussed continuing iron supplementation with a stool softener. - Continue iron supplementation with a stool softener - Recommend taking iron with Miralax or Docusate (Colace)  General Health Maintenance Blood pressure well-controlled at 130/80 mmHg. Normal kidney function, electrolytes, and thyroid levels. Bone density normal in 2018. Discussed hydration, balanced diet, and reducing late evening water intake to prevent nocturia. - Encourage hydration and balanced diet with vegetables and fruits - Advise against late evening water intake  Follow-up - Recheck lab work on July 18, 2023 - Follow-up appointment on August 08, 2023.   Family/ staff Communication: Reviewed plan of care with patient verbalized understanding   Labs/tests ordered: None   Next Appointment: Return if symptoms worsen or fail to improve.   Caesar Bookman, NP

## 2023-02-22 LAB — MAGNESIUM: Magnesium: 2.1 mg/dL (ref 1.5–2.5)

## 2023-02-23 ENCOUNTER — Other Ambulatory Visit: Payer: Self-pay | Admitting: Student in an Organized Health Care Education/Training Program

## 2023-02-23 DIAGNOSIS — M542 Cervicalgia: Secondary | ICD-10-CM

## 2023-02-23 DIAGNOSIS — G894 Chronic pain syndrome: Secondary | ICD-10-CM

## 2023-02-27 ENCOUNTER — Other Ambulatory Visit: Payer: Self-pay | Admitting: *Deleted

## 2023-02-27 ENCOUNTER — Telehealth: Payer: Self-pay | Admitting: Student in an Organized Health Care Education/Training Program

## 2023-02-27 DIAGNOSIS — G894 Chronic pain syndrome: Secondary | ICD-10-CM

## 2023-02-27 DIAGNOSIS — M542 Cervicalgia: Secondary | ICD-10-CM

## 2023-02-27 NOTE — Telephone Encounter (Signed)
Patient needs pregabalin refilled before he next visit.

## 2023-02-27 NOTE — Telephone Encounter (Signed)
Rx request sent to Dr. Lateef 

## 2023-03-02 ENCOUNTER — Other Ambulatory Visit: Payer: Self-pay | Admitting: *Deleted

## 2023-03-02 DIAGNOSIS — M542 Cervicalgia: Secondary | ICD-10-CM

## 2023-03-02 DIAGNOSIS — G894 Chronic pain syndrome: Secondary | ICD-10-CM

## 2023-03-02 MED ORDER — PREGABALIN 25 MG PO CAPS: 25.0000 mg | ORAL_CAPSULE | Freq: Two times a day (BID) | ORAL | 5 refills | Status: DC

## 2023-03-15 ENCOUNTER — Ambulatory Visit: Payer: Self-pay | Admitting: Family

## 2023-03-15 ENCOUNTER — Ambulatory Visit: Admitting: Sports Medicine

## 2023-03-15 NOTE — Telephone Encounter (Signed)
  Chief Complaint: back pain Symptoms: urinary frequency, back pain, pressure in pelvis Frequency: about a week Pertinent Negatives: Patient denies fever, weakness, illness, SOB, BM changes, blood in urine,   Disposition: [] ED /[] Urgent Care (no appt availability in office) / [x] Appointment(In office/virtual)/ []  [facility]/ [] Home Care/ [] Refused Recommended Disposition /[] [place] [facility]/ []  Follow-up with PCP  Additional Notes: Pt states that she has been dx with spinal stenosis. Pt states that she is having back, leg, private area pain. Pt states this feels similar to when she had been dx with the stenosis. Pt states she got shots for the pain, the shots are every 3 months, but states she isn't working and hasn't had a shot since dec. Pt states that she has urinary frequency. Pt states that her last BM was yesterday. Denies N/V/D. Pt states that she took her husbands pills and they helped with the pain-diclofenac.  Pt sched for today with pcp office.   Copied from CRM [id number]. Topic: Clinical - Red Word Triage >> Mar 15, 2023  9:49 AM Corin V wrote: [facility] that prompted transfer to Nurse Triage: Severe pain in back of legs and through back. Feels like she is constipated/having a baby/having a bad period. Unable to walk or sleep. Reason for Disposition  [1] Pain radiates into the thigh or further down the leg AND [2] both legs  Answer Assessment - Initial Assessment Questions 1. ONSET: "When did the pain begin?"      A week 2. LOCATION: "Where does it hurt?" (upper, mid or lower back)     lower 3. SEVERITY: "How bad is the pain?"  (e.g., Scale 1-10; mild, moderate, or severe)   - MILD (1-3): Doesn't interfere with normal activities.    - MODERATE (4-7): Interferes with normal activities or awakens from sleep.    - SEVERE (8-10): Excruciating pain, unable to do any normal activities.      10 4. PATTERN: "Is the pain constant?" (e.g., yes, no; constant, intermittent)       constant 5. RADIATION: "Does the pain shoot into your legs or somewhere else?"     legs 6. CAUSE:  "What do you think is causing the back pain?"      unsure 7. BACK OVERUSE:  "Any recent lifting of heavy objects, strenuous work or exercise?"     Riding my bike, then the next day I noticed it 8. MEDICINES: "What have you taken so far for the pain?" (e.g., nothing, acetaminophen, NSAIDS)     Ice, heat, tylenol/ibu doesn't work, gabapentin helps 9. NEUROLOGIC SYMPTOMS: "Do you have any weakness, numbness, or problems with bowel/bladder control?"     denies 10. OTHER SYMPTOMS: "Do you have any other symptoms?" (e.g., fever, abdomen pain, burning with urination, blood in urine)       Urinary frequency, denies blood in urine, pain in private area  Protocols used: Back Pain-A-AH

## 2023-03-15 NOTE — Telephone Encounter (Signed)
 Message routed to Dr.Veludandi who is schedule to see patient today.

## 2023-03-22 ENCOUNTER — Ambulatory Visit
Payer: Medicare Other | Attending: Student in an Organized Health Care Education/Training Program | Admitting: Student in an Organized Health Care Education/Training Program

## 2023-03-22 ENCOUNTER — Encounter: Payer: Self-pay | Admitting: Student in an Organized Health Care Education/Training Program

## 2023-03-22 VITALS — BP 120/81 | HR 91 | Temp 97.4°F | Resp 16 | Ht 69.0 in | Wt 170.0 lb

## 2023-03-22 DIAGNOSIS — M19011 Primary osteoarthritis, right shoulder: Secondary | ICD-10-CM | POA: Insufficient documentation

## 2023-03-22 DIAGNOSIS — M1711 Unilateral primary osteoarthritis, right knee: Secondary | ICD-10-CM | POA: Insufficient documentation

## 2023-03-22 DIAGNOSIS — M5416 Radiculopathy, lumbar region: Secondary | ICD-10-CM | POA: Diagnosis not present

## 2023-03-22 DIAGNOSIS — G894 Chronic pain syndrome: Secondary | ICD-10-CM | POA: Insufficient documentation

## 2023-03-22 MED ORDER — DICLOFENAC SODIUM 75 MG PO TBEC: 75.0000 mg | DELAYED_RELEASE_TABLET | Freq: Two times a day (BID) | ORAL | 0 refills | Status: DC

## 2023-03-22 NOTE — Progress Notes (Signed)
 PROVIDER NOTE: Information contained herein reflects review and annotations entered in association with encounter. Interpretation of such information and data should be left to medically-trained personnel. Information provided to patient can be located elsewhere in the medical record under "Patient Instructions". Document created using STT-dictation technology, any transcriptional errors that may result from process are unintentional.    Patient: Kaylee Warner  Service Category: E/M  Provider: Edward Jolly, MD  DOB: 11/03/1945  DOS: 03/22/2023  Referring Provider: Caesar Bookman, NP  MRN: [id number]  Specialty: Interventional Pain Management  PCP: Caesar Bookman, NP  Type: Established Patient  Setting: Ambulatory outpatient    Location: Office  Delivery: Face-to-face     HPI  [name], a 78 y.o. 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 [name] primary complain today is Back Pain (Low, low back right )  Pertinent problems: [name] has Rotator cuff syndrome of right shoulder; Chronic pain syndrome; Depression; Right rotator cuff tear arthropathy; Spinal stenosis, lumbar region, with neurogenic claudication; Lumbar facet arthropathy; Lumbar degenerative disc disease; and Chronic radicular lumbar pain on their pertinent problem list. Pain Assessment: Severity of Chronic pain is reported as a 10-Worst pain ever/10. Location: Back Lower, Right/down right leg to the foot. Onset: More than a month ago. Quality: Discomfort, Other (Comment) (weak and a feeling of giving out in the right leg). Timing: Intermittent. Modifying factor(s): heat, ice, topical patches. Vitals:  height is 5\' 9"  (1.753 m) and weight is 170 lb (77.1 kg). Her temporal temperature is 97.4 F (36.3 C) (abnormal). Her blood pressure is 120/81 and her pulse is 91. Her respiration is 16 and oxygen saturation is 95%.  BMI: Estimated body mass index is 25.1 kg/m as  calculated from the following:   Height as of this encounter: 5\' 9"  (1.753 m).   Weight as of this encounter: 170 lb (77.1 kg). Last encounter: 02/02/2023. Last procedure: 02/15/2023.  Reason for encounter:   Discussed the use of AI scribe software for clinical note transcription with the patient, who gave verbal consent to proceed.  History of Present Illness   The patient, with a history of spinal stenosis, presents with right-sided sciatica and knee pain.  She experiences right-sided sciatica with pain originating from the right lower back, radiating down the right buttock, extending to the groin, and continuing down the leg. This pain is consistent with her history of spinal stenosis diagnosed approximately five to six years ago. She has received epidural injections about every six months, though this frequency decreased to once a year after she stopped working. She is scheduled for another epidural injection on April 03, 2023, but finds it difficult to wait due to the severity of her symptoms. A recent incident where she attempted to lift her sister, who has cancer, may have exacerbated her back condition.   Regarding her knee pain, she notes swelling and has previously had fluid drained from it. She is considering knee surgery once the weather improves and is seeking a new orthopedic doctor in [place], as she is not satisfied with her current one in [place].  She is taking pregabalin and diclofenac for pain management. Diclofenac, an anti-inflammatory medication, has been particularly helpful, although she is concerned about the potential risks of taking it frequently. No current kidney problems.       ROS  Constitutional: Denies any fever or chills Gastrointestinal: No reported hemesis, hematochezia, vomiting, or acute GI distress Musculoskeletal:  as above Neurological:  as above  Medication Review  Apoaequorin, DSS, Vitamin D3, amLODipine, aspirin EC, cetirizine,  cyanocobalamin, diclofenac, diclofenac Sodium, donepezil, fluticasone, lisinopril, magnesium gluconate, pregabalin, sertraline, and tiZANidine  History Review  Allergy: [name] is allergic to celecoxib, codeine, crestor [rosuvastatin calcium], fexofenadine, and penicillins. Drug: [name]  reports no history of drug use. Alcohol:  reports current alcohol use of about 7.0 standard drinks of alcohol per week. Tobacco:  reports that she has never smoked. She has never used smokeless tobacco. Social: [name]  reports that she has never smoked. She has never used smokeless tobacco. She reports current alcohol use of about 7.0 standard drinks of alcohol per week. She reports that she does not use drugs. Medical:  has a past medical history of Allergy, Anemia, Arthritis, and Hypertension. Surgical: [name]  has a past surgical history that includes SARK 2009 and Colonoscopy. Family: family history includes Arthritis in an other family member; Diabetes in her sister, son, son, and another family member; Hypertension in her son; Sjogren's syndrome in her daughter.  Laboratory Chemistry Profile   Renal Lab Results  Component Value Date   BUN 9 02/07/2023   CREATININE 0.78 02/07/2023   BCR SEE NOTE: 02/07/2023   GFRAA 106 05/08/2019   GFRNONAA 92 05/08/2019    Hepatic Lab Results  Component Value Date   AST 18 02/07/2023   ALT 12 02/07/2023   ALBUMIN 4.0 12/16/2015   ALKPHOS 53 12/16/2015    Electrolytes Lab Results  Component Value Date   NA 142 02/07/2023   K 3.9 02/07/2023   CL 103 02/07/2023   CALCIUM 9.7 02/07/2023   MG 2.1 02/21/2023    Bone No results found for: "VD25OH", "VD125OH2TOT", "GN5621HY8", "MV7846NG2", "25OHVITD1", "25OHVITD2", "25OHVITD3", "TESTOFREE", "TESTOSTERONE"  Inflammation (CRP: Acute Phase) (ESR: Chronic Phase) No results found for: "CRP", "ESRSEDRATE", "LATICACIDVEN"       Note: Above Lab results reviewed.  Physical Exam  General appearance: Well  nourished, well developed, and well hydrated. In no apparent acute distress Mental status: Alert, oriented x 3 (person, place, & time)       Respiratory: No evidence of acute respiratory distress Eyes: PERLA Vitals: BP 120/81 (BP Location: Right Arm, Patient Position: Sitting, Cuff Size: Normal)   Pulse 91   Temp (!) 97.4 F (36.3 C) (Temporal)   Resp 16   Ht 5\' 9"  (1.753 m)   Wt 170 lb (77.1 kg)   SpO2 95%   BMI 25.10 kg/m  BMI: Estimated body mass index is 25.1 kg/m as calculated from the following:   Height as of this encounter: 5\' 9"  (1.753 m).   Weight as of this encounter: 170 lb (77.1 kg). Ideal: Ideal body weight: 66.2 kg (145 lb 15.1 oz) Adjusted ideal body weight: 70.6 kg (155 lb 9.1 oz)  Patient presents today in wheelchair.  Low back pain with radiation into right groin and right leg  Right knee pain, worse with weightbearing, swelling of right knee  Assessment   Diagnosis  1. Primary osteoarthritis of right knee   2. Lumbar radiculopathy   3. Arthropathy of right shoulder   4. Chronic pain syndrome      Updated Problems: No problems updated.  Plan of Care  Problem-specific:  Assessment and Plan    Sciatica   Pain originates from the right lower back, extends down the right buttock, wraps around to the groin, and travels down the leg, consistent with sciatica. Spinal stenosis, diagnosed five or six  years ago, is managed with annual epidural injections. Recent symptom exacerbation may be due to physical strain while lifting her sister. She is scheduled for an epidural injection on March 24th at another facility. Current pain management is handled by another provider to avoid conflicting treatments. Follow up with the current pain management provider for the scheduled epidural injection on March 24th. Discuss with the current provider about the management of sciatica and any additional imaging or interventions needed.  Spinal stenosis   Diagnosed five or six  years ago, managed with periodic epidural injections. It may contribute to current sciatica symptoms. No recent imaging reported; previous imaging was done at a pain clinic in [place]. Current management is handled by another provider to avoid conflicting treatments. Discuss with her current pain management provider about the need for updated imaging and further management of spinal stenosis.  Knee pain   Persistent knee pain and swelling, initially thought to be knee-related but now suspected to be related to back issues. Swelling persists despite previous interventions, including fluid aspiration. She is considering knee surgery once the weather improves and is seeking a new orthopedic specialist in [place]. Diclofenac was previously effective for pain management. Send diclofenac prescription to the pharmacy for pain management. Advise her to find an orthopedic specialist in [facility] for further evaluation and potential knee surgery.       Ms. VALLERIE HENTZ has a current medication list which includes the following long-term medication(s): amlodipine, cetirizine, donepezil, fluticasone, lisinopril, pregabalin, and sertraline.  Pharmacotherapy (Medications Ordered): Meds ordered this encounter  Medications   diclofenac (VOLTAREN) 75 MG EC tablet    Sig: Take 1 tablet (75 mg total) by mouth 2 (two) times daily.    Dispense:  60 tablet    Refill:  0   Orders:  No orders of the defined types were placed in this encounter.  Follow-up plan:   Return for PRN.      Right posterior glenohumeral joint injection 10/28/2019, 05/25/20, right anterior glenohumeral joint injection 04/12/21, right knee triamcinolone ER; right posterior glenohumeral joint injection, right posterior trapezius TPI 08/02/2021. Right GNB  10/10/22       Recent Visits Date Type Provider Dept  02/15/23 Procedure visit Edward Jolly, MD [facility]  02/02/23 Office Visit Edward Jolly, MD [facility]  Showing recent visits within past 90 days and meeting all other requirements Today's Visits Date Type Provider Dept  03/22/23 Office Visit Edward Jolly, MD [facility]  Showing today's visits and meeting all other requirements Future Appointments No visits were found meeting these conditions. Showing future appointments within next 90 days and meeting all other requirements  I discussed the assessment and treatment plan with the patient. The patient was provided an opportunity to ask questions and all were answered. The patient agreed with the plan and demonstrated an understanding of the instructions.  Patient advised to call back or seek an in-person evaluation if the symptoms or condition worsens.  Duration of encounter: .  Total time on encounter, as per AMA guidelines included both the face-to-face and non-face-to-face time personally spent by the physician and/or other qualified health care professional(s) on the day of the encounter (includes time in activities that require the physician or other qualified health care professional and does not include time in activities normally performed by clinical staff). Physician's time may include the following activities when performed: Preparing to see the patient (e.g., pre-charting review of records, searching for previously ordered imaging, lab work,  and nerve conduction tests) Review of prior analgesic pharmacotherapies. Reviewing PMP Interpreting ordered tests (e.g., lab work, imaging, nerve conduction tests) Performing post-procedure evaluations, including interpretation of diagnostic procedures Obtaining and/or reviewing separately obtained history Performing a medically appropriate examination and/or evaluation Counseling and educating the patient/family/caregiver Ordering medications, tests, or procedures Referring and communicating with other health care professionals (when not separately  reported) Documenting clinical information in the electronic or other health record Independently interpreting results (not separately reported) and communicating results to the patient/ family/caregiver Care coordination (not separately reported)  Note by: Edward Jolly, MD Date: 03/22/2023; Time: 4:39 PM

## 2023-03-22 NOTE — Progress Notes (Signed)
 Safety precautions to be maintained throughout the outpatient stay will include: orient to surroundings, keep bed in low position, maintain call bell within reach at all times, provide assistance with transfer out of bed and ambulation.

## 2023-03-28 ENCOUNTER — Other Ambulatory Visit: Payer: Self-pay | Admitting: Family

## 2023-03-28 DIAGNOSIS — R413 Other amnesia: Secondary | ICD-10-CM

## 2023-03-28 NOTE — Telephone Encounter (Unsigned)
 Copied from CRM [id number]. Topic: Clinical - Medication Refill >> Mar 28, 2023 10:01 AM Dondra Prader A wrote: Most Recent Primary Care Visit:  Provider: Richarda Blade C  Department: [facility]  Visit Type: OFFICE VISIT  Date: 02/21/2023  Medication: donepezil (ARICEPT) 10 MG tablet  Has the patient contacted their pharmacy? No Is this the correct pharmacy for this prescription? Yes   This is the patient's preferred pharmacy:  [facility] [address] [place] - [address] [place] [address] Phone: [phone] Fax: [phone]   Has the prescription been filled recently? No  Is the patient out of the medication? Yes  Has the patient been seen for an appointment in the last year OR does the patient have an upcoming appointment? Yes  Can we respond through MyChart? No  Agent: Please be advised that Rx refills may take up to 3 business days. We ask that you follow-up with your pharmacy.

## 2023-03-30 ENCOUNTER — Telehealth: Payer: Self-pay

## 2023-03-30 ENCOUNTER — Other Ambulatory Visit: Payer: Self-pay | Admitting: Family

## 2023-03-30 DIAGNOSIS — R413 Other amnesia: Secondary | ICD-10-CM

## 2023-03-30 MED ORDER — DONEPEZIL HCL 10 MG PO TABS: 10.0000 mg | ORAL_TABLET | Freq: Every day | ORAL | 0 refills | Status: DC

## 2023-03-30 NOTE — Addendum Note (Signed)
 Addended by: Michaell Cowing on: 03/30/2023 04:37 PM   Modules accepted: Orders

## 2023-03-30 NOTE — Telephone Encounter (Signed)
 noted

## 2023-03-30 NOTE — Telephone Encounter (Signed)
 Copied from CRM [id number]. Topic: Clinical - Medication Question >> Mar 30, 2023  1:18 PM Antony Haste wrote: Reason for CRM: The patient wanted to confirm if her donepezil (ARICEPT) 10 MG tablet has been signed off by her provider. She is requesting to have this sent over to the pharmacy before the weekend since she's completely out.  Spoke with patient this afternoon stating that she is out of her Aricept and that she token her last one on last night. I explain to the medication is pending and waiting to be sign by Ngetich, Donalee Citrin, NP. Please Advise  Message sent to Ngetich, Donalee Citrin, NP

## 2023-03-30 NOTE — Telephone Encounter (Signed)
Please refill Aricept

## 2023-03-30 NOTE — Telephone Encounter (Signed)
 Medication has been sent to pharmacy.  Message sent to Ngetich, Donalee Citrin, NP

## 2023-03-31 ENCOUNTER — Other Ambulatory Visit: Payer: Self-pay | Admitting: Family

## 2023-03-31 DIAGNOSIS — F321 Major depressive disorder, single episode, moderate: Secondary | ICD-10-CM

## 2023-03-31 NOTE — Telephone Encounter (Signed)
 Pharmacy requested refill.  Pended Rx and sent to [facility] for approval due to HIGH ALERT Warning.

## 2023-04-01 ENCOUNTER — Other Ambulatory Visit: Payer: Self-pay | Admitting: Adult Health

## 2023-04-01 DIAGNOSIS — I10 Essential (primary) hypertension: Secondary | ICD-10-CM

## 2023-04-03 DIAGNOSIS — M48062 Spinal stenosis, lumbar region with neurogenic claudication: Secondary | ICD-10-CM | POA: Diagnosis not present

## 2023-04-11 ENCOUNTER — Ambulatory Visit: Payer: Self-pay | Admitting: *Deleted

## 2023-04-11 NOTE — Telephone Encounter (Signed)
 First attempt to return her call.   Left a voicemail to call back.   Routed to call back folder for further attempts.

## 2023-04-11 NOTE — Telephone Encounter (Signed)
 Copied from CRM [id number]. Topic: Clinical - Medical Advice >> Apr 11, 2023  9:27 AM [name] wrote: Reason for CRM: Patient had an injection for her sinus problems during her last visit. Patient states it hasn't helped and she would like to know if [name] would recommend for her to get another MRI done to see what is causing her pain. Please give her a call back soon to advice her.

## 2023-04-11 NOTE — Telephone Encounter (Signed)
 3rd attempt, no contact, left VM

## 2023-04-11 NOTE — Telephone Encounter (Signed)
 2nd attempt, no contact, VM left

## 2023-04-11 NOTE — Telephone Encounter (Signed)
 Forwarded to [employer] to Advise.

## 2023-04-12 NOTE — Telephone Encounter (Signed)
 Please advise patient to schedule visit when she calls back to evaluate symptoms.

## 2023-04-12 NOTE — Telephone Encounter (Signed)
Tried calling patient, LMOM to return call.

## 2023-04-13 ENCOUNTER — Encounter: Payer: Self-pay | Admitting: Family

## 2023-04-13 ENCOUNTER — Ambulatory Visit (INDEPENDENT_AMBULATORY_CARE_PROVIDER_SITE_OTHER): Admitting: Family

## 2023-04-13 VITALS — BP 132/76 | HR 72 | Temp 97.9°F | Resp 20 | Ht 69.0 in | Wt 172.6 lb

## 2023-04-13 DIAGNOSIS — M5416 Radiculopathy, lumbar region: Secondary | ICD-10-CM

## 2023-04-13 DIAGNOSIS — G8929 Other chronic pain: Secondary | ICD-10-CM

## 2023-04-13 DIAGNOSIS — E782 Mixed hyperlipidemia: Secondary | ICD-10-CM

## 2023-04-13 DIAGNOSIS — M25561 Pain in right knee: Secondary | ICD-10-CM | POA: Diagnosis not present

## 2023-04-13 MED ORDER — ROSUVASTATIN CALCIUM 5 MG PO TABS
ORAL_TABLET | ORAL | 3 refills | Status: DC
Start: 2023-04-13 — End: 2023-12-06

## 2023-04-13 NOTE — Progress Notes (Signed)
 Provider: Richarda Blade FNP-C  Mardell Cragg, Donalee Citrin, NP  Patient Care Team: Maribel Hadley, Donalee Citrin, NP as PCP - General (Family Medicine)  Extended Emergency Contact Information Primary Emergency Contact: Ellan Lambert [place] Mobile Phone: [phone] Relation: Son Secondary Emergency Contact: Rance,James R Address: [address]          [place], [place] [address] [place] Home Phone: [phone] Relation: Spouse  Code Status:  Full Code  Goals of care: Advanced Directive information    04/13/2023    1:10 PM  Advanced Directives  Does Patient Have a Medical Advance Directive? Yes  Type of Estate agent of [place];Living will  Does patient want to make changes to medical advance directive? No - Patient declined  Copy of Healthcare Power of Attorney in Chart? No - copy requested     Chief Complaint  Patient presents with   Acute Visit    MRI evaluation    Discussed the use of AI scribe software for clinical note transcription with the patient, who gave verbal consent to proceed.  History of Present Illness   Kaylee Warner "Kaylee Warner" is a 78 year old female with chronic knee pain who presents with persistent pain and medication management issues. Lauren, her son's fiance on the phone provided additional HPI information.  She has been experiencing persistent pain primarily in the right knee for several years. She underwent arthroscopic surgery on the knee about seven to eight years ago. Recently, she received an injection for her knee pain, which did not provide relief. The pain radiates from the groin area down to the knee and leg, particularly in the mornings when her leg feels 'like drawing.' She has not had a recent x-ray of the knee, with the last one being in 2016.  She experiences back pain described as similar to a toothache, radiating down her legs and sometimes causing pressure in the vaginal area. She had a lumbar spine  x-ray in 2021 and received an injection for back pain two weeks ago, which did not alleviate her symptoms. She reports numbness and tingling in her legs, particularly after sitting.  She is currently taking diclofenac 75 mg twice daily for pain management but is concerned about the potential impact on her kidneys. She is also using tizanidine 4 mg at night as needed for muscle relaxation. She has stopped taking certain medications due to cost, such as Privagen, and prefers using Miralax for bowel regulation when necessary. Her current medications include sertraline for depression and anxiety, amlodipine and lisinopril for blood pressure, magnesium, vitamin B12, vitamin D, Lyrica 25 mg twice daily, Flonase, Zyrtec, and aspirin 81 mg. She has a history of spinal stenosis and is concerned about the progression of her symptoms.  She mentions a family history of her sister having a blood clot in her leg and cancer in her stomach, which has been a source of stress for her.   Past Medical History:  Diagnosis Date   Allergy    Anemia    Arthritis    Hypertension    Past Surgical History:  Procedure Laterality Date   COLONOSCOPY     SARK 2009     Dr. Darrick Meigs     Allergies  Allergen Reactions   Celecoxib     REACTION: blood pressure went up   Codeine Itching   Crestor [Rosuvastatin Calcium]     Muscle cramping and achy   Fexofenadine Other (See Comments)    unknown  Penicillins Rash    Outpatient Encounter Medications as of 04/13/2023  Medication Sig   amLODipine (NORVASC) 10 MG tablet Take 1 tablet (10 mg total) by mouth daily.   aspirin EC 81 MG tablet Take 1 tablet (81 mg total) by mouth daily.   cetirizine (ZYRTEC) 10 MG tablet Take 1 tablet by mouth once daily   Cholecalciferol (VITAMIN D3) 125 MCG (5000 UT) capsule Take 1 capsule (5,000 Units total) by mouth every morning.   cyanocobalamin (VITAMIN B12) 1000 MCG tablet Take 1,000 mcg by mouth daily.   diclofenac (VOLTAREN) 75 MG EC  tablet Take 1 tablet (75 mg total) by mouth 2 (two) times daily.   diclofenac Sodium (VOLTAREN) 1 % GEL Apply topically to right knee 4X/day as needed for pain   donepezil (ARICEPT) 10 MG tablet Take 1 tablet (10 mg total) by mouth at bedtime.   fluticasone (FLONASE) 50 MCG/ACT nasal spray Use 2 spray(s) in each nostril once daily   lisinopril (ZESTRIL) 40 MG tablet Take 1 tablet by mouth once daily   pregabalin (LYRICA) 25 MG capsule Take 1 capsule (25 mg total) by mouth 2 (two) times daily.   rosuvastatin (CRESTOR) 5 MG tablet Take on three times per week on Monday,Wednesday and Friday   sertraline (ZOLOFT) 25 MG tablet Take 2 tablets by mouth once daily   tiZANidine (ZANAFLEX) 4 MG tablet Take 1 tablet (4 mg total) by mouth 2 (two) times daily as needed.   magnesium gluconate (MAGONATE) 500 MG tablet Take 500 mg by mouth daily at 6 (six) AM.   [DISCONTINUED] Apoaequorin (PREVAGEN) 10 MG CAPS Take 1 capsule by mouth daily. (Patient not taking: Reported on 04/13/2023)   [DISCONTINUED] Docusate Sodium (DSS) 100 MG CAPS Take 100 mg by mouth daily. (Patient not taking: Reported on 02/21/2023)   No facility-administered encounter medications on file as of 04/13/2023.    Review of Systems  Constitutional:  Negative for appetite change, chills, fatigue, fever and unexpected weight change.  Eyes:  Negative for pain, discharge, redness, itching and visual disturbance.  Respiratory:  Negative for cough, chest tightness, shortness of breath and wheezing.   Cardiovascular:  Negative for chest pain, palpitations and leg swelling.  Gastrointestinal:  Negative for abdominal distention, abdominal pain, blood in stool, constipation, diarrhea, nausea and vomiting.  Musculoskeletal:  Positive for arthralgias and back pain. Negative for gait problem, joint swelling, myalgias, neck pain and neck stiffness.       Chronic lower back and right knee pain worsening   Skin:  Negative for color change, pallor, rash and  wound.  Neurological:  Negative for dizziness, weakness, light-headedness and headaches.  Psychiatric/Behavioral:  Negative for agitation, behavioral problems, confusion, hallucinations, self-injury, sleep disturbance and suicidal ideas. The patient is not nervous/anxious.     Immunization History  Administered Date(s) Administered   Fluad Quad(high Dose 65+) 10/29/2018, 10/14/2020   Influenza, High Dose Seasonal PF 11/24/2017, 09/20/2022   Influenza,inj,Quad PF,6+ Mos 09/24/2015, 12/15/2016   Moderna Sars-Covid-2 Vaccination 03/29/2019, 04/26/2019, 11/23/2019, 12/17/2019   Pneumococcal Conjugate-13 12/16/2015   Pneumococcal Polysaccharide-23 06/27/2018, 07/19/2021   Td 01/17/2017   Td,absorbed, Preservative Free, Adult Use, Lf Unspecified 11/23/2010   Tdap 11/23/2010   Zoster Recombinant(Shingrix) 06/20/2022   Pertinent  Health Maintenance Due  Topic Date Due   Colonoscopy  10/17/2021   INFLUENZA VACCINE  08/11/2023   DEXA SCAN  Completed      02/07/2023    9:29 AM 02/15/2023    1:46 PM 02/21/2023  8:55 AM 03/22/2023    2:42 PM 04/13/2023    1:07 PM  Fall Risk  Falls in the past year?  0 0 0 1  Was there an injury with Fall? 0  0  0  Fall Risk Category Calculator   0  1  Patient at Risk for Falls Due to     History of fall(s)  Fall risk Follow up    Falls evaluation completed Falls evaluation completed   Functional Status Survey:    Vitals:   04/13/23 1314  BP: 132/76  Pulse: 72  Resp: 20  Temp: 97.9 F (36.6 C)  SpO2: 95%  Weight: 172 lb 9.6 oz (78.3 kg)  Height: 5\' 9"  (1.753 m)   Body mass index is 25.49 kg/m. Physical Exam VITALS: BP- 132/76 GENERAL: Alert, cooperative, well developed, no acute distress HEENT: Normocephalic, normal oropharynx, moist mucous membranes CHEST: Clear to auscultation bilaterally, no wheezes, rhonchi, or crackles CARDIOVASCULAR: Normal heart rate and rhythm, S1 and S2 normal without murmurs ABDOMEN: Soft, non-tender, non-distended,  without organomegaly, normal bowel sounds MSK: right lateral knee tenderness, no erythema or ecchymosis. Lumbar spine non-tender to palpation  EXTREMITIES: No cyanosis or edema NEUROLOGICAL: Cranial nerves grossly intact, moves all extremities without gross motor or sensory deficit   Labs reviewed: Recent Labs    02/07/23 1100 02/21/23 0935  NA 142  --   K 3.9  --   CL 103  --   CO2 26  --   GLUCOSE 86  --   BUN 9  --   CREATININE 0.78  --   CALCIUM 9.7  --   MG  --  2.1   Recent Labs    02/07/23 1100  AST 18  ALT 12  BILITOT 0.5  PROT 7.2   Recent Labs    02/07/23 1100  WBC 6.2  NEUTROABS 3,999  HGB 12.6  HCT 40.4  MCV 76.2*  PLT 337   Lab Results  Component Value Date   TSH 1.38 02/07/2023   Lab Results  Component Value Date   HGBA1C 6.2 (H) 02/07/2023   Lab Results  Component Value Date   CHOL 261 (H) 02/07/2023   HDL 66 02/07/2023   LDLCALC 169 (H) 02/07/2023   TRIG 126 02/07/2023   CHOLHDL 4.0 02/07/2023    Significant Diagnostic Results in last 30 days:  No results found.  Assessment/Plan  Right Knee Pain Chronic right knee pain with swelling and aching, likely due to osteoarthritis. Previous arthroscopic surgery performed 7-8 years ago. Recent steroid injection did not provide relief. Differential includes progression of osteoarthritis or other degenerative changes. - Order MRI of the right knee - Refer to orthopedic surgeon, Dr. Magnus Ivan per patient's request, for further evaluation and potential knee replacement - Consider MRI of the knee if insurance approves  Lumbar Spinal Stenosis Chronic lower back pain with radiation to the right leg, consistent with lumbar spinal stenosis. Previous lumbar spine x-ray in 2021. Recent injection did not alleviate symptoms. - Order MRI of the lumbar spine to assess current status of spinal stenosis  Hypertension Well-controlled hypertension on current regimen of amlodipine 10 mg and lisinopril 40  mg.  Hyperlipidemia Hyperlipidemia with previous intolerance to statins. Crestor (rosuvastatin) 5 mg was previously tolerated when taken three times a week. She will continue this regimen to manage cholesterol levels. - Prescribe Crestor 5 mg to be taken three times a week (Monday, Wednesday, Friday) - Schedule follow-up lab work on July 8 to monitor cholesterol  levels  Depression and Anxiety Ongoing management of depression and anxiety with sertraline.  General Health Maintenance She is on vitamin D, B12, magnesium, and aspirin for general health maintenance. Reports good bowel movements with dietary adjustments and occasional use of Miralax. - Continue current supplements and dietary adjustments for bowel regularity  Follow-up Follow-up plans discussed for imaging and specialist referral. Imaging to be done in [place] for convenience. - Schedule follow-up appointment in six months for routine evaluation - Ensure imaging center contacts her within 2-3 days to schedule MRI  - Follow up with orthopedic referral to Dr. Magnus Ivan   Family/ staff Communication: Reviewed plan of care with patient verbalized understanding   Labs/tests ordered:  MRI of lumbar spine and right knee    Next Appointment: Return if symptoms worsen or fail to improve.   Total time: 30 minutes. Greater than 50% of total time spent doing patient education regarding right chronic knee pain ,chronic back pain,health maintenance including symptom/medication management.   Caesar Bookman, NP

## 2023-04-17 ENCOUNTER — Other Ambulatory Visit: Payer: Self-pay | Admitting: Family

## 2023-04-17 DIAGNOSIS — J310 Chronic rhinitis: Secondary | ICD-10-CM

## 2023-04-17 NOTE — Telephone Encounter (Signed)
 Patient has had an appointment for evaluation and MRI scheduled per patient.

## 2023-04-19 ENCOUNTER — Telehealth: Payer: Self-pay

## 2023-04-19 DIAGNOSIS — F40240 Claustrophobia: Secondary | ICD-10-CM

## 2023-04-19 MED ORDER — LORAZEPAM 0.5 MG PO TABS: ORAL_TABLET | ORAL | 0 refills | Status: DC

## 2023-04-19 NOTE — Telephone Encounter (Signed)
 Ativan 0.5 mg tablet one by mouth x 1 dose 30 minutes prior to MRI on 04/24/2023 my repeat x 1 dose if needed.will need someone to drive after the MRI  incase of drowsiness.

## 2023-04-19 NOTE — Telephone Encounter (Signed)
 Copied from CRM [id number]. Topic: Clinical - Medication Question >> Apr 19, 2023  9:50 AM Irine Seal wrote: Reason for CRM: Patient calling requesting a Rx for a medication to take for  an MRI due to being claustrophobic her appointment for the MRI is scheduled 04/24/2023       Preferred pharmacy: [facility] [address], [place] - [address]  [address] [place], [place] [address]  Patient aware rx sent

## 2023-04-21 ENCOUNTER — Other Ambulatory Visit: Payer: Self-pay | Admitting: Family

## 2023-04-21 DIAGNOSIS — I10 Essential (primary) hypertension: Secondary | ICD-10-CM

## 2023-04-24 ENCOUNTER — Ambulatory Visit (HOSPITAL_COMMUNITY)
Admission: RE | Admit: 2023-04-24 | Discharge: 2023-04-24 | Disposition: A | Discharge status: Home / Self Care | Source: Ambulatory Visit | Attending: Family | Admitting: Family

## 2023-04-24 DIAGNOSIS — M5416 Radiculopathy, lumbar region: Secondary | ICD-10-CM

## 2023-04-24 DIAGNOSIS — M2391 Unspecified internal derangement of right knee: Secondary | ICD-10-CM | POA: Diagnosis not present

## 2023-04-24 DIAGNOSIS — M25561 Pain in right knee: Secondary | ICD-10-CM | POA: Diagnosis not present

## 2023-04-24 DIAGNOSIS — N1330 Unspecified hydronephrosis: Secondary | ICD-10-CM | POA: Diagnosis not present

## 2023-04-24 DIAGNOSIS — M47816 Spondylosis without myelopathy or radiculopathy, lumbar region: Secondary | ICD-10-CM | POA: Diagnosis not present

## 2023-04-24 DIAGNOSIS — M25461 Effusion, right knee: Secondary | ICD-10-CM | POA: Diagnosis not present

## 2023-04-24 DIAGNOSIS — M5136 Other intervertebral disc degeneration, lumbar region with discogenic back pain only: Secondary | ICD-10-CM | POA: Diagnosis not present

## 2023-04-24 DIAGNOSIS — G8929 Other chronic pain: Secondary | ICD-10-CM | POA: Insufficient documentation

## 2023-04-24 DIAGNOSIS — M5126 Other intervertebral disc displacement, lumbar region: Secondary | ICD-10-CM | POA: Diagnosis not present

## 2023-04-24 DIAGNOSIS — M7121 Synovial cyst of popliteal space [Baker], right knee: Secondary | ICD-10-CM | POA: Diagnosis not present

## 2023-04-24 DIAGNOSIS — M1711 Unilateral primary osteoarthritis, right knee: Secondary | ICD-10-CM | POA: Diagnosis not present

## 2023-05-03 ENCOUNTER — Ambulatory Visit: Admitting: Orthopaedic Surgery

## 2023-05-03 ENCOUNTER — Other Ambulatory Visit (INDEPENDENT_AMBULATORY_CARE_PROVIDER_SITE_OTHER)

## 2023-05-03 DIAGNOSIS — M1711 Unilateral primary osteoarthritis, right knee: Secondary | ICD-10-CM

## 2023-05-03 NOTE — Progress Notes (Signed)
 The patient is a very pleasant and active 78 year old female who comes in today for worsening right knee pain has been going on for years now.  She has had arthroscopic surgery on her right knee as well as several rounds of steroid injections and hyaluronic acid injections.  At this point she does wish to discuss knee replacement surgery.  She does report right knee swelling and a deformity of that knee.  She denies any injuries but at this point this is affecting her hip on the right side and her back and is definitely affecting her mobility, her quality of life and her actives daily living.  Her pain to be 10 out of 10 on a daily basis.  I was able to review all of her medications and past medical history and epic.  She still drives.  She is on Aricept  for some memory issues but is still very active and mobile.  She is not a diabetic.  She is not obese.  Examination of her right knee shows significant valgus malalignment compared to her left knee that is straight.  There is a mild joint effusion.  There is significant lateral joint line tenderness and patellofemoral crepitation and pain throughout the arc of motion of her knee.  2 views of the right knee show valgus malalignment with bone-on-bone wear of the lateral compartment and patellofemoral joint with arthritic changes and osteophytes in all 3 compartments.  Her right knee on the AP view shows normal alignment.  We had a long and thorough discussion about knee replacement surgery.  We discussed the risk and benefits of the surgery and what to expect from an intraoperative and postoperative standpoint.  All questions concerns were answered and addressed.  We will be in touch with scheduling her for right total knee replacement.

## 2023-05-04 ENCOUNTER — Other Ambulatory Visit: Payer: Self-pay | Admitting: Orthopaedic Surgery

## 2023-05-04 ENCOUNTER — Ambulatory Visit: Payer: Self-pay

## 2023-05-04 ENCOUNTER — Telehealth: Payer: Self-pay | Admitting: Orthopaedic Surgery

## 2023-05-04 MED ORDER — TRAMADOL HCL 50 MG PO TABS: 50.0000 mg | ORAL_TABLET | Freq: Two times a day (BID) | ORAL | 0 refills | Status: DC | PRN

## 2023-05-04 NOTE — Telephone Encounter (Signed)
 Please advise. To be scheduled soon for Right tka. Just advise on tylenol  and ibu?

## 2023-05-04 NOTE — Telephone Encounter (Signed)
 Patient follows up with Pain management.Previously on hydrocodone  but urine drug test was positive for  marijuana so was referred to pain management.

## 2023-05-04 NOTE — Telephone Encounter (Signed)
 2nd attempt, Patient called, left VM to return the call to the office to speak to NT.   Copied from CRM [id number]. Topic: Clinical - Medical Advice >> May 04, 2023  9:59 AM Wynona Hedger wrote: Reason for CRM: patient would like Dr. Nedra Ball to prescribed her some hyrocodone for her knee pain

## 2023-05-04 NOTE — Telephone Encounter (Signed)
 Patient called and ask if she could get something for pain. [phone]

## 2023-05-04 NOTE — Telephone Encounter (Signed)
 Copied from CRM [id number]. Topic: Clinical - Medical Advice >> May 04, 2023  9:59 AM Wynona Hedger wrote: Reason for CRM: patient would like Dr. Nedra Ball to prescribed her some hyrocodone for her knee pain

## 2023-05-04 NOTE — Telephone Encounter (Signed)
 Reason for Disposition ?? Third attempt to contact caller AND no contact made. Phone number verified. ? ?Protocols used: No Contact or Duplicate Contact Call-A-AH ? ?

## 2023-05-04 NOTE — Telephone Encounter (Signed)
 3 attempts, no contact made. LVM

## 2023-05-05 ENCOUNTER — Other Ambulatory Visit: Payer: Self-pay | Admitting: Family

## 2023-05-05 NOTE — Telephone Encounter (Signed)
 FYI- medication appears to have been filled by pharmacy on 05/04/23.

## 2023-05-05 NOTE — Telephone Encounter (Signed)
 Copied from CRM [id number]. Topic: Clinical - Medication Refill >> May 05, 2023  1:11 PM Retta Caster wrote: Most Recent Primary Care Visit:  Provider: Christean Courts C  Department: [facility]  Visit Type: ACUTE  Date: 04/13/2023  Medication: traMADol  (ULTRAM ) 50 MG tablet   Has the patient contacted their pharmacy? Yes (Agent: If no, request that the patient contact the pharmacy for the refill. If patient does not wish to contact the pharmacy document the reason why and proceed with request.) (Agent: If yes, when and what did the pharmacy advise?)  Is this the correct pharmacy for this prescription? Yes If no, delete pharmacy and type the correct one.  This is the patient's preferred pharmacy:  [facility] [address], [place] - [address] [place] [address] Phone: [phone] Fax: [phone]   Has the prescription been filled recently? No  Is the patient out of the medication? No first time taking  Has the patient been seen for an appointment in the last year OR does the patient have an upcoming appointment? Yes  Can we respond through MyChart? No  Agent: Please be advised that Rx refills may take up to 3 business days. We ask that you follow-up with your pharmacy.

## 2023-05-09 ENCOUNTER — Telehealth: Payer: Self-pay | Admitting: Orthopaedic Surgery

## 2023-05-09 ENCOUNTER — Other Ambulatory Visit: Payer: Self-pay | Admitting: Orthopaedic Surgery

## 2023-05-09 NOTE — Telephone Encounter (Signed)
 LMOM if the below message

## 2023-05-09 NOTE — Telephone Encounter (Signed)
 Patient called. She can not take Tramadol . It's making her sick. She would like something else called in for her.

## 2023-05-09 NOTE — Telephone Encounter (Signed)
 Patient called and said that the tramadol  he called in she can't take it and she needs something else because tylenol  isn't helping. [phone]

## 2023-05-17 NOTE — Progress Notes (Signed)
-   has degenerative changes, in her lumbar spine, disc bulge at L2-3, multilevel foraminal stenosis -is she being seen by a neurosurgery?  If not then she needs to follow-up in the clinic to discuss lumbar MRI result and for possible referral.

## 2023-05-18 ENCOUNTER — Encounter: Payer: Self-pay | Admitting: Adult Health

## 2023-05-18 ENCOUNTER — Telehealth (INDEPENDENT_AMBULATORY_CARE_PROVIDER_SITE_OTHER): Admitting: Adult Health

## 2023-05-18 VITALS — Ht 69.0 in | Wt 170.0 lb

## 2023-05-18 DIAGNOSIS — M48062 Spinal stenosis, lumbar region with neurogenic claudication: Secondary | ICD-10-CM

## 2023-05-18 DIAGNOSIS — R413 Other amnesia: Secondary | ICD-10-CM | POA: Diagnosis not present

## 2023-05-18 DIAGNOSIS — I10 Essential (primary) hypertension: Secondary | ICD-10-CM

## 2023-05-18 DIAGNOSIS — K8080 Other cholelithiasis without obstruction: Secondary | ICD-10-CM | POA: Diagnosis not present

## 2023-05-18 DIAGNOSIS — D219 Benign neoplasm of connective and other soft tissue, unspecified: Secondary | ICD-10-CM | POA: Diagnosis not present

## 2023-05-18 DIAGNOSIS — F3341 Major depressive disorder, recurrent, in partial remission: Secondary | ICD-10-CM | POA: Diagnosis not present

## 2023-05-18 DIAGNOSIS — M5136 Other intervertebral disc degeneration, lumbar region with discogenic back pain only: Secondary | ICD-10-CM | POA: Diagnosis not present

## 2023-05-18 NOTE — Progress Notes (Signed)
 Virtual Visit via Video Note  I connected with Kaylee Warner on 05/19/23 at  3:00 PM EDT by a video enabled telemedicine application and verified that I am speaking with the correct person using two identifiers.  Patient Location: Home Provider Location: Office/Clinic  I discussed the limitations, risks, security, and privacy concerns of performing an evaluation and management service by video and the availability of in person appointments. I also discussed with the patient that there may be a patient responsible charge related to this service. The patient expressed understanding and agreed to proceed.  Subjective: PCP: Estil Heman, NP  Chief Complaint  Patient presents with   discuss results of MRI    Pt had MRI done on 04/28/23, MRI lumbar , no other concern    Discussed the use of AI scribe software for clinical note transcription with the patient, who gave verbal consent to proceed.  HPI  Kaylee Warner "Wing Hauser" is a 78 year old female with chronic spinal stenosis who had a video visit for follow-up regarding her MRI results and ongoing back pain. She was with her son during the video visit.  She has experienced chronic back pain for several years, initially diagnosed with spinal stenosis about twelve years ago. An MRI of her lumbar spine on April 24, 2023, showed degenerative changes.  Several spinal stenosis at L3-4 and additional moderate to severe spinal canal stenosis at L4-5.  Disc bulge at L2-3 resulting in lateral recess narrowing likely impingement upon the traversing left-sided nerve roots. She has been receiving injections from Dr. Vance Gell at [facility] in [place], which previously provided relief for about six months, but the last injection only lasted a month. Her back pain is currently rated at 7 to 8 out of 10.  She reports ongoing knee pain and is scheduled for knee replacement surgery on Jun 02, 2023, with six weeks of rehabilitation planned. No  diagnosis of osteoporosis, and her last bone density test was two years ago.  During imaging, she was incidentally found to have gallstones (cholelithiasis) and uterine fibroids, but these are not currently causing symptoms.  Her current medications include lisinopril  40 mg daily for hypertension, Lyrica  25 mg twice a day for pain, tizanidine  4 mg twice a day as needed for muscle spasms, Voltaren  75 mg twice a day for pain, Zoloft  50 mg daily for depression, and Aricept  for memory issues. Her blood pressure at home is 128/78.    ROS: Per HPI  Current Outpatient Medications:    amLODipine  (NORVASC ) 10 MG tablet, Take 1 tablet (10 mg total) by mouth daily., Disp: 90 tablet, Rfl: 3   aspirin  EC 81 MG tablet, Take 1 tablet (81 mg total) by mouth daily., Disp: 30 tablet, Rfl: 3   cetirizine  (ZYRTEC ) 10 MG tablet, Take 1 tablet by mouth once daily, Disp: 90 tablet, Rfl: 1   Cholecalciferol (VITAMIN D3) 125 MCG (5000 UT) capsule, Take 1 capsule (5,000 Units total) by mouth every morning., Disp: 30 capsule, Rfl: 0   cyanocobalamin (VITAMIN B12) 1000 MCG tablet, Take 1,000 mcg by mouth daily., Disp: , Rfl:    diclofenac  (VOLTAREN ) 75 MG EC tablet, Take 1 tablet (75 mg total) by mouth 2 (two) times daily., Disp: 60 tablet, Rfl: 0   donepezil  (ARICEPT ) 10 MG tablet, Take 1 tablet (10 mg total) by mouth at bedtime., Disp: 90 tablet, Rfl: 0   fluticasone  (FLONASE ) 50 MCG/ACT nasal spray, Use 2 spray(s) in each nostril once daily, Disp: 16  g, Rfl: 0   lisinopril  (ZESTRIL ) 40 MG tablet, Take 1 tablet (40 mg total) by mouth daily., Disp: 90 tablet, Rfl: 3   magnesium gluconate (MAGONATE) 500 MG tablet, Take 500 mg by mouth daily at 6 (six) AM., Disp: , Rfl:    pregabalin  (LYRICA ) 25 MG capsule, Take 1 capsule (25 mg total) by mouth 2 (two) times daily., Disp: 60 capsule, Rfl: 5   rosuvastatin  (CRESTOR ) 5 MG tablet, Take on three times per week on Monday,Wednesday and Friday, Disp: 90 tablet, Rfl: 3    sertraline  (ZOLOFT ) 25 MG tablet, Take 2 tablets by mouth once daily, Disp: 180 tablet, Rfl: 1   tiZANidine  (ZANAFLEX ) 4 MG tablet, Take 1 tablet (4 mg total) by mouth 2 (two) times daily as needed., Disp: 30 tablet, Rfl: 5  Observations/Objective: Today's Vitals   05/18/23 1447  Weight: 170 lb (77.1 kg)  Height: 5\' 9"  (1.753 m)   Physical Exam  Assessment and Plan:  1. Degeneration of intervertebral disc of lumbar region with discogenic back pain (Primary) -  Chronic severe spinal stenosis at L3-L4 and moderately severe at L4-L5 with disc bulges and desiccation. Pain level 7-8/10. Previous injections provided temporary relief, last injection less effective. Plan to address knee issues first. - Follow up with Dr. Vance Gell post-knee surgery for spinal stenosis management.  2. Essential hypertension -  Blood pressure well-controlled with current medication. Home reading 128/78 mmHg. - Continue lisinopril  40 mg daily.  3. Recurrent major depressive disorder, in partial remission (HCC) -  Mood well-managed with current medication. - Continue Zoloft  50 mg daily.  4. Memory loss or impairment -  Managed with Aricept . Engages in cognitive exercises. - Continue Aricept . - Encourage cognitive exercises.  5. Biliary calculus of other site without obstruction -  Incidental gallstones on imaging. No symptoms or obstruction.  6. Fibroids -  Presence of uterine fibroids. No current gynecologist follow-up. Plan to address post knee and back issues. - Consider gynecologist follow-up after knee and back issues are managed.       Follow Up Instructions:   I discussed the assessment and treatment plan with the patient. The patient was provided an opportunity to ask questions, and all were answered. The patient agreed with the plan and demonstrated an understanding of the instructions.   The patient was advised to call back or seek an in-person evaluation if the symptoms worsen or if the  condition fails to improve as anticipated.  The above assessment and management plan was discussed with the patient. The patient verbalized understanding of and has agreed to the management plan.   Keifer Habib Medina-Vargas, NP

## 2023-05-23 NOTE — Progress Notes (Signed)
 COVID Vaccine received:  []  No [x]  Yes Date of any COVID positive Test in last 90 days: no PCP - Dinah Ngetich NP Cardiologist - n/a  Chest x-ray -  EKG -   Stress Test -  ECHO -  Cardiac Cath -   Bowel Prep - [x]  No  []   Yes ______  Pacemaker / ICD device [x]  No []  Yes   Spinal Cord Stimulator:[x]  No []  Yes       History of Sleep Apnea? [x]  No []  Yes   CPAP used?- [x]  No []  Yes    Does the patient monitor blood sugar?          [x]  No []  Yes  []  N/A  Patient has: [x]  NO Hx DM   []  Pre-DM                 []  DM1  []   DM2 Does patient have a [employer] or [employer]om? []  No []  Yes   Fasting Blood Sugar Ranges-  Checks Blood Sugar _____ times a day  GLP1 agonist / usual dose - no GLP1 instructions:  SGLT-2 inhibitors / usual dose - no SGLT-2 instructions:   Blood Thinner / Instructions:no Aspirin  Instructions:ASA 81 mg Recommend 5-7 days prior to surgery.  Comments:   Activity level: Patient is able to climb a flight of stairs without difficulty; [x]  No CP  [x]  No SOB, but  Patient can perform ADLs without assistance.   Anesthesia review:   Patient denies shortness of breath, fever, cough and chest pain at PAT appointment.  Patient verbalized understanding and agreement to the Pre-Surgical Instructions that were given to them at this PAT appointment. Patient was also educated of the need to review these PAT instructions again prior to his/her surgery.I reviewed the appropriate phone numbers to call if they have any and questions or concerns.

## 2023-05-23 NOTE — Patient Instructions (Signed)
 SURGICAL WAITING ROOM VISITATION  Patients having surgery or a procedure may have no more than 2 support people in the waiting area - these visitors may rotate.    Children under the age of 38 must have an adult with them who is not the patient.  Visitors with respiratory illnesses are discouraged from visiting and should remain at home.  If the patient needs to stay at the hospital during part of their recovery, the visitor guidelines for inpatient rooms apply. Pre-op nurse will coordinate an appropriate time for 1 support person to accompany patient in pre-op.  This support person may not rotate.    Please refer to the [facility] website for the visitor guidelines for Inpatients (after your surgery is over and you are in a regular room).       Your procedure is scheduled on: 06/02/23   Report to [facility] Main Entrance    Report to admitting at 9:45 AM   Call this number if you have problems the morning of surgery [phone]   Do not eat food :After Midnight.   After Midnight you may have the following liquids until 9:15 AM DAY OF SURGERY  Water Non-Citrus Juices (without pulp, NO RED-Apple, White grape, White cranberry) Black Coffee (NO MILK/CREAM OR CREAMERS, sugar ok)  Clear Tea (NO MILK/CREAM OR CREAMERS, sugar ok) regular and decaf                             Plain Jell-O (NO RED)                                           Fruit ices (not with fruit pulp, NO RED)                                     Popsicles (NO RED)                                                               Sports drinks like Gatorade (NO RED)                  The day of surgery:  Drink ONE (1) Pre-Surgery  G2 at 9:15 AM the morning of surgery. Drink in one sitting. Do not sip.  This drink was given to you during your hospital  pre-op appointment visit. Nothing else to drink after completing the  Pre-Surgery G2.     Oral Hygiene is also important to reduce your risk of infection.                                     Remember - BRUSH YOUR TEETH THE MORNING OF SURGERY WITH YOUR REGULAR TOOTHPASTE   Stop all vitamins and herbal supplements 7 days before surgery.   Take these medicines the morning of surgery with A SIP OF WATER: amlodipine , cetirizine (zyrtec ), pregabalin (lyrica ), Rosuvastatin , sertraline (zoloft )             You may not have  any metal on your body including hair pins, jewelry, and body piercing             Do not wear make-up, lotions, powders, perfumes/cologne, or deodorant  Do not wear nail polish including gel and S&S, artificial/acrylic nails, or any other type of covering on natural nails including finger and toenails. If you have artificial nails, gel coating, etc. that needs to be removed by a nail salon please have this removed prior to surgery or surgery may need to be canceled/ delayed if the surgeon/ anesthesia feels like they are unable to be safely monitored.   Do not shave  48 hours prior to surgery.    Do not bring valuables to the hospital. [facility] IS NOT             RESPONSIBLE   FOR VALUABLES.   Contacts, glasses, dentures or bridgework may not be worn into surgery.   Bring small overnight bag day of surgery.   DO NOT BRING YOUR HOME MEDICATIONS TO THE HOSPITAL. PHARMACY WILL DISPENSE MEDICATIONS LISTED ON YOUR MEDICATION LIST TO YOU DURING YOUR ADMISSION IN THE HOSPITAL!    Patients discharged on the day of surgery will not be allowed to drive home.  Someone NEEDS to stay with you for the first 24 hours after anesthesia.   Special Instructions: Bring a copy of your healthcare power of attorney and living will documents the day of surgery if you haven't scanned them before.              Please read over the following fact sheets you were given: IF YOU HAVE QUESTIONS ABOUT YOUR PRE-OP INSTRUCTIONS PLEASE CALL [phone] Ammon Bales   If you received a COVID test during your pre-op visit  it is requested that you wear a mask when out in  public, stay away from anyone that may not be feeling well and notify your surgeon if you develop symptoms. If you test positive for Covid or have been in contact with anyone that has tested positive in the last 10 days please notify you surgeon.      Pre-operative 5 CHG Bath Instructions   You can play a key role in reducing the risk of infection after surgery. Your skin needs to be as free of germs as possible. You can reduce the number of germs on your skin by washing with CHG (chlorhexidine gluconate) soap before surgery. CHG is an antiseptic soap that kills germs and continues to kill germs even after washing.   DO NOT use if you have an allergy to chlorhexidine/CHG or antibacterial soaps. If your skin becomes reddened or irritated, stop using the CHG and notify one of our RNs at [phone].   Please shower with the CHG soap starting 4 days before surgery using the following schedule:     Please keep in mind the following:  DO NOT shave, including legs and underarms, starting the day of your first shower.   You may shave your face at any point before/day of surgery.  Place clean sheets on your bed the day you start using CHG soap. Use a clean washcloth (not used since being washed) for each shower. DO NOT sleep with pets once you start using the CHG.   CHG Shower Instructions:  If you choose to wash your hair and private area, wash first with your normal shampoo/soap.  After you use shampoo/soap, rinse your hair and body thoroughly to remove shampoo/soap residue.  Turn the water OFF and apply  about 3 tablespoons (45 ml) of CHG soap to a CLEAN washcloth.  Apply CHG soap ONLY FROM YOUR NECK DOWN TO YOUR TOES (washing for 3-5 minutes)  DO NOT use CHG soap on face, private areas, open wounds, or sores.  Pay special attention to the area where your surgery is being performed.  If you are having back surgery, having someone wash your back for you may be helpful. Wait 2 minutes after CHG  soap is applied, then you may rinse off the CHG soap.  Pat dry with a clean towel  Put on clean clothes/pajamas   If you choose to wear lotion, please use ONLY the CHG-compatible lotions on the back of this paper.     Additional instructions for the day of surgery: DO NOT APPLY any lotions, deodorants, cologne, or perfumes.   Put on clean/comfortable clothes.  Brush your teeth.  Ask your nurse before applying any prescription medications to the skin.      CHG Compatible Lotions   Aveeno Moisturizing lotion  Cetaphil Moisturizing Cream  Cetaphil Moisturizing Lotion  Clairol Herbal Essence Moisturizing Lotion, Dry Skin  Clairol Herbal Essence Moisturizing Lotion, Extra Dry Skin  Clairol Herbal Essence Moisturizing Lotion, Normal Skin  Curel Age Defying Therapeutic Moisturizing Lotion with Alpha Hydroxy  Curel Extreme Care Body Lotion  Curel Soothing Hands Moisturizing Hand Lotion  Curel Therapeutic Moisturizing Cream, Fragrance-Free  Curel Therapeutic Moisturizing Lotion, Fragrance-Free  Curel Therapeutic Moisturizing Lotion, Original Formula  Eucerin Daily Replenishing Lotion  Eucerin Dry Skin Therapy Plus Alpha Hydroxy Crme  Eucerin Dry Skin Therapy Plus Alpha Hydroxy Lotion  Eucerin Original Crme  Eucerin Original Lotion  Eucerin Plus Crme Eucerin Plus Lotion  Eucerin TriLipid Replenishing Lotion  Keri Anti-Bacterial Hand Lotion  Keri Deep Conditioning Original Lotion Dry Skin Formula Softly Scented  Keri Deep Conditioning Original Lotion, Fragrance Free Sensitive Skin Formula  Keri Lotion Fast Absorbing Fragrance Free Sensitive Skin Formula  Keri Lotion Fast Absorbing Softly Scented Dry Skin Formula  Keri Original Lotion  Keri Skin Renewal Lotion Keri Silky Smooth Lotion  Keri Silky Smooth Sensitive Skin Lotion  Nivea Body Creamy Conditioning Oil  Nivea Body Extra Enriched Lotion  Nivea Body Original Lotion  Nivea Body Sheer Moisturizing Lotion Nivea Crme  Nivea  Skin Firming Lotion  NutraDerm 30 Skin Lotion  NutraDerm Skin Lotion  NutraDerm Therapeutic Skin Cream  NutraDerm Therapeutic Skin Lotion  ProShield Protective Hand Cream Incentive Spirometer  An incentive spirometer is a tool that can help keep your lungs clear and active. This tool measures how well you are filling your lungs with each breath. Taking long deep breaths may help reverse or decrease the chance of developing breathing (pulmonary) problems (especially infection) following: A long period of time when you are unable to move or be active. BEFORE THE PROCEDURE  If the spirometer includes an indicator to show your best effort, your nurse or respiratory therapist will set it to a desired goal. If possible, sit up straight or lean slightly forward. Try not to slouch. Hold the incentive spirometer in an upright position. INSTRUCTIONS FOR USE  Sit on the edge of your bed if possible, or sit up as far as you can in bed or on a chair. Hold the incentive spirometer in an upright position. Breathe out normally. Place the mouthpiece in your mouth and seal your lips tightly around it. Breathe in slowly and as deeply as possible, raising the piston or the ball toward the top of the column. Hold  your breath for 3-5 seconds or for as long as possible. Allow the piston or ball to fall to the bottom of the column. Remove the mouthpiece from your mouth and breathe out normally. Rest for a few seconds and repeat Steps 1 through 7 at least 10 times every 1-2 hours when you are awake. Take your time and take a few normal breaths between deep breaths. The spirometer may include an indicator to show your best effort. Use the indicator as a goal to work toward during each repetition. After each set of 10 deep breaths, practice coughing to be sure your lungs are clear. If you have an incision (the cut made at the time of surgery), support your incision when coughing by placing a pillow or rolled up towels  firmly against it. Once you are able to get out of bed, walk around indoors and cough well. You may stop using the incentive spirometer when instructed by your caregiver.  RISKS AND COMPLICATIONS Take your time so you do not get dizzy or light-headed. If you are in pain, you may need to take or ask for pain medication before doing incentive spirometry. It is harder to take a deep breath if you are having pain. AFTER USE Rest and breathe slowly and easily. It can be helpful to keep track of a log of your progress. Your caregiver can provide you with a simple table to help with this. If you are using the spirometer at home, follow these instructions: SEEK MEDICAL CARE IF:  You are having difficultly using the spirometer. You have trouble using the spirometer as often as instructed. Your pain medication is not giving enough relief while using the spirometer. You develop fever of 100.5 F (38.1 C) or higher. SEEK IMMEDIATE MEDICAL CARE IF:  You cough up bloody sputum that had not been present before. You develop fever of 102 F (38.9 C) or greater. You develop worsening pain at or near the incision site. MAKE SURE YOU:  Understand these instructions. Will watch your condition. Will get help right away if you are not doing well or get worse. Document Released: 05/09/2006 Document Revised: 03/21/2011 Document Reviewed: 07/10/2006 [facility] Patient Information 2014 [place], [place].

## 2023-05-23 NOTE — Progress Notes (Signed)
 Request for preop orders sent to Dr. Lucienne Ryder.

## 2023-05-25 ENCOUNTER — Encounter (HOSPITAL_COMMUNITY): Payer: Self-pay

## 2023-05-25 ENCOUNTER — Encounter (HOSPITAL_COMMUNITY)
Admission: RE | Admit: 2023-05-25 | Discharge: 2023-05-25 | Disposition: A | Discharge status: Home / Self Care | Source: Ambulatory Visit | Attending: Orthopaedic Surgery | Admitting: Orthopaedic Surgery

## 2023-05-25 ENCOUNTER — Other Ambulatory Visit: Payer: Self-pay

## 2023-05-25 VITALS — BP 144/92 | HR 83 | Temp 98.2°F | Resp 16 | Ht 69.0 in | Wt 173.0 lb

## 2023-05-25 DIAGNOSIS — Z01818 Encounter for other preprocedural examination: Secondary | ICD-10-CM | POA: Insufficient documentation

## 2023-05-25 DIAGNOSIS — M1711 Unilateral primary osteoarthritis, right knee: Secondary | ICD-10-CM | POA: Insufficient documentation

## 2023-05-25 DIAGNOSIS — W19XXXD Unspecified fall, subsequent encounter: Secondary | ICD-10-CM

## 2023-05-25 LAB — CBC
HCT: 39.8 % (ref 36.0–46.0)
Hemoglobin: 12.1 g/dL (ref 12.0–15.0)
MCH: 23.9 pg — ABNORMAL LOW (ref 26.0–34.0)
MCHC: 30.4 g/dL (ref 30.0–36.0)
MCV: 78.5 fL — ABNORMAL LOW (ref 80.0–100.0)
Platelets: 322 10*3/uL (ref 150–400)
RBC: 5.07 MIL/uL (ref 3.87–5.11)
RDW: 16.5 % — ABNORMAL HIGH (ref 11.5–15.5)
WBC: 7.5 10*3/uL (ref 4.0–10.5)
nRBC: 0 % (ref 0.0–0.2)

## 2023-05-25 LAB — BASIC METABOLIC PANEL WITH GFR
Anion gap: 6 (ref 5–15)
BUN: 11 mg/dL (ref 8–23)
CO2: 25 mmol/L (ref 22–32)
Calcium: 9.2 mg/dL (ref 8.9–10.3)
Chloride: 108 mmol/L (ref 98–111)
Creatinine, Ser: 0.59 mg/dL (ref 0.44–1.00)
GFR, Estimated: >60 mL/min (ref >60)
Glucose, Bld: 114 mg/dL — ABNORMAL HIGH (ref 70–99)
Potassium: 3.7 mmol/L (ref 3.5–5.1)
Sodium: 139 mmol/L (ref 135–145)

## 2023-05-25 LAB — SURGICAL PCR SCREEN
MRSA, PCR: NEGATIVE
Staphylococcus aureus: NEGATIVE

## 2023-06-01 ENCOUNTER — Telehealth: Payer: Self-pay | Admitting: *Deleted

## 2023-06-01 NOTE — Care Plan (Signed)
 [facility] RNCM call to patient to discuss her upcoming Right total knee arthroplasty with Dr. Lucienne Ryder at [facility] on 06/02/23. She is agreeable to case management. She plans on returning home with assistance from family (her son, son's girlfriend, other family) after discharge. She does have a RW that was her husbands. Checking on this to make sure it is ok to use for after surgery. Anticipate HHPT will be needed after a short hospital stay. Referral made to [facility] after choice provided. Reviewed all post op care instructions and questions answered. Will continue to follow for needs.

## 2023-06-01 NOTE — H&P (Signed)
 TOTAL KNEE ADMISSION H&P  Patient is being admitted for right total knee arthroplasty.  Subjective:  Chief Complaint:right knee pain.  HPI: Kaylee Warner, 78 y.o. female, has a history of pain and functional disability in the right knee due to arthritis and has failed non-surgical conservative treatments for greater than 12 weeks to includeNSAID's and/or analgesics, corticosteriod injections, viscosupplementation injections, flexibility and strengthening excercises, use of assistive devices, and activity modification.  Onset of symptoms was gradual, starting many years ago with gradually worsening course since that time. The patient noted prior procedures on the knee to include  arthroscopy and menisectomy on the right knee(s).  Patient currently rates pain in the right knee(s) at 10 out of 10 with activity. Patient has night pain, worsening of pain with activity and weight bearing, pain that interferes with activities of daily living, pain with passive range of motion, crepitus, and joint swelling.  Patient has evidence of subchondral sclerosis, periarticular osteophytes, and joint space narrowing by imaging studies. There is no active infection.  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 04/30/2019   Skin tag 04/30/2019   Depression 11/29/2017   Chronic back pain greater than 3 months duration 01/21/2017   Fall 01/21/2017   High risk  medication use 01/21/2017   Alopecia 01/21/2017   Seasonal allergic rhinitis due to pollen 08/30/2016   Essential hypertension 09/07/2015   Edema 05/09/2013   PTTD (posterior tibial tendon dysfunction) 05/09/2013   Unilateral primary osteoarthritis, right knee 05/09/2013   Hematoma of leg 10/04/2012   Chronic pain syndrome 10/04/2012   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   Knee pain 03/02/2011   Rotator cuff syndrome of right shoulder 06/22/2010   SPINAL STENOSIS 01/30/2008   Lumbar back pain with radiculopathy affecting right lower extremity 01/30/2008   SCIATICA 01/30/2008   SPONDYLOLYSIS 01/30/2008   SHOULDER PAIN 11/28/2007   IMPINGEMENT SYNDROME 11/28/2007   Acute thromboembolism of deep veins of lower extremity (HCC) 09/24/2007   DERANGEMENT OF ANTERIOR HORN OF LATERAL MENISCUS 06/26/2007   LOWER LEG, ARTHRITIS, DEGEN./OSTEO 05/24/2007   DERANGEMENT MENISCUS 05/24/2007   JOINT EFFUSION, RIGHT KNEE 05/24/2007   KNEE PAIN 05/24/2007   Past Medical History:  Diagnosis Date   Allergy    Anemia    Arthritis    Hypertension     Past Surgical History:  Procedure Laterality Date   BUNIONECTOMY Right    COLONOSCOPY     [facility] 2009     Dr. Alm Jacks    TOE SURGERY Right     No current facility-administered medications for this encounter.   Current Outpatient Medications  Medication Sig Dispense Refill Last Dose/Taking   amLODipine  (NORVASC ) 10 MG tablet Take 1 tablet (10 mg total) by mouth daily. 90 tablet 3 Taking   aspirin  EC 81 MG tablet Take 1 tablet (81 mg total) by mouth daily. 30 tablet 3 Taking   Aspirin -Acetaminophen -Caffeine (GOODY HEADACHE PO) Take 1 packet by mouth daily as needed (pain).   Taking As Needed   cetirizine  (ZYRTEC )  10 MG tablet Take 1 tablet by mouth once daily 90 tablet 1 Taking   cholecalciferol (VITAMIN D3) 25 MCG (1000 UNIT) tablet Take 1,000 Units by mouth daily.   Taking   cyanocobalamin (VITAMIN B12) 1000 MCG tablet Take 1,000 mcg by mouth once  a week.   Taking   diclofenac  Sodium (VOLTAREN ) 1 % GEL Apply 1 Application topically 4 (four) times daily as needed (pain).   Taking As Needed   donepezil  (ARICEPT ) 10 MG tablet Take 1 tablet (10 mg total) by mouth at bedtime. 90 tablet 0 Taking   fluticasone  (FLONASE ) 50 MCG/ACT nasal spray Use 2 spray(s) in each nostril once daily (Patient taking differently: Place 2 sprays into both nostrils 2 (two) times daily.) 16 g 0 Taking Differently   ibuprofen  (ADVIL ) 800 MG tablet Take 400 mg by mouth every 8 (eight) hours as needed for moderate pain (pain score 4-6).   Taking As Needed   lisinopril  (ZESTRIL ) 40 MG tablet Take 1 tablet (40 mg total) by mouth daily. 90 tablet 3 Taking   Magnesium 400 MG TABS Take 400 mg by mouth at bedtime.   Taking   pregabalin  (LYRICA ) 25 MG capsule Take 1 capsule (25 mg total) by mouth 2 (two) times daily. 60 capsule 5 Taking   rosuvastatin  (CRESTOR ) 5 MG tablet Take on three times per week on Monday,Wednesday and Friday 90 tablet 3 Taking   sertraline  (ZOLOFT ) 25 MG tablet Take 2 tablets by mouth once daily 180 tablet 1 Taking   tiZANidine  (ZANAFLEX ) 4 MG tablet Take 1 tablet (4 mg total) by mouth 2 (two) times daily as needed. (Patient taking differently: Take 4 mg by mouth at bedtime as needed for muscle spasms.) 30 tablet 5 Taking Differently   Allergies  Allergen Reactions   Celecoxib     blood pressure went up   Codeine Itching   Crestor  [Rosuvastatin  Calcium ]     Muscle cramping and achy, tolerates 5 mg 3 time weekly    Penicillins Rash    Social History   Tobacco Use   Smoking status: Never   Smokeless tobacco: Never  Substance Use Topics   Alcohol use: Not Currently    Comment: occasionally    Family History  Problem Relation Age of Onset   Diabetes Sister    Sjogren's syndrome Daughter    Diabetes Son    Hypertension Son    Diabetes Son    Diabetes Other        family history    Arthritis Other        family history    Colon polyps Neg  Hx    Colon cancer Neg Hx    Esophageal cancer Neg Hx    Stomach cancer Neg Hx    Rectal cancer Neg Hx      Review of Systems  Objective:  Physical Exam Vitals reviewed.  Constitutional:      Appearance: Normal appearance. She is normal weight.  HENT:     Head: Normocephalic and atraumatic.  Eyes:     Extraocular Movements: Extraocular movements intact.     Pupils: Pupils are equal, round, and reactive to light.  Cardiovascular:     Rate and Rhythm: Normal rate.  Pulmonary:     Effort: Pulmonary effort is normal.     Breath sounds: Normal breath sounds.  Abdominal:     Palpations: Abdomen is soft.  Musculoskeletal:     Cervical back: Normal range of motion and neck supple.  Right knee: Effusion, bony tenderness and crepitus present. Decreased range of motion. Tenderness present over the medial joint line and lateral joint line. Abnormal alignment and abnormal meniscus.  Neurological:     Mental Status: She is alert and oriented to person, place, and time.  Psychiatric:        Behavior: Behavior normal.     Vital signs in last 24 hours:    Labs:   Estimated body mass index is 25.55 kg/m as calculated from the following:   Height as of 05/25/23: 5\' 9"  (1.753 m).   Weight as of 05/25/23: 78.5 kg.   Imaging Review Plain radiographs demonstrate severe degenerative joint disease of the right knee(s). The overall alignment ismild valgus. The bone quality appears to be good for age and reported activity level.      Assessment/Plan:  End stage arthritis, right knee   The patient history, physical examination, clinical judgment of the provider and imaging studies are consistent with end stage degenerative joint disease of the right knee(s) and total knee arthroplasty is deemed medically necessary. The treatment options including medical management, injection therapy arthroscopy and arthroplasty were discussed at length. The risks and benefits of total knee  arthroplasty were presented and reviewed. The risks due to aseptic loosening, infection, stiffness, patella tracking problems, thromboembolic complications and other imponderables were discussed. The patient acknowledged the explanation, agreed to proceed with the plan and consent was signed. Patient is being admitted for inpatient treatment for surgery, pain control, PT, OT, prophylactic antibiotics, VTE prophylaxis, progressive ambulation and ADL's and discharge planning. The patient is planning to be discharged home with home health services

## 2023-06-01 NOTE — Telephone Encounter (Signed)
 [facility] RNCM pre-op  call completed.

## 2023-06-02 ENCOUNTER — Other Ambulatory Visit: Payer: Self-pay

## 2023-06-02 ENCOUNTER — Encounter (HOSPITAL_COMMUNITY): Payer: Self-pay | Admitting: Orthopaedic Surgery

## 2023-06-02 ENCOUNTER — Ambulatory Visit (HOSPITAL_COMMUNITY)

## 2023-06-02 ENCOUNTER — Inpatient Hospital Stay (HOSPITAL_COMMUNITY)
Admission: RE | Admit: 2023-06-02 | Discharge: 2023-06-05 | DRG: 470 | Disposition: A | Discharge status: Home Health | Attending: Orthopaedic Surgery | Admitting: Orthopaedic Surgery

## 2023-06-02 ENCOUNTER — Encounter (HOSPITAL_COMMUNITY)
Admission: RE | Disposition: A | Discharge status: Home Health | Payer: Self-pay | Source: Home / Self Care | Attending: Orthopaedic Surgery

## 2023-06-02 ENCOUNTER — Observation Stay (HOSPITAL_COMMUNITY)

## 2023-06-02 DIAGNOSIS — G894 Chronic pain syndrome: Secondary | ICD-10-CM | POA: Diagnosis not present

## 2023-06-02 DIAGNOSIS — R7303 Prediabetes: Secondary | ICD-10-CM | POA: Diagnosis present

## 2023-06-02 DIAGNOSIS — Z8489 Family history of other specified conditions: Secondary | ICD-10-CM | POA: Diagnosis not present

## 2023-06-02 DIAGNOSIS — Z833 Family history of diabetes mellitus: Secondary | ICD-10-CM

## 2023-06-02 DIAGNOSIS — Z471 Aftercare following joint replacement surgery: Secondary | ICD-10-CM | POA: Diagnosis not present

## 2023-06-02 DIAGNOSIS — Z880 Allergy status to penicillin: Secondary | ICD-10-CM

## 2023-06-02 DIAGNOSIS — Z7982 Long term (current) use of aspirin: Secondary | ICD-10-CM

## 2023-06-02 DIAGNOSIS — Z8249 Family history of ischemic heart disease and other diseases of the circulatory system: Secondary | ICD-10-CM

## 2023-06-02 DIAGNOSIS — E782 Mixed hyperlipidemia: Secondary | ICD-10-CM | POA: Diagnosis not present

## 2023-06-02 DIAGNOSIS — I10 Essential (primary) hypertension: Secondary | ICD-10-CM | POA: Diagnosis present

## 2023-06-02 DIAGNOSIS — M1711 Unilateral primary osteoarthritis, right knee: Principal | ICD-10-CM

## 2023-06-02 DIAGNOSIS — R110 Nausea: Secondary | ICD-10-CM | POA: Diagnosis not present

## 2023-06-02 DIAGNOSIS — Z888 Allergy status to other drugs, medicaments and biological substances status: Secondary | ICD-10-CM | POA: Diagnosis not present

## 2023-06-02 DIAGNOSIS — Z79899 Other long term (current) drug therapy: Secondary | ICD-10-CM | POA: Diagnosis not present

## 2023-06-02 DIAGNOSIS — Z886 Allergy status to analgesic agent status: Secondary | ICD-10-CM

## 2023-06-02 DIAGNOSIS — Z885 Allergy status to narcotic agent status: Secondary | ICD-10-CM | POA: Diagnosis not present

## 2023-06-02 DIAGNOSIS — G8918 Other acute postprocedural pain: Secondary | ICD-10-CM | POA: Diagnosis not present

## 2023-06-02 DIAGNOSIS — Z96651 Presence of right artificial knee joint: Secondary | ICD-10-CM

## 2023-06-02 DIAGNOSIS — F32A Depression, unspecified: Secondary | ICD-10-CM | POA: Diagnosis present

## 2023-06-02 HISTORY — PX: TOTAL KNEE ARTHROPLASTY: SHX125

## 2023-06-02 SURGERY — ARTHROPLASTY, KNEE, TOTAL
Anesthesia: Spinal | Site: Knee | Laterality: Right

## 2023-06-02 MED ORDER — VITAMIN B-12 1000 MCG PO TABS
1000.0000 ug | ORAL_TABLET | ORAL | Status: DC
  Administered 2023-06-03: 1000 ug via ORAL
  Filled 2023-06-02: qty 1

## 2023-06-02 MED ORDER — CHLORHEXIDINE GLUCONATE 0.12 % MT SOLN
15.0000 mL | Freq: Once | OROMUCOSAL | Status: AC
  Administered 2023-06-02: 15 mL via OROMUCOSAL

## 2023-06-02 MED ORDER — DEXAMETHASONE SODIUM PHOSPHATE 10 MG/ML IJ SOLN
INTRAMUSCULAR | Status: DC | PRN
  Administered 2023-06-02: 5 mg via INTRAVENOUS

## 2023-06-02 MED ORDER — ONDANSETRON HCL 4 MG/2ML IJ SOLN: 4.0000 mg | Freq: Four times a day (QID) | INTRAMUSCULAR | Status: DC | PRN

## 2023-06-02 MED ORDER — DOCUSATE SODIUM 100 MG PO CAPS
100.0000 mg | ORAL_CAPSULE | Freq: Two times a day (BID) | ORAL | Status: DC
  Administered 2023-06-02 – 2023-06-05 (×5): 100 mg via ORAL
  Filled 2023-06-02 (×6): qty 1

## 2023-06-02 MED ORDER — LISINOPRIL 20 MG PO TABS
40.0000 mg | ORAL_TABLET | Freq: Every day | ORAL | Status: DC
  Administered 2023-06-03 – 2023-06-05 (×3): 40 mg via ORAL
  Filled 2023-06-02 (×3): qty 2

## 2023-06-02 MED ORDER — CEFAZOLIN SODIUM-DEXTROSE 2-4 GM/100ML-% IV SOLN
2.0000 g | INTRAVENOUS | Status: AC
  Administered 2023-06-02: 2 g via INTRAVENOUS
  Filled 2023-06-02: qty 100

## 2023-06-02 MED ORDER — BUPIVACAINE IN DEXTROSE 0.75-8.25 % IT SOLN
INTRATHECAL | Status: DC | PRN
  Administered 2023-06-02: 1.6 mL via INTRATHECAL

## 2023-06-02 MED ORDER — FENTANYL CITRATE PF 50 MCG/ML IJ SOSY
PREFILLED_SYRINGE | INTRAMUSCULAR | Status: AC
  Filled 2023-06-02: qty 1

## 2023-06-02 MED ORDER — ACETAMINOPHEN 10 MG/ML IV SOLN
INTRAVENOUS | Status: AC
  Filled 2023-06-02: qty 100

## 2023-06-02 MED ORDER — SODIUM CHLORIDE 0.9 % IV SOLN: INTRAVENOUS | Status: AC

## 2023-06-02 MED ORDER — OXYCODONE HCL 5 MG PO TABS
10.0000 mg | ORAL_TABLET | ORAL | Status: DC | PRN
  Administered 2023-06-03 (×2): 15 mg via ORAL
  Filled 2023-06-02 (×2): qty 3

## 2023-06-02 MED ORDER — MAGNESIUM OXIDE -MG SUPPLEMENT 400 (240 MG) MG PO TABS
400.0000 mg | ORAL_TABLET | Freq: Every day | ORAL | Status: DC
Start: 2023-06-02 — End: 2023-06-05
  Administered 2023-06-02 – 2023-06-04 (×3): 400 mg via ORAL
  Filled 2023-06-02 (×3): qty 1

## 2023-06-02 MED ORDER — ACETAMINOPHEN 325 MG PO TABS: 325.0000 mg | ORAL_TABLET | Freq: Four times a day (QID) | ORAL | Status: DC | PRN

## 2023-06-02 MED ORDER — PREGABALIN 25 MG PO CAPS
25.0000 mg | ORAL_CAPSULE | Freq: Two times a day (BID) | ORAL | Status: DC
  Administered 2023-06-02 – 2023-06-05 (×6): 25 mg via ORAL
  Filled 2023-06-02 (×6): qty 1

## 2023-06-02 MED ORDER — VITAMIN D 25 MCG (1000 UNIT) PO TABS
1000.0000 [IU] | ORAL_TABLET | Freq: Every day | ORAL | Status: DC
  Administered 2023-06-03 – 2023-06-05 (×3): 1000 [IU] via ORAL
  Filled 2023-06-02 (×3): qty 1

## 2023-06-02 MED ORDER — SERTRALINE HCL 50 MG PO TABS
50.0000 mg | ORAL_TABLET | Freq: Every day | ORAL | Status: DC
  Administered 2023-06-03 – 2023-06-05 (×3): 50 mg via ORAL
  Filled 2023-06-02 (×3): qty 1

## 2023-06-02 MED ORDER — BUPIVACAINE-EPINEPHRINE (PF) 0.25% -1:200000 IJ SOLN
INTRAMUSCULAR | Status: AC
  Filled 2023-06-02: qty 30

## 2023-06-02 MED ORDER — PROPOFOL 1000 MG/100ML IV EMUL
INTRAVENOUS | Status: AC
  Filled 2023-06-02: qty 100

## 2023-06-02 MED ORDER — ORAL CARE MOUTH RINSE: 15.0000 mL | Freq: Once | OROMUCOSAL | Status: AC

## 2023-06-02 MED ORDER — ONDANSETRON HCL 4 MG PO TABS
4.0000 mg | ORAL_TABLET | Freq: Four times a day (QID) | ORAL | Status: DC | PRN
  Administered 2023-06-03 – 2023-06-05 (×4): 4 mg via ORAL
  Filled 2023-06-02 (×4): qty 1

## 2023-06-02 MED ORDER — BUPIVACAINE-EPINEPHRINE 0.25% -1:200000 IJ SOLN
INTRAMUSCULAR | Status: DC | PRN
  Administered 2023-06-02: 30 mL

## 2023-06-02 MED ORDER — MIDAZOLAM HCL 2 MG/2ML IJ SOLN: 2.0000 mg | Freq: Once | INTRAMUSCULAR | Status: DC

## 2023-06-02 MED ORDER — SODIUM CHLORIDE 0.9 % IR SOLN
Status: DC | PRN
  Administered 2023-06-02: 1000 mL

## 2023-06-02 MED ORDER — OXYCODONE HCL 5 MG PO TABS
5.0000 mg | ORAL_TABLET | ORAL | Status: DC | PRN
  Administered 2023-06-02: 10 mg via ORAL
  Filled 2023-06-02: qty 2

## 2023-06-02 MED ORDER — ONDANSETRON HCL 4 MG/2ML IJ SOLN
INTRAMUSCULAR | Status: AC
Start: 2023-06-02 — End: ?
  Filled 2023-06-02: qty 2

## 2023-06-02 MED ORDER — METHOCARBAMOL 1000 MG/10ML IJ SOLN: 500.0000 mg | Freq: Four times a day (QID) | INTRAMUSCULAR | Status: DC | PRN

## 2023-06-02 MED ORDER — CEFAZOLIN SODIUM-DEXTROSE 2-4 GM/100ML-% IV SOLN
2.0000 g | Freq: Four times a day (QID) | INTRAVENOUS | Status: AC
  Administered 2023-06-02 – 2023-06-03 (×2): 2 g via INTRAVENOUS
  Filled 2023-06-02 (×2): qty 100

## 2023-06-02 MED ORDER — LACTATED RINGERS IV SOLN: INTRAVENOUS | Status: DC

## 2023-06-02 MED ORDER — PROPOFOL 500 MG/50ML IV EMUL
INTRAVENOUS | Status: DC | PRN
  Administered 2023-06-02: 65 ug/kg/min via INTRAVENOUS

## 2023-06-02 MED ORDER — PHENYLEPHRINE 80 MCG/ML (10ML) SYRINGE FOR IV PUSH (FOR BLOOD PRESSURE SUPPORT)
PREFILLED_SYRINGE | INTRAVENOUS | Status: DC | PRN
  Administered 2023-06-02 (×7): 80 ug via INTRAVENOUS

## 2023-06-02 MED ORDER — ASPIRIN 81 MG PO CHEW
81.0000 mg | CHEWABLE_TABLET | Freq: Two times a day (BID) | ORAL | Status: DC
  Administered 2023-06-02 – 2023-06-05 (×6): 81 mg via ORAL
  Filled 2023-06-02 (×6): qty 1

## 2023-06-02 MED ORDER — AMISULPRIDE (ANTIEMETIC) 5 MG/2ML IV SOLN: 10.0000 mg | Freq: Once | INTRAVENOUS | Status: DC | PRN

## 2023-06-02 MED ORDER — ROPIVACAINE HCL 5 MG/ML IJ SOLN
INTRAMUSCULAR | Status: DC | PRN
  Administered 2023-06-02: 20 mL via PERINEURAL

## 2023-06-02 MED ORDER — TRANEXAMIC ACID-NACL 1000-0.7 MG/100ML-% IV SOLN
1000.0000 mg | INTRAVENOUS | Status: AC
  Administered 2023-06-02: 1000 mg via INTRAVENOUS
  Filled 2023-06-02: qty 100

## 2023-06-02 MED ORDER — DIPHENHYDRAMINE HCL 12.5 MG/5ML PO ELIX
12.5000 mg | ORAL_SOLUTION | ORAL | Status: DC | PRN
  Administered 2023-06-03: 25 mg via ORAL
  Filled 2023-06-02: qty 10

## 2023-06-02 MED ORDER — DONEPEZIL HCL 10 MG PO TABS
10.0000 mg | ORAL_TABLET | Freq: Every day | ORAL | Status: DC
  Administered 2023-06-02 – 2023-06-04 (×3): 10 mg via ORAL
  Filled 2023-06-02 (×3): qty 1

## 2023-06-02 MED ORDER — PHENOL 1.4 % MT LIQD: 1.0000 | OROMUCOSAL | Status: DC | PRN

## 2023-06-02 MED ORDER — DEXAMETHASONE SODIUM PHOSPHATE 10 MG/ML IJ SOLN
INTRAMUSCULAR | Status: AC
  Filled 2023-06-02: qty 1

## 2023-06-02 MED ORDER — ACETAMINOPHEN 10 MG/ML IV SOLN
INTRAVENOUS | Status: DC | PRN
  Administered 2023-06-02: 1000 mg via INTRAVENOUS

## 2023-06-02 MED ORDER — HYDROMORPHONE HCL 1 MG/ML IJ SOLN
0.5000 mg | INTRAMUSCULAR | Status: DC | PRN
Start: 2023-06-02 — End: 2023-06-05
  Administered 2023-06-02: 0.5 mg via INTRAVENOUS
  Filled 2023-06-02: qty 1

## 2023-06-02 MED ORDER — METOCLOPRAMIDE HCL 5 MG PO TABS
5.0000 mg | ORAL_TABLET | Freq: Three times a day (TID) | ORAL | Status: DC | PRN
  Administered 2023-06-03: 10 mg via ORAL
  Filled 2023-06-02: qty 2

## 2023-06-02 MED ORDER — 0.9 % SODIUM CHLORIDE (POUR BTL) OPTIME
TOPICAL | Status: DC | PRN
  Administered 2023-06-02: 1000 mL

## 2023-06-02 MED ORDER — PHENYLEPHRINE 80 MCG/ML (10ML) SYRINGE FOR IV PUSH (FOR BLOOD PRESSURE SUPPORT)
PREFILLED_SYRINGE | INTRAVENOUS | Status: AC
Start: 2023-06-02 — End: ?
  Filled 2023-06-02: qty 10

## 2023-06-02 MED ORDER — STERILE WATER FOR IRRIGATION IR SOLN
Status: DC | PRN
  Administered 2023-06-02: 1000 mL

## 2023-06-02 MED ORDER — AMLODIPINE BESYLATE 10 MG PO TABS
10.0000 mg | ORAL_TABLET | Freq: Every day | ORAL | Status: DC
  Administered 2023-06-03 – 2023-06-05 (×3): 10 mg via ORAL
  Filled 2023-06-02 (×3): qty 1

## 2023-06-02 MED ORDER — ALUM & MAG HYDROXIDE-SIMETH 200-200-20 MG/5ML PO SUSP: 30.0000 mL | ORAL | Status: DC | PRN

## 2023-06-02 MED ORDER — METOCLOPRAMIDE HCL 5 MG/ML IJ SOLN: 5.0000 mg | Freq: Three times a day (TID) | INTRAMUSCULAR | Status: DC | PRN

## 2023-06-02 MED ORDER — METHOCARBAMOL 500 MG PO TABS
500.0000 mg | ORAL_TABLET | Freq: Four times a day (QID) | ORAL | Status: DC | PRN
  Administered 2023-06-02 – 2023-06-05 (×7): 500 mg via ORAL
  Filled 2023-06-02 (×7): qty 1

## 2023-06-02 MED ORDER — POVIDONE-IODINE 10 % EX SWAB: 2.0000 | Freq: Once | CUTANEOUS | Status: DC

## 2023-06-02 MED ORDER — ONDANSETRON HCL 4 MG/2ML IJ SOLN
INTRAMUSCULAR | Status: DC | PRN
  Administered 2023-06-02: 4 mg via INTRAVENOUS

## 2023-06-02 MED ORDER — FENTANYL CITRATE PF 50 MCG/ML IJ SOSY
25.0000 ug | PREFILLED_SYRINGE | INTRAMUSCULAR | Status: DC | PRN
  Administered 2023-06-02: 50 ug via INTRAVENOUS

## 2023-06-02 MED ORDER — MENTHOL 3 MG MT LOZG: 1.0000 | LOZENGE | OROMUCOSAL | Status: DC | PRN

## 2023-06-02 MED ORDER — PANTOPRAZOLE SODIUM 40 MG PO TBEC
40.0000 mg | DELAYED_RELEASE_TABLET | Freq: Every day | ORAL | Status: DC
  Administered 2023-06-03 – 2023-06-05 (×3): 40 mg via ORAL
  Filled 2023-06-02 (×3): qty 1

## 2023-06-02 MED ORDER — CLONIDINE HCL (ANALGESIA) 100 MCG/ML EP SOLN
EPIDURAL | Status: DC | PRN
  Administered 2023-06-02: 50 ug

## 2023-06-02 MED ORDER — FENTANYL CITRATE PF 50 MCG/ML IJ SOSY
100.0000 ug | PREFILLED_SYRINGE | Freq: Once | INTRAMUSCULAR | Status: AC
  Administered 2023-06-02: 50 ug via INTRAVENOUS
  Filled 2023-06-02: qty 2

## 2023-06-02 SURGICAL SUPPLY — 56 items
BAG COUNTER SPONGE SURGICOUNT (BAG) IMPLANT
BAG ZIPLOCK 12X15 (MISCELLANEOUS) ×1 IMPLANT
BENZOIN TINCTURE PRP APPL 2/3 (GAUZE/BANDAGES/DRESSINGS) IMPLANT
BLADE SAG 18X100X1.27 (BLADE) ×1 IMPLANT
BLADE SURG SZ10 CARB STEEL (BLADE) IMPLANT
BNDG ELASTIC 4X5.8 VLCR NS LF (GAUZE/BANDAGES/DRESSINGS) IMPLANT
BNDG ELASTIC 6INX 5YD STR LF (GAUZE/BANDAGES/DRESSINGS) ×2 IMPLANT
BOWL SMART MIX CTS (DISPOSABLE) IMPLANT
CEMENT BONE R 1X40 (Cement) IMPLANT
COMPONENT FEM CMT PRSONA SZ9RT (Joint) IMPLANT
COMPONET TIB PS KNEE E 0D RT (Joint) IMPLANT
COOLER ICEMAN CLASSIC (MISCELLANEOUS) ×1 IMPLANT
COVER SURGICAL LIGHT HANDLE (MISCELLANEOUS) ×1 IMPLANT
CUFF TRNQT CYL 34X4.125X (TOURNIQUET CUFF) ×1 IMPLANT
DRAPE INCISE IOBAN 66X45 STRL (DRAPES) ×1 IMPLANT
DRAPE U-SHAPE 47X51 STRL (DRAPES) ×1 IMPLANT
DRSG AQUACEL AG ADV 3.5X10 (GAUZE/BANDAGES/DRESSINGS) IMPLANT
DURAPREP 26ML APPLICATOR (WOUND CARE) ×1 IMPLANT
ELECT BLADE TIP CTD 4 INCH (ELECTRODE) ×1 IMPLANT
ELECT PENCIL ROCKER SW 15FT (MISCELLANEOUS) ×1 IMPLANT
ELECT REM PT RETURN 15FT ADLT (MISCELLANEOUS) ×1 IMPLANT
GAUZE PAD ABD 8X10 STRL (GAUZE/BANDAGES/DRESSINGS) ×2 IMPLANT
GAUZE SPONGE 4X4 12PLY STRL (GAUZE/BANDAGES/DRESSINGS) ×1 IMPLANT
GAUZE XEROFORM 1X8 LF (GAUZE/BANDAGES/DRESSINGS) IMPLANT
GLOVE BIO SURGEON STRL SZ7.5 (GLOVE) ×1 IMPLANT
GLOVE BIOGEL PI IND STRL 8 (GLOVE) ×2 IMPLANT
GLOVE ECLIPSE 8.0 STRL XLNG CF (GLOVE) ×1 IMPLANT
GOWN STRL REUS W/ TWL XL LVL3 (GOWN DISPOSABLE) ×2 IMPLANT
HOLDER FOLEY CATH W/STRAP (MISCELLANEOUS) IMPLANT
IMMOBILIZER KNEE 20 (SOFTGOODS) ×1 IMPLANT
IMMOBILIZER KNEE 20 THIGH 36 (SOFTGOODS) ×1 IMPLANT
INSERT TIB ASF SZ8-11 14 RT (Insert) IMPLANT
INSERT TIB BEAR EF/8-11 16 RT (Insert) IMPLANT
KIT TURNOVER KIT A (KITS) IMPLANT
MANIFOLD NEPTUNE II (INSTRUMENTS) ×1 IMPLANT
NS IRRIG 1000ML POUR BTL (IV SOLUTION) ×1 IMPLANT
PACK TOTAL KNEE CUSTOM (KITS) ×1 IMPLANT
PAD COLD SHLDR WRAP-ON (PAD) ×1 IMPLANT
PADDING CAST COTTON 6X4 STRL (CAST SUPPLIES) ×2 IMPLANT
PIN DRILL HDLS TROCAR 75 4PK (PIN) IMPLANT
PROTECTOR NERVE ULNAR (MISCELLANEOUS) ×1 IMPLANT
SCREW FEMALE HEX FIX 25X2.5 (ORTHOPEDIC DISPOSABLE SUPPLIES) IMPLANT
SET HNDPC FAN SPRY TIP SCT (DISPOSABLE) ×1 IMPLANT
SET PAD KNEE POSITIONER (MISCELLANEOUS) ×1 IMPLANT
SPIKE FLUID TRANSFER (MISCELLANEOUS) IMPLANT
STAPLER SKIN PROX 35W (STAPLE) IMPLANT
STEM POLY PAT PLY 32M KNEE (Knees) IMPLANT
STRIP CLOSURE SKIN 1/2X4 (GAUZE/BANDAGES/DRESSINGS) IMPLANT
SUT MNCRL AB 4-0 PS2 18 (SUTURE) IMPLANT
SUT VIC AB 0 CT1 36 (SUTURE) ×1 IMPLANT
SUT VIC AB 1 CT1 36 (SUTURE) ×2 IMPLANT
SUT VIC AB 2-0 CT1 TAPERPNT 27 (SUTURE) ×2 IMPLANT
TOWEL GREEN STERILE FF (TOWEL DISPOSABLE) ×1 IMPLANT
TRAY FOLEY MTR SLVR 16FR STAT (SET/KITS/TRAYS/PACK) IMPLANT
WATER STERILE IRR 1000ML POUR (IV SOLUTION) ×2 IMPLANT
YANKAUER SUCT BULB TIP NO VENT (SUCTIONS) ×1 IMPLANT

## 2023-06-02 NOTE — Anesthesia Procedure Notes (Signed)
 Anesthesia Regional Block: Adductor canal block   Pre-Anesthetic Checklist: , timeout performed,  Correct Patient, Correct Site, Correct Laterality,  Correct Procedure, Correct Position, site marked,  Risks and benefits discussed,  Surgical consent,  Pre-op evaluation,  At surgeon's request and post-op pain management  Laterality: Right  Prep: chloraprep       Needles:  Injection technique: Single-shot  Needle Type: Echogenic Needle     Needle Length: 9cm  Needle Gauge: 21     Additional Needles:   Procedures:,,,, ultrasound used (permanent image in chart),,    Narrative:  Start time: 06/02/2023 11:55 AM End time: 06/02/2023 12:00 PM Injection made incrementally with aspirations every 5 mL.  Performed by: Personally  Anesthesiologist: Peggy Bowens, MD

## 2023-06-02 NOTE — Transfer of Care (Signed)
 Immediate Anesthesia Transfer of Care Note  Patient: Kaylee Warner  Procedure(s) Performed: ARTHROPLASTY, KNEE, TOTAL (Right: Knee)  Patient Location: PACU  Anesthesia Type:Spinal  Level of Consciousness: awake  Airway & Oxygen Therapy: Patient Spontanous Breathing  Post-op Assessment: Report given to RN and Post -op Vital signs reviewed and stable  Post vital signs: Reviewed and stable  Last Vitals:  Vitals Value Taken Time  BP 111/74 06/02/23 1440  Temp    Pulse 72 06/02/23 1442  Resp 16 06/02/23 1442  SpO2 93 % 06/02/23 1442  Vitals shown include unfiled device data.  Last Pain:  Vitals:   06/02/23 1017  TempSrc: Oral  PainSc:          Complications: No notable events documented.

## 2023-06-02 NOTE — Interval H&P Note (Signed)
 History and Physical Interval Note: Patient understands that she is here today for a right total knee replacement to treat her significant right knee pain and arthritis.  There has been no acute or interval change in her medical status.  The risks and benefits of surgery have been discussed in detail and informed consent has been obtained.  The right operative knee has been marked.  06/02/2023 11:23 AM  Kaylee Warner  has presented today for surgery, with the diagnosis of osteoarthritis right knee.  The various methods of treatment have been discussed with the patient and family. After consideration of risks, benefits and other options for treatment, the patient has consented to  Procedure(s): ARTHROPLASTY, KNEE, TOTAL (Right) as a surgical intervention.  The patient's history has been reviewed, patient examined, no change in status, stable for surgery.  I have reviewed the patient's chart and labs.  Questions were answered to the patient's satisfaction.     Arnie Lao

## 2023-06-02 NOTE — Op Note (Signed)
 Operative Note  Date of operation: 06/02/2023 Preoperative diagnosis: Right knee primary osteoarthritis Postoperative diagnosis: Same  Procedure: Right cemented total knee arthroplasty  Implants: Biomet/Zimmer persona cemented knee system Implant Name Type Inv. Item Serial No. Manufacturer Lot No. LRB No. Used Action  COMPONET TIB PS KNEE E 0D RT - [id number] Joint COMPONET TIB PS KNEE E 0D RT  ZIMMER RECON(ORTH,TRAU,BIO,SG) [id number] Right 1 Implanted  STEM POLY PAT PLY 46M KNEE - [id number] Knees STEM POLY PAT PLY 46M KNEE  ZIMMER RECON(ORTH,TRAU,BIO,SG) [id number] Right 1 Implanted  COMPONENT FEM CMT PRSONA SZ9RT - [id number] Joint COMPONENT FEM CMT PRSONA SZ9RT  ZIMMER RECON(ORTH,TRAU,BIO,SG) [id number] Right 1 Implanted  INSERT TIB ASF SZ8-11 14 RT - [id number] Insert INSERT TIB ASF SZ8-11 14 RT  ZIMMER RECON(ORTH,TRAU,BIO,SG) [id number] Right 1 Implanted  CEMENT BONE R 1X40 - [id number] Cement CEMENT BONE R 1X40  ZIMMER RECON(ORTH,TRAU,BIO,SG) [id number] Right 2 Implanted  INSERT TIB BEAR EF/8-11 16 RT - [id number] Insert INSERT TIB BEAR EF/8-11 16 RT  ZIMMER RECON(ORTH,TRAU,BIO,SG) [id number] Right 1 Implanted   Surgeon: Jeanella Milan. Lucienne Ryder, MD Assistant: Malena Scull, PA-C  Anesthesia: #1 right lower extremity adductor canal block, #2 spinal, #3 local Tourniquet time: Under 1 hour EBL: Less than 50 cc Antibiotics: IV Ancef Complications: None  Indications: The patient is a 78 year old female with debilitating arthritis of her right knee with bone-on-bone wear on x-rays and significant valgus malalignment.  She has tried and failed all forms conservative treatment.  At this point her right knee pain is daily and it is definitely affecting her mobility, her quality of life and her actives daily living.  She does wish to proceed with a knee replacement we agree with this as well given her clinical exam findings and x-ray findings and the failure of conservative treatment.  We did discuss  the risks of acute blood loss anemia, nerve vessel injury, fracture, infection, DVT, implant failure and wound healing issues.  She understands that our goals are hopefully a stable knee with decreased pain, improved mobility and improved quality of life.  Procedure description: After informed consent was obtained and the appropriate right knee was marked, anesthesia obtained a right lower extremity adductor canal block in the holding room.  The patient was then brought to the operating room and set up on the stretcher where spinal anesthesia was obtained.  She was then laid in supine position on the stretcher and a Foley catheter was placed.  A nonsterile tractors placed around her upper right thigh and her right thigh, knee, leg, ankle and foot were prepped and draped in DuraPrep and sterile drapes including a sterile stockinette.  A timeout was called and she was identified as the correct patient the correct right knee.  We then used Esmarch wrap of the leg and the tourniquet was plated to 300 mm of pressure.  With the knee extended a direct midline incision was made over the patella and carried proximally distally.  Dissection was carried down the joint and a medial parapatellar arthrotomy was made.  We found significant cartilage loss throughout the knee with valgus malalignment.  We removed remnants of the ACL and medial lateral meniscus as well as large osteophytes in all 3 compartments.  We then used an extramedullary based cutting guide for making our proximal tibia cut correction for varus and valgus and a 7 degree slope.  We made this cut to take 2 mm off the low side and we needed to backed this down actually for more millimeters due  to the tightness.  We then used an intramedullary based cutting guide for distal femur cut setting this for right knee with slight external rotation given her valgus malalignment and for a 10 mm distal femoral cut.  We then had to backed this cut down to more millimeters  as well.  After that a 10 mm extension block showed full extension.  We then back to the femur and used the femoral sizing guide based off the epicondylar axis.  Based off of this we chose a size 9 femur.  We put a 4-in-1 cutting block for a size 9 femur and we made our anterior and posterior cuts of our chamfer cuts.  We then back to the tibia and chose a size E tibial tray for right knee for coverage of the tibial plateau setting the rotation of the tibial tubercle and the femur.  We did our drill hole and keel punch off of this.  We then trialed our size 9 right standard CR femur combined with our size of the right tibia.  We trialed up to a 14 mm thickness polyethylene insert and we are pleased with the stability and range of motion of that insert.  We then made a patella cut and drilled 3 holes for a size 32 patella button again with all trialing rotation a we felt it was a stable knee.  We then removed all transportation for the knee and irrigated the knee with normal saline solution.  We then placed Marcaine  with epinephrine  around the arthrotomy.  Next with the knee in a flexed position we cemented our Biomet Zimmer persona tibial tray for right knee size E followed by cementing our size 9 standard CR right femur.  We placed our 14 mm medial congruent polythene liner and cemented our size 32 patella button.  We then held the knee extended and compressed to allow the cement to harden.  Once it hardened we tested stability of the knee and I felt like we needed to go up to a 16 mm thickness poly.  We removed the 14 mm thickness poly and placed a 16 mm thickness right medial congruent polythene liner and we are pleased with range of motion and stability with that liner.  We then let the tourniquet down and hemostasis was obtained with electrocautery.  The arthrotomy was closed with interrupted #1 Vicryl suture followed by 0 Vicryl goes deep tissue and 2-0 Vicryl because subcutaneous tissue.  The skin was closed  with staples.  Well-padded sterile dressing was applied.  The patient was taken the recovery room in stable condition.  Malena Scull, PA-C did assist during the entire case and beginning the end and his assistance was crucial and medically necessary for soft tissue management and retraction, helping guide implant placement and a layered closure of the wound.

## 2023-06-02 NOTE — Anesthesia Preprocedure Evaluation (Addendum)
 Anesthesia Evaluation  Patient identified by MRN, date of birth, ID band Patient awake    Reviewed: Allergy & Precautions, NPO status , Patient's Chart, lab work & pertinent test results  Airway Mallampati: II  TM Distance: >3 FB Neck ROM: Full    Dental   Pulmonary neg pulmonary ROS   Pulmonary exam normal        Cardiovascular hypertension, Pt. on medications  Rhythm:Regular Rate:Normal     Neuro/Psych  Neuromuscular disease    GI/Hepatic negative GI ROS, Neg liver ROS,,,  Endo/Other  negative endocrine ROS    Renal/GU negative Renal ROS     Musculoskeletal  (+) Arthritis ,    Abdominal   Peds  Hematology  (+) Blood dyscrasia, anemia   Anesthesia Other Findings   Reproductive/Obstetrics                             Anesthesia Physical Anesthesia Plan  ASA: 2  Anesthesia Plan: Spinal   Post-op Pain Management: Ofirmev  IV (intra-op)* and Regional block*   Induction:   PONV Risk Score and Plan: 2 and Propofol  infusion, Dexamethasone  and Ondansetron   Airway Management Planned: Natural Airway and Simple Face Mask  Additional Equipment: None  Intra-op Plan:   Post-operative Plan:   Informed Consent: I have reviewed the patients History and Physical, chart, labs and discussed the procedure including the risks, benefits and alternatives for the proposed anesthesia with the patient or authorized representative who has indicated his/her understanding and acceptance.       Plan Discussed with: CRNA  Anesthesia Plan Comments:        Anesthesia Quick Evaluation

## 2023-06-02 NOTE — Discharge Instructions (Signed)
 Per [facility] clinic policy, our goal is ensure optimal postoperative pain control with a multimodal pain management strategy. For all [facility] patients, our goal is to wean post-operative narcotic medications by 6 weeks post-operatively. If this is not possible due to utilization of pain medication prior to surgery, your [facility] doctor will support your acute post-operative pain control for the first 6 weeks postoperatively, with a plan to transition you back to your primary pain team following that. Kaylee Warner will work to ensure a Therapist, occupational.  INSTRUCTIONS AFTER JOINT REPLACEMENT   Remove items at home which could result in a fall. This includes throw rugs or furniture in walking pathways ICE to the affected joint every three hours while awake for 30 minutes at a time, for at least the first 3-5 days, and then as needed for pain and swelling.  Continue to use ice for pain and swelling. You may notice swelling that will progress down to the foot and ankle.  This is normal after surgery.  Elevate your leg when you are not up walking on it.   Continue to use the breathing machine you got in the hospital (incentive spirometer) which will help keep your temperature down.  It is common for your temperature to cycle up and down following surgery, especially at night when you are not up moving around and exerting yourself.  The breathing machine keeps your lungs expanded and your temperature down.   DIET:  As you were doing prior to hospitalization, we recommend a well-balanced diet.  DRESSING / WOUND CARE / SHOWERING  Keep the surgical dressing until follow up.  The dressing is water proof, so you can shower without any extra covering.  IF THE DRESSING FALLS OFF or the wound gets wet inside, change the dressing with sterile gauze.  Please use good hand washing techniques before changing the dressing.  Do not use any lotions or creams on the incision until instructed by your surgeon.     ACTIVITY  Increase activity slowly as tolerated, but follow the weight bearing instructions below.   No driving for 6 weeks or until further direction given by your physician.  You cannot drive while taking narcotics.  No lifting or carrying greater than 10 lbs. until further directed by your surgeon. Avoid periods of inactivity such as sitting longer than an hour when not asleep. This helps prevent blood clots.  You may return to work once you are authorized by your doctor.     WEIGHT BEARING   Weight bearing as tolerated with assist device (walker, cane, etc) as directed, use it as long as suggested by your surgeon or therapist, typically at least 4-6 weeks.   EXERCISES  Results after joint replacement surgery are often greatly improved when you follow the exercise, range of motion and muscle strengthening exercises prescribed by your doctor. Safety measures are also important to protect the joint from further injury. Any time any of these exercises cause you to have increased pain or swelling, decrease what you are doing until you are comfortable again and then slowly increase them. If you have problems or questions, call your caregiver or physical therapist for advice.   Rehabilitation is important following a joint replacement. After just a few days of immobilization, the muscles of the leg can become weakened and shrink (atrophy).  These exercises are designed to build up the tone and strength of the thigh and leg muscles and to improve motion. Often times heat used for twenty to thirty minutes before  working out will loosen up your tissues and help with improving the range of motion but do not use heat for the first two weeks following surgery (sometimes heat can increase post-operative swelling).   These exercises can be done on a training (exercise) mat, on the floor, on a table or on a bed. Use whatever works the best and is most comfortable for you.    Use music or television  while you are exercising so that the exercises are a pleasant break in your day. This will make your life better with the exercises acting as a break in your routine that you can look forward to.   Perform all exercises about fifteen times, three times per day or as directed.  You should exercise both the operative leg and the other leg as well.  Exercises include:   Quad Sets - Tighten up the muscle on the front of the thigh (Quad) and hold for 5-10 seconds.   Straight Leg Raises - With your knee straight (if you were given a brace, keep it on), lift the leg to 60 degrees, hold for 3 seconds, and slowly lower the leg.  Perform this exercise against resistance later as your leg gets stronger.  Leg Slides: Lying on your back, slowly slide your foot toward your buttocks, bending your knee up off the floor (only go as far as is comfortable). Then slowly slide your foot back down until your leg is flat on the floor again.  Angel Wings: Lying on your back spread your legs to the side as far apart as you can without causing discomfort.  Hamstring Strength:  Lying on your back, push your heel against the floor with your leg straight by tightening up the muscles of your buttocks.  Repeat, but this time bend your knee to a comfortable angle, and push your heel against the floor.  You may put a pillow under the heel to make it more comfortable if necessary.   A rehabilitation program following joint replacement surgery can speed recovery and prevent re-injury in the future due to weakened muscles. Contact your doctor or a physical therapist for more information on knee rehabilitation.    CONSTIPATION  Constipation is defined medically as fewer than three stools per week and severe constipation as less than one stool per week.  Even if you have a regular bowel pattern at home, your normal regimen is likely to be disrupted due to multiple reasons following surgery.  Combination of anesthesia, postoperative  narcotics, change in appetite and fluid intake all can affect your bowels.   YOU MUST use at least one of the following options; they are listed in order of increasing strength to get the job done.  They are all available over the counter, and you may need to use some, POSSIBLY even all of these options:    Drink plenty of fluids (prune juice may be helpful) and high fiber foods Colace 100 mg by mouth twice a day  Senokot for constipation as directed and as needed Dulcolax (bisacodyl), take with full glass of water  Miralax (polyethylene glycol) once or twice a day as needed.  If you have tried all these things and are unable to have a bowel movement in the first 3-4 days after surgery call either your surgeon or your primary doctor.    If you experience loose stools or diarrhea, hold the medications until you stool forms back up.  If your symptoms do not get better within 1 week  or if they get worse, check with your doctor.  If you experience "the worst abdominal pain ever" or develop nausea or vomiting, please contact the office immediately for further recommendations for treatment.   ITCHING:  If you experience itching with your medications, try taking only a single pain pill, or even half a pain pill at a time.  You can also use Benadryl over the counter for itching or also to help with sleep.   TED HOSE STOCKINGS:  Use stockings on both legs until for at least 2 weeks or as directed by physician office. They may be removed at night for sleeping.  MEDICATIONS:  See your medication summary on the "After Visit Summary" that nursing will review with you.  You may have some home medications which will be placed on hold until you complete the course of blood thinner medication.  It is important for you to complete the blood thinner medication as prescribed.  PRECAUTIONS:  If you experience chest pain or shortness of breath - call 911 immediately for transfer to the hospital emergency department.    If you develop a fever greater that 101 F, purulent drainage from wound, increased redness or drainage from wound, foul odor from the wound/dressing, or calf pain - CONTACT YOUR SURGEON.                                                   FOLLOW-UP APPOINTMENTS:  If you do not already have a post-op appointment, please call the office for an appointment to be seen by your surgeon.  Guidelines for how soon to be seen are listed in your "After Visit Summary", but are typically between 1-4 weeks after surgery.  OTHER INSTRUCTIONS:   Knee Replacement:  Do not place pillow under knee, focus on keeping the knee straight while resting. CPM instructions: 0-90 degrees, 2 hours in the morning, 2 hours in the afternoon, and 2 hours in the evening. Place foam block, curve side up under heel at all times except when in CPM or when walking.  DO NOT modify, tear, cut, or change the foam block in any way.  POST-OPERATIVE OPIOID TAPER INSTRUCTIONS: It is important to wean off of your opioid medication as soon as possible. If you do not need pain medication after your surgery it is ok to stop day one. Opioids include: Codeine, Hydrocodone(Norco, Vicodin), Oxycodone(Percocet, oxycontin) and hydromorphone amongst others.  Long term and even short term use of opiods can cause: Increased pain response Dependence Constipation Depression Respiratory depression And more.  Withdrawal symptoms can include Flu like symptoms Nausea, vomiting And more Techniques to manage these symptoms Hydrate well Eat regular healthy meals Stay active Use relaxation techniques(deep breathing, meditating, yoga) Do Not substitute Alcohol to help with tapering If you have been on opioids for less than two weeks and do not have pain than it is ok to stop all together.  Plan to wean off of opioids This plan should start within one week post op of your joint replacement. Maintain the same interval or time between taking each dose  and first decrease the dose.  Cut the total daily intake of opioids by one tablet each day Next start to increase the time between doses. The last dose that should be eliminated is the evening dose.   MAKE SURE YOU:  Understand these instructions.  Get help right away if you are not doing well or get worse.    Thank you for letting us be a part of your medical care team.  It is a privilege we respect greatly.  We hope these instructions will help you stay on track for a fast and full recovery!      Dental Antibiotics:  In most cases prophylactic antibiotics for Dental procdeures after total joint surgery are not necessary.  Exceptions are as follows:  1. History of prior total joint infection  2. Severely immunocompromised (Organ Transplant, cancer chemotherapy, Rheumatoid biologic meds such as Humera)  3. Poorly controlled diabetes (A1C &gt; 8.0, blood glucose over 200)  If you have one of these conditions, contact your surgeon for an antibiotic prescription, prior to your dental procedure.

## 2023-06-02 NOTE — Anesthesia Procedure Notes (Addendum)
 Spinal  Patient location during procedure: OR Start time: 06/02/2023 1:40 PM End time: 06/02/2023 1:46 PM Reason for block: surgical anesthesia Staffing Performed: anesthesiologist  Anesthesiologist: Peggy Bowens, MD Performed by: Peggy Bowens, MD Authorized by: Peggy Bowens, MD   Preanesthetic Checklist Completed: patient identified, IV checked, site marked, risks and benefits discussed, surgical consent, monitors and equipment checked, pre-op evaluation and timeout performed Spinal Block Patient position: sitting Prep: DuraPrep Patient monitoring: heart rate, cardiac monitor, continuous pulse ox and blood pressure Approach: midline Location: L4-5 Injection technique: single-shot Needle Needle type: Pencan  Needle gauge: 24 G Needle length: 9 cm Assessment Sensory level: T4 Events: CSF return

## 2023-06-03 LAB — CBC
HCT: 34.9 % — ABNORMAL LOW (ref 36.0–46.0)
Hemoglobin: 10.8 g/dL — ABNORMAL LOW (ref 12.0–15.0)
MCH: 24.1 pg — ABNORMAL LOW (ref 26.0–34.0)
MCHC: 30.9 g/dL (ref 30.0–36.0)
MCV: 77.7 fL — ABNORMAL LOW (ref 80.0–100.0)
Platelets: 279 10*3/uL (ref 150–400)
RBC: 4.49 MIL/uL (ref 3.87–5.11)
RDW: 15.9 % — ABNORMAL HIGH (ref 11.5–15.5)
WBC: 13.1 10*3/uL — ABNORMAL HIGH (ref 4.0–10.5)
nRBC: 0 % (ref 0.0–0.2)

## 2023-06-03 LAB — BASIC METABOLIC PANEL WITH GFR
Anion gap: 7 (ref 5–15)
BUN: 14 mg/dL (ref 8–23)
CO2: 23 mmol/L (ref 22–32)
Calcium: 8.6 mg/dL — ABNORMAL LOW (ref 8.9–10.3)
Chloride: 107 mmol/L (ref 98–111)
Creatinine, Ser: 0.57 mg/dL (ref 0.44–1.00)
GFR, Estimated: >60 mL/min (ref >60)
Glucose, Bld: 132 mg/dL — ABNORMAL HIGH (ref 70–99)
Potassium: 3.5 mmol/L (ref 3.5–5.1)
Sodium: 137 mmol/L (ref 135–145)

## 2023-06-03 MED ORDER — KETOROLAC TROMETHAMINE 15 MG/ML IJ SOLN
7.5000 mg | Freq: Four times a day (QID) | INTRAMUSCULAR | Status: AC
  Administered 2023-06-03 – 2023-06-04 (×3): 7.5 mg via INTRAVENOUS
  Filled 2023-06-03 (×3): qty 1

## 2023-06-03 MED ORDER — HYDROCODONE-ACETAMINOPHEN 5-325 MG PO TABS
1.0000 | ORAL_TABLET | ORAL | Status: DC | PRN
  Administered 2023-06-03 – 2023-06-05 (×8): 2 via ORAL
  Filled 2023-06-03 (×8): qty 2

## 2023-06-03 NOTE — Evaluation (Signed)
 Physical Therapy Evaluation Patient Details Name: Kaylee Warner MRN: [id number] DOB: [birth year] Today's Date: 06/03/2023  History of Present Illness  78 yo female s/p RTKA on 06/02/23. PMH: cervicalgia, R RD tear, spinal stenosis, DDD, depression, DVT,  Clinical Impression  Pt is s/p TKA resulting in the deficits listed below (see PT Problem List).  Pt continues to struggle with nausea and pain, however willing to work with PT despite this. Amb 25' with RW, min assist and incr time; anticipate steady progress once nausea resolved.   Pt will benefit from acute skilled PT to increase their independence and safety with mobility to allow discharge.          If plan is discharge home, recommend the following: A little help with walking and/or transfers;A little help with bathing/dressing/bathroom;Help with stairs or ramp for entrance;Assist for transportation;Assistance with cooking/housework   Can travel by private vehicle        Equipment Recommendations None recommended by PT  Recommendations for Other Services       Functional Status Assessment Patient has had a recent decline in their functional status and demonstrates the ability to make significant improvements in function in a reasonable and predictable amount of time.     Precautions / Restrictions Precautions Precautions: Fall;Knee Required Braces or Orthoses: Knee Immobilizer - Right Restrictions Weight Bearing Restrictions Per Provider Order: No Other Position/Activity Restrictions: WBAT      Mobility  Bed Mobility               General bed mobility comments: in recliner    Transfers Overall transfer level: Needs assistance Equipment used: Rolling walker (2 wheels) Transfers: Sit to/from Stand Sit to Stand: Min assist, Mod assist           General transfer comment: cues for RLE position and hand placement    Ambulation/Gait Ambulation/Gait assistance: Min assist Gait Distance (Feet): 24  Feet Assistive device: Rolling walker (2 wheels) Gait Pattern/deviations: Step-to pattern, Decreased stance time - right       General Gait Details: cues for sequence and RW position  Stairs            Wheelchair Mobility     Tilt Bed    Modified Rankin (Stroke Patients Only)       Balance Overall balance assessment: No apparent balance deficits (not formally assessed)                                           Pertinent Vitals/Pain Pain Assessment Pain Assessment: Faces Faces Pain Scale: Hurts whole lot Pain Location: R knee Pain Descriptors / Indicators: Aching, Sore, Constant Pain Intervention(s): Limited activity within patient's tolerance, Monitored during session, Premedicated before session    Home Living Family/patient expects to be discharged to:: Private residence Living Arrangements: Spouse/significant other Available Help at Discharge: Family;Available PRN/intermittently Type of Home: House Home Access: Stairs to enter   Entrance Stairs-Number of Steps: 2-3   Home Layout: Able to live on main level with bedroom/bathroom;Two level Home Equipment: Agricultural consultant (2 wheels);Wheelchair - manual      Prior Function Prior Level of Function : Independent/Modified Independent                     Extremity/Trunk Assessment   Upper Extremity Assessment Upper Extremity Assessment: Overall WFL for tasks assessed    Lower Extremity Assessment Lower Extremity  Assessment: RLE deficits/detail RLE Deficits / Details: ankle WFL, knee testing ltd d/t pain; ~2+/5 knee extension       Communication   Communication Communication: No apparent difficulties    Cognition Arousal: Alert Behavior During Therapy: WFL for tasks assessed/performed   PT - Cognitive impairments: No apparent impairments                         Following commands: Intact       Cueing Cueing Techniques: Verbal cues     General Comments       Exercises     Assessment/Plan    PT Assessment Patient needs continued PT services  PT Problem List Decreased strength;Decreased range of motion;Decreased activity tolerance;Decreased balance;Decreased knowledge of use of DME;Decreased mobility;Pain       PT Treatment Interventions DME instruction;Gait training;Stair training;Functional mobility training;Therapeutic activities;Patient/family education;Therapeutic exercise    PT Goals (Current goals can be found in the Care Plan section)  Acute Rehab PT Goals PT Goal Formulation: With patient Time For Goal Achievement: 06/10/23 Potential to Achieve Goals: Good    Frequency 7X/week     Co-evaluation               AM-PAC PT "6 Clicks" Mobility  Outcome Measure Help needed turning from your back to your side while in a flat bed without using bedrails?: A Little Help needed moving from lying on your back to sitting on the side of a flat bed without using bedrails?: A Little Help needed moving to and from a bed to a chair (including a wheelchair)?: A Lot Help needed standing up from a chair using your arms (e.g., wheelchair or bedside chair)?: A Lot Help needed to walk in hospital room?: A Little Help needed climbing 3-5 steps with a railing? : A Lot 6 Click Score: 15    End of Session Equipment Utilized During Treatment: Gait belt Activity Tolerance: Patient tolerated treatment well Patient left: with call bell/phone within reach;in chair;with chair alarm set   PT Visit Diagnosis: Other abnormalities of gait and mobility (R26.89);History of falling (Z91.81)    Time: 4098-1191 PT Time Calculation (min) (ACUTE ONLY): 25 min   Charges:   PT Evaluation $PT Eval Low Complexity: 1 Low PT Treatments $Gait Training: 8-22 mins PT General Charges $$ ACUTE PT VISIT: 1 Visit         Haniah Penny, PT  Acute Rehab Dept [facility]) [phone]  06/03/2023   [facility] 06/03/2023, 11:12 AM

## 2023-06-03 NOTE — Care Management Obs Status (Signed)
 MEDICARE OBSERVATION STATUS NOTIFICATION   Patient Details  Name: Kaylee Warner MRN: [id number] Date of Birth: 1945-12-13   Medicare Observation Status Notification Given:  Yes    Ariann Khaimov Liane Redman, LCSW 06/03/2023, 9:35 AM

## 2023-06-03 NOTE — Progress Notes (Signed)
 Patient ID: Kaylee Warner, female   DOB: July 24, 1945, 78 y.o.   MRN: [id number] Given her significant nausea, we will switch her to hydrocodone  from oxycodone .  Also, I have ordered Toradol  7.5 mg IV every 6 hours for 3 doses.  She does have an allergy to Celebrex but this should not impact her getting Toradol .  She is not safe for discharge to home today given this nausea.

## 2023-06-03 NOTE — Progress Notes (Signed)
 Subjective: 1 Day Post-Op Procedure(s) (LRB): ARTHROPLASTY, KNEE, TOTAL (Right) Patient reports pain as moderate to severe. Nausea this AM feels in due to pain medication.   Objective: Vital signs in last 24 hours: Temp:  [97.5 F (36.4 C)-98.5 F (36.9 C)] 98.3 F (36.8 C) (05/24 0530) Pulse Rate:  [60-74] 70 (05/24 0530) Resp:  [14-29] 17 (05/24 0530) BP: (106-146)/(62-85) 135/78 (05/24 0530) SpO2:  [89 %-98 %] 96 % (05/24 0530) Weight:  [78.5 kg] 78.5 kg (05/23 1011)  Intake/Output from previous day: 05/23 0701 - 05/24 0700 In: 2320 [P.O.:120; I.V.:1900; IV Piggyback:300] Out: 1275 [Urine:1225; Blood:50] Intake/Output this shift: No intake/output data recorded.  Recent Labs    06/03/23 0340  HGB 10.8*   Recent Labs    06/03/23 0340  WBC 13.1*  RBC 4.49  HCT 34.9*  PLT 279   Recent Labs    06/03/23 0340  NA 137  K 3.5  CL 107  CO2 23  BUN 14  CREATININE 0.57  GLUCOSE 132*  CALCIUM  8.6*   No results for input(s): "LABPT", "INR" in the last 72 hours.  Intact pulses distally Dorsiflexion/Plantar flexion intact Incision: no drainage   Assessment/Plan: 1 Day Post-Op Procedure(s) (LRB): ARTHROPLASTY, KNEE, TOTAL (Right) Up with therapy Change to Norco for pain management.  Will likely be discharged to home later this weekend pending progress with PT.       Herby Amick 06/03/2023, 9:47 AM

## 2023-06-03 NOTE — Progress Notes (Signed)
 Physical Therapy Treatment Patient Details Name: Kaylee Warner MRN: [id number] DOB: 1945/05/24 Today's Date: 06/03/2023   History of Present Illness 78 yo female s/p RTKA on 06/02/23. PMH: cervicalgia, R RD tear, spinal stenosis, DDD, depression, DVT,    PT Comments  Pt progressing well this afternoon, incr amb distance (40' with RW and min assist). Tolerated initiation of TKA exercises, ice to knee EOS. Expect continued progress,  pt likely ready to d/c tomorrow after 1-2 PT sessions.   If plan is discharge home, recommend the following: A little help with walking and/or transfers;A little help with bathing/dressing/bathroom;Help with stairs or ramp for entrance;Assist for transportation;Assistance with cooking/housework   Can travel by private vehicle        Equipment Recommendations  None recommended by PT    Recommendations for Other Services       Precautions / Restrictions Precautions Precautions: Fall;Knee Precaution Booklet Issued: No Recall of Precautions/Restrictions: Intact Required Braces or Orthoses: Knee Immobilizer - Right Restrictions Other Position/Activity Restrictions: WBAT     Mobility  Bed Mobility               General bed mobility comments: in recliner    Transfers Overall transfer level: Needs assistance Equipment used: Rolling walker (2 wheels) Transfers: Sit to/from Stand Sit to Stand: Min assist, Mod assist           General transfer comment: cues for RLE position and hand placement    Ambulation/Gait Ambulation/Gait assistance: Min assist Gait Distance (Feet): 40 Feet Assistive device: Rolling walker (2 wheels) Gait Pattern/deviations: Step-to pattern, Decreased stance time - right       General Gait Details: cues for sequence and RW position   Stairs             Wheelchair Mobility     Tilt Bed    Modified Rankin (Stroke Patients Only)       Balance Overall balance assessment: No apparent balance  deficits (not formally assessed)                                          Communication Communication Communication: No apparent difficulties  Cognition Arousal: Alert Behavior During Therapy: WFL for tasks assessed/performed   PT - Cognitive impairments: No apparent impairments                         Following commands: Intact      Cueing Cueing Techniques: Verbal cues  Exercises Total Joint Exercises Ankle Circles/Pumps: AROM, Both, 10 reps Quad Sets: AROM, Both, 10 reps Heel Slides: AROM, AAROM, Right, 10 reps Straight Leg Raises: AROM, Strengthening, Right    General Comments        Pertinent Vitals/Pain Pain Assessment Pain Assessment: Faces Faces Pain Scale: Hurts even more Pain Location: R knee Pain Descriptors / Indicators: Aching, Sore, Constant Pain Intervention(s): Limited activity within patient's tolerance, Monitored during session, Premedicated before session, Repositioned, Ice applied    Home Living                          Prior Function            PT Goals (current goals can now be found in the care plan section) Acute Rehab PT Goals PT Goal Formulation: With patient Time For Goal Achievement: 06/10/23 Potential to Achieve Goals:  Good Progress towards PT goals: Progressing toward goals    Frequency    7X/week      PT Plan      Co-evaluation              AM-PAC PT "6 Clicks" Mobility   Outcome Measure  Help needed turning from your back to your side while in a flat bed without using bedrails?: A Little Help needed moving from lying on your back to sitting on the side of a flat bed without using bedrails?: A Little Help needed moving to and from a bed to a chair (including a wheelchair)?: A Little Help needed standing up from a chair using your arms (e.g., wheelchair or bedside chair)?: A Little Help needed to walk in hospital room?: A Little Help needed climbing 3-5 steps with a railing?  : A Little 6 Click Score: 18    End of Session Equipment Utilized During Treatment: Gait belt Activity Tolerance: Patient tolerated treatment well Patient left: in chair;with call bell/phone within reach;with chair alarm set;with nursing/sitter in room   PT Visit Diagnosis: Other abnormalities of gait and mobility (R26.89);History of falling (Z91.81)     Time: 9562-1308 PT Time Calculation (min) (ACUTE ONLY): 19 min  Charges:    $Gait Training: 8-22 mins PT General Charges $$ ACUTE PT VISIT: 1 Visit                     Tandre Conly, PT  Acute Rehab Dept [facility]) [phone]  06/03/2023    [facility] 06/03/2023, 3:29 PM

## 2023-06-04 DIAGNOSIS — M1711 Unilateral primary osteoarthritis, right knee: Secondary | ICD-10-CM | POA: Diagnosis present

## 2023-06-04 DIAGNOSIS — Z886 Allergy status to analgesic agent status: Secondary | ICD-10-CM | POA: Diagnosis not present

## 2023-06-04 DIAGNOSIS — Z8249 Family history of ischemic heart disease and other diseases of the circulatory system: Secondary | ICD-10-CM | POA: Diagnosis not present

## 2023-06-04 DIAGNOSIS — Z885 Allergy status to narcotic agent status: Secondary | ICD-10-CM | POA: Diagnosis not present

## 2023-06-04 DIAGNOSIS — Z888 Allergy status to other drugs, medicaments and biological substances status: Secondary | ICD-10-CM | POA: Diagnosis not present

## 2023-06-04 DIAGNOSIS — F32A Depression, unspecified: Secondary | ICD-10-CM | POA: Diagnosis present

## 2023-06-04 DIAGNOSIS — R110 Nausea: Secondary | ICD-10-CM | POA: Diagnosis not present

## 2023-06-04 DIAGNOSIS — G894 Chronic pain syndrome: Secondary | ICD-10-CM | POA: Diagnosis present

## 2023-06-04 DIAGNOSIS — Z880 Allergy status to penicillin: Secondary | ICD-10-CM | POA: Diagnosis not present

## 2023-06-04 DIAGNOSIS — Z79899 Other long term (current) drug therapy: Secondary | ICD-10-CM | POA: Diagnosis not present

## 2023-06-04 DIAGNOSIS — I10 Essential (primary) hypertension: Secondary | ICD-10-CM | POA: Diagnosis present

## 2023-06-04 DIAGNOSIS — Z7982 Long term (current) use of aspirin: Secondary | ICD-10-CM | POA: Diagnosis not present

## 2023-06-04 DIAGNOSIS — Z8489 Family history of other specified conditions: Secondary | ICD-10-CM | POA: Diagnosis not present

## 2023-06-04 DIAGNOSIS — R7303 Prediabetes: Secondary | ICD-10-CM | POA: Diagnosis present

## 2023-06-04 DIAGNOSIS — E782 Mixed hyperlipidemia: Secondary | ICD-10-CM | POA: Diagnosis present

## 2023-06-04 DIAGNOSIS — Z833 Family history of diabetes mellitus: Secondary | ICD-10-CM | POA: Diagnosis not present

## 2023-06-04 DIAGNOSIS — Z96651 Presence of right artificial knee joint: Secondary | ICD-10-CM

## 2023-06-04 MED ORDER — ASPIRIN 81 MG PO CHEW: 81.0000 mg | CHEWABLE_TABLET | Freq: Two times a day (BID) | ORAL | 0 refills | Status: AC

## 2023-06-04 MED ORDER — BISACODYL 10 MG RE SUPP
10.0000 mg | Freq: Once | RECTAL | Status: AC
  Administered 2023-06-04: 10 mg via RECTAL
  Filled 2023-06-04: qty 1

## 2023-06-04 MED ORDER — HYDROCODONE-ACETAMINOPHEN 5-325 MG PO TABS
1.0000 | ORAL_TABLET | ORAL | 0 refills | Status: DC | PRN
Start: 2023-06-04 — End: 2023-06-08

## 2023-06-04 NOTE — Progress Notes (Signed)
 Patient ambulated to the bathroom.

## 2023-06-04 NOTE — Progress Notes (Signed)
 Physical Therapy Treatment Patient Details Name: Kaylee Warner MRN: [id number] DOB: 27-Sep-1945 Today's Date: 06/04/2023   History of Present Illness 78 yo female s/p RTKA on 06/02/23. PMH: cervicalgia, R RD tear, spinal stenosis, DDD, depression, DVT,    PT Comments  Pt is progress however wanted to wait to take pain meds until after PT session. Amb ~ 25' with CGA however activity then limited by pain. Pt remains motivated to d/c home today, will see again in pm.    If plan is discharge home, recommend the following: A little help with walking and/or transfers;A little help with bathing/dressing/bathroom;Help with stairs or ramp for entrance;Assist for transportation;Assistance with cooking/housework   Can travel by private vehicle        Equipment Recommendations  None recommended by PT    Recommendations for Other Services       Precautions / Restrictions Precautions Precautions: Fall;Knee Precaution Booklet Issued: No Recall of Precautions/Restrictions: Intact Required Braces or Orthoses: Knee Immobilizer - Right Restrictions Other Position/Activity Restrictions: WBAT     Mobility  Bed Mobility Overal bed mobility: Needs Assistance Bed Mobility: Supine to Sit     Supine to sit: Mod assist     General bed mobility comments: multi-modal cues and incr time needed to self assist    Transfers Overall transfer level: Needs assistance Equipment used: Rolling walker (2 wheels) Transfers: Sit to/from Stand Sit to Stand: Min assist, From elevated surface, +2 safety/equipment           General transfer comment: cues for RLE position and hand placement    Ambulation/Gait Ambulation/Gait assistance: Min assist, Contact guard assist Gait Distance (Feet): 25 Feet Assistive device: Rolling walker (2 wheels) Gait Pattern/deviations: Step-to pattern, Decreased stance time - right       General Gait Details: cues for sequence and RW position; pt with incr pain  (declined pain meds prior to PT) and required seated rest after above distance. pt reported feeling "hot", denied dizziness or nausea   Stairs             Wheelchair Mobility     Tilt Bed    Modified Rankin (Stroke Patients Only)       Balance                                            Communication Communication Communication: No apparent difficulties  Cognition Arousal: Alert Behavior During Therapy: WFL for tasks assessed/performed   PT - Cognitive impairments: No apparent impairments                         Following commands: Intact      Cueing Cueing Techniques: Verbal cues  Exercises Total Joint Exercises Ankle Circles/Pumps: AROM, Both, 10 reps    General Comments        Pertinent Vitals/Pain Pain Assessment Pain Assessment: Faces Faces Pain Scale: Hurts whole lot Pain Location: R knee Pain Descriptors / Indicators: Aching, Sore, Constant Pain Intervention(s): Limited activity within patient's tolerance, Monitored during session, Premedicated before session, Repositioned, RN gave pain meds during session (declined ice)    Home Living                          Prior Function            PT Goals (current  goals can now be found in the care plan section) Acute Rehab PT Goals PT Goal Formulation: With patient Time For Goal Achievement: 06/10/23 Potential to Achieve Goals: Good Progress towards PT goals: Progressing toward goals    Frequency    7X/week      PT Plan      Co-evaluation              AM-PAC PT "6 Clicks" Mobility   Outcome Measure  Help needed turning from your back to your side while in a flat bed without using bedrails?: A Little Help needed moving from lying on your back to sitting on the side of a flat bed without using bedrails?: A Little Help needed moving to and from a bed to a chair (including a wheelchair)?: A Little Help needed standing up from a chair using your  arms (e.g., wheelchair or bedside chair)?: A Little Help needed to walk in hospital room?: A Little Help needed climbing 3-5 steps with a railing? : A Little 6 Click Score: 18    End of Session Equipment Utilized During Treatment: Gait belt Activity Tolerance: Patient tolerated treatment well Patient left: in chair;with call bell/phone within reach;with chair alarm set;with nursing/sitter in room;with family/visitor present   PT Visit Diagnosis: Other abnormalities of gait and mobility (R26.89);History of falling (Z91.81)     Time: 1040-1101 PT Time Calculation (min) (ACUTE ONLY): 21 min  Charges:    $Gait Training: 8-22 mins PT General Charges $$ ACUTE PT VISIT: 1 Visit                     Yzabelle Calles, PT  Acute Rehab Dept ([facility]) [phone]  06/04/2023    [facility] 06/04/2023, 11:09 AM

## 2023-06-04 NOTE — Progress Notes (Addendum)
 Subjective: 2 Days Post-Op Procedure(s) (LRB): ARTHROPLASTY, KNEE, TOTAL (Right) Patient reports pain as soreness no pain this AM.  Wanting to have a BM. No complaints otherwise. Would like to discharge to home later today.   Objective: Vital signs in last 24 hours: Temp:  [97.8 F (36.6 C)-99.2 F (37.3 C)] 99 F (37.2 C) (05/25 0520) Pulse Rate:  [70-75] 73 (05/25 0520) Resp:  [18] 18 (05/25 0520) BP: (142-165)/(68-80) 142/69 (05/25 0520) SpO2:  [90 %-96 %] 94 % (05/25 0520)  Intake/Output from previous day: 05/24 0701 - 05/25 0700 In: 486.3 [P.O.:360; I.V.:126.3] Out: 350 [Urine:350] Intake/Output this shift: No intake/output data recorded.  Recent Labs    06/03/23 0340  HGB 10.8*   Recent Labs    06/03/23 0340  WBC 13.1*  RBC 4.49  HCT 34.9*  PLT 279   Recent Labs    06/03/23 0340  NA 137  K 3.5  CL 107  CO2 23  BUN 14  CREATININE 0.57  GLUCOSE 132*  CALCIUM  8.6*   No results for input(s): "LABPT", "INR" in the last 72 hours.  Intact pulses distally Dorsiflexion/Plantar flexion intact Incision: dressing C/D/I Compartment soft   Assessment/Plan: 2 Days Post-Op Procedure(s) (LRB): ARTHROPLASTY, KNEE, TOTAL (Right) Up with therapy Plan to discharge to home later today in afternoon if she remains stable  and does well with PT.  Dulcolax for BM today     Lugene Hitt 06/04/2023, 8:44 AM

## 2023-06-04 NOTE — Plan of Care (Signed)
   Problem: Coping: Goal: Level of anxiety will decrease Outcome: Progressing   Problem: Pain Managment: Goal: General experience of comfort will improve and/or be controlled Outcome: Progressing   Problem: Safety: Goal: Ability to remain free from injury will improve Outcome: Progressing

## 2023-06-04 NOTE — Discharge Summary (Signed)
 Patient ID: Kaylee Warner MRN: [id number] DOB/AGE: 1945-09-18 78 y.o.  Admit date: 06/02/2023 Discharge date: 06/04/2023  Admission Diagnoses:  Principal Problem:   Unilateral primary osteoarthritis, right knee Active Problems:   Status post total right knee replacement   Discharge Diagnoses:  Same  Past Medical History:  Diagnosis Date   Allergy    Anemia    Arthritis    Hypertension     Surgeries: Procedure(s): ARTHROPLASTY, KNEE, TOTAL on 06/02/2023   Consultants:   Discharged Condition: Improved  Hospital Course: Kaylee HAUSMANN is an 78 y.o. female who was admitted 06/02/2023 for operative treatment ofUnilateral primary osteoarthritis, right knee. Patient has severe unremitting pain that affects sleep, daily activities, and work/hobbies. After pre-op clearance the patient was taken to the operating room on 06/02/2023 and underwent  Procedure(s): ARTHROPLASTY, KNEE, TOTAL.    Patient was given perioperative antibiotics:  Anti-infectives (From admission, onward)    Start     Dose/Rate Route Frequency Ordered Stop   06/02/23 1900  ceFAZolin (ANCEF) IVPB 2g/100 mL premix        2 g 200 mL/hr over 30 Minutes Intravenous Every 6 hours 06/02/23 1803 06/03/23 0136   06/02/23 1015  ceFAZolin (ANCEF) IVPB 2g/100 mL premix        2 g 200 mL/hr over 30 Minutes Intravenous On call to O.R. 06/02/23 1007 06/02/23 1252        Patient was given sequential compression devices, early ambulation, and chemoprophylaxis to prevent DVT.  Patient benefited maximally from hospital stay and there were no complications.    Recent vital signs: Patient Vitals for the past 24 hrs:  BP Temp Temp src Pulse Resp SpO2  06/04/23 0520 (!) 142/69 99 F (37.2 C) -- 73 18 94 %  06/03/23 2130 (!) 151/71 99.2 F (37.3 C) Oral 75 18 90 %  06/03/23 1335 (!) 146/68 98.3 F (36.8 C) Oral 74 18 95 %  06/03/23 1009 (!) 165/80 97.8 F (36.6 C) Oral 70 18 96 %     Recent laboratory studies:  Recent  Labs    06/03/23 0340  WBC 13.1*  HGB 10.8*  HCT 34.9*  PLT 279  NA 137  K 3.5  CL 107  CO2 23  BUN 14  CREATININE 0.57  GLUCOSE 132*  CALCIUM  8.6*     Discharge Medications:   Allergies as of 06/04/2023       Reactions   Celecoxib    blood pressure went up   Codeine Itching   Crestor  [rosuvastatin  Calcium ]    Muscle cramping and achy, tolerates 5 mg 3 time weekly    Penicillins Rash        Medication List     STOP taking these medications    aspirin  EC 81 MG tablet Replaced by: aspirin  81 MG chewable tablet   GOODY HEADACHE PO   ibuprofen  800 MG tablet Commonly known as: ADVIL        TAKE these medications    amLODipine  10 MG tablet Commonly known as: NORVASC  Take 1 tablet (10 mg total) by mouth daily.   aspirin  81 MG chewable tablet Chew 1 tablet (81 mg total) by mouth 2 (two) times daily. Replaces: aspirin  EC 81 MG tablet   cetirizine  10 MG tablet Commonly known as: ZYRTEC  Take 1 tablet by mouth once daily   cholecalciferol 25 MCG (1000 UNIT) tablet Commonly known as: VITAMIN D3 Take 1,000 Units by mouth daily.   cyanocobalamin 1000 MCG tablet Commonly known as: VITAMIN  B12 Take 1,000 mcg by mouth once a week.   diclofenac  Sodium 1 % Gel Commonly known as: VOLTAREN  Apply 1 Application topically 4 (four) times daily as needed (pain).   donepezil  10 MG tablet Commonly known as: ARICEPT  Take 1 tablet (10 mg total) by mouth at bedtime.   fluticasone  50 MCG/ACT nasal spray Commonly known as: FLONASE  Use 2 spray(s) in each nostril once daily What changed: See the new instructions.   HYDROcodone -acetaminophen  5-325 MG tablet Commonly known as: NORCO/VICODIN Take 1-2 tablets by mouth every 4 (four) hours as needed for severe pain (pain score 7-10).   lisinopril  40 MG tablet Commonly known as: ZESTRIL  Take 1 tablet (40 mg total) by mouth daily.   Magnesium 400 MG Tabs Take 400 mg by mouth at bedtime.   pregabalin  25 MG  capsule Commonly known as: Lyrica  Take 1 capsule (25 mg total) by mouth 2 (two) times daily.   rosuvastatin  5 MG tablet Commonly known as: Crestor  Take on three times per week on Monday,Wednesday and Friday   sertraline  25 MG tablet Commonly known as: ZOLOFT  Take 2 tablets by mouth once daily   tiZANidine  4 MG tablet Commonly known as: ZANAFLEX  Take 1 tablet (4 mg total) by mouth 2 (two) times daily as needed. What changed:  when to take this reasons to take this               Durable Medical Equipment  (From admission, onward)           Start     Ordered   06/02/23 1803  DME 3 n 1  Once        06/02/23 1803   06/02/23 1803  DME Walker rolling  Once       Question Answer Comment  Walker: With 5 Inch Wheels   Patient needs a walker to treat with the following condition Status post total right knee replacement      06/02/23 1803            Diagnostic Studies: DG Knee Right Port Result Date: 06/02/2023 CLINICAL DATA:  Status post total right knee arthroplasty. EXAM: PORTABLE RIGHT KNEE - 1-2 VIEW COMPARISON:  Right knee radiographs 05/03/2023 FINDINGS: Interval total right knee arthroplasty. No perihardware lucency is seen to indicate hardware failure or loosening. Expected postoperative changes including intra-articular and subcutaneous air. Smalljoint effusion. Anterior surgical skin staples. No acute fracture or dislocation. IMPRESSION: Interval total right knee arthroplasty without evidence of hardware failure. Electronically Signed   By: Bertina Broccoli M.D.   On: 06/02/2023 18:57    Disposition: Discharge disposition: 06-Home-Health Care Svc          Follow-up Information     Arnie Lao, MD Follow up in 2 week(s).   Specialty: Orthopedic Surgery Contact information: [address] [place] [address] [phone]         [facility] [place]  Follow up.   Why: This provider will contact you to schedule  appointments at your home. Contact information: [address] [place] [address] [phone]                  Signed: Gomez Lathe 06/04/2023, 8:59 AM

## 2023-06-04 NOTE — Plan of Care (Signed)
  Problem: Clinical Measurements: Goal: Diagnostic test results will improve Outcome: Progressing Goal: Respiratory complications will improve Outcome: Progressing Goal: Cardiovascular complication will be avoided Outcome: Progressing   Problem: Activity: Goal: Risk for activity intolerance will decrease Outcome: Progressing

## 2023-06-04 NOTE — Progress Notes (Signed)
 Physical Therapy Treatment Patient Details Name: Kaylee Warner MRN: [id number] DOB: 1945-08-20 Today's Date: 06/04/2023   History of Present Illness 78 yo female s/p RTKA on 06/02/23. PMH: cervicalgia, R RD tear, spinal stenosis, DDD, depression, DVT,    PT Comments  Pt remains very motivated and willing to work with PT, however still having some issues with pain and requiring extensive assist with STS transfers as well;  family in room reports pt spouse cannot safely provide mod assist for transfers. Pt may need another day to work on pain control and mobility for safe d/c home   If plan is discharge home, recommend the following: A little help with walking and/or transfers;A little help with bathing/dressing/bathroom;Help with stairs or ramp for entrance;Assist for transportation;Assistance with cooking/housework   Can travel by private vehicle        Equipment Recommendations  None recommended by PT    Recommendations for Other Services       Precautions / Restrictions Precautions Precautions: Fall;Knee Precaution Booklet Issued: No Recall of Precautions/Restrictions: Intact Required Braces or Orthoses: Knee Immobilizer - Right Restrictions Other Position/Activity Restrictions: WBAT     Mobility  Bed Mobility Overal bed mobility: Needs Assistance Bed Mobility: Supine to Sit     Supine to sit: Mod assist     General bed mobility comments: in recliner and returned to same    Transfers Overall transfer level: Needs assistance Equipment used: Rolling walker (2 wheels) Transfers: Sit to/from Stand Sit to Stand: Mod assist           General transfer comment: cues for RLE position, control of descent  and hand placement    Ambulation/Gait Ambulation/Gait assistance: Supervision, Contact guard assist, Min assist Gait Distance (Feet): 25 Feet Assistive device: Rolling walker (2 wheels) Gait Pattern/deviations: Step-to pattern, Decreased stance time - right        General Gait Details: progressed min assist to supervision with incr distance. cues for sequence, trunk extension and RW position;   Stairs             Wheelchair Mobility     Tilt Bed    Modified Rankin (Stroke Patients Only)       Balance                                            Communication Communication Communication: No apparent difficulties  Cognition Arousal: Alert Behavior During Therapy: WFL for tasks assessed/performed   PT - Cognitive impairments: No apparent impairments                         Following commands: Intact      Cueing Cueing Techniques: Verbal cues  Exercises Total Joint Exercises Ankle Circles/Pumps: AROM, Both, 10 reps Quad Sets: AROM, Both, 10 reps Heel Slides: AROM, AAROM, Right, 10 reps Straight Leg Raises: AROM, Strengthening, Right    General Comments        Pertinent Vitals/Pain Pain Assessment Pain Assessment: Faces Faces Pain Scale: Hurts even more Pain Location: R knee Pain Descriptors / Indicators: Aching, Sore, Constant Pain Intervention(s): Limited activity within patient's tolerance, Monitored during session, Premedicated before session, Repositioned, Ice applied    Home Living                          Prior Function  PT Goals (current goals can now be found in the care plan section) Acute Rehab PT Goals PT Goal Formulation: With patient Time For Goal Achievement: 06/10/23 Potential to Achieve Goals: Good Progress towards PT goals: Progressing toward goals    Frequency    7X/week      PT Plan      Co-evaluation              AM-PAC PT "6 Clicks" Mobility   Outcome Measure  Help needed turning from your back to your side while in a flat bed without using bedrails?: A Little Help needed moving from lying on your back to sitting on the side of a flat bed without using bedrails?: A Little Help needed moving to and from a bed to a  chair (including a wheelchair)?: A Little Help needed standing up from a chair using your arms (e.g., wheelchair or bedside chair)?: A Little Help needed to walk in hospital room?: A Little Help needed climbing 3-5 steps with a railing? : A Little 6 Click Score: 18    End of Session Equipment Utilized During Treatment: Gait belt Activity Tolerance: Patient tolerated treatment well;Patient limited by pain Patient left: in chair;with call bell/phone within reach;with chair alarm set;with family/visitor present   PT Visit Diagnosis: Other abnormalities of gait and mobility (R26.89);History of falling (Z91.81)     Time: 1914-7829 PT Time Calculation (min) (ACUTE ONLY): 36 min  Charges:    $Gait Training: 23-37 mins PT General Charges $$ ACUTE PT VISIT: 1 Visit                     Tauna Macfarlane, PT  Acute Rehab Dept [facility]) [phone]  06/04/2023    [facility] 06/04/2023, 2:47 PM

## 2023-06-05 NOTE — Plan of Care (Signed)
 Patient discharged home via private vehicle with friend Barbra Ley. AVS and discharge instruction provided. Patient verbalizes understanding. Ara Knee, RN 06/05/23 11:56 AM

## 2023-06-05 NOTE — Care Management Important Message (Signed)
 Important Message  Patient Details  Name: Kaylee Warner MRN: [id number] Date of Birth: 11-Dec-1945   Important Message Given:  Yes - Medicare IM (letter reviewed at [phone] with daughter in law Alfredo Inch per Ms. Gonzaga due to having therapy at the time of call)     Allice Garro L Nathaneil Feagans 06/05/2023, 10:00 AM

## 2023-06-05 NOTE — Discharge Summary (Signed)
 Patient ID: Kaylee Warner MRN: [id number] DOB/AGE: 1945-12-12 78 y.o.  Admit date: 06/02/2023 Discharge date: 06/05/2023  Admission Diagnoses:  Principal Problem:   Unilateral primary osteoarthritis, right knee Active Problems:   Status post total right knee replacement   Status post total knee replacement, right   Discharge Diagnoses:  Same  Past Medical History:  Diagnosis Date   Allergy    Anemia    Arthritis    Hypertension     Surgeries: Procedure(s): ARTHROPLASTY, KNEE, TOTAL on 06/02/2023   Consultants:   Discharged Condition: Improved  Hospital Course: GIAVANA ROOKE is an 78 y.o. female who was admitted 06/02/2023 for operative treatment ofUnilateral primary osteoarthritis, right knee. Patient has severe unremitting pain that affects sleep, daily activities, and work/hobbies. After pre-op clearance the patient was taken to the operating room on 06/02/2023 and underwent  Procedure(s): ARTHROPLASTY, KNEE, TOTAL.    Patient was given perioperative antibiotics:  Anti-infectives (From admission, onward)    Start     Dose/Rate Route Frequency Ordered Stop   06/02/23 1900  ceFAZolin  (ANCEF ) IVPB 2g/100 mL premix        2 g 200 mL/hr over 30 Minutes Intravenous Every 6 hours 06/02/23 1803 06/03/23 0136   06/02/23 1015  ceFAZolin  (ANCEF ) IVPB 2g/100 mL premix        2 g 200 mL/hr over 30 Minutes Intravenous On call to O.R. 06/02/23 1007 06/02/23 1252        Patient was given sequential compression devices, early ambulation, and chemoprophylaxis to prevent DVT.  Patient benefited maximally from hospital stay and there were no complications.    Recent vital signs: Patient Vitals for the past 24 hrs:  BP Temp Temp src Pulse Resp SpO2  06/05/23 0556 (!) 122/90 98.4 F (36.9 C) Oral 80 18 94 %  06/04/23 2106 (!) 147/56 98.7 F (37.1 C) Oral 84 18 91 %  06/04/23 1810 (!) 141/70 97.9 F (36.6 C) -- 77 18 99 %  06/04/23 1526 134/61 97.8 F (36.6 C) -- 72 16 100 %   06/04/23 1030 (!) 143/66 98.2 F (36.8 C) -- 79 14 90 %     Recent laboratory studies:  Recent Labs    06/03/23 0340  WBC 13.1*  HGB 10.8*  HCT 34.9*  PLT 279  NA 137  K 3.5  CL 107  CO2 23  BUN 14  CREATININE 0.57  GLUCOSE 132*  CALCIUM  8.6*     Discharge Medications:   Allergies as of 06/05/2023       Reactions   Celecoxib    blood pressure went up   Codeine Itching   Crestor  [rosuvastatin  Calcium ]    Muscle cramping and achy, tolerates 5 mg 3 time weekly    Penicillins Rash        Medication List     STOP taking these medications    aspirin  EC 81 MG tablet Replaced by: aspirin  81 MG chewable tablet   GOODY HEADACHE PO   ibuprofen  800 MG tablet Commonly known as: ADVIL        TAKE these medications    amLODipine  10 MG tablet Commonly known as: NORVASC  Take 1 tablet (10 mg total) by mouth daily.   aspirin  81 MG chewable tablet Chew 1 tablet (81 mg total) by mouth 2 (two) times daily. Replaces: aspirin  EC 81 MG tablet   cetirizine  10 MG tablet Commonly known as: ZYRTEC  Take 1 tablet by mouth once daily   cholecalciferol  25 MCG (1000 UNIT) tablet  Commonly known as: VITAMIN D3 Take 1,000 Units by mouth daily.   cyanocobalamin 1000 MCG tablet Commonly known as: VITAMIN B12 Take 1,000 mcg by mouth once a week.   diclofenac  Sodium 1 % Gel Commonly known as: VOLTAREN  Apply 1 Application topically 4 (four) times daily as needed (pain).   donepezil  10 MG tablet Commonly known as: ARICEPT  Take 1 tablet (10 mg total) by mouth at bedtime.   fluticasone  50 MCG/ACT nasal spray Commonly known as: FLONASE  Use 2 spray(s) in each nostril once daily What changed: See the new instructions.   HYDROcodone -acetaminophen  5-325 MG tablet Commonly known as: NORCO/VICODIN Take 1-2 tablets by mouth every 4 (four) hours as needed for severe pain (pain score 7-10).   lisinopril  40 MG tablet Commonly known as: ZESTRIL  Take 1 tablet (40 mg total) by  mouth daily.   Magnesium 400 MG Tabs Take 400 mg by mouth at bedtime.   pregabalin  25 MG capsule Commonly known as: Lyrica  Take 1 capsule (25 mg total) by mouth 2 (two) times daily.   rosuvastatin  5 MG tablet Commonly known as: Crestor  Take on three times per week on Monday,Wednesday and Friday   sertraline  25 MG tablet Commonly known as: ZOLOFT  Take 2 tablets by mouth once daily   tiZANidine  4 MG tablet Commonly known as: ZANAFLEX  Take 1 tablet (4 mg total) by mouth 2 (two) times daily as needed. What changed:  when to take this reasons to take this               Durable Medical Equipment  (From admission, onward)           Start     Ordered   06/02/23 1803  DME 3 n 1  Once        06/02/23 1803   06/02/23 1803  DME Walker rolling  Once       Question Answer Comment  Walker: With 5 Inch Wheels   Patient needs a walker to treat with the following condition Status post total right knee replacement      06/02/23 1803            Diagnostic Studies: DG Knee Right Port Result Date: 06/02/2023 CLINICAL DATA:  Status post total right knee arthroplasty. EXAM: PORTABLE RIGHT KNEE - 1-2 VIEW COMPARISON:  Right knee radiographs 05/03/2023 FINDINGS: Interval total right knee arthroplasty. No perihardware lucency is seen to indicate hardware failure or loosening. Expected postoperative changes including intra-articular and subcutaneous air. Smalljoint effusion. Anterior surgical skin staples. No acute fracture or dislocation. IMPRESSION: Interval total right knee arthroplasty without evidence of hardware failure. Electronically Signed   By: Bertina Broccoli M.D.   On: 06/02/2023 18:57    Disposition: Discharge disposition: 01-Home or Self Care          Follow-up Information     Arnie Lao, MD Follow up in 2 week(s).   Specialty: Orthopedic Surgery Contact information: [address] [place]  [place] [address] [phone]         [facility] [place]  Follow up.   Why: This provider will contact you to schedule appointments at your home. Contact information: [address] [place] [address] [phone]                  Signed: Arnie Lao 06/05/2023, 9:33 AM

## 2023-06-05 NOTE — TOC Transition Note (Signed)
 Transition of Care [facility]) - Discharge Note   Patient Details  Name: Kaylee Warner MRN: [id number] Date of Birth: 1945-08-01  [facility]) CM/SW Contact:  Delilah Fend, LCSW Phone Number: 06/05/2023, 9:49 AM   Clinical Narrative:     Met with pt who confirms she has needed DME in the home.  Pt aware HHPT prearranged with [facility] via ortho MD office prior to surgery.  No further TOC needs.  Final next level of care: Home w Home Health Services Barriers to Discharge: No Barriers Identified   Patient Goals and CMS Choice Patient states their goals for this hospitalization and ongoing recovery are:: return home          Discharge Placement                       Discharge Plan and Services Additional resources added to the After Visit Summary for                  DME Arranged: N/A DME Agency: NA       HH Arranged: PT HH Agency: [facility]        Social Drivers of Health (SDOH) Interventions SDOH Screenings   Food Insecurity: No Food Insecurity (06/02/2023)  Housing: Low Risk  (06/02/2023)  Transportation Needs: No Transportation Needs (06/02/2023)  Utilities: Not At Risk (06/02/2023)  Depression (PHQ2-9): Medium Risk (05/18/2023)  Financial Resource Strain: Low Risk  (12/15/2016)  Physical Activity: Insufficiently Active (12/15/2016)  Social Connections: Socially Integrated (06/02/2023)  Stress: Stress Concern Present (12/15/2016)  Tobacco Use: Low Risk  (06/02/2023)     Readmission Risk Interventions    06/05/2023    9:49 AM  Readmission Risk Prevention Plan  Post Dischage Appt Complete  Medication Screening Complete  Transportation Screening Complete

## 2023-06-05 NOTE — Progress Notes (Signed)
 Patient ID: Kaylee Warner, female   DOB: 12/03/1945, 78 y.o.   MRN: [id number] The patient looks good overall today.  She ended up needing to stay a day just secondary to weakness and deconditioning.  PT is working with her and her mobility.  The goal is to discharge her to home today.  Her vital signs are stable and her right operative knee is stable.  She feels confident that she can get out of here today.

## 2023-06-05 NOTE — Progress Notes (Signed)
 Physical Therapy Treatment Patient Details Name: Kaylee Warner MRN: [id number] DOB: 04/14/1945 Today's Date: 06/05/2023   History of Present Illness 78 yo female s/p RTKA on 06/02/23. PMH: cervicalgia, R RD tear, spinal stenosis, DDD, depression, DVT,    PT Comments  Pt making good progress today, meeting PT goals. . Pt and dtr-in-law has been working with pt on TKA exercises between therapy sessions, handout provided. Pt knee flexion ~ 85 degrees in sitting. Pt is ready to d/c from PT standpoint with family assist prn   If plan is discharge home, recommend the following: A little help with walking and/or transfers;A little help with bathing/dressing/bathroom;Help with stairs or ramp for entrance;Assist for transportation;Assistance with cooking/housework   Can travel by private vehicle        Equipment Recommendations  None recommended by PT    Recommendations for Other Services       Precautions / Restrictions Precautions Precautions: Fall;Knee Precaution Booklet Issued: No Recall of Precautions/Restrictions: Intact Required Braces or Orthoses: Knee Immobilizer - Right Restrictions Other Position/Activity Restrictions: WBAT     Mobility  Bed Mobility               General bed mobility comments: in recliner and returned to same    Transfers Overall transfer level: Needs assistance Equipment used: Rolling walker (2 wheels) Transfers: Sit to/from Stand Sit to Stand: Contact guard assist, Min assist           General transfer comment: STS from bed, recliner, BSC.cues for RLE position, control of descent  and hand placement; min assist from lower ht surfaces    Ambulation/Gait Ambulation/Gait assistance: Supervision, Contact guard assist Gait Distance (Feet): 80 Feet (10' more) Assistive device: Rolling walker (2 wheels) Gait Pattern/deviations: Step-to pattern, Decreased stance time - right       General Gait Details: supervision for safety  cues for  sequence, trunk extension and RW position;   Stairs Stairs:  (today pt reports she has no steps, family confirms)           Wheelchair Mobility     Tilt Bed    Modified Rankin (Stroke Patients Only)       Balance                                            Communication Communication Communication: No apparent difficulties  Cognition Arousal: Alert Behavior During Therapy: WFL for tasks assessed/performed   PT - Cognitive impairments: No apparent impairments                         Following commands: Intact      Cueing Cueing Techniques: Verbal cues  Exercises Total Joint Exercises Ankle Circles/Pumps: AROM, Both, 10 reps    General Comments        Pertinent Vitals/Pain Pain Assessment Faces Pain Scale: Hurts even more Pain Location: R knee Pain Descriptors / Indicators: Aching, Sore, Constant    Home Living                          Prior Function            PT Goals (current goals can now be found in the care plan section) Acute Rehab PT Goals PT Goal Formulation: With patient Time For Goal Achievement: 06/10/23 Potential to Achieve Goals:  Good Progress towards PT goals: Progressing toward goals    Frequency    7X/week      PT Plan      Co-evaluation              AM-PAC PT "6 Clicks" Mobility   Outcome Measure  Help needed turning from your back to your side while in a flat bed without using bedrails?: A Little Help needed moving from lying on your back to sitting on the side of a flat bed without using bedrails?: A Little Help needed moving to and from a bed to a chair (including a wheelchair)?: A Little Help needed standing up from a chair using your arms (e.g., wheelchair or bedside chair)?: A Little Help needed to walk in hospital room?: A Little Help needed climbing 3-5 steps with a railing? : A Little 6 Click Score: 18    End of Session Equipment Utilized During Treatment: Gait  belt Activity Tolerance: Patient tolerated treatment well;Patient limited by pain Patient left: in chair;with call bell/phone within reach;with chair alarm set;with family/visitor present   PT Visit Diagnosis: Other abnormalities of gait and mobility (R26.89);History of falling (Z91.81)     Time: 0950-1030 PT Time Calculation (min) (ACUTE ONLY): 40 min  Charges:    $Gait Training: 23-37 mins $Therapeutic Activity: 8-22 mins PT General Charges $$ ACUTE PT VISIT: 1 Visit                     Corbin Hott, PT  Acute Rehab Dept [facility]) [phone]  06/05/2023    [facility] 06/05/2023, 10:36 AM

## 2023-06-06 ENCOUNTER — Encounter (HOSPITAL_COMMUNITY): Payer: Self-pay | Admitting: Orthopaedic Surgery

## 2023-06-06 DIAGNOSIS — R600 Localized edema: Secondary | ICD-10-CM | POA: Diagnosis not present

## 2023-06-06 DIAGNOSIS — E785 Hyperlipidemia, unspecified: Secondary | ICD-10-CM | POA: Diagnosis not present

## 2023-06-06 DIAGNOSIS — Z79899 Other long term (current) drug therapy: Secondary | ICD-10-CM | POA: Diagnosis not present

## 2023-06-06 DIAGNOSIS — Z471 Aftercare following joint replacement surgery: Secondary | ICD-10-CM | POA: Diagnosis not present

## 2023-06-06 DIAGNOSIS — D649 Anemia, unspecified: Secondary | ICD-10-CM | POA: Diagnosis not present

## 2023-06-06 DIAGNOSIS — M48062 Spinal stenosis, lumbar region with neurogenic claudication: Secondary | ICD-10-CM | POA: Diagnosis not present

## 2023-06-06 DIAGNOSIS — I872 Venous insufficiency (chronic) (peripheral): Secondary | ICD-10-CM | POA: Diagnosis not present

## 2023-06-06 DIAGNOSIS — M51361 Other intervertebral disc degeneration, lumbar region with lower extremity pain only: Secondary | ICD-10-CM | POA: Diagnosis not present

## 2023-06-06 DIAGNOSIS — Z96651 Presence of right artificial knee joint: Secondary | ICD-10-CM | POA: Diagnosis not present

## 2023-06-06 DIAGNOSIS — I10 Essential (primary) hypertension: Secondary | ICD-10-CM | POA: Diagnosis not present

## 2023-06-06 DIAGNOSIS — G894 Chronic pain syndrome: Secondary | ICD-10-CM | POA: Diagnosis not present

## 2023-06-06 DIAGNOSIS — Z9181 History of falling: Secondary | ICD-10-CM | POA: Diagnosis not present

## 2023-06-06 DIAGNOSIS — Z7982 Long term (current) use of aspirin: Secondary | ICD-10-CM | POA: Diagnosis not present

## 2023-06-06 NOTE — Anesthesia Postprocedure Evaluation (Signed)
 Anesthesia Post Note  Patient: Kaylee Warner  Procedure(s) Performed: ARTHROPLASTY, KNEE, TOTAL (Right: Knee)     Patient location during evaluation: PACU Anesthesia Type: Spinal Level of consciousness: awake and alert Pain management: pain level controlled Vital Signs Assessment: post-procedure vital signs reviewed and stable Respiratory status: spontaneous breathing and respiratory function stable Cardiovascular status: blood pressure returned to baseline and stable Postop Assessment: spinal receding Anesthetic complications: no   No notable events documented.  Last Vitals:  Vitals:   06/04/23 2106 06/05/23 0556  BP: (!) 147/56 (!) 122/90  Pulse: 84 80  Resp: 18 18  Temp: 37.1 C 36.9 C  SpO2: 91% 94%    Last Pain:  Vitals:   06/05/23 1014  TempSrc:   PainSc: 3                  Melvenia Stabs

## 2023-06-08 ENCOUNTER — Encounter (HOSPITAL_COMMUNITY): Payer: Self-pay | Admitting: *Deleted

## 2023-06-08 ENCOUNTER — Encounter: Admitting: Orthopaedic Surgery

## 2023-06-08 ENCOUNTER — Other Ambulatory Visit: Payer: Self-pay

## 2023-06-08 ENCOUNTER — Telehealth: Payer: Self-pay | Admitting: *Deleted

## 2023-06-08 ENCOUNTER — Encounter: Payer: Self-pay | Admitting: Orthopaedic Surgery

## 2023-06-08 ENCOUNTER — Emergency Department (HOSPITAL_COMMUNITY)
Admission: EM | Admit: 2023-06-08 | Discharge: 2023-06-08 | Disposition: A | Discharge status: Home / Self Care | Attending: Emergency Medicine | Admitting: Emergency Medicine

## 2023-06-08 ENCOUNTER — Emergency Department (HOSPITAL_COMMUNITY)

## 2023-06-08 DIAGNOSIS — R6889 Other general symptoms and signs: Secondary | ICD-10-CM | POA: Diagnosis not present

## 2023-06-08 DIAGNOSIS — M7989 Other specified soft tissue disorders: Secondary | ICD-10-CM | POA: Insufficient documentation

## 2023-06-08 DIAGNOSIS — T50901A Poisoning by unspecified drugs, medicaments and biological substances, accidental (unintentional), initial encounter: Secondary | ICD-10-CM | POA: Insufficient documentation

## 2023-06-08 DIAGNOSIS — R55 Syncope and collapse: Secondary | ICD-10-CM | POA: Diagnosis not present

## 2023-06-08 DIAGNOSIS — Z79899 Other long term (current) drug therapy: Secondary | ICD-10-CM | POA: Insufficient documentation

## 2023-06-08 DIAGNOSIS — I10 Essential (primary) hypertension: Secondary | ICD-10-CM | POA: Insufficient documentation

## 2023-06-08 DIAGNOSIS — T50904A Poisoning by unspecified drugs, medicaments and biological substances, undetermined, initial encounter: Secondary | ICD-10-CM | POA: Diagnosis not present

## 2023-06-08 DIAGNOSIS — Z96651 Presence of right artificial knee joint: Secondary | ICD-10-CM | POA: Diagnosis not present

## 2023-06-08 DIAGNOSIS — Z743 Need for continuous supervision: Secondary | ICD-10-CM | POA: Diagnosis not present

## 2023-06-08 DIAGNOSIS — M25561 Pain in right knee: Secondary | ICD-10-CM | POA: Diagnosis not present

## 2023-06-08 DIAGNOSIS — X58XXXA Exposure to other specified factors, initial encounter: Secondary | ICD-10-CM | POA: Insufficient documentation

## 2023-06-08 DIAGNOSIS — Z7982 Long term (current) use of aspirin: Secondary | ICD-10-CM | POA: Diagnosis not present

## 2023-06-08 DIAGNOSIS — R404 Transient alteration of awareness: Secondary | ICD-10-CM | POA: Diagnosis not present

## 2023-06-08 LAB — COMPREHENSIVE METABOLIC PANEL WITH GFR
ALT: 16 U/L (ref 0–44)
AST: 24 U/L (ref 15–41)
Albumin: 3.1 g/dL — ABNORMAL LOW (ref 3.5–5.0)
Alkaline Phosphatase: 82 U/L (ref 38–126)
Anion gap: 14 (ref 5–15)
BUN: 10 mg/dL (ref 8–23)
CO2: 25 mmol/L (ref 22–32)
Calcium: 8.9 mg/dL (ref 8.9–10.3)
Chloride: 98 mmol/L (ref 98–111)
Creatinine, Ser: 0.65 mg/dL (ref 0.44–1.00)
GFR, Estimated: >60 mL/min (ref >60)
Glucose, Bld: 138 mg/dL — ABNORMAL HIGH (ref 70–99)
Potassium: 3.7 mmol/L (ref 3.5–5.1)
Sodium: 137 mmol/L (ref 135–145)
Total Bilirubin: 1.2 mg/dL (ref 0.0–1.2)
Total Protein: 7.2 g/dL (ref 6.5–8.1)

## 2023-06-08 LAB — CBC WITH DIFFERENTIAL/PLATELET
Abs Immature Granulocytes: 0.05 10*3/uL (ref 0.00–0.07)
Basophils Absolute: 0 10*3/uL (ref 0.0–0.1)
Basophils Relative: 0 %
Eosinophils Absolute: 0.2 10*3/uL (ref 0.0–0.5)
Eosinophils Relative: 2 %
HCT: 31.7 % — ABNORMAL LOW (ref 36.0–46.0)
Hemoglobin: 10.2 g/dL — ABNORMAL LOW (ref 12.0–15.0)
Immature Granulocytes: 1 %
Lymphocytes Relative: 9 %
Lymphs Abs: 0.9 10*3/uL (ref 0.7–4.0)
MCH: 24.7 pg — ABNORMAL LOW (ref 26.0–34.0)
MCHC: 32.2 g/dL (ref 30.0–36.0)
MCV: 76.8 fL — ABNORMAL LOW (ref 80.0–100.0)
Monocytes Absolute: 0.7 10*3/uL (ref 0.1–1.0)
Monocytes Relative: 7 %
Neutro Abs: 8.8 10*3/uL — ABNORMAL HIGH (ref 1.7–7.7)
Neutrophils Relative %: 81 %
Platelets: 356 10*3/uL (ref 150–400)
RBC: 4.13 MIL/uL (ref 3.87–5.11)
RDW: 14.9 % (ref 11.5–15.5)
WBC: 10.6 10*3/uL — ABNORMAL HIGH (ref 4.0–10.5)
nRBC: 0 % (ref 0.0–0.2)

## 2023-06-08 LAB — RAPID URINE DRUG SCREEN, HOSP PERFORMED
Amphetamines: NOT DETECTED
Barbiturates: NOT DETECTED
Benzodiazepines: POSITIVE — AB
Cocaine: NOT DETECTED
Opiates: POSITIVE — AB
Tetrahydrocannabinol: NOT DETECTED

## 2023-06-08 LAB — ACETAMINOPHEN LEVEL: Acetaminophen (Tylenol), Serum: 11 ug/mL (ref 10–30)

## 2023-06-08 LAB — ETHANOL: Alcohol, Ethyl (B): <15 mg/dL (ref <15)

## 2023-06-08 MED ORDER — NALOXONE HCL 2 MG/2ML IJ SOSY
1.0000 mg | PREFILLED_SYRINGE | Freq: Once | INTRAMUSCULAR | Status: AC
  Administered 2023-06-08: 1 mg via INTRAVENOUS
  Filled 2023-06-08: qty 2

## 2023-06-08 MED ORDER — NALOXONE HCL 4 MG/10ML IJ SOLN
1.0000 mg/h | INTRAVENOUS | Status: DC
  Filled 2023-06-08: qty 10

## 2023-06-08 MED ORDER — HYDROCODONE-ACETAMINOPHEN 5-325 MG PO TABS: 1.0000 | ORAL_TABLET | Freq: Four times a day (QID) | ORAL | 0 refills | Status: DC | PRN

## 2023-06-08 NOTE — Telephone Encounter (Signed)
 Received call from Lauren, who is assisting with care of patient. There is a lot going on since her discharge. She reports she is very weak, is nauseated and not eating much. Her urine was very concentrated over last several days and had a hard time voiding- they have increased her fluids, but urine has a strong odor still. Her pain is not manageable right now. Feels like the Norco isn't doing the trick. They are taking round the clock and she's almost out. They are taking the muscle relaxer- can they take more often? Lyrica  is helping with some of the prickly sensation she is reporting as well, but they wanted to know if they can increase this? They are reporting she is full time care and they are having to lift her to get out of bed. I've discussed mag citrate because she hasn't had a BM yet also. There is a lot going on and wanted to see what we can quickly help with to get her going in the right direction. I think nausea and managing the pain is biggest issue. Thank you.

## 2023-06-08 NOTE — ED Provider Notes (Signed)
 [place] EMERGENCY DEPARTMENT AT [facility] Provider Note   CSN: [id number] Arrival date & time: 06/08/23  1220     History  Chief Complaint  Patient presents with   Drug Overdose    Kaylee Warner is a 78 y.o. female.   Drug Overdose  Reported knee replacement 6 days ago.  Reportedly daily pain in the right knee and has been taking increased Vicodin.  Patient found with snoring respirations and hypotensive.  Sats in the 80s.  Oxygen placed.  Given a total of 8 mg of Narcan  by EMS.  Upon arrival was once again somnolent.  Reportedly told EMS she had taken 3 Vicodin.  It appears she had 30 pills filled on the 25th.    Past Medical History:  Diagnosis Date   Allergy    Anemia    Arthritis    Hypertension     Home Medications Prior to Admission medications   Medication Sig Start Date End Date Taking? Authorizing Provider  amLODipine  (NORVASC ) 10 MG tablet Take 1 tablet (10 mg total) by mouth daily. 04/03/23  Yes Ngetich, Dinah C, NP  aspirin  81 MG chewable tablet Chew 1 tablet (81 mg total) by mouth 2 (two) times daily. 06/04/23  Yes Bronson Canny, PA-C  donepezil  (ARICEPT ) 10 MG tablet Take 1 tablet (10 mg total) by mouth at bedtime. 03/30/23  Yes Ngetich, Dinah C, NP  HYDROcodone -acetaminophen  (NORCO/VICODIN) 5-325 MG tablet Take 1-2 tablets by mouth every 4 (four) hours as needed for severe pain (pain score 7-10). 06/04/23  Yes Bronson Canny, PA-C  lisinopril  (ZESTRIL ) 40 MG tablet Take 1 tablet (40 mg total) by mouth daily. 04/24/23  Yes Ngetich, Dinah C, NP  pregabalin  (LYRICA ) 25 MG capsule Take 1 capsule (25 mg total) by mouth 2 (two) times daily. 03/02/23 08/29/23 Yes Cephus Collin, MD  rosuvastatin  (CRESTOR ) 5 MG tablet Take on three times per week on Monday,Wednesday and Friday 04/13/23  Yes Ngetich, Dinah C, NP  sertraline  (ZOLOFT ) 25 MG tablet Take 2 tablets by mouth once daily 03/31/23  Yes Ngetich, Dinah C, NP  tiZANidine  (ZANAFLEX ) 4 MG tablet Take 1  tablet (4 mg total) by mouth 2 (two) times daily as needed. Patient taking differently: Take 4 mg by mouth at bedtime as needed for muscle spasms. 08/17/22  Yes Cephus Collin, MD  Magnesium  400 MG TABS Take 400 mg by mouth at bedtime.    [provider]      Allergies    Celecoxib, Codeine, Crestor  [rosuvastatin  calcium ], and Penicillins    Review of Systems   Review of Systems  Physical Exam Updated Vital Signs BP 112/63   Pulse 75   Temp 97.7 F (36.5 C) (Oral)   Resp 10   Ht 5\' 9"  (1.753 m)   Wt 78.5 kg   SpO2 100%   BMI 25.55 kg/m  Physical Exam Vitals and nursing note reviewed.  Eyes:     Comments: Pupils constricted  Cardiovascular:     Rate and Rhythm: Normal rate.  Pulmonary:     Breath sounds: No wheezing or rhonchi.  Abdominal:     Tenderness: There is no abdominal tenderness.  Musculoskeletal:     Cervical back: Neck supple.     Comments: Dressing over right knee.  Neurological:     Comments: Initially somnolent.  Some moaning to pain.  Nonverbal.     ED Results / Procedures / Treatments   Labs (all labs ordered are listed, but only  abnormal results are displayed) Labs Reviewed  COMPREHENSIVE METABOLIC PANEL WITH GFR - Abnormal; Notable for the following components:      Result Value   Glucose, Bld 138 (*)    Albumin 3.1 (*)    All other components within normal limits  CBC WITH DIFFERENTIAL/PLATELET - Abnormal; Notable for the following components:   WBC 10.6 (*)    Hemoglobin 10.2 (*)    HCT 31.7 (*)    MCV 76.8 (*)    MCH 24.7 (*)    Neutro Abs 8.8 (*)    All other components within normal limits  ACETAMINOPHEN  LEVEL  ETHANOL  RAPID URINE DRUG SCREEN, HOSP PERFORMED    EKG EKG Interpretation Date/Time:  Thursday Jun 08 2023 12:31:52 EDT Ventricular Rate:  74 PR Interval:  170 QRS Duration:  96 QT Interval:  419 QTC Calculation: 465 R Axis:   54  Text Interpretation: Sinus rhythm Abnormal R-wave progression, early transition  Confirmed by Mozell Arias [phone]) on 06/08/2023 1:11:20 PM  Radiology No results found.  Procedures Procedures    Medications Ordered in ED Medications  naloxone (NARCAN) injection 1 mg (1 mg Intravenous Given 06/08/23 1238)    ED Course/ Medical Decision Making/ A&P                                 Medical Decision Making Amount and/or Complexity of Data Reviewed Labs: ordered.  Risk Prescription drug management.   Patient with decreased mental status.  Improved by Narcan.  Most likely cause is opiate overdose.  Discussed with patient reportedly is been taking extra Vicodin due to the pain.  Had recurrence of the somnolence after 8 mg of Narcan by EMS.  Will give 1 mg here.  Patient has had some good results and now states she did take extra medicine.  However with the recurrence we will start a Narcan drip.  Patient's family is now here.  States she has not had extra Vicodin than she was prescribed.  Did get her medicines this morning but not extra Vicodin.  Mental status is improved some.  Still somewhat sedate but will respond more.  Will hold off on Narcan drip for now  Patient's mental status continues to improve.  Family states there is more swelling of the leg however.  Does have more erythema reportedly than a few days ago.  Mild warmth.  White count is improved from 5 days ago.  No fever.  However with unilateral swelling although likely secondary to the recent surgery will get ultrasound to evaluate for clots.  Care turned over to oncoming provider.  CRITICAL CARE Performed by: Mozell Arias Total critical care time: 30 minutes Critical care time was exclusive of separately billable procedures and treating other patients. Critical care was necessary to treat or prevent imminent or life-threatening deterioration. Critical care was time spent personally by me on the following activities: development of treatment plan with patient and/or surrogate as well as  nursing, discussions with consultants, evaluation of patient's response to treatment, examination of patient, obtaining history from patient or surrogate, ordering and performing treatments and interventions, ordering and review of laboratory studies, ordering and review of radiographic studies, pulse oximetry and re-evaluation of patient's condition.          Final Clinical Impression(s) / ED Diagnoses Final diagnoses:  Accidental overdose, initial encounter    Rx / DC Orders ED Discharge Orders     None  Mozell Arias, MD 06/08/23 [phone]

## 2023-06-08 NOTE — ED Provider Notes (Signed)
 Patient has been taking 2 Vicodin's every 4 hours because of the pain.  She is now awake and alert.  I suspect that she took too much narcotics.  She is told to decrease though she is only taking 1 Vicodin every 6 hours and she will add Tylenol  to help with some more pain.  She is also taking Lyrica .  She will follow-up with Corwin Dings, MD 06/08/23 1708

## 2023-06-08 NOTE — ED Triage Notes (Signed)
 Pt BIB [facility] from home for overdose on vicodin  Pt had knee replacement x 6 days ago and has been taking vicodin all night  When ems arrived pt was found with loud snoring respirations and BP 60/40 with O2 of 82%, ems placed NRB and O2 increased to 100%  Ems administered total of 8mg  narcan with 4mg  given last at 1215  Pt will become alert to voice but drowsy

## 2023-06-08 NOTE — Discharge Instructions (Signed)
 Follow back up with your orthopedic surgeon and you can take Tylenol  if necessary to help with the pain since we are decreasing your pain medicine

## 2023-06-13 ENCOUNTER — Telehealth: Payer: Self-pay

## 2023-06-13 DIAGNOSIS — I872 Venous insufficiency (chronic) (peripheral): Secondary | ICD-10-CM | POA: Diagnosis not present

## 2023-06-13 DIAGNOSIS — Z79899 Other long term (current) drug therapy: Secondary | ICD-10-CM | POA: Diagnosis not present

## 2023-06-13 DIAGNOSIS — D649 Anemia, unspecified: Secondary | ICD-10-CM | POA: Diagnosis not present

## 2023-06-13 DIAGNOSIS — Z9181 History of falling: Secondary | ICD-10-CM | POA: Diagnosis not present

## 2023-06-13 DIAGNOSIS — Z96651 Presence of right artificial knee joint: Secondary | ICD-10-CM | POA: Diagnosis not present

## 2023-06-13 DIAGNOSIS — E785 Hyperlipidemia, unspecified: Secondary | ICD-10-CM | POA: Diagnosis not present

## 2023-06-13 DIAGNOSIS — Z7982 Long term (current) use of aspirin: Secondary | ICD-10-CM | POA: Diagnosis not present

## 2023-06-13 DIAGNOSIS — M48062 Spinal stenosis, lumbar region with neurogenic claudication: Secondary | ICD-10-CM | POA: Diagnosis not present

## 2023-06-13 DIAGNOSIS — R600 Localized edema: Secondary | ICD-10-CM | POA: Diagnosis not present

## 2023-06-13 DIAGNOSIS — Z471 Aftercare following joint replacement surgery: Secondary | ICD-10-CM | POA: Diagnosis not present

## 2023-06-13 DIAGNOSIS — G894 Chronic pain syndrome: Secondary | ICD-10-CM | POA: Diagnosis not present

## 2023-06-13 DIAGNOSIS — I10 Essential (primary) hypertension: Secondary | ICD-10-CM | POA: Diagnosis not present

## 2023-06-13 DIAGNOSIS — M51361 Other intervertebral disc degeneration, lumbar region with lower extremity pain only: Secondary | ICD-10-CM | POA: Diagnosis not present

## 2023-06-13 NOTE — Telephone Encounter (Signed)
 Lauren called stating that patient has been running a low grade fever of 100.2 and has been given tylenol  and her right knee is hot.  Wanted to know if patient could be seen 06/14/2023 or just wait until appt. On Thursday, 06/15/2023.  Right knee surgery on 06/02/2023.  CB# [phone].  Please advise.  Thank you.

## 2023-06-13 NOTE — Telephone Encounter (Signed)
 Spoke with Kaylee Warner. Appt. Tomorrow 6/4 @8 :30am

## 2023-06-14 ENCOUNTER — Ambulatory Visit (INDEPENDENT_AMBULATORY_CARE_PROVIDER_SITE_OTHER): Admitting: Physician Assistant

## 2023-06-14 ENCOUNTER — Encounter: Payer: Self-pay | Admitting: Physician Assistant

## 2023-06-14 DIAGNOSIS — Z96651 Presence of right artificial knee joint: Secondary | ICD-10-CM

## 2023-06-14 MED ORDER — HYDROCODONE-ACETAMINOPHEN 5-325 MG PO TABS: 1.0000 | ORAL_TABLET | Freq: Four times a day (QID) | ORAL | 0 refills | Status: DC | PRN

## 2023-06-14 MED ORDER — PREGABALIN 25 MG PO CAPS: 50.0000 mg | ORAL_CAPSULE | Freq: Two times a day (BID) | ORAL | 5 refills | Status: DC

## 2023-06-14 NOTE — Progress Notes (Signed)
 HPI: Kaylee Warner comes in today for follow-up of her right knee.  She is here today due to the fact that she has been running fever 100.2.  She also has had increased swelling of her right knee.  Blood pressure dropped to 69/50 yesterday this was felt to be due to accidental overdose of pain medications.  She also reports that she has had some urinary incontinence.  She denies any dysuria.  Does note some burning about her right knee and is asking about increasing her Lyrica .  She has had no adverse effects to the Lyrica  in the past.  On aspirin  for DVT prophylaxis  Review of systems: See HPI  Physical exam: Temperature 98.8 F General well well-nourished alert and in no acute distress.  Ambulating with a rolling walker.  Right knee: Surgical incisions healing well staples well-approximated wound no signs of infection.  Calf supple nontender dorsiflexion plantarflexion.  Right knee passively I can bring to full extension she is flexing to approximately 90 degrees.  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  Elevation wiggling toes encouraged.  Will have her back for staple removal next Monday.  We did increase her Lyrica  to 50 mg twice daily.  Encouraged incentive spirometry.  Questions were encouraged and answered by Vanderbilt Gene, RN and myself .

## 2023-06-15 ENCOUNTER — Encounter: Admitting: Physician Assistant

## 2023-06-16 DIAGNOSIS — Z7982 Long term (current) use of aspirin: Secondary | ICD-10-CM | POA: Diagnosis not present

## 2023-06-16 DIAGNOSIS — Z471 Aftercare following joint replacement surgery: Secondary | ICD-10-CM | POA: Diagnosis not present

## 2023-06-16 DIAGNOSIS — E785 Hyperlipidemia, unspecified: Secondary | ICD-10-CM | POA: Diagnosis not present

## 2023-06-16 DIAGNOSIS — Z79899 Other long term (current) drug therapy: Secondary | ICD-10-CM | POA: Diagnosis not present

## 2023-06-16 DIAGNOSIS — F32A Depression, unspecified: Secondary | ICD-10-CM

## 2023-06-16 DIAGNOSIS — M51361 Other intervertebral disc degeneration, lumbar region with lower extremity pain only: Secondary | ICD-10-CM | POA: Diagnosis not present

## 2023-06-16 DIAGNOSIS — Z9181 History of falling: Secondary | ICD-10-CM | POA: Diagnosis not present

## 2023-06-16 DIAGNOSIS — M48062 Spinal stenosis, lumbar region with neurogenic claudication: Secondary | ICD-10-CM | POA: Diagnosis not present

## 2023-06-16 DIAGNOSIS — I10 Essential (primary) hypertension: Secondary | ICD-10-CM | POA: Diagnosis not present

## 2023-06-16 DIAGNOSIS — D649 Anemia, unspecified: Secondary | ICD-10-CM | POA: Diagnosis not present

## 2023-06-16 DIAGNOSIS — R600 Localized edema: Secondary | ICD-10-CM | POA: Diagnosis not present

## 2023-06-16 DIAGNOSIS — Z96651 Presence of right artificial knee joint: Secondary | ICD-10-CM | POA: Diagnosis not present

## 2023-06-16 DIAGNOSIS — G894 Chronic pain syndrome: Secondary | ICD-10-CM | POA: Diagnosis not present

## 2023-06-16 DIAGNOSIS — I872 Venous insufficiency (chronic) (peripheral): Secondary | ICD-10-CM | POA: Diagnosis not present

## 2023-06-17 DIAGNOSIS — R600 Localized edema: Secondary | ICD-10-CM | POA: Diagnosis not present

## 2023-06-17 DIAGNOSIS — Z9181 History of falling: Secondary | ICD-10-CM | POA: Diagnosis not present

## 2023-06-17 DIAGNOSIS — G894 Chronic pain syndrome: Secondary | ICD-10-CM | POA: Diagnosis not present

## 2023-06-17 DIAGNOSIS — I872 Venous insufficiency (chronic) (peripheral): Secondary | ICD-10-CM | POA: Diagnosis not present

## 2023-06-17 DIAGNOSIS — D649 Anemia, unspecified: Secondary | ICD-10-CM | POA: Diagnosis not present

## 2023-06-17 DIAGNOSIS — M48062 Spinal stenosis, lumbar region with neurogenic claudication: Secondary | ICD-10-CM | POA: Diagnosis not present

## 2023-06-17 DIAGNOSIS — Z7982 Long term (current) use of aspirin: Secondary | ICD-10-CM | POA: Diagnosis not present

## 2023-06-17 DIAGNOSIS — Z96651 Presence of right artificial knee joint: Secondary | ICD-10-CM | POA: Diagnosis not present

## 2023-06-17 DIAGNOSIS — M51361 Other intervertebral disc degeneration, lumbar region with lower extremity pain only: Secondary | ICD-10-CM | POA: Diagnosis not present

## 2023-06-17 DIAGNOSIS — E785 Hyperlipidemia, unspecified: Secondary | ICD-10-CM | POA: Diagnosis not present

## 2023-06-17 DIAGNOSIS — Z471 Aftercare following joint replacement surgery: Secondary | ICD-10-CM | POA: Diagnosis not present

## 2023-06-17 DIAGNOSIS — I10 Essential (primary) hypertension: Secondary | ICD-10-CM | POA: Diagnosis not present

## 2023-06-17 DIAGNOSIS — Z79899 Other long term (current) drug therapy: Secondary | ICD-10-CM | POA: Diagnosis not present

## 2023-06-19 ENCOUNTER — Encounter: Payer: Self-pay | Admitting: Physician Assistant

## 2023-06-19 ENCOUNTER — Ambulatory Visit (INDEPENDENT_AMBULATORY_CARE_PROVIDER_SITE_OTHER): Admitting: Physician Assistant

## 2023-06-19 DIAGNOSIS — Z96651 Presence of right artificial knee joint: Secondary | ICD-10-CM

## 2023-06-19 MED ORDER — PREGABALIN 25 MG PO CAPS: 50.0000 mg | ORAL_CAPSULE | Freq: Two times a day (BID) | ORAL | 2 refills | Status: DC

## 2023-06-19 NOTE — Progress Notes (Signed)
 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  She is overall doing well she has outpatient physical therapy that starts tomorrow.  Physical exam: Right knee full extension flexion to 85 degrees.  Staples well-approximated incision no signs of infection.  Calf supple nontender.  Impression: Status post right total knee arthroplasty  Plan: Staples harvested Steri-Strips applied.  She will continue work on range of motion and strengthening.  Lyrica  was again called in for her as she states her pharmacy did not receive the Lyrica .  Will see her back in 1 month sooner if there is any questions concerns

## 2023-06-20 DIAGNOSIS — M6281 Muscle weakness (generalized): Secondary | ICD-10-CM | POA: Diagnosis not present

## 2023-06-20 DIAGNOSIS — M25561 Pain in right knee: Secondary | ICD-10-CM | POA: Diagnosis not present

## 2023-06-20 DIAGNOSIS — R262 Difficulty in walking, not elsewhere classified: Secondary | ICD-10-CM | POA: Diagnosis not present

## 2023-06-20 DIAGNOSIS — Z471 Aftercare following joint replacement surgery: Secondary | ICD-10-CM | POA: Diagnosis not present

## 2023-06-21 ENCOUNTER — Ambulatory Visit: Payer: Self-pay

## 2023-06-21 ENCOUNTER — Encounter: Payer: Self-pay | Admitting: Family

## 2023-06-21 ENCOUNTER — Telehealth: Admitting: Family

## 2023-06-21 DIAGNOSIS — I10 Essential (primary) hypertension: Secondary | ICD-10-CM | POA: Diagnosis not present

## 2023-06-21 DIAGNOSIS — K5900 Constipation, unspecified: Secondary | ICD-10-CM | POA: Diagnosis not present

## 2023-06-21 DIAGNOSIS — K5901 Slow transit constipation: Secondary | ICD-10-CM

## 2023-06-21 MED ORDER — DOCUSATE SODIUM 100 MG PO CAPS: 100.0000 mg | ORAL_CAPSULE | Freq: Every day | ORAL | 0 refills | Status: DC

## 2023-06-21 NOTE — Telephone Encounter (Signed)
 Recommend office visit to check blood pressure

## 2023-06-21 NOTE — Progress Notes (Signed)
 This service is provided via telemedicine  No vital signs collected/recorded due to the encounter was a telemedicine visit.   Location of patient (ex: home, work):  Home  Patient consents to a telephone visit: Yes  Location of the provider (ex: office, home):  [facility], Office   Name of any referring provider:  N/A  Names of all persons participating in the telemedicine service and their role in the encounter:  Presley Broker, CMA, Patient, and Ha Placeres, Elijio Guadeloupe, NP   Time spent on call:  9 min with medical assistant      Provider: Fuquan Wilson FNP-C  Vesta Wheeland, Elijio Guadeloupe, NP  Patient Care Team: Aydan Phoenix, Elijio Guadeloupe, NP as PCP - General (Family Medicine)  Extended Emergency Contact Information Primary Emergency Contact: Mishkin,Roderick  [place] Mobile Phone: [phone] Relation: Son Secondary Emergency Contact: Gentry Kief Address: [address]          [place], [place] [address] [place] Home Phone: [phone] Relation: Spouse  Code Status:  Full Code  Goals of care: Advanced Directive information    06/21/2023    2:44 PM  Advanced Directives  Does Patient Have a Medical Advance Directive? No  Would patient like information on creating a medical advance directive? No - Patient declined     Chief Complaint  Patient presents with   Blood Pressure    Concerns about blood pressure.    Discussed the use of AI scribe software for clinical note transcription with the patient, who gave verbal consent to proceed.  History of Present Illness   Kaylee Warner is a 78 year old female with hypertension who presents with elevated blood pressure readings.  She has been experiencing significant fluctuations in her blood pressure, with readings as high as 250 mmHg and as low as 40/60 mmHg. she called the EMS and was taken to [facility] ED.she was treated for dehydration and blood pressure improved. She has been  monitoring her blood pressure at home and recently purchased a new blood pressure machine to ensure accurate readings.  She is currently taking amlodipine  10 mg and lisinopril  40 mg. She feels dehydrated, which she believes may be affecting her blood pressure control.  She is also experiencing constipation, which she attributes to her use of Hydrocodone  pain medication for right knee post-surgery.  No headaches, dizziness, nausea, vomiting, chest pain, or palpitations.    Past Medical History:  Diagnosis Date   Allergy    Anemia    Arthritis    Hypertension    Past Surgical History:  Procedure Laterality Date   BUNIONECTOMY Right    COLONOSCOPY     SARK 2009     Dr. Alm Jacks    TOE SURGERY Right    TOTAL KNEE ARTHROPLASTY Right 06/02/2023   Procedure: ARTHROPLASTY, KNEE, TOTAL;  Surgeon: Arnie Lao, MD;  Location: [facility]S;  Service: Orthopedics;  Laterality: Right;    Allergies  Allergen Reactions   Celecoxib     blood pressure went up   Codeine Itching   Crestor  [Rosuvastatin  Calcium ]     Muscle cramping and achy, tolerates 5 mg 3 time weekly    Penicillins Rash    Outpatient Encounter Medications as of 06/21/2023  Medication Sig   amLODipine  (NORVASC ) 10 MG tablet Take 1 tablet (10 mg total) by mouth daily.   aspirin  81 MG chewable tablet Chew 1 tablet (81 mg total) by mouth 2 (two) times daily.  docusate sodium  (COLACE) 100 MG capsule Take 1 capsule (100 mg total) by mouth daily.   donepezil  (ARICEPT ) 10 MG tablet Take 1 tablet (10 mg total) by mouth at bedtime.   HYDROcodone -acetaminophen  (NORCO/VICODIN) 5-325 MG tablet Take 1-2 tablets by mouth every 6 (six) hours as needed for moderate pain (pain score 4-6).   lisinopril  (ZESTRIL ) 40 MG tablet Take 1 tablet (40 mg total) by mouth daily.   pregabalin  (LYRICA ) 25 MG capsule Take 2 capsules (50 mg total) by mouth 2 (two) times daily.   rosuvastatin  (CRESTOR ) 5 MG tablet Take on three times per week on  Monday,Wednesday and Friday   sertraline  (ZOLOFT ) 25 MG tablet Take 2 tablets by mouth once daily   Magnesium  400 MG TABS Take 400 mg by mouth at bedtime. (Patient not taking: Reported on 06/21/2023)   tiZANidine  (ZANAFLEX ) 4 MG tablet Take 1 tablet (4 mg total) by mouth 2 (two) times daily as needed. (Patient taking differently: Take 4 mg by mouth at bedtime as needed for muscle spasms.)   No facility-administered encounter medications on file as of 06/21/2023.    Review of Systems  Constitutional:  Negative for appetite change, chills, fatigue, fever and unexpected weight change.  HENT:  Negative for congestion, ear discharge, ear pain, hearing loss, nosebleeds, postnasal drip, rhinorrhea, sinus pressure, sinus pain, sneezing and sore throat.   Eyes:  Negative for pain, discharge, redness, itching and visual disturbance.  Respiratory:  Negative for cough, chest tightness, shortness of breath and wheezing.   Cardiovascular:  Negative for chest pain, palpitations and leg swelling.  Gastrointestinal:  Negative for abdominal distention, abdominal pain, blood in stool, constipation, diarrhea, nausea and vomiting.  Genitourinary:  Negative for difficulty urinating, dysuria, flank pain, frequency and urgency.  Musculoskeletal:  Positive for arthralgias. Negative for back pain, gait problem, joint swelling and myalgias.       Right knee pain s/p replacement.working with PT  Skin:  Negative for color change, pallor and rash.  Neurological:  Negative for dizziness, syncope, speech difficulty, weakness, light-headedness, numbness and headaches.  Psychiatric/Behavioral:  Negative for agitation, behavioral problems, confusion, hallucinations and suicidal ideas. The patient is not nervous/anxious.     Immunization History  Administered Date(s) Administered   Fluad Quad(high Dose 65+) 10/29/2018, 10/14/2020   Influenza, High Dose Seasonal PF 11/24/2017, 09/20/2022   Influenza,inj,Quad PF,6+ Mos  09/24/2015, 12/15/2016   Moderna Sars-Covid-2 Vaccination 03/29/2019, 04/26/2019, 11/23/2019, 12/17/2019   Pneumococcal Conjugate-13 12/16/2015   Pneumococcal Polysaccharide-23 06/27/2018, 07/19/2021   Td 01/17/2017   Td,absorbed, Preservative Free, Adult Use, Lf Unspecified 11/23/2010   Tdap 11/23/2010   Zoster Recombinant(Shingrix) 06/20/2022   Pertinent  Health Maintenance Due  Topic Date Due   Colonoscopy  10/17/2021   INFLUENZA VACCINE  08/11/2023   DEXA SCAN  Completed      02/15/2023    1:46 PM 02/21/2023    8:55 AM 03/22/2023    2:42 PM 04/13/2023    1:07 PM 05/18/2023    2:48 PM  Fall Risk  Falls in the past year? 0 0 0 1 1  Was there an injury with Fall?  0  0 1  Fall Risk Category Calculator  0  1 3  Patient at Risk for Falls Due to    History of fall(s) History of fall(s);Impaired balance/gait;Orthopedic patient  Fall risk Follow up   Falls evaluation completed Falls evaluation completed Falls prevention discussed;Falls evaluation completed   Functional Status Survey:    There were no vitals filed for  this visit. There is no height or weight on file to calculate BMI. Physical Exam Constitutional:      General: She is not in acute distress.    Appearance: She is not ill-appearing.  Pulmonary:     Effort: Pulmonary effort is normal. No respiratory distress.  Neurological:     Mental Status: She is alert and oriented to person, place, and time.  Psychiatric:        Mood and Affect: Mood normal.        Behavior: Behavior normal.      Labs reviewed: Recent Labs    02/21/23 0935 05/25/23 0859 06/03/23 0340 06/08/23 1338  NA  --  139 137 137  K  --  3.7 3.5 3.7  CL  --  108 107 98  CO2  --  25 23 25   GLUCOSE  --  114* 132* 138*  BUN  --  11 14 10   CREATININE  --  0.59 0.57 0.65  CALCIUM   --  9.2 8.6* 8.9  MG 2.1  --   --   --    Recent Labs    02/07/23 1100 06/08/23 1338  AST 18 24  ALT 12 16  ALKPHOS  --  82  BILITOT 0.5 1.2  PROT 7.2 7.2   ALBUMIN  --  3.1*   Recent Labs    02/07/23 1100 05/25/23 0859 06/03/23 0340 06/08/23 1338  WBC 6.2 7.5 13.1* 10.6*  NEUTROABS 3,999  --   --  8.8*  HGB 12.6 12.1 10.8* 10.2*  HCT 40.4 39.8 34.9* 31.7*  MCV 76.2* 78.5* 77.7* 76.8*  PLT 337 322 279 356   Lab Results  Component Value Date   TSH 1.38 02/07/2023   Lab Results  Component Value Date   HGBA1C 6.2 (H) 02/07/2023   Lab Results  Component Value Date   CHOL 261 (H) 02/07/2023   HDL 66 02/07/2023   LDLCALC 169 (H) 02/07/2023   TRIG 126 02/07/2023   CHOLHDL 4.0 02/07/2023    Significant Diagnostic Results in last 30 days:  US  Venous Img Lower Unilateral Right Result Date: 06/08/2023 CLINICAL DATA:  RIGHT calf swelling status post recent RIGHT knee replacement EXAM: RIGHT LOWER EXTREMITY VENOUS DOPPLER ULTRASOUND TECHNIQUE: Gray-scale sonography with compression, as well as color and duplex ultrasound, were performed to evaluate the deep venous system(s) from the level of the common femoral vein through the popliteal and proximal calf veins. COMPARISON:  None available FINDINGS: VENOUS Normal compressibility of the common femoral, superficial femoral, and popliteal veins, as well as the visualized calf veins. Visualized portions of profunda femoral vein and great saphenous vein unremarkable. No filling defects to suggest DVT on grayscale or color Doppler imaging. Doppler waveforms show normal direction of venous flow, normal respiratory plasticity and response to augmentation. Limited views of the contralateral common femoral vein are unremarkable. OTHER None. Limitations: Calf veins were not well visualized. IMPRESSION: No right lower extremity DVT. Electronically Signed   By: Elester Grim M.D.   On: 06/08/2023 16:34   DG Knee Right Port Result Date: 06/02/2023 CLINICAL DATA:  Status post total right knee arthroplasty. EXAM: PORTABLE RIGHT KNEE - 1-2 VIEW COMPARISON:  Right knee radiographs 05/03/2023 FINDINGS: Interval  total right knee arthroplasty. No perihardware lucency is seen to indicate hardware failure or loosening. Expected postoperative changes including intra-articular and subcutaneous air. Smalljoint effusion. Anterior surgical skin staples. No acute fracture or dislocation. IMPRESSION: Interval total right knee arthroplasty without evidence of hardware failure. Electronically  Signed   By: Bertina Broccoli M.D.   On: 06/02/2023 18:57    Assessment/Plan  Hypertension Hypertension with recent blood pressure readings up to 250 mmHg, potentially due to dehydration and inconsistent medication adherence. She denies headache, dizziness, nausea, vomiting, chest pain, or palpitations. Emphasis on consistent medication adherence and adequate hydration to manage blood pressure effectively. - Monitor blood pressure twice daily for two weeks - Continue amlodipine  10 mg and lisinopril  40 mg as prescribed - Hold blood pressure medication if blood pressure is below 100/60 mmHg - Increase water  intake to prevent dehydration - Avoid excessive salt intake - Contact the clinic if blood pressure remains high after medication  Constipation Constipation likely exacerbated by pain medication. Advised to use stool softeners to alleviate symptoms. Colace (docusate sodium ) recommended as primary option, with Miralax as an alternative. Adequate hydration emphasized to aid in bowel movements. - Take Colace (docusate sodium ) 100 mg once daily, may increase to twice daily if needed - Consider using Miralax as an alternative stool softener - Increase water  intake to aid in bowel movements   Family/ staff Communication: Reviewed plan of care with patient verbalized understanding   Labs/tests ordered: None   Next Appointment: Return if symptoms worsen or fail to improve.  I connected with  Kaylee Warner on 06/21/23 by a video enabled telemedicine application and verified that I am speaking with the correct person using two  identifiers.   I discussed the limitations of evaluation and management by telemedicine. The patient expressed understanding and agreed to proceed.   Total time: 15 minutes. Greater than 50% of total time spent doing patient education regarding HTN,chronic right knee pain,health maintenance including symptom/medication management.     Estil Heman, NP

## 2023-06-21 NOTE — Telephone Encounter (Signed)
 Patient scheduled video visit today to discuss blood pressure because she is unable to come into office due to recent knee surgery

## 2023-06-21 NOTE — Telephone Encounter (Signed)
 Copied from CRM [id number]. Topic: Clinical - Red Word Triage >> Jun 21, 2023 10:06 AM Karole Pacer C wrote: Red Word that prompted transfer to Nurse Triage: Pain medication is interfering with her blood medication.  She's had an unstable blood pressure readings. Most recent was 173/110, patient also states she has been having some constipation.   Reason for Disposition  [1] Systolic BP  >= 180 OR Diastolic >= 110 AND [2] missed most recent dose of blood pressure medication    Asymptomatic at this time. Patient will take BP medication and call back if needed.  Answer Assessment - Initial Assessment Questions 1. BLOOD PRESSURE: What is the blood pressure? Did you take at least two measurements 5 minutes apart?     173/110 2. ONSET: When did you take your blood pressure?     Today 3. HOW: How did you take your blood pressure? (e.g., automatic home BP monitor, visiting nurse)     Automatic BP cuff 4. HISTORY: Do you have a history of high blood pressure?     Yes 5. MEDICINES: Are you taking any medicines for blood pressure? Have you missed any doses recently?     Has not taken them today, will take them and monitor blood pressure  6. OTHER SYMPTOMS: Do you have any symptoms? (e.g., blurred vision, chest pain, difficulty breathing, headache, weakness)     Constipation    Patient reports that she would like to know when she should hold her blood pressure medications, stating that her blood pressure is sometimes low in the morning and is unsure of when she should be taking to holding it. Please advise.  Protocols used: Blood Pressure - High-A-AH   FYI Only or Action Required?: Action required by provider  Patient was last seen in primary care on 05/18/2023 by Medina-Vargas, Sid Dragon, NP. Called Nurse Triage reporting Hypertension. Symptoms began today. Interventions attempted: Prescription medications: Will take BP medication. Symptoms are: unchanged.  Triage Disposition: Urgent  Home Treatment With Follow-up Call  Patient/caregiver understands and will follow disposition?: Yes

## 2023-06-23 DIAGNOSIS — M6281 Muscle weakness (generalized): Secondary | ICD-10-CM | POA: Diagnosis not present

## 2023-06-23 DIAGNOSIS — R262 Difficulty in walking, not elsewhere classified: Secondary | ICD-10-CM | POA: Diagnosis not present

## 2023-06-23 DIAGNOSIS — Z471 Aftercare following joint replacement surgery: Secondary | ICD-10-CM | POA: Diagnosis not present

## 2023-06-23 DIAGNOSIS — M25561 Pain in right knee: Secondary | ICD-10-CM | POA: Diagnosis not present

## 2023-06-26 DIAGNOSIS — M6281 Muscle weakness (generalized): Secondary | ICD-10-CM | POA: Diagnosis not present

## 2023-06-26 DIAGNOSIS — R262 Difficulty in walking, not elsewhere classified: Secondary | ICD-10-CM | POA: Diagnosis not present

## 2023-06-26 DIAGNOSIS — M25561 Pain in right knee: Secondary | ICD-10-CM | POA: Diagnosis not present

## 2023-06-26 DIAGNOSIS — Z471 Aftercare following joint replacement surgery: Secondary | ICD-10-CM | POA: Diagnosis not present

## 2023-06-27 ENCOUNTER — Telehealth: Payer: Self-pay | Admitting: Orthopaedic Surgery

## 2023-06-27 ENCOUNTER — Other Ambulatory Visit: Payer: Self-pay | Admitting: Orthopaedic Surgery

## 2023-06-27 MED ORDER — HYDROCODONE-ACETAMINOPHEN 5-325 MG PO TABS: 1.0000 | ORAL_TABLET | Freq: Four times a day (QID) | ORAL | 0 refills | Status: DC | PRN

## 2023-06-27 NOTE — Telephone Encounter (Signed)
 Patient called and said she needs a refill on hydrocodone . [phone]

## 2023-06-28 DIAGNOSIS — M6281 Muscle weakness (generalized): Secondary | ICD-10-CM | POA: Diagnosis not present

## 2023-06-28 DIAGNOSIS — M25561 Pain in right knee: Secondary | ICD-10-CM | POA: Diagnosis not present

## 2023-06-28 DIAGNOSIS — Z471 Aftercare following joint replacement surgery: Secondary | ICD-10-CM | POA: Diagnosis not present

## 2023-06-28 DIAGNOSIS — R262 Difficulty in walking, not elsewhere classified: Secondary | ICD-10-CM | POA: Diagnosis not present

## 2023-07-03 DIAGNOSIS — Z471 Aftercare following joint replacement surgery: Secondary | ICD-10-CM | POA: Diagnosis not present

## 2023-07-03 DIAGNOSIS — R262 Difficulty in walking, not elsewhere classified: Secondary | ICD-10-CM | POA: Diagnosis not present

## 2023-07-03 DIAGNOSIS — M25561 Pain in right knee: Secondary | ICD-10-CM | POA: Diagnosis not present

## 2023-07-03 DIAGNOSIS — M6281 Muscle weakness (generalized): Secondary | ICD-10-CM | POA: Diagnosis not present

## 2023-07-06 DIAGNOSIS — M6281 Muscle weakness (generalized): Secondary | ICD-10-CM | POA: Diagnosis not present

## 2023-07-06 DIAGNOSIS — M25561 Pain in right knee: Secondary | ICD-10-CM | POA: Diagnosis not present

## 2023-07-06 DIAGNOSIS — R262 Difficulty in walking, not elsewhere classified: Secondary | ICD-10-CM | POA: Diagnosis not present

## 2023-07-06 DIAGNOSIS — Z471 Aftercare following joint replacement surgery: Secondary | ICD-10-CM | POA: Diagnosis not present

## 2023-07-10 DIAGNOSIS — Z471 Aftercare following joint replacement surgery: Secondary | ICD-10-CM | POA: Diagnosis not present

## 2023-07-10 DIAGNOSIS — M25561 Pain in right knee: Secondary | ICD-10-CM | POA: Diagnosis not present

## 2023-07-10 DIAGNOSIS — R262 Difficulty in walking, not elsewhere classified: Secondary | ICD-10-CM | POA: Diagnosis not present

## 2023-07-10 DIAGNOSIS — M6281 Muscle weakness (generalized): Secondary | ICD-10-CM | POA: Diagnosis not present

## 2023-07-12 DIAGNOSIS — R262 Difficulty in walking, not elsewhere classified: Secondary | ICD-10-CM | POA: Diagnosis not present

## 2023-07-12 DIAGNOSIS — M6281 Muscle weakness (generalized): Secondary | ICD-10-CM | POA: Diagnosis not present

## 2023-07-12 DIAGNOSIS — M25561 Pain in right knee: Secondary | ICD-10-CM | POA: Diagnosis not present

## 2023-07-12 DIAGNOSIS — Z471 Aftercare following joint replacement surgery: Secondary | ICD-10-CM | POA: Diagnosis not present

## 2023-07-13 ENCOUNTER — Other Ambulatory Visit: Payer: Self-pay

## 2023-07-13 DIAGNOSIS — I10 Essential (primary) hypertension: Secondary | ICD-10-CM

## 2023-07-13 DIAGNOSIS — R7303 Prediabetes: Secondary | ICD-10-CM

## 2023-07-13 DIAGNOSIS — D509 Iron deficiency anemia, unspecified: Secondary | ICD-10-CM

## 2023-07-13 DIAGNOSIS — E782 Mixed hyperlipidemia: Secondary | ICD-10-CM

## 2023-07-17 ENCOUNTER — Ambulatory Visit: Admitting: Physician Assistant

## 2023-07-17 ENCOUNTER — Encounter: Payer: Self-pay | Admitting: Physician Assistant

## 2023-07-17 DIAGNOSIS — R262 Difficulty in walking, not elsewhere classified: Secondary | ICD-10-CM | POA: Diagnosis not present

## 2023-07-17 DIAGNOSIS — M25561 Pain in right knee: Secondary | ICD-10-CM | POA: Diagnosis not present

## 2023-07-17 DIAGNOSIS — Z96651 Presence of right artificial knee joint: Secondary | ICD-10-CM

## 2023-07-17 DIAGNOSIS — Z471 Aftercare following joint replacement surgery: Secondary | ICD-10-CM | POA: Diagnosis not present

## 2023-07-17 DIAGNOSIS — M6281 Muscle weakness (generalized): Secondary | ICD-10-CM | POA: Diagnosis not present

## 2023-07-17 MED ORDER — PREGABALIN 25 MG PO CAPS: 50.0000 mg | ORAL_CAPSULE | Freq: Two times a day (BID) | ORAL | 3 refills | Status: DC

## 2023-07-17 MED ORDER — IBUPROFEN 800 MG PO TABS: 400.0000 mg | ORAL_TABLET | Freq: Three times a day (TID) | ORAL | 0 refills | Status: DC

## 2023-07-17 NOTE — Progress Notes (Signed)
 HPI: Ms. 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  She is overall doing well.  Rates her pain to be 7 out of 10 pain at night.  She is taking Tylenol  only for pain.  She is also taking Lyrica  for paresthesias this is helpful.  Continue to work with therapy on range of motion and strengthening.  She is using a cane to ambulate.  Review systems: See HPI otherwise negative  Physical exam: General: Well-developed well-nourished female no acute distress mood and affect appropriate. Right knee: Lacks a few degrees of full extension flexes to 105 degrees actively passively I can bring her to 110.  Calf supple nontender.  No instability valgus varus stressing.  No abnormal warmth erythema or effusion.  Surgical incisions well-healed.  Impression: Status post right total knee arthroplasty   Plan: She will continue to work on range of motion and strengthening.  Refill on Lyrica  was given.  She will begin taking ibuprofen  for pain.  Follow-up with Dr. Tinnie in 1 month sooner if there is any questions or concerns.

## 2023-07-18 ENCOUNTER — Other Ambulatory Visit: Payer: Medicare Other

## 2023-07-19 DIAGNOSIS — Z471 Aftercare following joint replacement surgery: Secondary | ICD-10-CM | POA: Diagnosis not present

## 2023-07-19 DIAGNOSIS — R262 Difficulty in walking, not elsewhere classified: Secondary | ICD-10-CM | POA: Diagnosis not present

## 2023-07-19 DIAGNOSIS — M6281 Muscle weakness (generalized): Secondary | ICD-10-CM | POA: Diagnosis not present

## 2023-07-19 DIAGNOSIS — M25561 Pain in right knee: Secondary | ICD-10-CM | POA: Diagnosis not present

## 2023-07-23 ENCOUNTER — Other Ambulatory Visit: Payer: Self-pay | Admitting: Student in an Organized Health Care Education/Training Program

## 2023-07-23 ENCOUNTER — Other Ambulatory Visit: Payer: Self-pay | Admitting: Family

## 2023-07-23 DIAGNOSIS — G894 Chronic pain syndrome: Secondary | ICD-10-CM

## 2023-07-23 DIAGNOSIS — M5416 Radiculopathy, lumbar region: Secondary | ICD-10-CM

## 2023-07-23 DIAGNOSIS — J310 Chronic rhinitis: Secondary | ICD-10-CM

## 2023-07-25 DIAGNOSIS — M25561 Pain in right knee: Secondary | ICD-10-CM | POA: Diagnosis not present

## 2023-07-25 DIAGNOSIS — M6281 Muscle weakness (generalized): Secondary | ICD-10-CM | POA: Diagnosis not present

## 2023-07-25 DIAGNOSIS — Z471 Aftercare following joint replacement surgery: Secondary | ICD-10-CM | POA: Diagnosis not present

## 2023-07-25 DIAGNOSIS — R262 Difficulty in walking, not elsewhere classified: Secondary | ICD-10-CM | POA: Diagnosis not present

## 2023-07-26 ENCOUNTER — Other Ambulatory Visit: Payer: Self-pay | Admitting: *Deleted

## 2023-07-26 ENCOUNTER — Telehealth: Payer: Self-pay | Admitting: Student in an Organized Health Care Education/Training Program

## 2023-07-26 DIAGNOSIS — M25561 Pain in right knee: Secondary | ICD-10-CM | POA: Diagnosis not present

## 2023-07-26 DIAGNOSIS — G894 Chronic pain syndrome: Secondary | ICD-10-CM

## 2023-07-26 DIAGNOSIS — M6281 Muscle weakness (generalized): Secondary | ICD-10-CM | POA: Diagnosis not present

## 2023-07-26 DIAGNOSIS — Z471 Aftercare following joint replacement surgery: Secondary | ICD-10-CM | POA: Diagnosis not present

## 2023-07-26 DIAGNOSIS — R262 Difficulty in walking, not elsewhere classified: Secondary | ICD-10-CM | POA: Diagnosis not present

## 2023-07-26 DIAGNOSIS — M5416 Radiculopathy, lumbar region: Secondary | ICD-10-CM

## 2023-07-26 MED ORDER — TIZANIDINE HCL 4 MG PO TABS: 4.0000 mg | ORAL_TABLET | Freq: Two times a day (BID) | ORAL | 5 refills | Status: DC | PRN

## 2023-07-26 NOTE — Telephone Encounter (Signed)
 Rx request sent to [facility].

## 2023-07-26 NOTE — Telephone Encounter (Signed)
 PT called stated that she need refill on Tizanidine . Please give patient a call. TY

## 2023-07-29 ENCOUNTER — Other Ambulatory Visit: Payer: Self-pay | Admitting: Family

## 2023-07-29 DIAGNOSIS — J310 Chronic rhinitis: Secondary | ICD-10-CM

## 2023-07-31 NOTE — Telephone Encounter (Signed)
 Requested medication is not on active medication list, patient needs to schedule a video visit or in person visit to discuss

## 2023-08-02 DIAGNOSIS — Z471 Aftercare following joint replacement surgery: Secondary | ICD-10-CM | POA: Diagnosis not present

## 2023-08-02 DIAGNOSIS — M25561 Pain in right knee: Secondary | ICD-10-CM | POA: Diagnosis not present

## 2023-08-02 DIAGNOSIS — M6281 Muscle weakness (generalized): Secondary | ICD-10-CM | POA: Diagnosis not present

## 2023-08-02 DIAGNOSIS — R262 Difficulty in walking, not elsewhere classified: Secondary | ICD-10-CM | POA: Diagnosis not present

## 2023-08-04 DIAGNOSIS — Z471 Aftercare following joint replacement surgery: Secondary | ICD-10-CM | POA: Diagnosis not present

## 2023-08-04 DIAGNOSIS — R262 Difficulty in walking, not elsewhere classified: Secondary | ICD-10-CM | POA: Diagnosis not present

## 2023-08-04 DIAGNOSIS — M6281 Muscle weakness (generalized): Secondary | ICD-10-CM | POA: Diagnosis not present

## 2023-08-04 DIAGNOSIS — M25561 Pain in right knee: Secondary | ICD-10-CM | POA: Diagnosis not present

## 2023-08-08 ENCOUNTER — Encounter: Payer: Medicare Other | Admitting: Family

## 2023-08-08 DIAGNOSIS — Z471 Aftercare following joint replacement surgery: Secondary | ICD-10-CM | POA: Diagnosis not present

## 2023-08-08 DIAGNOSIS — R262 Difficulty in walking, not elsewhere classified: Secondary | ICD-10-CM | POA: Diagnosis not present

## 2023-08-08 DIAGNOSIS — M25561 Pain in right knee: Secondary | ICD-10-CM | POA: Diagnosis not present

## 2023-08-08 DIAGNOSIS — M6281 Muscle weakness (generalized): Secondary | ICD-10-CM | POA: Diagnosis not present

## 2023-08-10 DIAGNOSIS — M25561 Pain in right knee: Secondary | ICD-10-CM | POA: Diagnosis not present

## 2023-08-10 DIAGNOSIS — Z471 Aftercare following joint replacement surgery: Secondary | ICD-10-CM | POA: Diagnosis not present

## 2023-08-10 DIAGNOSIS — R262 Difficulty in walking, not elsewhere classified: Secondary | ICD-10-CM | POA: Diagnosis not present

## 2023-08-10 DIAGNOSIS — M6281 Muscle weakness (generalized): Secondary | ICD-10-CM | POA: Diagnosis not present

## 2023-08-17 ENCOUNTER — Telehealth: Payer: Self-pay | Admitting: Orthopaedic Surgery

## 2023-08-17 ENCOUNTER — Other Ambulatory Visit: Payer: Self-pay

## 2023-08-17 MED ORDER — CLINDAMYCIN HCL 150 MG PO CAPS: ORAL_CAPSULE | ORAL | 0 refills | Status: DC

## 2023-08-17 NOTE — Telephone Encounter (Signed)
 Patient called and wants to know how long is she suppose to be on the antibiotics with dental treatment? RA#[phone] Ask for Jori

## 2023-08-17 NOTE — Progress Notes (Signed)
 This encounter was created in error - please disregard.

## 2023-08-17 NOTE — Telephone Encounter (Signed)
 LMOM for patient letting her know I was calling antibiotic in for her since she had dental appt

## 2023-08-21 ENCOUNTER — Telehealth: Payer: Self-pay

## 2023-08-21 NOTE — Telephone Encounter (Signed)
 Patient would like know if she should take the antibiotics now.  Patient had dental appt.on Thursday, 08/17/2023.  CB# [phone].  Please advise.  Thank you.

## 2023-08-22 NOTE — Telephone Encounter (Signed)
 Patient states that the dentist had actually given her antibiotics last week and she is wondering if she should take what we gave her I told her no, I don't think we were aware they had already given her the antibiotics.

## 2023-08-30 ENCOUNTER — Ambulatory Visit (INDEPENDENT_AMBULATORY_CARE_PROVIDER_SITE_OTHER): Admitting: Orthopaedic Surgery

## 2023-08-30 ENCOUNTER — Encounter: Payer: Self-pay | Admitting: Orthopaedic Surgery

## 2023-08-30 DIAGNOSIS — Z96651 Presence of right artificial knee joint: Secondary | ICD-10-CM

## 2023-08-30 NOTE — Progress Notes (Signed)
 The patient is now greater than 3 months status post a right total knee replacement to treat her significant right knee pain and arthritis.  She is 78 years old.  She has been released from physical therapy.  She still reports stiffness and swelling with that right knee.  I did let her know that it takes good a while for those things to subside.  She is walking without an assistive device.  On exam her right knee does have a moderate effusion which is to be expected.  She lacks full extension by about 30 degrees and I can flex her to about 95 degrees.  She will continue to increase her activities as she tolerates and push herself through home exercise program.  The next time when he is here is in 3 months.  Will have a standing AP and lateral both knees at that visit.  She has been dealing with some chronic left knee issues.

## 2023-09-04 ENCOUNTER — Ambulatory Visit: Payer: Self-pay

## 2023-09-04 NOTE — Telephone Encounter (Signed)
 FYI Only or Action Required?: Action required by provider: Patient requesting information about what she can apply for a keloid.  Patient was last seen in primary care on 06/21/2023 by Ngetich, Roxan BROCKS, NP.  Called Nurse Triage reporting Keloid.  Symptoms began several days ago.  Interventions attempted: Nothing.  Symptoms are: stable.  Triage Disposition: Call PCP Within 24 Hours  Patient/caregiver understands and will follow disposition?: Yes    Message from [place] G sent at 09/04/2023 12:14 PM EDT  Question: what can be put on a tender keloid?   Reason for Disposition  [1] Caller has NON-URGENT question AND [2] triager unable to answer question  Answer Assessment - Initial Assessment Questions Requesting information about what she can keep on the keloid area. Pt states the best contact number is [phone] and is requesting a call back.    1. SYMPTOM: What's the main symptom you're concerned about? (e.g., drainage, fever, incision opened up, pain, redness, swelling)     Keloid in area where the incision is.  2. ONSET: When did Keloid start?     08/30/2023 3. DATE of SURGERY: When was the surgery?       06/02/2023 4. INCISION SITE: Where is the incision located? (e.g., right or left side; buttocks area [posterior approach], front groin area [anterior approach], outer side of hip [lateral approach].      R knee  5. REDNESS: Is there any redness at the incision site? If Yes, ask: How wide across is the redness? (e.g., inches, centimeters)      No 6. PAIN: Is there any pain? If Yes, ask: Where is it located? (e.g., hip, thigh, calf; right or left side). How bad is it?  (Scale 1-10; or mild, moderate, severe)     5/10 7. LEG SWELLING: Is there any leg swelling? If Yes, ask: Where is the swelling?  (e.g., right, left or both legs;  localized swelling just around incision, entire leg looks swollen, swollen area on thigh or calf). Is the swelling getting  worse, staying the same, or getting better?     Knee is still swelling pt states they said she would have swelling for 6 months  8. BLEEDING: Is there any bleeding? If Yes, ask: How much? and Where?     No 9. DRAINAGE: Is there any drainage from the incision site? If Yes, ask: What does it look like? (e.g., clear, cloudy, pink, red, yellow, pus). How much drainage is there? (e.g., drops, teaspoon)     No 10. FEVER: Do you have a fever? If Yes, ask: What is your temperature, how was it measured, and when did it start?       States he knee is warm  11. OTHER SYMPTOMS: Do you have any other symptoms? (e.g., dizziness, rash elsewhere on body, shaking chills, weakness)       No  Protocols used: Post-Op Total Hip Replacement Follow-up Call-A-AH

## 2023-09-04 NOTE — Telephone Encounter (Signed)
 Patient stated that she will wait and discuss options at her follow up appointment with Dinah. Stated that Dermatology will cost too much.

## 2023-09-04 NOTE — Telephone Encounter (Signed)
 1st attempt [id number]  Message from [place] G sent at 09/04/2023 12:14 PM EDT  Question: what can be put on a tender keloid?

## 2023-09-04 NOTE — Telephone Encounter (Signed)
 Recommend referral to dermatologist for further evaluation.

## 2023-09-12 ENCOUNTER — Ambulatory Visit (INDEPENDENT_AMBULATORY_CARE_PROVIDER_SITE_OTHER): Admitting: Family

## 2023-09-12 ENCOUNTER — Other Ambulatory Visit

## 2023-09-12 ENCOUNTER — Encounter: Payer: Self-pay | Admitting: Family

## 2023-09-12 VITALS — BP 126/74 | HR 78 | Temp 97.9°F | Resp 19 | Ht 69.0 in | Wt 172.0 lb

## 2023-09-12 DIAGNOSIS — M5136 Other intervertebral disc degeneration, lumbar region with discogenic back pain only: Secondary | ICD-10-CM

## 2023-09-12 DIAGNOSIS — N1831 Chronic kidney disease, stage 3a: Secondary | ICD-10-CM | POA: Diagnosis not present

## 2023-09-12 DIAGNOSIS — K5901 Slow transit constipation: Secondary | ICD-10-CM

## 2023-09-12 DIAGNOSIS — Z809 Family history of malignant neoplasm, unspecified: Secondary | ICD-10-CM

## 2023-09-12 DIAGNOSIS — D509 Iron deficiency anemia, unspecified: Secondary | ICD-10-CM

## 2023-09-12 DIAGNOSIS — Z1211 Encounter for screening for malignant neoplasm of colon: Secondary | ICD-10-CM | POA: Diagnosis not present

## 2023-09-12 DIAGNOSIS — R7303 Prediabetes: Secondary | ICD-10-CM

## 2023-09-12 DIAGNOSIS — I10 Essential (primary) hypertension: Secondary | ICD-10-CM | POA: Diagnosis not present

## 2023-09-12 DIAGNOSIS — E782 Mixed hyperlipidemia: Secondary | ICD-10-CM

## 2023-09-12 NOTE — Patient Instructions (Addendum)
 1.Go to local pharmacy to receive Influenza , Covid and Shingles vaccines. 2.Stop at check out and schedule Annual Wellness Visit.

## 2023-09-12 NOTE — Progress Notes (Signed)
 Provider: Roxan Plough FNP-C   Desi Rowe, Roxan BROCKS, NP  Patient Care Team: Lizzete Gough, Roxan BROCKS, NP as PCP - General (Family Medicine)  Extended Emergency Contact Information Primary Emergency Contact: Betton,Roderick  [place] Mobile Phone: [phone] Relation: Son Secondary Emergency Contact: Gust Lynwood SAUNDERS Address: [address]          [place], [place] [address] [place] Home Phone: [phone] Relation: Spouse  Code Status:  Full Code  Goals of care: Advanced Directive information    09/12/2023   10:16 AM  Advanced Directives  Does Patient Have a Medical Advance Directive? Yes  Type of Estate agent of Banks;Living will  Does patient want to make changes to medical advance directive? No - Patient declined  Copy of Healthcare Power of Attorney in Chart? No - copy requested     Chief Complaint  Patient presents with   Medical Management of Chronic Issues    Routine Visit and discuss right knee keloid.    Discussed the use of AI scribe software for clinical note transcription with the patient, who gave verbal consent to proceed.  History of Present Illness   Kaylee Warner is a 78 year old female who presents for follow-up and genetic testing referral.  She underwent knee surgery in [id number] and has completed physical therapy. She is concerned about the appearance of the scar on her knee, describing it as 'ugly'. She continues with exercises but feels lazy and lacks energy. She experiences stiffness in other parts of her body.  She has a history of arthritis affecting multiple joints, including her shoulders, and takes ibuprofen  400 mg three times daily. She mentions an advertisement for an arthritis shot but does not recall the name.  Her family history is significant for cancer, with a sister who recently passed away and another sister in hospice care. Her daughter, Jordyan Hardiman, mentions a familial ATM gene  mutation and is advocating for genetic testing for the family. She is interested in pursuing genetic testing to determine if patient carries the gene.  She has a history of fibroid tumors identified on an MRI. She reports no symptoms such as bleeding or discharge and is not currently seeking treatment for the fibroids.  She is managing prediabetes, with a previous A1c of 6.2. She has recently completed lab work to reassess her condition. Her weight has increased by two pounds since her last visit.  Her current medications include donepezil  10 mg at bedtime, Zoloft  50 mg daily, amlodipine  10 mg daily, rosuvastatin  5 mg every three days, lisinopril  40 mg daily, magnesium  400 mg at bedtime, baby aspirin  once daily, Lyrica  50 mg twice daily, ibuprofen  400 mg three times daily, tizanidine  4 mg at night, and vitamin D  2000 IU daily. She has recently added Flonase  and Allegra for sinus issues.  No current issues with constipation or diarrhea. She has resumed her exercise routine, including water  aerobics, to help manage her weight and arthritis symptoms.    Past Medical History:  Diagnosis Date   Allergy    Anemia    Arthritis    Hypertension    Past Surgical History:  Procedure Laterality Date   BUNIONECTOMY Right    COLONOSCOPY     [facility] 2009     Dr. 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   Procedure: ARTHROPLASTY, KNEE, TOTAL;  Surgeon: Vernetta Lonni GRADE, MD;  Location: [facility];  Service:  Orthopedics;  Laterality: Right;    Allergies  Allergen Reactions   Celecoxib     blood pressure went up   Codeine Itching   Crestor  [Rosuvastatin  Calcium ]     Muscle cramping and achy, tolerates 5 mg 3 time weekly    Penicillins Rash    Allergies as of 09/12/2023       Reactions   Celecoxib    blood pressure went up   Codeine Itching   Crestor  [rosuvastatin  Calcium ]    Muscle cramping and achy, tolerates 5 mg 3 time weekly    Penicillins Rash         Medication List        Accurate as of September 12, 2023  9:55 PM. If you have any questions, ask your nurse or doctor.          STOP taking these medications    docusate sodium  100 MG capsule Commonly known as: Colace Stopped by: Maizy Davanzo C Ammara Raj       TAKE these medications    Allegra Allergy 180 MG tablet Generic drug: fexofenadine Take 180 mg by mouth daily.   amLODipine  10 MG tablet Commonly known as: NORVASC  Take 1 tablet (10 mg total) by mouth daily.   aspirin  81 MG chewable tablet Chew 1 tablet (81 mg total) by mouth 2 (two) times daily. What changed: when to take this   clindamycin  150 MG capsule Commonly known as: CLEOCIN  Take 4 by mouth with food; following dental appointment   donepezil  10 MG tablet Commonly known as: ARICEPT  Take 1 tablet (10 mg total) by mouth at bedtime.   fluticasone  50 MCG/ACT nasal spray Commonly known as: FLONASE  Place 1 spray into both nostrils in the morning and at bedtime.   ibuprofen  800 MG tablet Commonly known as: ADVIL  Take 0.5 tablets (400 mg total) by mouth 3 (three) times daily.   lisinopril  40 MG tablet Commonly known as: ZESTRIL  Take 1 tablet (40 mg total) by mouth daily.   Magnesium  400 MG Tabs Take 400 mg by mouth at bedtime.   pregabalin  25 MG capsule Commonly known as: Lyrica  Take 2 capsules (50 mg total) by mouth 2 (two) times daily.   rosuvastatin  5 MG tablet Commonly known as: Crestor  Take on three times per week on Monday,Wednesday and Friday   sertraline  25 MG tablet Commonly known as: ZOLOFT  Take 2 tablets by mouth once daily   tiZANidine  4 MG tablet Commonly known as: ZANAFLEX  Take 1 tablet (4 mg total) by mouth 2 (two) times daily as needed.   VITAMIN D  (ERGOCALCIFEROL ) PO Take by mouth at bedtime.        Review of Systems  Constitutional:  Negative for appetite change, chills, fatigue, fever and unexpected weight change.  HENT:  Negative for congestion, dental problem, ear  discharge, ear pain, facial swelling, hearing loss, nosebleeds, postnasal drip, rhinorrhea, sinus pressure, sinus pain, sneezing, sore throat, tinnitus and trouble swallowing.   Eyes:  Negative for pain, discharge, redness, itching and visual disturbance.  Respiratory:  Negative for cough, chest tightness, shortness of breath and wheezing.   Cardiovascular:  Negative for chest pain, palpitations and leg swelling.  Gastrointestinal:  Negative for abdominal distention, abdominal pain, blood in stool, constipation, diarrhea, nausea and vomiting.  Endocrine: Negative for cold intolerance, heat intolerance, polydipsia, polyphagia and polyuria.  Genitourinary:  Negative for difficulty urinating, dysuria, flank pain, frequency and urgency.  Musculoskeletal:  Positive for arthralgias and gait problem. Negative for back pain, joint swelling, myalgias, neck  pain and neck stiffness.       S/p right Knee replacement   Skin:  Negative for color change, pallor, rash and wound.  Neurological:  Negative for dizziness, syncope, speech difficulty, weakness, light-headedness, numbness and headaches.  Hematological:  Does not bruise/bleed easily.  Psychiatric/Behavioral:  Negative for agitation, behavioral problems, confusion, hallucinations, self-injury, sleep disturbance and suicidal ideas. The patient is nervous/anxious.     Immunization History  Administered Date(s) Administered   Fluad Quad(high Dose 65+) 10/29/2018, 10/14/2020   INFLUENZA, HIGH DOSE SEASONAL PF 11/24/2017, 09/20/2022   Influenza,inj,Quad PF,6+ Mos 09/24/2015, 12/15/2016   Moderna Sars-Covid-2 Vaccination 03/29/2019, 04/26/2019, 11/23/2019, 12/17/2019   Pneumococcal Conjugate-13 12/16/2015   Pneumococcal Polysaccharide-23 06/27/2018, 07/19/2021   Td 01/17/2017   Td,absorbed, Preservative Free, Adult Use, Lf Unspecified 11/23/2010   Tdap 11/23/2010   Zoster Recombinant(Shingrix) 06/20/2022   Pertinent  Health Maintenance Due  Topic Date  Due   Colonoscopy  10/17/2021   INFLUENZA VACCINE  04/09/2024 (Originally 08/11/2023)   DEXA SCAN  Completed      02/21/2023    8:55 AM 03/22/2023    2:42 PM 04/13/2023    1:07 PM 05/18/2023    2:48 PM 09/12/2023   10:12 AM  Fall Risk  Falls in the past year? 0 0 1 1 0  Was there an injury with Fall? 0  0 1 0  Fall Risk Category Calculator 0  1 3 0  Patient at Risk for Falls Due to   History of fall(s) History of fall(s);Impaired balance/gait;Orthopedic patient No Fall Risks  Fall risk Follow up  Falls evaluation completed Falls evaluation completed Falls prevention discussed;Falls evaluation completed Falls evaluation completed   Functional Status Survey:    Vitals:   09/12/23 1021  BP: 126/74  Pulse: 78  Resp: 19  Temp: 97.9 F (36.6 C)  SpO2: 98%  Weight: 172 lb (78 kg)  Height: 5' 9 (1.753 m)   Body mass index is 25.4 kg/m. Physical Exam Physical Exam   VITALS: T- 97.9, P- 78, BP- 126/74, SaO2- 99% MEASUREMENTS: Weight- 172. GENERAL: Alert, cooperative, well developed, no acute distress HEENT: Normocephalic, normal oropharynx, moist mucous membranes, eyes normal, nose normal CHEST: Clear to auscultation bilaterally, no wheezes, rhonchi, or crackles CARDIOVASCULAR: Normal heart rate and rhythm, S1 and S2 normal without murmurs ABDOMEN: Soft, non-tender, non-distended, without organomegaly, normal bowel sounds EXTREMITIES: No cyanosis or edema MUSCULOSKELETAL: Knee healing well, no swelling or warmth in knee, hips without issues NEUROLOGICAL: Cranial nerves grossly intact, moves all extremities without gross motor or sensory deficit    SKIN: No rash,no lesion or erythema. Right knee incision healed with thick scar noted on incision  PSYCHIATRY/BEHAVIORAL: Mood stable    Labs reviewed: Recent Labs    02/21/23 0935 05/25/23 0859 06/03/23 0340 06/08/23 1338  NA  --  139 137 137  K  --  3.7 3.5 3.7  CL  --  108 107 98  CO2  --  25 23 25   GLUCOSE  --  114* 132* 138*   BUN  --  11 14 10   CREATININE  --  0.59 0.57 0.65  CALCIUM   --  9.2 8.6* 8.9  MG 2.1  --   --   --    Recent Labs    02/07/23 1100 06/08/23 1338  AST 18 24  ALT 12 16  ALKPHOS  --  82  BILITOT 0.5 1.2  PROT 7.2 7.2  ALBUMIN  --  3.1*   Recent Labs  02/07/23 1100 05/25/23 0859 06/03/23 0340 06/08/23 1338  WBC 6.2 7.5 13.1* 10.6*  NEUTROABS 3,999  --   --  8.8*  HGB 12.6 12.1 10.8* 10.2*  HCT 40.4 39.8 34.9* 31.7*  MCV 76.2* 78.5* 77.7* 76.8*  PLT 337 322 279 356   Lab Results  Component Value Date   TSH 1.38 02/07/2023   Lab Results  Component Value Date   HGBA1C 6.2 (H) 02/07/2023   Lab Results  Component Value Date   CHOL 261 (H) 02/07/2023   HDL 66 02/07/2023   LDLCALC 169 (H) 02/07/2023   TRIG 126 02/07/2023   CHOLHDL 4.0 02/07/2023    Significant Diagnostic Results in last 30 days:  No results found.  Assessment/Plan    Status post right knee arthroplasty Post-operative recovery from right knee arthroplasty in May. Healing is progressing well with no swelling or heat. Scar shows keloid formation, which may persist. No arthritis in the knee as confirmed by orthopedic specialist. 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 Reports stiffness in the rest of the body. 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 Recent creatinine level was 0.65, and GFR was within normal limits. - Monitor kidney function with regular blood  work  Mixed hyperlipidemia Mixed hyperlipidemia managed with rosuvastatin  5 mg every three days. - Continue rosuvastatin  5 mg every three days - Monitor lipid levels with regular blood work  Prediabetes Prediabetes with a previous A1c of 6.2. Emphasis on lifestyle modifications to prevent progression to diabetes. - Monitor A1c levels with regular blood work - Engineer, building services exercise and weight management to reduce blood sugar levels  Iron deficiency anemia Iron deficiency anemia not specifically addressed in this visit.  Uterine fibroids Uterine fibroids identified on MRI. Currently asymptomatic with no bleeding or discharge. Prefers to monitor and follow up with gynecologist if symptoms develop. - Monitor for symptoms such as bleeding or discomfort - Consider referral to gynecologist if symptoms develop  Allergic rhinitis Allergic rhinitis with sinus symptoms exacerbated by weather changes. Uses Flonase  and Allegra for symptom management. - Prescribe Flonase  nasal spray, one spray each nostril twice daily - Continue Allegra once daily  Depression and anxiety Depression and anxiety managed with Zoloft  50 mg daily. Also takes donepezil  for memory support. - Continue Zoloft  50 mg daily - Continue donepezil  10 mg at bedtime  Referral for cancer genetic testing (ATM gene) Family history of cancer with known ATM gene mutation. Referral for genetic testing to assess for ATM gene mutation. - Refer to cancer center for genetic testing for ATM gene mutation   Family/ staff Communication: Reviewed plan of care with patient verbalized understanding   Labs/tests ordered: Lab Orders in place   Next Appointment : Return in about 6 months (around 03/11/2024) for medical mangement of chronic issues., Annual wellness visit soon. SABRA   Spent 30 minutes of Face to face and non-face to face with patient  >50% time spent counseling; reviewing medical record; tests; labs; documentation and  developing future plan of care.   Roxan JAYSON Plough, NP

## 2023-09-13 ENCOUNTER — Ambulatory Visit: Payer: Self-pay | Admitting: Family

## 2023-09-13 LAB — COMPREHENSIVE METABOLIC PANEL WITH GFR
AG Ratio: 1.8 (calc) (ref 1.0–2.5)
ALT: 8 U/L (ref 6–29)
AST: 13 U/L (ref 10–35)
Albumin: 4.4 g/dL (ref 3.6–5.1)
Alkaline phosphatase (APISO): 78 U/L (ref 37–153)
BUN: 12 mg/dL (ref 7–25)
CO2: 29 mmol/L (ref 20–32)
Calcium: 9.7 mg/dL (ref 8.6–10.4)
Chloride: 106 mmol/L (ref 98–110)
Creat: 0.64 mg/dL (ref 0.60–1.00)
Globulin: 2.5 g/dL (ref 1.9–3.7)
Glucose, Bld: 105 mg/dL — ABNORMAL HIGH (ref 65–99)
Potassium: 3.9 mmol/L (ref 3.5–5.3)
Sodium: 144 mmol/L (ref 135–146)
Total Bilirubin: 0.6 mg/dL (ref 0.2–1.2)
Total Protein: 6.9 g/dL (ref 6.1–8.1)
eGFR: 90 mL/min/1.73m2 (ref >=60)

## 2023-09-13 LAB — CBC WITH DIFFERENTIAL/PLATELET
Absolute Lymphocytes: 1504 {cells}/uL (ref 850–3900)
Absolute Monocytes: 499 {cells}/uL (ref 200–950)
Basophils Absolute: 51 {cells}/uL (ref 0–200)
Basophils Relative: 0.8 %
Eosinophils Absolute: 262 {cells}/uL (ref 15–500)
Eosinophils Relative: 4.1 %
HCT: 39.6 % (ref 35.0–45.0)
Hemoglobin: 12 g/dL (ref 11.7–15.5)
MCH: 23.5 pg — ABNORMAL LOW (ref 27.0–33.0)
MCHC: 30.3 g/dL — ABNORMAL LOW (ref 32.0–36.0)
MCV: 77.5 fL — ABNORMAL LOW (ref 80.0–100.0)
MPV: 10.9 fL (ref 7.5–12.5)
Monocytes Relative: 7.8 %
Neutro Abs: 4083 {cells}/uL (ref 1500–7800)
Neutrophils Relative %: 63.8 %
Platelets: 363 Thousand/uL (ref 140–400)
RBC: 5.11 Million/uL — ABNORMAL HIGH (ref 3.80–5.10)
RDW: 15.6 % — ABNORMAL HIGH (ref 11.0–15.0)
Total Lymphocyte: 23.5 %
WBC: 6.4 Thousand/uL (ref 3.8–10.8)

## 2023-09-13 LAB — LIPID PANEL
Cholesterol: 174 mg/dL (ref <200)
HDL: 60 mg/dL (ref >=50)
LDL Cholesterol (Calc): 98 mg/dL
Non-HDL Cholesterol (Calc): 114 mg/dL (ref <130)
Total CHOL/HDL Ratio: 2.9 (calc) (ref <5.0)
Triglycerides: 71 mg/dL (ref <150)

## 2023-09-13 LAB — HEMOGLOBIN A1C
Hgb A1c MFr Bld: 6.3 % — ABNORMAL HIGH (ref <5.7)
Mean Plasma Glucose: 134 mg/dL
eAG (mmol/L): 7.4 mmol/L

## 2023-09-21 ENCOUNTER — Other Ambulatory Visit: Payer: Self-pay | Admitting: Family

## 2023-09-21 DIAGNOSIS — F321 Major depressive disorder, single episode, moderate: Secondary | ICD-10-CM

## 2023-10-10 ENCOUNTER — Telehealth: Payer: Self-pay | Admitting: *Deleted

## 2023-10-10 ENCOUNTER — Other Ambulatory Visit: Payer: Self-pay | Admitting: Physician Assistant

## 2023-10-10 DIAGNOSIS — Z96651 Presence of right artificial knee joint: Secondary | ICD-10-CM

## 2023-10-10 MED ORDER — IBUPROFEN 800 MG PO TABS: 400.0000 mg | ORAL_TABLET | Freq: Three times a day (TID) | ORAL | 0 refills | Status: DC

## 2023-10-10 MED ORDER — PREGABALIN 25 MG PO CAPS: 50.0000 mg | ORAL_CAPSULE | Freq: Two times a day (BID) | ORAL | 2 refills | Status: DC

## 2023-10-10 NOTE — Telephone Encounter (Signed)
 I spoke to patient today and she is over 90 days post op, but having a lot of swelling still and stiffness. She asked if she could get refills of her prescription Ibuprofen  and Lyrica  that you gave her previously. Pharmacy on chart. Thank you.

## 2023-10-12 NOTE — Telephone Encounter (Signed)
 This was sent in for refill. Patient aware.

## 2023-10-16 ENCOUNTER — Other Ambulatory Visit: Payer: Self-pay | Admitting: Family

## 2023-10-16 DIAGNOSIS — R413 Other amnesia: Secondary | ICD-10-CM

## 2023-10-31 ENCOUNTER — Inpatient Hospital Stay: Attending: Hematology | Admitting: Genetic Counselor

## 2023-10-31 ENCOUNTER — Encounter: Payer: Self-pay | Admitting: Genetic Counselor

## 2023-10-31 ENCOUNTER — Inpatient Hospital Stay

## 2023-10-31 ENCOUNTER — Other Ambulatory Visit: Payer: Self-pay | Admitting: Genetic Counselor

## 2023-10-31 DIAGNOSIS — Z800 Family history of malignant neoplasm of digestive organs: Secondary | ICD-10-CM

## 2023-10-31 DIAGNOSIS — Z1379 Encounter for other screening for genetic and chromosomal anomalies: Secondary | ICD-10-CM | POA: Insufficient documentation

## 2023-10-31 LAB — GENETIC SCREENING ORDER

## 2023-10-31 NOTE — Progress Notes (Signed)
 REFERRING PROVIDER: Leonarda Roxan BROCKS, NP [address] [place],  [place] [address]  PRIMARY PROVIDER:  Ngetich, Dinah C, NP  PRIMARY REASON FOR VISIT:  1. Family history of pancreatic cancer      HISTORY OF PRESENT ILLNESS:   Ms. Horkey, a 78 y.o. female, was seen for a [facility] cancer genetics consultation at the request of Roxan Leonarda, NP due to a family history of pancreatic cancer and an ATM pathogenic mutation.  Ms. Bodley presents to clinic today to discuss the possibility of a hereditary predisposition to cancer, genetic testing, and to further clarify her future cancer risks, as well as potential cancer risks for family members.   Ms. Rosado is a 78 y.o. female with no personal history of cancer.  She reports that her sister with pancreatic cancer, as well as a niece have tested positive for a pathogenic ATM mutation.  We do not have a report for this mutation.  CANCER HISTORY:  Oncology History   No history exists.     RISK FACTORS:  Menarche was at age 36.  First live birth at age 41.  OCP use for approximately 0 years.  Ovaries intact: yes.  Hysterectomy: no.  Menopausal status: postmenopausal.  HRT use: 0 years. Colonoscopy: yes; normal. Mammogram within the last year: yes. Number of breast biopsies: 0. Up to date with pelvic exams: n/a. Any excessive radiation exposure in the past: no  Past Medical History:  Diagnosis Date   Allergy    Anemia    Arthritis    Family history of pancreatic cancer    Hypertension     Past Surgical History:  Procedure Laterality Date   BUNIONECTOMY Right    COLONOSCOPY     [facility] 2009     Dr. 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   Procedure: ARTHROPLASTY, KNEE, TOTAL;  Surgeon: Vernetta Lonni GRADE, MD;  Location: [facility];  Service: Orthopedics;  Laterality: Right;    Social History   Socioeconomic History   Marital status: Married    Spouse name: Not on file   Number of children:  Not on file   Years of education: Not on file   Highest education level: Not on file  Occupational History   Occupation: cook   Tobacco Use   Smoking status: Never   Smokeless tobacco: Never  Vaping Use   Vaping status: Never Used  Substance and Sexual Activity   Alcohol use: Not Currently    Comment: occasionally   Drug use: No   Sexual activity: Never  Other Topics Concern   Not on file  Social History Narrative   Diet:Regular   Do you drink/eat things with caffeine? Yes   Marital status:  Married                             What year were you married?    Do you live in a house, apartment, assisted living, condo, trailer, etc)? House   Is it one or more stories? 2   How many persons live in your home? 2  Do you have any pets in your home? No   Current or past profession: Sports administrator   Do you exercise?   Yes  Type & how often: stretches   Do you have a living will? No    Do you have a DNR Form?  No   Do you have a POA/HPOA forms? No   Social Drivers of Corporate investment banker Strain: Low Risk  (12/15/2016)   Overall Financial Resource Strain (CARDIA)    Difficulty of Paying Living Expenses: Not hard at all  Food Insecurity: No Food Insecurity (06/02/2023)   Hunger Vital Sign    Worried About Running Out of Food in the Last Year: Never true    Ran Out of Food in the Last Year: Never true  Transportation Needs: No Transportation Needs (06/02/2023)   PRAPARE - Administrator, Civil Service (Medical): No    Lack of Transportation (Non-Medical): No  Physical Activity: Insufficiently Active (12/15/2016)   Exercise Vital Sign    Days of Exercise per Week: 1 day    Minutes of Exercise per Session: 30 min  Stress: Stress Concern Present (12/15/2016)   [employer] of Occupational Health - Occupational Stress Questionnaire    Feeling of Stress : To some extent  Social Connections: Socially Integrated  (06/02/2023)   Social Connection and Isolation Panel    Frequency of Communication with Friends and Family: More than three times a week    Frequency of Social Gatherings with Friends and Family: More than three times a week    Attends Religious Services: 1 to 4 times per year    Active Member of [employer] or Organizations: Yes    Attends Banker Meetings: 1 to 4 times per year    Marital Status: Married     FAMILY HISTORY:  We obtained a detailed, 4-generation family history.  Significant diagnoses are listed below: Family History  Problem Relation Age of Onset   Heart failure Mother    Cirrhosis Father    Diabetes Sister    Pancreatic cancer Sister    Stomach cancer Sister    Sjogren's syndrome Daughter    Diabetes Son    Hypertension Son    Diabetes Son    Diabetes Other        family history    Arthritis Other        family history    Colon polyps Neg Hx    Colon cancer Neg Hx    Esophageal cancer Neg Hx    Rectal cancer Neg Hx      The patient has three sons and a daughter who are cancer free.  She has a sister who died of pancreatic cancer and has an ATM mutation and a sister who died of stomach cancer.  She reports that her brother's daughter reportedly tested positive for that pathogenic ATM mutation.  There is no other report of cancer on either side of the family.  Ms. Tye is aware of previous family history of genetic testing for hereditary cancer risks.  There is no reported Ashkenazi Jewish ancestry. There is no known consanguinity.  GENETIC COUNSELING ASSESSMENT: Ms. Horgan is a 78 y.o. 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 We, therefore, discussed and recommended the following at today's visit.   DISCUSSION: We discussed that, in general, most cancer is not inherited in  families, but instead is sporadic or familial. Sporadic cancers occur by chance and typically happen at older  ages (>50 years) as this type of cancer is caused by genetic changes acquired during an individual's lifetime. Some families have more cancers than would be expected by chance; however, the ages or types of cancer are not consistent with a known genetic mutation or known genetic mutations have been ruled out. This type of familial cancer is thought to be due to a combination of multiple genetic, environmental, hormonal, and lifestyle factors. While this combination of factors likely increases the risk of cancer, the exact source of this risk is not currently identifiable or testable.  We discussed that 5 - 10% of cancer is hereditary, with most cases of pancreatic cancer being associated with BRCA mutations.  There are other genes that can be associated with hereditary pancreatic cancer syndromes.  These include ATM, PALB2 and CDKN2A.  The family reports having a pathogenic ATM mutation.  We discussed that this mutation increases the risk for both female breast cancer and pancreatic cancer in both men and women.  There is an increased risk for ovarian cancer as well, and possibly colon cancer, but there are not any recommendations for mitigating either of those cancers.  Based on the patient's sister being diagnosed with an ATM mutation, Ms. Herrada has a 50% chance of also having this mutation.  We discussed that testing is beneficial for several reasons including knowing how to follow individuals after completing their treatment, identifying whether potential treatment options such as PARP inhibitors would be beneficial, and understand if other family members could be at risk for cancer and allow them to undergo genetic testing.   We reviewed the characteristics, features and inheritance patterns of hereditary cancer syndromes. We also discussed genetic testing, including the appropriate family members  to test, the process of testing, insurance coverage and turn-around-time for results. We discussed the implications of a negative, positive and/or variant of uncertain significant result.  Ms. Mis decided to pursue genetic testing for the CancerNext-Expanded+RNAinsight gene panel.   The CancerNext-Expanded gene panel offered by [facility] and includes sequencing, rearrangement, and RNA analysis for the following 77 genes: AIP, ALK, APC, ATM, BAP1, BARD1, BMPR1A, BRCA1, BRCA2, BRIP1, CDC73, CDH1, CDK4, CDKN1B, CDKN2A, CEBPA, CHEK2, CTNNA1, DDX41, DICER1, ETV6, FH, FLCN, GATA2, LZTR1, MAX, MBD4, MEN1, MET, MLH1, MSH2, MSH3, MSH6, MUTYH, NF1, NF2, NTHL1, PALB2, PHOX2B, PMS2, POT1, PRKAR1A, PTCH1, PTEN, RAD51C, RAD51D, RB1, RET, RPS20, RUNX1, SDHA, SDHAF2, SDHB, SDHC, SDHD, SMAD4, SMARCA4, SMARCB1, SMARCE1, STK11, SUFU, TMEM127, TP53, TSC1, TSC2, VHL, and WT1 (sequencing and deletion/duplication); AXIN2, CTNNA1, DDX41, EGFR, HOXB13, KIT, MBD4, MITF, MSH3, PDGFRA, POLD1 and POLE (sequencing only); EPCAM and GREM1 (deletion/duplication only). RNA data is routinely analyzed for use in variant interpretation for all genes.   Based on Ms. Levandowski's family history of cancer and known ATM mutation, she meets medical criteria for genetic testing. Though Ms. Convery is not personally affected, there are no affected family members that are willing/able/available to undergo hereditary cancer testing.  Therefore, Ms. Cohenis the most informative family member available.  Despite that she meets criteria, she may still have an out of pocket cost. We discussed that if her out of pocket cost for testing is over $100, the laboratory will call and confirm whether she wants to proceed with testing.  If the out of pocket cost of testing is less than $100 she will be billed by the genetic testing laboratory.   We discussed that some people do not want to undergo genetic testing due to fear of genetic discrimination.  The Genetic  Information Nondiscrimination Act (GINA) was signed into federal law in 2008. GINA prohibits health insurers and most employers from discriminating against individuals based on genetic information (including the results of genetic tests and family history information). According to GINA, health insurance companies cannot consider genetic information to be a preexisting condition, nor can they use it to make decisions regarding coverage or rates. GINA also makes it illegal for most employers to use genetic information in making decisions about hiring, firing, promotion, or terms of employment. It is important to note that GINA does not offer protections for life insurance, disability insurance, or long-term care insurance. GINA does not apply to those in the [employer], those who work for companies with less than 15 employees, and new life insurance or long-term disability insurance policies.  Health status due to a cancer diagnosis is not protected under GINA. More information about GINA can be found by visiting [email or web address].  PLAN: After considering the risks, benefits, and limitations, Ms. Vitali provided informed consent to pursue genetic testing and the blood sample was sent to [facility] for analysis of the CancerNext-Expanded+RNAinsight. Results should be available within approximately 2-3 weeks' time, at which point they will be disclosed by telephone to Ms. Lance, as will any additional recommendations warranted by these results. Ms. Tuckerman will receive a summary of her genetic counseling visit and a copy of her results once available. This information will also be available in Epic.   Lastly, we encouraged Ms. Zemanek to remain in contact with cancer genetics annually so that we can continuously update the family history and inform her of any changes in cancer genetics and testing that may be of benefit for this family.   Ms. Char questions were answered to her satisfaction today.  Our contact information was provided should additional questions or concerns arise. Thank you for the referral and allowing us  to share in the care of your patient.   Lenardo Westwood P. Perri, MS, CGC Licensed, Patent attorney [email or web address] phone: [phone]  I personally spent a total of 60 minutes in the care of the patient today including preparing to see the patient, getting/reviewing separately obtained history, counseling and educating, placing orders, documenting clinical information in the EHR, and coordinating care. The patient was seen alone.  Drs. Lanny Stalls, and/or Gudena were available for questions, if needed..    _______________________________________________________________________ For Office Staff:  Number of people involved in session: 1 Was an Intern/ student involved with case: no

## 2023-11-06 ENCOUNTER — Encounter: Payer: Self-pay | Admitting: Family

## 2023-11-06 ENCOUNTER — Ambulatory Visit (INDEPENDENT_AMBULATORY_CARE_PROVIDER_SITE_OTHER): Admitting: Family

## 2023-11-06 VITALS — BP 136/82 | HR 67 | Temp 97.9°F | Resp 20 | Ht 69.0 in | Wt 175.0 lb

## 2023-11-06 DIAGNOSIS — Z0000 Encounter for general adult medical examination without abnormal findings: Secondary | ICD-10-CM | POA: Diagnosis not present

## 2023-11-06 DIAGNOSIS — Z1211 Encounter for screening for malignant neoplasm of colon: Secondary | ICD-10-CM | POA: Diagnosis not present

## 2023-11-06 DIAGNOSIS — Z1231 Encounter for screening mammogram for malignant neoplasm of breast: Secondary | ICD-10-CM | POA: Diagnosis not present

## 2023-11-06 MED ORDER — FLUTICASONE PROPIONATE 50 MCG/ACT NA SUSP: 1.0000 | Freq: Two times a day (BID) | NASAL | 5 refills | Status: DC

## 2023-11-06 NOTE — Progress Notes (Signed)
 Subjective:   Kaylee Warner is a 78 y.o. female who presents for Medicare Annual (Subsequent) preventive examination.  Visit Complete: In person  Patient Medicare AWV questionnaire was completed by the patient on 11/06/2023; I have confirmed that all information answered by patient is correct and no changes since this date.  Cardiac Risk Factors include: advanced age (>76men, >53 women);hypertension     Objective:    Today's Vitals   11/06/23 0818 11/06/23 0903 11/06/23 0911  BP:  136/82   Pulse:  67   Resp:  20   Temp:  97.9 F (36.6 C)   SpO2:  97%   Weight:  175 lb (79.4 kg)   Height: 5' 9 (1.753 m) 5' 9 (1.753 m)   PainSc:   8    Body mass index is 25.84 kg/m.     11/06/2023    8:56 AM 09/12/2023   10:16 AM 06/21/2023    2:44 PM 06/08/2023   12:37 PM 06/02/2023    6:56 PM 06/02/2023   10:09 AM 05/25/2023    9:00 AM  Advanced Directives  Does Patient Have a Medical Advance Directive? Yes Yes No No No No No  Type of Estate Agent of [place];Living will Healthcare Power of [place];Living will       Does patient want to make changes to medical advance directive? No - Patient declined No - Patient declined       Copy of Healthcare Power of Attorney in Chart? No - copy requested No - copy requested       Would patient like information on creating a medical advance directive?   No - Patient declined No - Patient declined No - Patient declined No - Patient declined No - Patient declined    Current Medications (verified) Outpatient Encounter Medications as of 11/06/2023  Medication Sig   amLODipine  (NORVASC ) 10 MG tablet Take 1 tablet (10 mg total) by mouth daily.   aspirin  81 MG chewable tablet Chew 1 tablet (81 mg total) by mouth 2 (two) times daily. (Patient taking differently: Chew 81 mg by mouth daily.)   donepezil  (ARICEPT ) 10 MG tablet TAKE 1 TABLET BY MOUTH AT BEDTIME   fexofenadine (ALLEGRA ALLERGY) 180 MG tablet Take 180 mg by mouth  daily.   ibuprofen  (ADVIL ) 800 MG tablet Take 0.5 tablets (400 mg total) by mouth 3 (three) times daily.   lisinopril  (ZESTRIL ) 40 MG tablet Take 1 tablet (40 mg total) by mouth daily.   Magnesium  400 MG TABS Take 400 mg by mouth at bedtime.   pregabalin  (LYRICA ) 25 MG capsule Take 2 capsules (50 mg total) by mouth 2 (two) times daily.   rosuvastatin  (CRESTOR ) 5 MG tablet Take on three times per week on Monday,Wednesday and Friday   sertraline  (ZOLOFT ) 25 MG tablet Take 2 tablets by mouth once daily   tiZANidine  (ZANAFLEX ) 4 MG tablet Take 1 tablet (4 mg total) by mouth 2 (two) times daily as needed.   VITAMIN D , ERGOCALCIFEROL , PO Take by mouth at bedtime.   clindamycin  (CLEOCIN ) 150 MG capsule Take 4 by mouth with food; following dental appointment (Patient not taking: Reported on 11/06/2023)   fluticasone  (FLONASE ) 50 MCG/ACT nasal spray Place 1 spray into both nostrils in the morning and at bedtime. (Patient not taking: Reported on 11/06/2023)   No facility-administered encounter medications on file as of 11/06/2023.    Allergies (verified) Celecoxib, Codeine, Crestor  [rosuvastatin  calcium ], and Penicillins   History: Past Medical History:  Diagnosis  Date   Allergy    Anemia    Arthritis    Family history of pancreatic cancer    Hypertension    Past Surgical History:  Procedure Laterality Date   BUNIONECTOMY Right    COLONOSCOPY     [facility] 2009     Dr. 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   Procedure: ARTHROPLASTY, KNEE, TOTAL;  Surgeon: Vernetta Lonni GRADE, MD;  Location: [facility];  Service: Orthopedics;  Laterality: Right;   Family History  Problem Relation Age of Onset   Heart failure Mother    Cirrhosis Father    Diabetes Sister    Pancreatic cancer Sister    Stomach cancer Sister    Sjogren's syndrome Daughter    Diabetes Son    Hypertension Son    Diabetes Son    Diabetes Other        family history    Arthritis Other         family history    Colon polyps Neg Hx    Colon cancer Neg Hx    Esophageal cancer Neg Hx    Rectal cancer Neg Hx    Social History   Socioeconomic History   Marital status: Married    Spouse name: Not on file   Number of children: Not on file   Years of education: Not on file   Highest education level: Not on file  Occupational History   Occupation: cook   Tobacco Use   Smoking status: Never   Smokeless tobacco: Never  Vaping Use   Vaping status: Never Used  Substance and Sexual Activity   Alcohol use: Not Currently    Comment: occasionally   Drug use: No   Sexual activity: Never  Other Topics Concern   Not on file  Social History Narrative   Diet:Regular   Do you drink/eat things with caffeine? Yes   Marital status:  Married                             What year were you married?    Do you live in a house, apartment, assisted living, condo, trailer, etc)? House   Is it one or more stories? 2   How many persons live in your home? 2  Do you have any pets in your home? No   Current or past profession: Sports administrator   Do you exercise?   Yes                                                  Type & how often: stretches   Do you have a living will? No    Do you have a DNR Form?  No   Do you have a POA/HPOA forms? No   Social Drivers of Corporate Investment Banker Strain: Low Risk  (12/15/2016)   Overall Financial Resource Strain (CARDIA)    Difficulty of Paying Living Expenses: Not hard at all  Food Insecurity: No Food Insecurity (11/06/2023)   Hunger Vital Sign    Worried About Running Out of Food in the Last Year: Never true    Ran Out of Food in the Last Year: Never true  Transportation Needs: No Transportation Needs (11/06/2023)   PRAPARE -  Administrator, Civil Service (Medical): No    Lack of Transportation (Non-Medical): No  Physical Activity: Insufficiently Active (12/15/2016)   Exercise Vital Sign    Days of Exercise per Week: 1 day    Minutes  of Exercise per Session: 30 min  Stress: Stress Concern Present (12/15/2016)   [employer] of Occupational Health - Occupational Stress Questionnaire    Feeling of Stress : To some extent  Social Connections: Socially Integrated (06/02/2023)   Social Connection and Isolation Panel    Frequency of Communication with Friends and Family: More than three times a week    Frequency of Social Gatherings with Friends and Family: More than three times a week    Attends Religious Services: 1 to 4 times per year    Active Member of [employer] or Organizations: Yes    Attends Banker Meetings: 1 to 4 times per year    Marital Status: Married    Tobacco Counseling Counseling given: Not Answered   Clinical Intake:  Pre-visit preparation completed: No  Pain : 0-10 Pain Score: 8  (right knee pain worst at night) Pain Type: Chronic pain Pain Location: Knee Pain Orientation: Right Pain Radiating Towards: No Pain Descriptors / Indicators: Aching Pain Onset: More than a month ago Pain Frequency: Constant Pain Relieving Factors: Pain medication Effect of Pain on Daily Activities: yes cleaning the house  Pain Relieving Factors: Pain medication  BMI - recorded: 25.84 Nutritional Status: BMI 25 -29 Overweight Nutritional Risks: None Diabetes: No  How often do you need to have someone help you when you read instructions, pamphlets, or other written materials from your doctor or pharmacy?: 3 - Sometimes (Grand daughter assist sometimes) What is the last grade level you completed in school?: 12 grade  Interpreter Needed?: No      Activities of Daily Living    11/06/2023    9:20 AM 06/02/2023    6:56 PM  In your present state of health, do you have any difficulty performing the following activities:  Hearing? 0 0  Vision? 0 0  Difficulty concentrating or making decisions? 1 0  Comment Remembering   Walking or climbing stairs? 1   Comment right knee pain   Dressing or  bathing? 1   Comment needs assist with puting socks on right leg   Doing errands, shopping? 0 0  Preparing Food and eating ? N   Using the Toilet? N   In the past six months, have you accidently leaked urine? N   Do you have problems with loss of bowel control? N   Managing your Medications? N   Managing your Finances? N   Housekeeping or managing your Housekeeping? N     Patient Care Team: Tayte Childers C, NP as PCP - General (Family Medicine)  Indicate any recent Medical Services you may have received from other than [facility] providers in the past year (date may be approximate).     Assessment:   This is a routine wellness examination for Kaylee Warner.  Hearing/Vision screen Hearing Screening - Comments:: No hearing issues. Vision Screening - Comments:: Eye exam March 2025 ( My eye doctor in [place]) no issues.   Goals Addressed             This Visit's Progress    Exercise 3x per week (30 min per time)   On track    Would like to increase exercise to 3 times weekly for 30 mins.  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 Eating less snacks foods)        Depression Screen    11/06/2023    8:58 AM 09/12/2023   10:15 AM 06/21/2023    2:45 PM 05/18/2023    2:48 PM 04/13/2023    1:08 PM 03/22/2023    2:42 PM 02/21/2023    8:55 AM  PHQ 2/9 Scores  PHQ - 2 Score 0 0 0 1 0 0 0  PHQ- 9 Score    8 3      Fall Risk    11/06/2023    8:58 AM 09/12/2023   10:12 AM 05/18/2023    2:48 PM 04/13/2023    1:07 PM 03/22/2023    2:42 PM  Fall Risk   Falls in the past year? 0 0 1 1 0  Number falls in past yr: 0 0 1 0 0  Injury with Fall? 0 0 1 0   Risk for fall due to : No Fall Risks No Fall Risks History of fall(s);Impaired balance/gait;Orthopedic patient History of fall(s)    Follow up Falls evaluation completed Falls evaluation completed Falls prevention discussed;Falls evaluation completed Falls evaluation completed Falls evaluation completed    MEDICARE RISK AT HOME: Medicare Risk at Home Any stairs in or around the home?: Yes If so, are there any without handrails?: No Home free of loose throw rugs in walkways, pet beds, electrical cords, etc?: Yes Adequate lighting in your home to reduce risk of falls?: Yes Life alert?: No Use of a cane, walker or w/c?: No Grab bars in the bathroom?: No Shower chair or bench in shower?: No Elevated toilet seat or a handicapped toilet?: No  TIMED UP AND GO:  Was the test performed?  Yes  Length of time to ambulate 10 feet: 15 sec Gait slow and steady without use of assistive device    Cognitive Function:    06/27/2018    1:33 PM 12/15/2016    3:56 PM 12/16/2015    3:21 PM 05/01/2015    2:05 PM  MMSE - Mini Mental State Exam  Not completed:    --  Orientation to time 5 3  5  4    Orientation to Place 5 5  5  4    Registration 3 3  3  3    Attention/ Calculation 5 5  5  5    Recall 3 2  3  3    Language- name 2 objects 2 2  2  2    Language- repeat 1 1 1 1   Language- follow 3 step command 3 3  3  3    Language- read & follow direction 1 1  1  1    Write a sentence 1 1  1  1    Copy design 1 1  1  1    Total score 30 27  30  28       Data saved with a previous flowsheet row definition        11/06/2023    8:58 AM 07/15/2020   12:59 PM 07/09/2019    2:04 PM  6CIT Screen  What Year? 0 points 0 points 0 points  What  month? 0 points 0 points 0 points  What time? 0 points 0 points 0 points  Count back from 20 0 points 0 points 0 points  Months in reverse 0 points 4 points 4 points  Repeat phrase 2 points 4 points 6 points  Total Score 2 points 8 points 10 points    Immunizations Immunization History  Administered Date(s) Administered   Fluad Quad(high Dose 65+) 10/29/2018, 10/14/2020   INFLUENZA, HIGH DOSE  SEASONAL PF 11/24/2017, 09/20/2022   Influenza,inj,Quad PF,6+ Mos 09/24/2015, 12/15/2016   Moderna Sars-Covid-2 Vaccination 03/29/2019, 04/26/2019, 11/23/2019, 12/17/2019   Pneumococcal Conjugate-13 12/16/2015   Pneumococcal Polysaccharide-23 06/27/2018, 07/19/2021   Td 01/17/2017   Td,absorbed, Preservative Free, Adult Use, Lf Unspecified 11/23/2010   Tdap 11/23/2010   Zoster Recombinant(Shingrix) 06/20/2022    TDAP status: Due, Education has been provided regarding the importance of this vaccine. Advised may receive this vaccine at local pharmacy or Health Dept. Aware to provide a copy of the vaccination record if obtained from local pharmacy or Health Dept. Verbalized acceptance and understanding.  Flu Vaccine status: Due, Education has been provided regarding the importance of this vaccine. Advised may receive this vaccine at local pharmacy or Health Dept. Aware to provide a copy of the vaccination record if obtained from local pharmacy or Health Dept. Verbalized acceptance and understanding.  Pneumococcal vaccine status: Up to date  Covid-19 vaccine status: Information provided on how to obtain vaccines.   Qualifies for Shingles Vaccine? Yes   Zostavax completed Yes   Shingrix Completed?: No.    Education has been provided regarding the importance of this vaccine. Patient has been advised to call insurance company to determine out of pocket expense if they have not yet received this vaccine. Advised may also receive vaccine at local pharmacy or Health Dept. Verbalized acceptance and understanding.  Screening Tests Health Maintenance  Topic Date Due   Colonoscopy  10/17/2021   COVID-19 Vaccine (5 - 2025-26 season) 12/12/2023 (Originally 09/11/2023)   Zoster Vaccines- Shingrix (2 of 2) 12/12/2023 (Originally 08/15/2022)   Influenza Vaccine  04/09/2024 (Originally 08/11/2023)   DTaP/Tdap/Td (3 - Td or Tdap) 09/11/2024 (Originally 11/22/2020)   Medicare Annual Wellness (AWV)  11/05/2024    Pneumococcal Vaccine: 50+ Years  Completed   DEXA SCAN  Completed   Hepatitis C Screening  Completed   Meningococcal B Vaccine  Aged Out   Mammogram  Discontinued    Health Maintenance  Health Maintenance Due  Topic Date Due   Colonoscopy  10/17/2021    Colorectal cancer screening: Type of screening: Colonoscopy. Completed 10/17/2016. Repeat every 5 years  Mammogram status: Completed 01/08/2020. Repeat every year  Bone Density status: Completed 08/17/2016. Results reflect: Bone density results: NORMAL. Repeat every 5 years.  Lung Cancer Screening: (Low Dose CT Chest recommended if Age 57-80 years, 20 pack-year currently smoking OR have quit w/in 15years.) does not qualify.   Lung Cancer Screening Referral: N/a   Additional Screening:  Hepatitis C Screening: does qualify; Completed Yes   Vision Screening: Recommended annual ophthalmology exams for early detection of glaucoma and other disorders of the eye. Is the patient up to date with their annual eye exam?  Yes  Who is the provider or what is the name of the office in which the patient attends annual eye exams? Dr.Myeye DR in [place]  If pt is not established with a provider, would they like to be referred to a provider to establish care? No .   Dental Screening: Recommended annual dental  exams for proper oral hygiene  Diabetic Foot Exam: Diabetic Foot Exam: Completed N/A   Community Resource Referral / Chronic Care Management: CRR required this visit?  No   CCM required this visit?  No     Plan:     I have personally reviewed and noted the following in the patient's chart:   Medical and social history Use of alcohol, tobacco or illicit drugs  Current medications and supplements including opioid prescriptions. Patient is not currently taking opioid prescriptions. Functional ability and status Nutritional status Physical activity Advanced directives List of other physicians Hospitalizations, surgeries, and ER  visits in previous 12 months Vitals Screenings to include cognitive, depression, and falls Referrals and appointments  In addition, I have reviewed and discussed with patient certain preventive protocols, quality metrics, and best practice recommendations. A written personalized care plan for preventive services as well as general preventive health recommendations were provided to patient.     Kaylee JAYSON Plough, NP   11/06/2023   After Visit Summary: (In Person-Printed) AVS printed and given to the patient  Nurse Notes: Advised to get vaccine at the Pharmacy

## 2023-11-06 NOTE — Patient Instructions (Signed)
 Kaylee Warner , Thank you for taking time to come for your Medicare Wellness Visit. I appreciate your ongoing commitment to your health goals. Please review the following plan we discussed and let me know if I can assist you in the future.   Screening recommendations/referrals: Colonoscopy : Referral order Mammogram : Ordered  Bone Density : Up to date  Recommended yearly ophthalmology/optometry visit for glaucoma screening and checkup Recommended yearly dental visit for hygiene and checkup  Vaccinations: Influenza vaccine- due annually in September/October Pneumococcal vaccine :  : Up to date Tdap vaccine : Please get vaccine at the Pharmacy  Shingles vaccine : Please get vaccine at the Pharmacy     Advanced directives: Yes   Conditions/risks identified:  advanced age (>9men, >78 women);hypertension  Next appointment: 1 year    Preventive Care 19 Years and Older, Female Preventive care refers to lifestyle choices and visits with your health care provider that can promote health and wellness. What does preventive care include? A yearly physical exam. This is also called an annual well check. Dental exams once or twice a year. Routine eye exams. Ask your health care provider how often you should have your eyes checked. Personal lifestyle choices, including: Daily care of your teeth and gums. Regular physical activity. Eating a healthy diet. Avoiding tobacco and drug use. Limiting alcohol use. Practicing safe sex. Taking low-dose aspirin  every day. Taking vitamin and mineral supplements as recommended by your health care provider. What happens during an annual well check? The services and screenings done by your health care provider during your annual well check will depend on your age, overall health, lifestyle risk factors, and family history of disease. Counseling  Your health care provider may ask you questions about your: Alcohol use. Tobacco use. Drug use. Emotional  well-being. Home and relationship well-being. Sexual activity. Eating habits. History of falls. Memory and ability to understand (cognition). Work and work astronomer. Reproductive health. Screening  You may have the following tests or measurements: Height, weight, and BMI. Blood pressure. Lipid and cholesterol levels. These may be checked every 5 years, or more frequently if you are over 53 years old. Skin check. Lung cancer screening. You may have this screening every year starting at age 47 if you have a 30-pack-year history of smoking and currently smoke or have quit within the past 15 years. Fecal occult blood test (FOBT) of the stool. You may have this test every year starting at age 14. Flexible sigmoidoscopy or colonoscopy. You may have a sigmoidoscopy every 5 years or a colonoscopy every 10 years starting at age 53. Hepatitis C blood test. Hepatitis B blood test. Sexually transmitted disease (STD) testing. Diabetes screening. This is done by checking your blood sugar (glucose) after you have not eaten for a while (fasting). You may have this done every 1-3 years. Bone density scan. This is done to screen for osteoporosis. You may have this done starting at age 3. Mammogram. This may be done every 1-2 years. Talk to your health care provider about how often you should have regular mammograms. Talk with your health care provider about your test results, treatment options, and if necessary, the need for more tests. Vaccines  Your health care provider may recommend certain vaccines, such as: Influenza vaccine. This is recommended every year. Tetanus, diphtheria, and acellular pertussis (Tdap, Td) vaccine. You may need a Td booster every 10 years. Zoster vaccine. You may need this after age 55. Pneumococcal 13-valent conjugate (PCV13) vaccine. One dose is  recommended after age 67. Pneumococcal polysaccharide (PPSV23) vaccine. One dose is recommended after age 28. Talk to your  health care provider about which screenings and vaccines you need and how often you need them. This information is not intended to replace advice given to you by your health care provider. Make sure you discuss any questions you have with your health care provider. Document Released: 01/23/2015 Document Revised: 09/16/2015 Document Reviewed: 10/28/2014 Elsevier Interactive Patient Education  2017 [employer].  Fall Prevention in the Home Falls can cause injuries. They can happen to people of all ages. There are many things you can do to make your home safe and to help prevent falls. What can I do on the outside of my home? Regularly fix the edges of walkways and driveways and fix any cracks. Remove anything that might make you trip as you walk through a door, such as a raised step or threshold. Trim any bushes or trees on the path to your home. Use bright outdoor lighting. Clear any walking paths of anything that might make someone trip, such as rocks or tools. Regularly check to see if handrails are loose or broken. Make sure that both sides of any steps have handrails. Any raised decks and porches should have guardrails on the edges. Have any leaves, snow, or ice cleared regularly. Use sand or salt on walking paths during winter. Clean up any spills in your garage right away. This includes oil or grease spills. What can I do in the bathroom? Use night lights. Install grab bars by the toilet and in the tub and shower. Do not use towel bars as grab bars. Use non-skid mats or decals in the tub or shower. If you need to sit down in the shower, use a plastic, non-slip stool. Keep the floor dry. Clean up any water  that spills on the floor as soon as it happens. Remove soap buildup in the tub or shower regularly. Attach bath mats securely with double-sided non-slip rug tape. Do not have throw rugs and other things on the floor that can make you trip. What can I do in the bedroom? Use night  lights. Make sure that you have a light by your bed that is easy to reach. Do not use any sheets or blankets that are too big for your bed. They should not hang down onto the floor. Have a firm chair that has side arms. You can use this for support while you get dressed. Do not have throw rugs and other things on the floor that can make you trip. What can I do in the kitchen? Clean up any spills right away. Avoid walking on wet floors. Keep items that you use a lot in easy-to-reach places. If you need to reach something above you, use a strong step stool that has a grab bar. Keep electrical cords out of the way. Do not use floor polish or wax that makes floors slippery. If you must use wax, use non-skid floor wax. Do not have throw rugs and other things on the floor that can make you trip. What can I do with my stairs? Do not leave any items on the stairs. Make sure that there are handrails on both sides of the stairs and use them. Fix handrails that are broken or loose. Make sure that handrails are as long as the stairways. Check any carpeting to make sure that it is firmly attached to the stairs. Fix any carpet that is loose or worn. Avoid having  throw rugs at the top or bottom of the stairs. If you do have throw rugs, attach them to the floor with carpet tape. Make sure that you have a light switch at the top of the stairs and the bottom of the stairs. If you do not have them, ask someone to add them for you. What else can I do to help prevent falls? Wear shoes that: Do not have high heels. Have rubber bottoms. Are comfortable and fit you well. Are closed at the toe. Do not wear sandals. If you use a stepladder: Make sure that it is fully opened. Do not climb a closed stepladder. Make sure that both sides of the stepladder are locked into place. Ask someone to hold it for you, if possible. Clearly mark and make sure that you can see: Any grab bars or handrails. First and last  steps. Where the edge of each step is. Use tools that help you move around (mobility aids) if they are needed. These include: Canes. Walkers. Scooters. Crutches. Turn on the lights when you go into a dark area. Replace any light bulbs as soon as they burn out. Set up your furniture so you have a clear path. Avoid moving your furniture around. If any of your floors are uneven, fix them. If there are any pets around you, be aware of where they are. Review your medicines with your doctor. Some medicines can make you feel dizzy. This can increase your chance of falling. Ask your doctor what other things that you can do to help prevent falls. This information is not intended to replace advice given to you by your health care provider. Make sure you discuss any questions you have with your health care provider. Document Released: 10/23/2008 Document Revised: 06/04/2015 Document Reviewed: 01/31/2014 Elsevier Interactive Patient Education  2017 [employer].

## 2023-11-08 ENCOUNTER — Telehealth: Payer: Self-pay

## 2023-11-08 MED ORDER — FLUTICASONE PROPIONATE 50 MCG/ACT NA SUSP: 1.0000 | Freq: Two times a day (BID) | NASAL | 5 refills | Status: AC

## 2023-11-08 NOTE — Telephone Encounter (Signed)
 Copied from CRM #[id number]. Topic: Clinical - Prescription Issue >> Nov 08, 2023 11:11 AM Diannia H wrote: Reason for CRM: Patient called to get her rx for fluticasone  (FLONASE ) 50 MCG/ACT nasal spray, it was sent to [facility] and the patient is not using that service just yet, could you reroute her rx to [facility] [address] [place] - [address] [place] [address] Phone: [phone] Fax: [phone] Hours: Not open 24 hours

## 2023-11-08 NOTE — Telephone Encounter (Signed)
Patient aware rx sent

## 2023-11-13 ENCOUNTER — Encounter: Payer: Self-pay | Admitting: Radiology

## 2023-11-21 ENCOUNTER — Encounter: Payer: Self-pay | Admitting: Genetic Counselor

## 2023-11-21 ENCOUNTER — Telehealth: Payer: Self-pay | Admitting: Genetic Counselor

## 2023-11-21 DIAGNOSIS — Z1509 Genetic susceptibility to other malignant neoplasm: Secondary | ICD-10-CM | POA: Insufficient documentation

## 2023-11-21 DIAGNOSIS — Z1379 Encounter for other screening for genetic and chromosomal anomalies: Secondary | ICD-10-CM | POA: Insufficient documentation

## 2023-11-21 NOTE — Telephone Encounter (Signed)
 Patient is not able to take results now.  She will CB.

## 2023-11-22 ENCOUNTER — Telehealth: Payer: Self-pay | Admitting: Genetic Counselor

## 2023-11-22 NOTE — Telephone Encounter (Signed)
 LM on VM that results are back and to please call.  Left CB instructions.

## 2023-11-27 ENCOUNTER — Ambulatory Visit: Payer: Self-pay | Admitting: Genetic Counselor

## 2023-11-27 DIAGNOSIS — Z1509 Genetic susceptibility to other malignant neoplasm: Secondary | ICD-10-CM

## 2023-11-27 DIAGNOSIS — Z1379 Encounter for other screening for genetic and chromosomal anomalies: Secondary | ICD-10-CM

## 2023-11-27 NOTE — Progress Notes (Signed)
 GENETIC TEST RESULTS   Patient Name: DHALIA ZINGARO Patient Age: 78 y.o. Encounter Date: 11/27/2023  Referring Provider: Roxan KYM Plough, NP    Ms. Fuhriman was seen in the Cancer Genetics clinic due to a family history of cancer and concern regarding a hereditary predisposition to cancer in the family. Please refer to the prior Genetics clinic note for more information regarding Ms. Lyter's medical and family histories and our assessment at the time.   FAMILY HISTORY:  We obtained a detailed, 4-generation family history.  Significant diagnoses are listed below: Family History  Problem Relation Age of Onset   Heart failure Mother    Cirrhosis Father    Diabetes Sister    Pancreatic cancer Sister    Stomach cancer Sister    Sjogren's syndrome Daughter    Diabetes Son    Hypertension Son    Diabetes Son    Diabetes Other        family history    Arthritis Other        family history    Colon polyps Neg Hx    Colon cancer Neg Hx    Esophageal cancer Neg Hx    Rectal cancer Neg Hx        The patient has three sons and a daughter who are cancer free.  She has a sister who died of pancreatic cancer and has an ATM mutation and a sister who died of stomach cancer.  She reports that her brother's daughter reportedly tested positive for that pathogenic ATM mutation.   There is no other report of cancer on either side of the family.   Ms. Melecio is aware of previous family history of genetic testing for hereditary cancer risks.  There is no reported Ashkenazi Jewish ancestry. There is no known consanguinity  GENETIC TESTING:  At the time of Ms. Mederos's visit, we recommended she pursue genetic testing of the CancerNext-Expanded+RNAinsight panel. 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   Genetic testing did  identify 3 Variants of uncertain significance (VUS) - one in the ALK gene called p.S1216G, a second in the APC gene called p.D1394N, and a third in the NTHL1 gene called p.S234L.  At this time, it is unknown if these variants are associated with increased cancer risk or if they are normal findings, but most variants such as these get reclassified to being inconsequential. They should not be used to make medical management decisions. With time, we suspect the lab will determine the significance of these variants, if any. If we do learn more about them, we will try to contact MS. Baisley to discuss it further. However, it is important to stay in touch with us  periodically and keep the address and phone number up to date.    Clinical Information: Hereditary breast due to pathogenic variants in ATM is characterized by an increased lifetime risk for, generally, adult-onset cancers including, breast, contralateral breast, ovarian, prostate, pancreatic and possibly colon cancer.  The cancers associated with ATM are:  Female breast cancer, up to an 24% risk In women with a history of breast cancer, the cumulative risk for contralateral breast cancer 10 years after breast cancer diagnosis is 4%.  Ovarian cancer, up to a 3% risk Pancreatic cancer, 5-10% risk Prostate cancer, elevated risk Colon cancer, up to a 10% risk    Management Recommendations:  Management for individuals with  ATM mutations can be found in the NCCN guidelines (v1.2026).  These guidelines recommend the following:  Breast Cancer    Absolute risk: 21-24% Screening: Annual mammogram with consideration of tomosynthesis at age 95 and consider breast MRI with contrast starting at age 37 - 3 years. Risk Reducing Mastectomy: Evidence insufficient, consider based on family history Contralateral 10 year breast cancer risk: 4%  Ovarian Cancer  Absolute risk: 2-3% Evidence is insufficient for risk-reducing salpingo-oophorectomy (RRSO).   This risk should be managed by family history.  Pancreatic Cancer   Absolute risk: ~5-10% Consider screening starting at age 20 years (or 10 years younger than the earliest exocrine pancreatic cancer diagnosis in the family, whichever is earlier) for individuals with exocrine pancreatic cancer in >=1 first- or second-degree relatives from the same side of (or presumed to be from the same side of) the family as the identified P/LP germline variant  Prostate Cancer  Emerging evidence for association with increased risk. Consider prostate cancer screening starting at age 53.  Colon Cancer  Estimated Absolute risk: ~5-10% Evidence insufficient to provide specialized CRC screening recommendations, manage based on family history   This information is based on current understanding of the gene and may change in the future.   FAMILY MEMBERS: Hereditary predisposition to cancer due to pathogenic variants in the ATM gene has autosomal dominant inheritance. This means that an individual with a pathogenic variant has a 50% chance of passing the condition on to their offspring. Once a pathogenic mutation is detected in an individual, it is possible to identify at-risk relatives who can pursue testing for this specific familial variant. Many cases are inherited from a parent, but some cases may occur spontaneously (i.e., an individual with a pathogenic variant who has parents who do not have it).  Individuals with a single pathogenic ATM variant are also carriers of autosomal recessive Ataxia Telangiectasia. Ataxia Telangiectasia is characterized by childhood onset of progressive neurologic manifestations, immunodeficiency, pulmonary disease,  and increased risk for cancer. For there to be a risk of Ataxia Telangiectasia in offspring, both the patient and their partner would each have to carry a pathogenic variant in ATM; in this case, the risk to have an affected child is 25%.It is important that all of Ms.  Tucci's relatives (both men and women) know of the presence of this gene mutation. Site-specific genetic testing can sort out who in the family is at risk and who is not. It is important that all of Ms. Aaberg's relatives (both men and women) know of the presence of this gene mutation. Site-specific genetic testing can sort out who in the family is at risk and who is not.   Ms. Matsuoka children and siblings have a 50% chance to have inherited this mutation. We recommend they have genetic testing for this same mutation, as identifying the presence of this mutation would allow them to also take advantage of risk-reducing measures.   PLAN:  Ms. Briones will be referred to the High Risk Breast Clinic.  Someone from their office will contact her within the next week. Ms. Kinsel will be referred to the Pancreatic Cancer Screening Program at [facility].  Someone will contact her in the next week. We encouraged Ms. Alles to remain in contact with us  on an annual basis so we can update her personal and family histories, and let her know of advances in cancer genetics that may benefit the family. Our contact number was provided. Ms. Luera questions were answered to her  satisfaction today, and she knows she is welcome to call anytime with additional questions.   Phylliss Strege P. Perri, MS, [facility] Licensed, Patent [email or web address] phone: [phone]

## 2023-11-27 NOTE — Telephone Encounter (Signed)
 Revealed that she tested positive for a pathogenic variant in ATM, and several VUS.  We will not change her medical management screening based on the VUS.  ATM increases the risk for breast, prostate and pancreatic cancer.  We can screen as high risk for breast cancer and can refer to the high risk breast clinic.  We can also refer to the pancreatic cancer program for high risk pancreatic cancer screening.  I offered to bring her in for additional genetic counseling.  She will think about it.  We recommend testing all of her children and siblings.  She will let them know.

## 2023-11-28 ENCOUNTER — Telehealth: Payer: Self-pay

## 2023-11-28 ENCOUNTER — Encounter: Payer: Self-pay | Admitting: Internal Medicine

## 2023-11-28 NOTE — Telephone Encounter (Signed)
-----   Message from Sandor LULLA Flatter sent at 11/28/2023  2:01 PM EST ----- Regarding: RE: pancreatic cancer screening No problem, happy to help out.  Nat, can you please schedule an OV with me or Dr. Wilhelmenia to discuss PC screening for ATM gene mutation. ----- Message ----- From: Perri Darice CROME, Counselor Sent: 11/27/2023   1:19 PM EST To: Aloha Wilhelmenia Raddle., MD; Sandor Flatter # Subject: pancreatic cancer screening                    Good afternoon!  This patient is positive for a pathogenic mutation in ATM and a family history of pancreatic cancer.  She would like to discuss pancraetic cancer screening.  She said that she has not had a colonoscopy, and was trying to get in with the [facility] up in [place].  As of now, she does not have an appointment up there.  I have not heard back from the patient with BRCA2, but will let you know what she is interested in doing once I hear.  Thank you!  KP

## 2023-11-28 NOTE — Telephone Encounter (Signed)
 Called 2 times and was sent straight to voicemail  LVM

## 2023-11-28 NOTE — Telephone Encounter (Signed)
 LVM

## 2023-11-29 NOTE — Telephone Encounter (Signed)
LVM again and letter sent 

## 2023-11-30 ENCOUNTER — Other Ambulatory Visit (INDEPENDENT_AMBULATORY_CARE_PROVIDER_SITE_OTHER)

## 2023-11-30 ENCOUNTER — Ambulatory Visit: Admitting: Orthopaedic Surgery

## 2023-11-30 ENCOUNTER — Encounter: Payer: Self-pay | Admitting: Orthopaedic Surgery

## 2023-11-30 ENCOUNTER — Other Ambulatory Visit

## 2023-11-30 DIAGNOSIS — Z96651 Presence of right artificial knee joint: Secondary | ICD-10-CM | POA: Diagnosis not present

## 2023-11-30 DIAGNOSIS — G8929 Other chronic pain: Secondary | ICD-10-CM

## 2023-11-30 DIAGNOSIS — M25562 Pain in left knee: Secondary | ICD-10-CM

## 2023-11-30 NOTE — Progress Notes (Signed)
 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  She comes in today for routine follow-up as a relates to her right knee replacement but is also dealing with left knee pain.  She is an active 78 year old female.  She does report knee swelling on the right knee but says the alignment is much better than it was before surgery.  She does report stiffness.  At our last visit she had been dealing with some chronic left knee pain but she says right now that left knee now feels fine.  She has no issues with her left knee.  She is walking without assistive device.  On exam her right knee does still show moderate swelling.  Her extension is full and her flexion is past 100 degrees.  The knee feels stable.  Her left knee is asymptomatic with normal alignment and good range of motion and no swelling.  2 views of the right knee show well-seated right total knee arthroplasty with no complicating features.  2 views of the left knee show no acute findings which is some mild arthritic changes.  Hopefully the swelling should continue to dissipate as time goes by.  The next time we need to see is not for 6 months.  At that visit we will have a final AP and lateral of her right knee.  If there are issues before then she knows to let us  know.

## 2023-12-04 ENCOUNTER — Other Ambulatory Visit: Payer: Self-pay | Admitting: Family

## 2023-12-04 DIAGNOSIS — Z96651 Presence of right artificial knee joint: Secondary | ICD-10-CM

## 2023-12-04 DIAGNOSIS — M5416 Radiculopathy, lumbar region: Secondary | ICD-10-CM

## 2023-12-04 DIAGNOSIS — G894 Chronic pain syndrome: Secondary | ICD-10-CM

## 2023-12-04 DIAGNOSIS — F321 Major depressive disorder, single episode, moderate: Secondary | ICD-10-CM

## 2023-12-04 DIAGNOSIS — I10 Essential (primary) hypertension: Secondary | ICD-10-CM

## 2023-12-04 DIAGNOSIS — R413 Other amnesia: Secondary | ICD-10-CM

## 2023-12-04 DIAGNOSIS — E782 Mixed hyperlipidemia: Secondary | ICD-10-CM

## 2023-12-04 NOTE — Telephone Encounter (Unsigned)
 Copied from CRM #[id number]. Topic: Clinical - Medication Refill >> Dec 04, 2023 10:29 AM Carrielelia G wrote: Medication: refills for  donepezil  (ARICEPT ) 10 MG tablet rosuvastatin  (CRESTOR ) 5 MG tablet amLODipine  (NORVASC ) 10 MG tablet sertraline  (ZOLOFT ) 25 MG tablet lisinopril  (ZESTRIL ) 40 MG tablet ibuprofen  (ADVIL ) 800 MG tablet pregabalin  (LYRICA ) 25 MG capsule tiZANidine  (ZANAFLEX ) 4 MG tablet clindamycin  (CLEOCIN ) 150 MG capsule   Caller is Jasmine with [facility] - [place], [place] - [address] [[id number]] Also stated they have sent multiple faxes in regards to these medications

## 2023-12-04 NOTE — Telephone Encounter (Signed)
 Left message for patient to return call to office. Received a refill request for patients prescriptions [facility] to [facility]. I wanted to confirm that this is authorized by the patient before sending.

## 2023-12-06 ENCOUNTER — Other Ambulatory Visit: Payer: Self-pay

## 2023-12-06 DIAGNOSIS — E782 Mixed hyperlipidemia: Secondary | ICD-10-CM

## 2023-12-06 DIAGNOSIS — F321 Major depressive disorder, single episode, moderate: Secondary | ICD-10-CM

## 2023-12-06 DIAGNOSIS — M5416 Radiculopathy, lumbar region: Secondary | ICD-10-CM

## 2023-12-06 DIAGNOSIS — R413 Other amnesia: Secondary | ICD-10-CM

## 2023-12-06 DIAGNOSIS — Z96651 Presence of right artificial knee joint: Secondary | ICD-10-CM

## 2023-12-06 DIAGNOSIS — G894 Chronic pain syndrome: Secondary | ICD-10-CM

## 2023-12-06 DIAGNOSIS — I10 Essential (primary) hypertension: Secondary | ICD-10-CM

## 2023-12-06 MED ORDER — SERTRALINE HCL 25 MG PO TABS: 50.0000 mg | ORAL_TABLET | Freq: Every day | ORAL | 1 refills | Status: AC

## 2023-12-06 MED ORDER — TIZANIDINE HCL 4 MG PO TABS: 4.0000 mg | ORAL_TABLET | Freq: Two times a day (BID) | ORAL | 1 refills | Status: DC | PRN

## 2023-12-06 MED ORDER — LISINOPRIL 40 MG PO TABS: 40.0000 mg | ORAL_TABLET | Freq: Every day | ORAL | 1 refills | Status: AC

## 2023-12-06 MED ORDER — AMLODIPINE BESYLATE 10 MG PO TABS: 10.0000 mg | ORAL_TABLET | Freq: Every day | ORAL | 1 refills | Status: AC

## 2023-12-06 MED ORDER — ROSUVASTATIN CALCIUM 5 MG PO TABS: ORAL_TABLET | ORAL | 1 refills | Status: AC

## 2023-12-06 MED ORDER — CLINDAMYCIN HCL 150 MG PO CAPS: ORAL_CAPSULE | ORAL | 0 refills | Status: AC

## 2023-12-06 MED ORDER — DONEPEZIL HCL 10 MG PO TABS: 10.0000 mg | ORAL_TABLET | Freq: Every day | ORAL | 1 refills | Status: DC

## 2023-12-06 MED ORDER — PREGABALIN 25 MG PO CAPS
50.0000 mg | ORAL_CAPSULE | Freq: Two times a day (BID) | ORAL | 1 refills | Status: AC
Start: 2023-12-06 — End: 2024-06-03

## 2023-12-06 MED ORDER — IBUPROFEN 800 MG PO TABS: 400.0000 mg | ORAL_TABLET | Freq: Three times a day (TID) | ORAL | 0 refills | Status: DC

## 2023-12-06 NOTE — Telephone Encounter (Signed)
 All rx's sent EXCEPT  1.) Lyrica - (controlled, provider to review)  2.) Ibuprofen  800 mg, (provider to review, not usually meant for long term use at this dose)  3.) Tizanidine  4 mg (PRN medication for provider to review)

## 2023-12-06 NOTE — Telephone Encounter (Signed)
 LVM again and letter was sent out 11-29-23

## 2023-12-06 NOTE — Telephone Encounter (Signed)
 Copied from CRM #[id number]. Topic: Clinical - Medication Refill >> Dec 04, 2023 10:29 AM Carrielelia G wrote: Medication: refills for  donepezil  (ARICEPT ) 10 MG tablet rosuvastatin  (CRESTOR ) 5 MG tablet amLODipine  (NORVASC ) 10 MG tablet sertraline  (ZOLOFT ) 25 MG tablet lisinopril  (ZESTRIL ) 40 MG tablet ibuprofen  (ADVIL ) 800 MG tablet pregabalin  (LYRICA ) 25 MG capsule tiZANidine  (ZANAFLEX ) 4 MG tablet clindamycin  (CLEOCIN ) 150 MG capsule   Caller is Jasmine with [facility] - [place] - [address] [[id number]] Also stated they have sent multiple faxes in regards to these medications >> Dec 06, 2023 11:40 AM Chiquita SQUIBB wrote: Rolin from [employer] is calling in to get an update on the medications, advised her that we are awaiting conformation from the patient. Jasmine requested the patient be reached out to again so they can start on these medications.  >> Dec 04, 2023  4:46 PM Debby BROCKS wrote: Patient reached out and confirmed that she did authorize this refill req for [facility]

## 2023-12-06 NOTE — Telephone Encounter (Signed)
 Pregabalin  is managed by another provider ETTER Bertrum Adonis CARROLYN). Please contact appropriate provider for Pregabalin .

## 2023-12-17 ENCOUNTER — Other Ambulatory Visit: Payer: Self-pay | Admitting: Family

## 2023-12-17 DIAGNOSIS — F321 Major depressive disorder, single episode, moderate: Secondary | ICD-10-CM

## 2024-01-12 ENCOUNTER — Other Ambulatory Visit: Payer: Self-pay | Admitting: Family

## 2024-01-12 DIAGNOSIS — R413 Other amnesia: Secondary | ICD-10-CM

## 2024-01-17 ENCOUNTER — Ambulatory Visit

## 2024-01-17 ENCOUNTER — Encounter (HOSPITAL_COMMUNITY): Payer: Self-pay

## 2024-01-17 ENCOUNTER — Ambulatory Visit (HOSPITAL_COMMUNITY)
Admission: RE | Admit: 2024-01-17 | Discharge: 2024-01-17 | Disposition: A | Discharge status: Home / Self Care | Source: Ambulatory Visit | Attending: Family | Admitting: Family

## 2024-01-17 DIAGNOSIS — Z1231 Encounter for screening mammogram for malignant neoplasm of breast: Secondary | ICD-10-CM | POA: Diagnosis present

## 2024-01-22 ENCOUNTER — Other Ambulatory Visit: Payer: Self-pay | Admitting: Family

## 2024-01-22 NOTE — Telephone Encounter (Signed)
 High risk or very high risk warning populated when attempting to refill medication. RX request sent to PCP for review and approval if warranted.

## 2024-01-22 NOTE — Telephone Encounter (Unsigned)
 Copied from CRM #[id number]. Topic: Clinical - Medication Refill >> Jan 22, 2024  5:29 PM DeAngela L wrote: Medication: ibuprofen  (ADVIL ) 800 MG tablet   Has the patient contacted their pharmacy? Yes  (Agent: If no, request that the patient contact the pharmacy for the refill. If patient does not wish to contact the pharmacy document the reason why and proceed with request.) (Agent: If yes, when and what did the pharmacy advise?)  This is the patient's preferred pharmacy:  [facility] - [place], [place] - [address] [place] [address] Phone: [phone] Fax: [phone]  Is this the correct pharmacy for this prescription? Yes  If no, delete pharmacy and type the correct one.   Has the prescription been filled recently? Yes   Is the patient out of the medication? Yes   Has the patient been seen for an appointment in the last year OR does the patient have an upcoming appointment?Yes  Can we respond through MyChart? Unknown   Agent: Please be advised that Rx refills may take up to 3 business days. We ask that you follow-up with your pharmacy.

## 2024-01-23 NOTE — Telephone Encounter (Signed)
 Closing encounter. Refill was sent on 01/22/23

## 2024-01-30 ENCOUNTER — Other Ambulatory Visit: Payer: Self-pay | Admitting: Family

## 2024-01-30 NOTE — Telephone Encounter (Signed)
 Copied from CRM [id number]. Topic: Clinical - Medication Refill >> Jan 30, 2024  9:19 AM Cherylann RAMAN wrote: Medication: ibuprofen  (ADVIL ) 800 MG tablet  Has the patient contacted their pharmacy? Yes (Agent: If no, request that the patient contact the pharmacy for the refill. If patient does not wish to contact the pharmacy document the reason why and proceed with request.) (Agent: If yes, when and what did the pharmacy advise?)  This is the patient's preferred pharmacy:  [facility] - [place], [place] - [address] [place] [address] Phone: [phone] Fax: [phone]  Is this the correct pharmacy for this prescription? Yes If no, delete pharmacy and type the correct one.   Has the prescription been filled recently? No  Is the patient out of the medication? Yes  Has the patient been seen for an appointment in the last year OR does the patient have an upcoming appointment? Yes  Can we respond through MyChart? Yes  Agent: Please be advised that Rx refills may take up to 3 business days. We ask that you follow-up with your pharmacy.

## 2024-02-14 ENCOUNTER — Other Ambulatory Visit: Payer: Self-pay | Admitting: Family

## 2024-02-14 DIAGNOSIS — M5416 Radiculopathy, lumbar region: Secondary | ICD-10-CM

## 2024-02-14 DIAGNOSIS — G894 Chronic pain syndrome: Secondary | ICD-10-CM

## 2024-03-13 ENCOUNTER — Ambulatory Visit: Payer: Self-pay | Admitting: Family

## 2024-05-29 ENCOUNTER — Ambulatory Visit: Admitting: Orthopaedic Surgery
